# Patient Record
Sex: Male | Born: 1935 | Race: White | Hispanic: No | Marital: Married | Smoking: Former smoker
Health system: Southern US, Community
[De-identification: ages and names within clinical notes are randomized; demographics above are authoritative.]

## PROBLEM LIST (undated history)

## (undated) DIAGNOSIS — I499 Cardiac arrhythmia, unspecified: Secondary | ICD-10-CM

## (undated) DIAGNOSIS — M353 Polymyalgia rheumatica: Secondary | ICD-10-CM

## (undated) DIAGNOSIS — I509 Heart failure, unspecified: Secondary | ICD-10-CM

## (undated) DIAGNOSIS — J449 Chronic obstructive pulmonary disease, unspecified: Secondary | ICD-10-CM

## (undated) DIAGNOSIS — I2510 Atherosclerotic heart disease of native coronary artery without angina pectoris: Secondary | ICD-10-CM

## (undated) DIAGNOSIS — M1990 Unspecified osteoarthritis, unspecified site: Secondary | ICD-10-CM

## (undated) DIAGNOSIS — I10 Essential (primary) hypertension: Secondary | ICD-10-CM

## (undated) DIAGNOSIS — I209 Angina pectoris, unspecified: Secondary | ICD-10-CM

## (undated) DIAGNOSIS — R0600 Dyspnea, unspecified: Secondary | ICD-10-CM

## (undated) DIAGNOSIS — M4800 Spinal stenosis, site unspecified: Secondary | ICD-10-CM

## (undated) DIAGNOSIS — D649 Anemia, unspecified: Secondary | ICD-10-CM

## (undated) DIAGNOSIS — N189 Chronic kidney disease, unspecified: Secondary | ICD-10-CM

## (undated) HISTORY — PX: TONSILLECTOMY: SUR1361

## (undated) HISTORY — PX: EYE SURGERY: SHX253

## (undated) HISTORY — DX: Cardiac arrhythmia, unspecified: I49.9

## (undated) HISTORY — PX: VASECTOMY: SHX75

---

## 2002-07-27 HISTORY — PX: JOINT REPLACEMENT: SHX530

## 2005-07-27 HISTORY — PX: JOINT REPLACEMENT: SHX530

## 2010-01-24 ENCOUNTER — Ambulatory Visit: Payer: Self-pay | Admitting: Orthopedic Surgery

## 2010-03-11 ENCOUNTER — Ambulatory Visit: Payer: Self-pay | Admitting: Orthopedic Surgery

## 2010-12-30 ENCOUNTER — Ambulatory Visit: Payer: Self-pay | Admitting: Gastroenterology

## 2010-12-31 LAB — PATHOLOGY REPORT

## 2011-09-12 ENCOUNTER — Emergency Department: Payer: Self-pay | Admitting: Emergency Medicine

## 2013-01-26 ENCOUNTER — Ambulatory Visit: Payer: Self-pay | Admitting: Orthopedic Surgery

## 2016-05-22 ENCOUNTER — Encounter
Admission: RE | Disposition: A | Discharge status: Home / Self Care | Payer: Self-pay | Source: Ambulatory Visit | Attending: Internal Medicine

## 2016-05-22 ENCOUNTER — Ambulatory Visit
Admission: RE | Admit: 2016-05-22 | Discharge: 2016-05-22 | Disposition: A | Discharge status: Home / Self Care | Payer: Medicare PPO | Source: Ambulatory Visit | Attending: Internal Medicine | Admitting: Internal Medicine

## 2016-05-22 ENCOUNTER — Ambulatory Visit: Admit: 2016-05-22 | Payer: Medicare PPO | Admitting: Internal Medicine

## 2016-05-22 DIAGNOSIS — Z7982 Long term (current) use of aspirin: Secondary | ICD-10-CM | POA: Insufficient documentation

## 2016-05-22 DIAGNOSIS — I2510 Atherosclerotic heart disease of native coronary artery without angina pectoris: Secondary | ICD-10-CM | POA: Diagnosis present

## 2016-05-22 DIAGNOSIS — I25110 Atherosclerotic heart disease of native coronary artery with unstable angina pectoris: Secondary | ICD-10-CM | POA: Insufficient documentation

## 2016-05-22 DIAGNOSIS — M353 Polymyalgia rheumatica: Secondary | ICD-10-CM | POA: Insufficient documentation

## 2016-05-22 DIAGNOSIS — E785 Hyperlipidemia, unspecified: Secondary | ICD-10-CM | POA: Insufficient documentation

## 2016-05-22 DIAGNOSIS — Z79899 Other long term (current) drug therapy: Secondary | ICD-10-CM | POA: Insufficient documentation

## 2016-05-22 DIAGNOSIS — Z96611 Presence of right artificial shoulder joint: Secondary | ICD-10-CM | POA: Diagnosis not present

## 2016-05-22 DIAGNOSIS — N189 Chronic kidney disease, unspecified: Secondary | ICD-10-CM | POA: Diagnosis not present

## 2016-05-22 DIAGNOSIS — Z8249 Family history of ischemic heart disease and other diseases of the circulatory system: Secondary | ICD-10-CM | POA: Diagnosis not present

## 2016-05-22 DIAGNOSIS — Z87891 Personal history of nicotine dependence: Secondary | ICD-10-CM | POA: Insufficient documentation

## 2016-05-22 DIAGNOSIS — I129 Hypertensive chronic kidney disease with stage 1 through stage 4 chronic kidney disease, or unspecified chronic kidney disease: Secondary | ICD-10-CM | POA: Diagnosis not present

## 2016-05-22 DIAGNOSIS — N400 Enlarged prostate without lower urinary tract symptoms: Secondary | ICD-10-CM | POA: Insufficient documentation

## 2016-05-22 HISTORY — DX: Chronic kidney disease, unspecified: N18.9

## 2016-05-22 HISTORY — DX: Angina pectoris, unspecified: I20.9

## 2016-05-22 HISTORY — PX: CARDIAC CATHETERIZATION: SHX172

## 2016-05-22 HISTORY — DX: Essential (primary) hypertension: I10

## 2016-05-22 HISTORY — DX: Atherosclerotic heart disease of native coronary artery without angina pectoris: I25.10

## 2016-05-22 LAB — CARDIAC CATHETERIZATION: Cath EF Quantitative: 60 %

## 2016-05-22 SURGERY — LEFT HEART CATH AND CORONARY ANGIOGRAPHY
Anesthesia: Moderate Sedation

## 2016-05-22 SURGERY — LEFT HEART CATH AND CORONARY ANGIOGRAPHY
Anesthesia: Moderate Sedation | Laterality: Left

## 2016-05-22 MED ORDER — SODIUM CHLORIDE 0.9 % IV SOLN: 250.0000 mL | INTRAVENOUS | Status: DC | PRN

## 2016-05-22 MED ORDER — ASPIRIN 81 MG PO CHEW: 81.0000 mg | CHEWABLE_TABLET | ORAL | Status: DC

## 2016-05-22 MED ORDER — SODIUM CHLORIDE 0.9 % WEIGHT BASED INFUSION: 3.0000 mL/kg/h | INTRAVENOUS | Status: DC

## 2016-05-22 MED ORDER — MIDAZOLAM HCL 2 MG/2ML IJ SOLN
INTRAMUSCULAR | Status: AC
  Filled 2016-05-22: qty 2

## 2016-05-22 MED ORDER — FENTANYL CITRATE (PF) 100 MCG/2ML IJ SOLN
INTRAMUSCULAR | Status: AC
  Filled 2016-05-22: qty 2

## 2016-05-22 MED ORDER — SODIUM CHLORIDE 0.9% FLUSH: 3.0000 mL | INTRAVENOUS | Status: DC | PRN

## 2016-05-22 MED ORDER — MIDAZOLAM HCL 2 MG/2ML IJ SOLN
INTRAMUSCULAR | Status: DC | PRN
  Administered 2016-05-22: 1 mg via INTRAVENOUS

## 2016-05-22 MED ORDER — ACETAMINOPHEN 325 MG PO TABS: 650.0000 mg | ORAL_TABLET | ORAL | Status: DC | PRN

## 2016-05-22 MED ORDER — ONDANSETRON HCL 4 MG/2ML IJ SOLN: 4.0000 mg | Freq: Four times a day (QID) | INTRAMUSCULAR | Status: DC | PRN

## 2016-05-22 MED ORDER — IOPAMIDOL (ISOVUE-300) INJECTION 61%
INTRAVENOUS | Status: DC | PRN
  Administered 2016-05-22: 130 mL via INTRA_ARTERIAL

## 2016-05-22 MED ORDER — HEPARIN (PORCINE) IN NACL 2-0.9 UNIT/ML-% IJ SOLN
INTRAMUSCULAR | Status: AC
Start: 2016-05-22 — End: 2016-05-22
  Filled 2016-05-22: qty 500

## 2016-05-22 MED ORDER — SODIUM CHLORIDE 0.9% FLUSH: 3.0000 mL | Freq: Two times a day (BID) | INTRAVENOUS | Status: DC

## 2016-05-22 MED ORDER — FENTANYL CITRATE (PF) 100 MCG/2ML IJ SOLN
INTRAMUSCULAR | Status: DC | PRN
  Administered 2016-05-22: 50 ug via INTRAVENOUS

## 2016-05-22 MED ORDER — SODIUM CHLORIDE 0.9 % WEIGHT BASED INFUSION: 1.0000 mL/kg/h | INTRAVENOUS | Status: DC

## 2016-05-22 SURGICAL SUPPLY — 10 items
CATH INFINITI 5FR ANG PIGTAIL (CATHETERS) ×3 IMPLANT
CATH INFINITI 5FR JL4 (CATHETERS) ×3 IMPLANT
CATH INFINITI JR4 5F (CATHETERS) ×3 IMPLANT
DEVICE CLOSURE MYNXGRIP 5F (Vascular Products) ×3 IMPLANT
DEVICE SAFEGUARD 24CM (GAUZE/BANDAGES/DRESSINGS) ×3 IMPLANT
KIT MANI 3VAL PERCEP (MISCELLANEOUS) ×3 IMPLANT
NEEDLE PERC 18GX7CM (NEEDLE) ×3 IMPLANT
PACK CARDIAC CATH (CUSTOM PROCEDURE TRAY) ×3 IMPLANT
SHEATH AVANTI 5FR X 11CM (SHEATH) ×3 IMPLANT
WIRE EMERALD 3MM-J .035X150CM (WIRE) ×3 IMPLANT

## 2016-05-22 NOTE — Discharge Instructions (Signed)

## 2016-05-25 ENCOUNTER — Encounter: Payer: Self-pay | Admitting: Internal Medicine

## 2016-06-16 ENCOUNTER — Other Ambulatory Visit: Payer: Self-pay | Admitting: Family Medicine

## 2016-06-16 DIAGNOSIS — R0789 Other chest pain: Secondary | ICD-10-CM

## 2016-06-30 ENCOUNTER — Ambulatory Visit: Payer: Medicare PPO

## 2017-07-12 ENCOUNTER — Other Ambulatory Visit: Payer: Self-pay | Admitting: Orthopedic Surgery

## 2017-07-12 DIAGNOSIS — S86892A Other injury of other muscle(s) and tendon(s) at lower leg level, left leg, initial encounter: Secondary | ICD-10-CM

## 2017-07-13 ENCOUNTER — Ambulatory Visit
Admission: RE | Admit: 2017-07-13 | Discharge: 2017-07-13 | Disposition: A | Discharge status: Home / Self Care | Payer: Medicare PPO | Source: Ambulatory Visit | Attending: Orthopedic Surgery | Admitting: Orthopedic Surgery

## 2017-07-13 DIAGNOSIS — S86892A Other injury of other muscle(s) and tendon(s) at lower leg level, left leg, initial encounter: Secondary | ICD-10-CM | POA: Diagnosis not present

## 2017-07-13 DIAGNOSIS — X58XXXA Exposure to other specified factors, initial encounter: Secondary | ICD-10-CM | POA: Insufficient documentation

## 2017-07-13 DIAGNOSIS — M25462 Effusion, left knee: Secondary | ICD-10-CM | POA: Diagnosis not present

## 2017-07-13 DIAGNOSIS — S76112A Strain of left quadriceps muscle, fascia and tendon, initial encounter: Secondary | ICD-10-CM | POA: Diagnosis not present

## 2017-07-13 DIAGNOSIS — M25562 Pain in left knee: Secondary | ICD-10-CM | POA: Diagnosis present

## 2017-07-14 ENCOUNTER — Other Ambulatory Visit: Payer: Self-pay

## 2017-07-14 ENCOUNTER — Encounter
Admission: RE | Admit: 2017-07-14 | Discharge: 2017-07-14 | Disposition: A | Discharge status: Home / Self Care | Payer: Medicare PPO | Source: Ambulatory Visit | Attending: Orthopedic Surgery | Admitting: Orthopedic Surgery

## 2017-07-14 DIAGNOSIS — G4730 Sleep apnea, unspecified: Secondary | ICD-10-CM | POA: Diagnosis not present

## 2017-07-14 DIAGNOSIS — G4700 Insomnia, unspecified: Secondary | ICD-10-CM | POA: Diagnosis not present

## 2017-07-14 DIAGNOSIS — Z79899 Other long term (current) drug therapy: Secondary | ICD-10-CM | POA: Diagnosis not present

## 2017-07-14 DIAGNOSIS — M1990 Unspecified osteoarthritis, unspecified site: Secondary | ICD-10-CM | POA: Diagnosis not present

## 2017-07-14 DIAGNOSIS — Z87891 Personal history of nicotine dependence: Secondary | ICD-10-CM | POA: Diagnosis not present

## 2017-07-14 DIAGNOSIS — N189 Chronic kidney disease, unspecified: Secondary | ICD-10-CM | POA: Diagnosis not present

## 2017-07-14 DIAGNOSIS — I509 Heart failure, unspecified: Secondary | ICD-10-CM | POA: Diagnosis not present

## 2017-07-14 DIAGNOSIS — Z7982 Long term (current) use of aspirin: Secondary | ICD-10-CM | POA: Diagnosis not present

## 2017-07-14 DIAGNOSIS — S76112A Strain of left quadriceps muscle, fascia and tendon, initial encounter: Secondary | ICD-10-CM | POA: Diagnosis present

## 2017-07-14 DIAGNOSIS — J449 Chronic obstructive pulmonary disease, unspecified: Secondary | ICD-10-CM | POA: Diagnosis not present

## 2017-07-14 DIAGNOSIS — E785 Hyperlipidemia, unspecified: Secondary | ICD-10-CM | POA: Diagnosis not present

## 2017-07-14 DIAGNOSIS — M353 Polymyalgia rheumatica: Secondary | ICD-10-CM | POA: Diagnosis not present

## 2017-07-14 DIAGNOSIS — I2510 Atherosclerotic heart disease of native coronary artery without angina pectoris: Secondary | ICD-10-CM | POA: Diagnosis not present

## 2017-07-14 DIAGNOSIS — N400 Enlarged prostate without lower urinary tract symptoms: Secondary | ICD-10-CM | POA: Diagnosis not present

## 2017-07-14 DIAGNOSIS — I130 Hypertensive heart and chronic kidney disease with heart failure and stage 1 through stage 4 chronic kidney disease, or unspecified chronic kidney disease: Secondary | ICD-10-CM | POA: Diagnosis not present

## 2017-07-14 DIAGNOSIS — W1809XA Striking against other object with subsequent fall, initial encounter: Secondary | ICD-10-CM | POA: Diagnosis not present

## 2017-07-14 DIAGNOSIS — Z888 Allergy status to other drugs, medicaments and biological substances status: Secondary | ICD-10-CM | POA: Diagnosis not present

## 2017-07-14 HISTORY — DX: Dyspnea, unspecified: R06.00

## 2017-07-14 HISTORY — DX: Heart failure, unspecified: I50.9

## 2017-07-14 HISTORY — DX: Unspecified osteoarthritis, unspecified site: M19.90

## 2017-07-14 HISTORY — DX: Anemia, unspecified: D64.9

## 2017-07-14 LAB — DIFFERENTIAL
BASOS PCT: 0 %
Basophils Absolute: 0 10*3/uL (ref 0–0.1)
EOS PCT: 0 %
Eosinophils Absolute: 0 10*3/uL (ref 0–0.7)
Lymphocytes Relative: 7 %
Lymphs Abs: 0.7 10*3/uL — ABNORMAL LOW (ref 1.0–3.6)
MONOS PCT: 5 %
Monocytes Absolute: 0.5 10*3/uL (ref 0.2–1.0)
Neutro Abs: 8.7 10*3/uL — ABNORMAL HIGH (ref 1.4–6.5)
Neutrophils Relative %: 88 %

## 2017-07-14 LAB — BASIC METABOLIC PANEL
Anion gap: 8 (ref 5–15)
BUN: 27 mg/dL — AB (ref 6–20)
CHLORIDE: 105 mmol/L (ref 101–111)
CO2: 25 mmol/L (ref 22–32)
CREATININE: 1.36 mg/dL — AB (ref 0.61–1.24)
Calcium: 9.3 mg/dL (ref 8.9–10.3)
GFR calc Af Amer: 52 mL/min — ABNORMAL LOW (ref >60)
GFR, EST NON AFRICAN AMERICAN: 45 mL/min — AB (ref >60)
GLUCOSE: 113 mg/dL — AB (ref 65–99)
Potassium: 4.8 mmol/L (ref 3.5–5.1)
SODIUM: 138 mmol/L (ref 135–145)

## 2017-07-14 LAB — CBC
HEMATOCRIT: 39.3 % — AB (ref 40.0–52.0)
Hemoglobin: 13.2 g/dL (ref 13.0–18.0)
MCH: 32 pg (ref 26.0–34.0)
MCHC: 33.7 g/dL (ref 32.0–36.0)
MCV: 94.9 fL (ref 80.0–100.0)
PLATELETS: 241 10*3/uL (ref 150–440)
RBC: 4.14 MIL/uL — ABNORMAL LOW (ref 4.40–5.90)
RDW: 13.8 % (ref 11.5–14.5)
WBC: 9.9 10*3/uL (ref 3.8–10.6)

## 2017-07-14 NOTE — Patient Instructions (Signed)
Your procedure is scheduled on: July 15, 2017 (Thursday ) Report to Same  Day Surgery. [facility] ) Second Floor ARRIVAL TIME 3:00 pm  Remember: Instructions that are not followed completely may result in serious medical risk, up to and including death, or upon the discretion of your surgeon and anesthesiologist your surgery may need to be rescheduled.     _X__ 1. Do not eat food after midnight the night before your procedure.                 No gum chewing or hard candies. You may drink clear liquids up to 2 hours                 before you are scheduled to arrive for your surgery- DO not drink clear                 liquids within 2 hours of the start of your surgery.                 Clear Liquids include:  water, apple juice without pulp, clear carbohydrate                 drink such as Clearfast of Gartorade, Black Coffee or Tea (Do not add                 anything to coffee or tea).     _X__ 2.  No Alcohol for 24 hours before or after surgery.   _X__ 3.  Do Not Smoke or use e-cigarettes For 24 Hours Prior to Your Surgery.                 Do not use any chewable tobacco products for at least 6 hours prior to                 surgery.  ____  4.  Bring all medications with you on the day of surgery if instructed.   __x__  5.  Notify your doctor if there is any change in your medical condition      (cold, fever, infections).     Do not wear jewelry, make-up, hairpins, clips or nail polish. Do not wear lotions, powders, or perfumes.  Do not shave 48 hours prior to surgery. Men may shave face and neck. Do not bring valuables to the hospital.    [facility] any belongings or valuables.  Contacts, dentures or bridgework may not be worn into surgery. Leave your suitcase in the car. After surgery it may be brought to your room. For patients admitted to the hospital, discharge time is determined by your treatment team.   Patients  discharged the day of surgery will not be allowed to drive home.   Please read over the following fact sheets that you were given:             __x__ Take these medicines the morning of surgery with A SIP OF WATER:    1. AMLODIPINE  2.   3.   4.  5.  6.  ____ Fleet Enema (as directed)   _X___ Use CHG Soap as directed  ____ Use inhalers on the day of surgery  ____ Stop metformin 2 days prior to surgery    ____ Take 1/2 of usual insulin dose the night before surgery. No insulin the morning          of surgery.   __X__ Stop Coumadin/Plavix/aspirin on (STOP ASPIRIN  TODAY )  __X__ Stop Anti-inflammatories on (NO ASPIRIN PRODUCTS )    __X__ Stop supplements until after surgery.  (STOP RED YEAST RICE TODAY )  ____ Bring C-Pap to the hospital.

## 2017-07-15 ENCOUNTER — Encounter
Admission: RE | Disposition: A | Discharge status: Home / Self Care | Payer: Self-pay | Source: Ambulatory Visit | Attending: Orthopedic Surgery

## 2017-07-15 ENCOUNTER — Ambulatory Visit: Payer: Medicare PPO | Admitting: Anesthesiology

## 2017-07-15 ENCOUNTER — Ambulatory Visit
Admission: RE | Admit: 2017-07-15 | Discharge: 2017-07-15 | Disposition: A | Discharge status: Home / Self Care | Payer: Medicare PPO | Source: Ambulatory Visit | Attending: Orthopedic Surgery | Admitting: Orthopedic Surgery

## 2017-07-15 ENCOUNTER — Encounter: Payer: Self-pay | Admitting: Anesthesiology

## 2017-07-15 DIAGNOSIS — M353 Polymyalgia rheumatica: Secondary | ICD-10-CM | POA: Insufficient documentation

## 2017-07-15 DIAGNOSIS — Z87891 Personal history of nicotine dependence: Secondary | ICD-10-CM | POA: Insufficient documentation

## 2017-07-15 DIAGNOSIS — M1990 Unspecified osteoarthritis, unspecified site: Secondary | ICD-10-CM | POA: Insufficient documentation

## 2017-07-15 DIAGNOSIS — Z7982 Long term (current) use of aspirin: Secondary | ICD-10-CM | POA: Insufficient documentation

## 2017-07-15 DIAGNOSIS — Z79899 Other long term (current) drug therapy: Secondary | ICD-10-CM | POA: Insufficient documentation

## 2017-07-15 DIAGNOSIS — W1809XA Striking against other object with subsequent fall, initial encounter: Secondary | ICD-10-CM | POA: Insufficient documentation

## 2017-07-15 DIAGNOSIS — N189 Chronic kidney disease, unspecified: Secondary | ICD-10-CM | POA: Insufficient documentation

## 2017-07-15 DIAGNOSIS — N400 Enlarged prostate without lower urinary tract symptoms: Secondary | ICD-10-CM | POA: Insufficient documentation

## 2017-07-15 DIAGNOSIS — E785 Hyperlipidemia, unspecified: Secondary | ICD-10-CM | POA: Insufficient documentation

## 2017-07-15 DIAGNOSIS — I509 Heart failure, unspecified: Secondary | ICD-10-CM | POA: Insufficient documentation

## 2017-07-15 DIAGNOSIS — S76112A Strain of left quadriceps muscle, fascia and tendon, initial encounter: Secondary | ICD-10-CM | POA: Diagnosis not present

## 2017-07-15 DIAGNOSIS — I130 Hypertensive heart and chronic kidney disease with heart failure and stage 1 through stage 4 chronic kidney disease, or unspecified chronic kidney disease: Secondary | ICD-10-CM | POA: Insufficient documentation

## 2017-07-15 DIAGNOSIS — Z888 Allergy status to other drugs, medicaments and biological substances status: Secondary | ICD-10-CM | POA: Insufficient documentation

## 2017-07-15 DIAGNOSIS — G4700 Insomnia, unspecified: Secondary | ICD-10-CM | POA: Insufficient documentation

## 2017-07-15 DIAGNOSIS — G4730 Sleep apnea, unspecified: Secondary | ICD-10-CM | POA: Insufficient documentation

## 2017-07-15 DIAGNOSIS — J449 Chronic obstructive pulmonary disease, unspecified: Secondary | ICD-10-CM | POA: Insufficient documentation

## 2017-07-15 DIAGNOSIS — I2510 Atherosclerotic heart disease of native coronary artery without angina pectoris: Secondary | ICD-10-CM | POA: Insufficient documentation

## 2017-07-15 HISTORY — PX: PATELLAR TENDON REPAIR: SHX737

## 2017-07-15 HISTORY — PX: QUADRICEPS TENDON REPAIR: SHX756

## 2017-07-15 SURGERY — REPAIR, TENDON, PATELLAR
Anesthesia: General | Laterality: Left

## 2017-07-15 MED ORDER — FENTANYL CITRATE (PF) 100 MCG/2ML IJ SOLN
INTRAMUSCULAR | Status: AC
  Administered 2017-07-15: 25 ug via INTRAVENOUS
  Filled 2017-07-15: qty 2

## 2017-07-15 MED ORDER — LIDOCAINE HCL (PF) 2 % IJ SOLN
INTRAMUSCULAR | Status: AC
  Filled 2017-07-15: qty 10

## 2017-07-15 MED ORDER — METHYLPREDNISOLONE SODIUM SUCC 125 MG IJ SOLR
40.0000 mg | Freq: Once | INTRAMUSCULAR | Status: AC
  Administered 2017-07-15: 40 mg via INTRAVENOUS

## 2017-07-15 MED ORDER — ONDANSETRON HCL 4 MG/2ML IJ SOLN: 4.0000 mg | Freq: Once | INTRAMUSCULAR | Status: DC | PRN

## 2017-07-15 MED ORDER — ACETAMINOPHEN 10 MG/ML IV SOLN
INTRAVENOUS | Status: AC
  Filled 2017-07-15: qty 100

## 2017-07-15 MED ORDER — BUPIVACAINE HCL (PF) 0.5 % IJ SOLN
INTRAMUSCULAR | Status: AC
Start: 2017-07-15 — End: 2017-07-15
  Filled 2017-07-15: qty 30

## 2017-07-15 MED ORDER — HYDROCODONE-ACETAMINOPHEN 5-325 MG PO TABS: 1.0000 | ORAL_TABLET | Freq: Four times a day (QID) | ORAL | 0 refills | Status: DC | PRN

## 2017-07-15 MED ORDER — NEOMYCIN-POLYMYXIN B GU 40-200000 IR SOLN
Status: DC | PRN
  Administered 2017-07-15: 4 mL

## 2017-07-15 MED ORDER — CEFAZOLIN SODIUM-DEXTROSE 2-4 GM/100ML-% IV SOLN
INTRAVENOUS | Status: AC
  Filled 2017-07-15: qty 100

## 2017-07-15 MED ORDER — BUPIVACAINE HCL (PF) 0.5 % IJ SOLN
INTRAMUSCULAR | Status: DC | PRN
  Administered 2017-07-15: 30 mL

## 2017-07-15 MED ORDER — PROPOFOL 10 MG/ML IV BOLUS
INTRAVENOUS | Status: DC | PRN
  Administered 2017-07-15: 120 mg via INTRAVENOUS

## 2017-07-15 MED ORDER — PROPOFOL 10 MG/ML IV BOLUS
INTRAVENOUS | Status: AC
  Filled 2017-07-15: qty 20

## 2017-07-15 MED ORDER — METHYLPREDNISOLONE SODIUM SUCC 125 MG IJ SOLR
INTRAMUSCULAR | Status: AC
  Filled 2017-07-15: qty 2

## 2017-07-15 MED ORDER — CEFAZOLIN SODIUM-DEXTROSE 2-4 GM/100ML-% IV SOLN
2.0000 g | Freq: Once | INTRAVENOUS | Status: AC
  Administered 2017-07-15: 2 g via INTRAVENOUS

## 2017-07-15 MED ORDER — FENTANYL CITRATE (PF) 100 MCG/2ML IJ SOLN
25.0000 ug | INTRAMUSCULAR | Status: AC | PRN
  Administered 2017-07-15 (×6): 25 ug via INTRAVENOUS

## 2017-07-15 MED ORDER — ONDANSETRON HCL 4 MG/2ML IJ SOLN
INTRAMUSCULAR | Status: AC
  Filled 2017-07-15: qty 2

## 2017-07-15 MED ORDER — LIDOCAINE HCL (CARDIAC) 20 MG/ML IV SOLN
INTRAVENOUS | Status: DC | PRN
  Administered 2017-07-15: 100 mg via INTRAVENOUS

## 2017-07-15 MED ORDER — ONDANSETRON HCL 4 MG/2ML IJ SOLN
INTRAMUSCULAR | Status: DC | PRN
  Administered 2017-07-15: 4 mg via INTRAVENOUS

## 2017-07-15 MED ORDER — NEOMYCIN-POLYMYXIN B GU 40-200000 IR SOLN
Status: AC
  Filled 2017-07-15: qty 4

## 2017-07-15 MED ORDER — FAMOTIDINE 20 MG PO TABS
ORAL_TABLET | ORAL | Status: AC
  Administered 2017-07-15: 20 mg via ORAL
  Filled 2017-07-15: qty 1

## 2017-07-15 MED ORDER — FENTANYL CITRATE (PF) 100 MCG/2ML IJ SOLN
INTRAMUSCULAR | Status: DC | PRN
  Administered 2017-07-15: 25 ug via INTRAVENOUS
  Administered 2017-07-15: 50 ug via INTRAVENOUS
  Administered 2017-07-15: 25 ug via INTRAVENOUS

## 2017-07-15 MED ORDER — FAMOTIDINE 20 MG PO TABS
20.0000 mg | ORAL_TABLET | Freq: Once | ORAL | Status: AC
  Administered 2017-07-15: 20 mg via ORAL

## 2017-07-15 MED ORDER — LACTATED RINGERS IV SOLN
INTRAVENOUS | Status: DC
  Administered 2017-07-15 (×2): via INTRAVENOUS

## 2017-07-15 MED ORDER — ACETAMINOPHEN 10 MG/ML IV SOLN
INTRAVENOUS | Status: DC | PRN
  Administered 2017-07-15: 1000 mg via INTRAVENOUS

## 2017-07-15 MED ORDER — PHENYLEPHRINE HCL 10 MG/ML IJ SOLN
INTRAMUSCULAR | Status: DC | PRN
  Administered 2017-07-15: 100 ug via INTRAVENOUS

## 2017-07-15 MED ORDER — FENTANYL CITRATE (PF) 100 MCG/2ML IJ SOLN
INTRAMUSCULAR | Status: AC
  Filled 2017-07-15: qty 2

## 2017-07-15 SURGICAL SUPPLY — 38 items
ANCHOR SUT BIO SW 4.75X19.1 (Anchor) ×9 IMPLANT
BANDAGE ELASTIC 6 LF NS (GAUZE/BANDAGES/DRESSINGS) ×3 IMPLANT
CANISTER SUCT 1200ML W/VALVE (MISCELLANEOUS) ×3 IMPLANT
CAST PADDING 6X4YD ST 30248 (SOFTGOODS) ×2
CHLORAPREP W/TINT 26ML (MISCELLANEOUS) ×3 IMPLANT
CUFF TOURN 24 STER (MISCELLANEOUS) IMPLANT
CUFF TOURN 30 STER DUAL PORT (MISCELLANEOUS) ×3 IMPLANT
ELECT CAUTERY BLADE 6.4 (BLADE) ×3 IMPLANT
ELECT REM PT RETURN 9FT ADLT (ELECTROSURGICAL) ×3
ELECTRODE REM PT RTRN 9FT ADLT (ELECTROSURGICAL) ×1 IMPLANT
GAUZE PETRO XEROFOAM 1X8 (MISCELLANEOUS) ×6 IMPLANT
GAUZE SPONGE 4X4 12PLY STRL (GAUZE/BANDAGES/DRESSINGS) ×3 IMPLANT
GLOVE SURG SYN 9.0  PF PI (GLOVE) ×2
GLOVE SURG SYN 9.0 PF PI (GLOVE) ×1 IMPLANT
GOWN SRG 2XL LVL 4 RGLN SLV (GOWNS) ×1 IMPLANT
GOWN STRL NON-REIN 2XL LVL4 (GOWNS) ×2
GOWN STRL REUS W/ TWL LRG LVL3 (GOWN DISPOSABLE) ×1 IMPLANT
GOWN STRL REUS W/TWL LRG LVL3 (GOWN DISPOSABLE) ×2
IMMOB KNEE 24 THIGH 24 443303 (SOFTGOODS) IMPLANT
KIT RM TURNOVER STRD PROC AR (KITS) ×3 IMPLANT
NS IRRIG 1000ML POUR BTL (IV SOLUTION) ×3 IMPLANT
NS IRRIG 500ML POUR BTL (IV SOLUTION) ×3 IMPLANT
PACK EXTREMITY ARMC (MISCELLANEOUS) ×3 IMPLANT
PACK TOTAL KNEE (MISCELLANEOUS) ×3 IMPLANT
PAD ABD DERMACEA PRESS 5X9 (GAUZE/BANDAGES/DRESSINGS) ×6 IMPLANT
PADDING CAST COTTON 6X4 ST (SOFTGOODS) ×1 IMPLANT
PASSER SUT SWANSON 36MM LOOP (INSTRUMENTS) ×3 IMPLANT
STAPLER SKIN PROX 35W (STAPLE) ×3 IMPLANT
SUT FIBERWIRE #2 38 T-5 BLUE (SUTURE) ×9
SUT VIC AB 0 CT1 27 (SUTURE) ×2
SUT VIC AB 0 CT1 27XCR 8 STRN (SUTURE) ×1 IMPLANT
SUT VIC AB 0 CT1 36 (SUTURE) ×3 IMPLANT
SUT VIC AB 2-0 CT1 27 (SUTURE) ×2
SUT VIC AB 2-0 CT1 TAPERPNT 27 (SUTURE) ×1 IMPLANT
SUTURE FIBERWR #2 38 T-5 BLUE (SUTURE) ×3 IMPLANT
SYR 10ML LL (SYRINGE) ×3 IMPLANT
SYS INTERNAL BRACE KNEE (Miscellaneous) ×3 IMPLANT
SYSTEM INTERNAL BRACE KNEE (Miscellaneous) ×1 IMPLANT

## 2017-07-15 NOTE — H&P (Signed)
Reviewed paper H+P, will be scanned into chart. No changes noted.  

## 2017-07-15 NOTE — Discharge Instructions (Addendum)
AMBULATORY SURGERY  DISCHARGE INSTRUCTIONS   1) The drugs that you were given will stay in your system until tomorrow so for the next 24 hours you should not:  A) Drive an automobile B) Make any legal decisions C) Drink any alcoholic beverage   2) You may resume regular meals tomorrow.  Today it is better to start with liquids and gradually work up to solid foods.  You may eat anything you prefer, but it is better to start with liquids, then soup and crackers, and gradually work up to solid foods.   3) Please notify your doctor immediately if you have any unusual bleeding, trouble breathing, redness and pain at the surgery site, drainage, fever, or pain not relieved by medication. 4)   5) Your post-operative visit with Dr.                                     is: Date:                        Time:    Please call to schedule your post-operative visit.  6) Additional Instructions:     Resume home medicines tonight. Pain medicine as directed. Weightbearing as tolerated with immobilizer on. Okay to loosen immobilizer when you are resting with her leg out straight always have it on when getting up and do not been the knee at this time.

## 2017-07-15 NOTE — Transfer of Care (Signed)
Immediate Anesthesia Transfer of Care Note  Patient: Caleb Khan  Procedure(s) Performed: PATELLA TENDON REPAIR (Left ) REPAIR QUADRICEP TENDON (Left )  Patient Location: PACU  Anesthesia Type:General  Level of Consciousness: sedated  Airway & Oxygen Therapy: Patient Spontanous Breathing and Patient connected to face mask oxygen  Post-op Assessment: Report given to RN  Post vital signs: Reviewed and stable  Last Vitals:  Vitals:   07/15/17 1424 07/15/17 1645  BP: (!) 184/70 112/62  Pulse: 67 (!) 50  Resp: 17 13  Temp: 36.6 C 37.1 C  SpO2: 98% 98%    Last Pain:  Vitals:   07/15/17 1424  TempSrc: Tympanic      Patients Stated Pain Goal: 2 (07/15/17 1424)  Complications: No apparent anesthesia complications

## 2017-07-15 NOTE — Op Note (Signed)
07/15/2017  4:51 PM  PATIENT:  Caleb Khan  83 y.o. male  PRE-OPERATIVE DIAGNOSIS:  PATELLAR TENDON and quadriceps tendon ruptures left knee  POST-OPERATIVE DIAGNOSIS:  PATELLAR TENDON and quadriceps tendon ruptures left knee  PROCEDURE:  Procedure(s): PATELLA TENDON REPAIR (Left) REPAIR QUADRICEP TENDON (Left)  SURGEON: Leitha SchullerMichael J Jaquane Boughner, MD  ASSISTANTS: None  ANESTHESIA:   general  EBL:  Total I/O In: 900 [I.V.:900] Out: 50 [Blood:50]  BLOOD ADMINISTERED:none  DRAINS: none   LOCAL MEDICATIONS USED:  MARCAINE     SPECIMEN:  No Specimen  DISPOSITION OF SPECIMEN:  N/A  COUNTS:  YES  TOURNIQUET:  * Missing tourniquet times found for documented tourniquets in log: [id number] *  IMPLANTS: Multiple swivel lock bone anchors with suture  DICTATION: .Dragon Dictation patient was brought to the operating room and after adequate anesthesia was obtained the left leg was prepped and draped in sterile fashion. After patient identification and timeout procedures were completed a midline skin incision was made and there was a tear of the patella tendon with communication with the joint as well as a quadriceps tendon rupture of almost the entire quadriceps tendon. The sepsis insertion had a few fibers left on the bone and these were debrided to roughen the bone surface for subsequent repair. #2 FiberWire was then placed in a Krakw type suture medially and laterally into suture anchors were placed drilling tapping and then placing the swivel lock anchors. The quadriceps back and community into contact with the proximal patella. Following this examination the patella tendon revealed laterally rupture with exposed bone and this was repaired with a single suture anchor in the inferior lateral portion of the patella drilling tapping and the placed the swivel lock with suture woven through the patellar tendon. For additional fixation and stability and internal brace using a fiber tape was sutured  proximally in the quadriceps tendon and then the 2 tendons brought laterally around the patella and 2 suture anchors were then placed in the proximal tibia drilling and then tapping them placing the sutures into the bone with the swivel locks this gave additional fixation and help protect both quadriceps and patellar tendon repairs the knee could be flexed to 90 without difficulty and all sutures remain in place this point sutures were cut short so for the proximal sutures which was woven through some of the more superficial tendon to cover the defect from where the ruptured been. The wound was then thoroughly irrigated and infiltrated with 30 cc of Sensorcaine for postop analgesia. 2-0 Vicryl subcutaneously followed by skin staples to close the skin followed by dressing of Xeroform 4 x 4's ABDs and web roll Ace wrap and knee immobilizer  PLAN OF CARE: Discharge to home after PACU  PATIENT DISPOSITION:  PACU - hemodynamically stable.

## 2017-07-15 NOTE — Anesthesia Preprocedure Evaluation (Addendum)
Anesthesia Evaluation  Patient identified by MRN, date of birth, ID band Patient awake    Airway Mallampati: II       Dental   Pulmonary shortness of breath and with exertion, former smoker,           Cardiovascular hypertension, Pt. on medications + angina with exertion + CAD and +CHF       Neuro/Psych negative neurological ROS     GI/Hepatic negative GI ROS, Neg liver ROS,   Endo/Other  negative endocrine ROS  Renal/GU Renal disease  negative genitourinary   Musculoskeletal  (+) Arthritis , Osteoarthritis,    Abdominal   Peds negative pediatric ROS (+)  Hematology  (+) anemia ,   Anesthesia Other Findings Past Medical History: No date: Anemia No date: Anginal pain (HCC) No date: Arthritis     Comment:  osteoarthritis No date: CHF (congestive heart failure) (HCC)  Polymyalgia rheumatica EF 60% No date: Chronic kidney disease No date: Coronary artery disease No date: Dyspnea     Comment:  with exertion No date: Hypertension  Reproductive/Obstetrics                           Anesthesia Physical Anesthesia Plan  ASA: III  Anesthesia Plan: General   Post-op Pain Management:    Induction: Intravenous  PONV Risk Score and Plan:   Airway Management Planned: LMA  Additional Equipment:   Intra-op Plan:   Post-operative Plan:   Informed Consent: I have reviewed the patients History and Physical, chart, labs and discussed the procedure including the risks, benefits and alternatives for the proposed anesthesia with the patient or authorized representative who has indicated his/her understanding and acceptance.   Dental advisory given  Plan Discussed with: CRNA and Surgeon  Anesthesia Plan Comments:        Anesthesia Quick Evaluation

## 2017-07-15 NOTE — Anesthesia Post-op Follow-up Note (Signed)
Anesthesia QCDR form completed.        

## 2017-07-15 NOTE — Anesthesia Procedure Notes (Signed)
Procedure Name: LMA Insertion Date/Time: 07/15/2017 3:34 PM Performed by: Junious SilkNoles, Benoit Meech, CRNA Pre-anesthesia Checklist: Patient identified, Patient being monitored, Timeout performed, Emergency Drugs available and Suction available Patient Re-evaluated:Patient Re-evaluated prior to induction Oxygen Delivery Method: Circle system utilized Preoxygenation: Pre-oxygenation with 100% oxygen Induction Type: IV induction Ventilation: Mask ventilation without difficulty LMA: LMA inserted LMA Size: 4.0 Tube type: Oral Number of attempts: 1 Placement Confirmation: positive ETCO2 and breath sounds checked- equal and bilateral Tube secured with: Tape Dental Injury: Teeth and Oropharynx as per pre-operative assessment

## 2017-07-15 NOTE — Anesthesia Postprocedure Evaluation (Signed)
Anesthesia Post Note  Patient: Caleb Khan  Procedure(s) Performed: PATELLA TENDON REPAIR (Left ) REPAIR QUADRICEP TENDON (Left )  Patient location during evaluation: PACU Anesthesia Type: General Level of consciousness: awake and alert Pain management: pain level controlled Vital Signs Assessment: post-procedure vital signs reviewed and stable Respiratory status: spontaneous breathing and respiratory function stable Cardiovascular status: stable Anesthetic complications: no     Last Vitals:  Vitals:   07/15/17 1823 07/15/17 1824  BP: 136/72   Pulse: 64   Resp: 16   Temp:    SpO2: 95% 98%    Last Pain:  Vitals:   07/15/17 1823  TempSrc:   PainSc: 2                  KEPHART,WILLIAM K

## 2017-07-16 ENCOUNTER — Encounter: Payer: Self-pay | Admitting: Orthopedic Surgery

## 2017-11-30 ENCOUNTER — Other Ambulatory Visit: Payer: Self-pay

## 2017-11-30 ENCOUNTER — Encounter: Payer: Medicare PPO | Attending: Specialist

## 2017-11-30 VITALS — Ht 65.5 in | Wt 161.7 lb

## 2017-11-30 DIAGNOSIS — J449 Chronic obstructive pulmonary disease, unspecified: Secondary | ICD-10-CM | POA: Diagnosis present

## 2017-11-30 NOTE — Progress Notes (Signed)
Pulmonary Individual Treatment Plan  Patient Details  Name: Caleb Khan MRN: [id number] Date of Birth: [birth year] Referring Provider:     Pulmonary Rehab from 11/30/2017 in [facility] Cardiac and Pulmonary Rehab  Referring Provider  [name]      Initial Encounter Date:    Pulmonary Rehab from 11/30/2017 in [facility] Cardiac and Pulmonary Rehab  Date  11/30/17  Referring Provider  [name]      Visit Diagnosis: Chronic obstructive pulmonary disease, unspecified COPD type ([place])  Patient's Home Medications on Admission:  Current Outpatient Medications:  .  amLODipine (NORVASC) 5 MG tablet, Take 5 mg by mouth 2 (two) times daily. , Disp: , Rfl:  .  aspirin EC 81 MG tablet, Take 81 mg by mouth daily., Disp: , Rfl:  .  CALCIUM-VITAMIN D PO, Take 1 tablet by mouth daily., Disp: , Rfl:  .  docusate sodium (COLACE) 100 MG capsule, Take 200 mg by mouth daily as needed for mild constipation., Disp: , Rfl:  .  HYDROcodone-acetaminophen (NORCO) 5-325 MG tablet, Take 1-2 tablets by mouth every 6 (six) hours as needed for moderate pain., Disp: 30 tablet, Rfl: 0 .  losartan (COZAAR) 25 MG tablet, Take 25 mg by mouth daily., Disp: , Rfl:  .  Multiple Vitamins-Minerals (MULTIVITAMIN PO), Take 1 tablet by mouth daily., Disp: , Rfl:  .  nitroGLYCERIN (NITROSTAT) 0.4 MG SL tablet, Place 0.4 mg under the tongue every 5 (five) minutes as needed for chest pain., Disp: , Rfl:  .  predniSONE (DELTASONE) 10 MG tablet, Take 10 mg by mouth daily with breakfast., Disp: , Rfl:  .  Red Yeast Rice Extract (RED YEAST RICE PO), Take 1 capsule by mouth 2 (two) times daily. , Disp: , Rfl:  .  terazosin (HYTRIN) 10 MG capsule, Take 10 mg by mouth at bedtime., Disp: , Rfl:   Past Medical History: Past Medical History:  Diagnosis Date  . Anemia   . Anginal pain ([place])   . Arthritis    osteoarthritis  . CHF (congestive heart failure) ([place])   . Chronic kidney disease   . Coronary artery disease   . Dyspnea    with exertion  .  Hypertension     Tobacco Use: Social History   Tobacco Use  Smoking Status Former Smoker  . Packs/day: 2.00  . Years: 22.00  . Pack years: 44.00  . Types: Cigarettes  . Last attempt to quit: 05/22/1972  . Years since quitting: 45.5  Smokeless Tobacco Never Used    Labs: Recent Review Flowsheet Data    There is no flowsheet data to display.       Pulmonary Assessment Scores: Pulmonary Assessment Scores    Row Name 11/30/17 1106         ADL UCSD   ADL Phase  Entry     SOB Score total  33     Rest  0     Walk  2     Stairs  4     Bath  0     Dress  1     Shop  2       CAT Score   CAT Score  17       mMRC Score   mMRC Score  1        Pulmonary Function Assessment: Pulmonary Function Assessment - 11/30/17 1124      Initial Spirometry Results   FVC%  134 %    FEV1%  152 %  FEV1/FVC Ratio  76.13    Comments  best of 2 good patient effort      Post Bronchodilator Spirometry Results   FVC%  129.98 %    FEV1%  155.04 %    FEV1/FVC Ratio  80.41    Comments  best of 2 good patient effort      Breath   Bilateral Breath Sounds  Clear    Shortness of Breath  Yes;Limiting activity       Exercise Target Goals: Date: 11/30/17  Exercise Program Goal: Individual exercise prescription set using results from initial 6 min walk test and THRR while considering  patient's activity barriers and safety.    Exercise Prescription Goal: Initial exercise prescription builds to 30-45 minutes a day of aerobic activity, 2-3 days per week.  Home exercise guidelines will be given to patient during program as part of exercise prescription that the participant will acknowledge.  Activity Barriers & Risk Stratification:   6 Minute Walk: 6 Minute Walk    Row Name 11/30/17 1229         6 Minute Walk   Distance  1360 feet     Walk Time  6 minutes     # of Rest Breaks  0     MPH  2.58     METS  2.19     RPE  13     Perceived Dyspnea   2     VO2 Peak  7.66      Symptoms  No     Resting HR  64 bpm     Resting BP  120/54     Resting Oxygen Saturation   97 %     Exercise Oxygen Saturation  during 6 min walk  94 %     Max Ex. HR  108 bpm     Max Ex. BP  132/58     2 Minute Post BP  126/58       Interval HR   1 Minute HR  70     2 Minute HR  78     3 Minute HR  77     4 Minute HR  92     5 Minute HR  108     6 Minute HR  103     2 Minute Post HR  80     Interval Heart Rate?  Yes       Interval Oxygen   Interval Oxygen?  Yes     Baseline Oxygen Saturation %  97 %     1 Minute Oxygen Saturation %  95 %     1 Minute Liters of Oxygen  0 L     2 Minute Oxygen Saturation %  94 %     2 Minute Liters of Oxygen  0 L     3 Minute Oxygen Saturation %  94 %     3 Minute Liters of Oxygen  0 L     4 Minute Oxygen Saturation %  94 %     4 Minute Liters of Oxygen  0 L     5 Minute Oxygen Saturation %  94 %     5 Minute Liters of Oxygen  0 L     6 Minute Oxygen Saturation %  95 %     6 Minute Liters of Oxygen  0 L     2 Minute Post Oxygen Saturation %  97 %     2 Minute Post  Liters of Oxygen  0 L       Oxygen Initial Assessment: Oxygen Initial Assessment - 11/30/17 1121      Home Oxygen   Home Oxygen Device  Home Concentrator    Sleep Oxygen Prescription  Continuous    Liters per minute  2    Home Exercise Oxygen Prescription  None    Home at Rest Exercise Oxygen Prescription  None    Compliance with Home Oxygen Use  Yes      Initial 6 min Walk   Oxygen Used  None      Program Oxygen Prescription   Program Oxygen Prescription  None      Intervention   Short Term Goals  To learn and understand importance of monitoring SPO2 with pulse oximeter and demonstrate accurate use of the pulse oximeter.;To learn and understand importance of maintaining oxygen saturations>88%;To learn and demonstrate proper pursed lip breathing techniques or other breathing techniques.;To learn and exhibit compliance with exercise, home and travel O2 prescription     Long  Term Goals  Exhibits compliance with exercise, home and travel O2 prescription;Verbalizes importance of monitoring SPO2 with pulse oximeter and return demonstration;Maintenance of O2 saturations>88%;Exhibits proper breathing techniques, such as pursed lip breathing or other method taught during program session       Oxygen Re-Evaluation:   Oxygen Discharge (Final Oxygen Re-Evaluation):   Initial Exercise Prescription: Initial Exercise Prescription - 11/30/17 1200      Date of Initial Exercise RX and Referring Provider   Date  11/30/17    Referring Provider  [name]      Treadmill   MPH  2    Grade  0.5    Minutes  15    METs  2.6      Recumbant Bike   Level  2    RPM  60    Watts  10    Minutes  15    METs  2.4      REL-XR   Level  2    Speed  50    Minutes  15    METs  2.4      T5 Nustep   Level  2    SPM  80    Minutes  15    METs  2.4      Prescription Details   Frequency (times per week)  3    Duration  Progress to 45 minutes of aerobic exercise without signs/symptoms of physical distress      Intensity   THRR 40-80% of Max Heartrate  92-119    Ratings of Perceived Exertion  11-15    Perceived Dyspnea  0-4      Resistance Training   Training Prescription  Yes    Weight  4 lb    Reps  10-15       Perform Capillary Blood Glucose checks as needed.  Exercise Prescription Changes:   Exercise Comments:   Exercise Goals and Review: Exercise Goals    Row Name 11/30/17 1228             Exercise Goals   Increase Physical Activity  Yes       Intervention  Provide advice, education, support and counseling about physical activity/exercise needs.;Develop an individualized exercise prescription for aerobic and resistive training based on initial evaluation findings, risk stratification, comorbidities and participant's personal goals.       Expected Outcomes  Short Term: Attend rehab on a regular basis to increase amount  of physical  activity.;Long Term: Add in home exercise to make exercise part of routine and to increase amount of physical activity.;Long Term: Exercising regularly at least 3-5 days a week.       Increase Strength and Stamina  Yes       Intervention  Provide advice, education, support and counseling about physical activity/exercise needs.;Develop an individualized exercise prescription for aerobic and resistive training based on initial evaluation findings, risk stratification, comorbidities and participant's personal goals.       Expected Outcomes  Short Term: Increase workloads from initial exercise prescription for resistance, speed, and METs.;Short Term: Perform resistance training exercises routinely during rehab and add in resistance training at home;Long Term: Improve cardiorespiratory fitness, muscular endurance and strength as measured by increased METs and functional capacity (6MWT)       Able to understand and use rate of perceived exertion (RPE) scale  Yes       Intervention  Provide education and explanation on how to use RPE scale       Expected Outcomes  Short Term: Able to use RPE daily in rehab to express subjective intensity level;Long Term:  Able to use RPE to guide intensity level when exercising independently       Able to understand and use Dyspnea scale  Yes       Intervention  Provide education and explanation on how to use Dyspnea scale       Expected Outcomes  Short Term: Able to use Dyspnea scale daily in rehab to express subjective sense of shortness of breath during exertion;Long Term: Able to use Dyspnea scale to guide intensity level when exercising independently       Knowledge and understanding of Target Heart Rate Range (THRR)  Yes       Intervention  Provide education and explanation of THRR including how the numbers were predicted and where they are located for reference       Expected Outcomes  Short Term: Able to state/look up THRR;Short Term: Able to use daily as guideline for  intensity in rehab;Long Term: Able to use THRR to govern intensity when exercising independently       Able to check pulse independently  Yes       Intervention  Provide education and demonstration on how to check pulse in carotid and radial arteries.;Review the importance of being able to check your own pulse for safety during independent exercise       Expected Outcomes  Short Term: Able to explain why pulse checking is important during independent exercise;Long Term: Able to check pulse independently and accurately       Understanding of Exercise Prescription  Yes       Intervention  Provide education, explanation, and written materials on patient's individual exercise prescription       Expected Outcomes  Short Term: Able to explain program exercise prescription;Long Term: Able to explain home exercise prescription to exercise independently          Exercise Goals Re-Evaluation :   Discharge Exercise Prescription (Final Exercise Prescription Changes):   Nutrition:  Target Goals: Understanding of nutrition guidelines, daily intake of sodium <1596m, cholesterol <2088m calories 30% from fat and 7% or less from saturated fats, daily to have 5 or more servings of fruits and vegetables.  Biometrics: Pre Biometrics - 11/30/17 1228      Pre Biometrics   Height  5' 5.5" (1.664 m)    Weight  161 lb 11.2 oz (73.3 kg)  Waist Circumference  36.5 inches    Hip Circumference  38.5 inches    Waist to Hip Ratio  0.95 %    BMI (Calculated)  26.49        Nutrition Therapy Plan and Nutrition Goals: Nutrition Therapy & Goals - 11/30/17 1119      Personal Nutrition Goals   Comments  Lose a little weight, Learn how to eat healthier      Intervention Plan   Intervention  Prescribe, educate and counsel regarding individualized specific dietary modifications aiming towards targeted core components such as weight, hypertension, lipid management, diabetes, heart failure and other  comorbidities.;Nutrition handout(s) given to patient.    Expected Outcomes  Short Term Goal: Understand basic principles of dietary content, such as calories, fat, sodium, cholesterol and nutrients.;Long Term Goal: Adherence to prescribed nutrition plan.       Nutrition Assessments: Nutrition Assessments - 11/30/17 1106      MEDFICTS Scores   Pre Score  49       Nutrition Goals Re-Evaluation:   Nutrition Goals Discharge (Final Nutrition Goals Re-Evaluation):   Psychosocial: Target Goals: Acknowledge presence or absence of significant depression and/or stress, maximize coping skills, provide positive support system. Participant is able to verbalize types and ability to use techniques and skills needed for reducing stress and depression.   Initial Review & Psychosocial Screening: Initial Psych Review & Screening - 11/30/17 1117      Initial Review   Current issues with  Current Stress Concerns    Source of Stress Concerns  Chronic Illness    Comments  He states his COPD is is the main thing that stresses him out.      Family Dynamics   Good Support System?  Yes    Comments  He can look to his wife and daughter for support      Barriers   Psychosocial barriers to participate in program  There are no identifiable barriers or psychosocial needs.;The patient should benefit from training in stress management and relaxation.      Screening Interventions   Interventions  Provide feedback about the scores to participant;Encouraged to exercise;Program counselor consult;To provide support and resources with identified psychosocial needs    Expected Outcomes  Short Term goal: Utilizing psychosocial counselor, staff and physician to assist with identification of specific Stressors or current issues interfering with healing process. Setting desired goal for each stressor or current issue identified.;Long Term Goal: Stressors or current issues are controlled or eliminated.;Short Term goal:  Identification and review with participant of any Quality of Life or Depression concerns found by scoring the questionnaire.;Long Term goal: The participant improves quality of Life and PHQ9 Scores as seen by post scores and/or verbalization of changes       Quality of Life Scores:  Scores of 19 and below usually indicate a poorer quality of life in these areas.  A difference of  2-3 points is a clinically meaningful difference.  A difference of 2-3 points in the total score of the Quality of Life Index has been associated with significant improvement in overall quality of life, self-image, physical symptoms, and general health in studies assessing change in quality of life.  PHQ-9: Recent Review Flowsheet Data    Depression screen [facility] 2/9 11/30/2017   Decreased Interest 0   Down, Depressed, Hopeless 0   PHQ - 2 Score 0   Altered sleeping 0   Tired, decreased energy 1   Change in appetite 0   Feeling bad  or failure about yourself  1   Trouble concentrating 0   Moving slowly or fidgety/restless 0   Suicidal thoughts 0   PHQ-9 Score 2   Difficult doing work/chores Somewhat difficult     Interpretation of Total Score  Total Score Depression Severity:  1-4 = Minimal depression, 5-9 = Mild depression, 10-14 = Moderate depression, 15-19 = Moderately severe depression, 20-27 = Severe depression   Psychosocial Evaluation and Intervention:   Psychosocial Re-Evaluation:   Psychosocial Discharge (Final Psychosocial Re-Evaluation):   Education: Education Goals: Education classes will be provided on a weekly basis, covering required topics. Participant will state understanding/return demonstration of topics presented.  Learning Barriers/Preferences: Learning Barriers/Preferences - 11/30/17 1120      Learning Barriers/Preferences   Learning Barriers  Sight wears glasses    Learning Preferences  None       Education Topics:  Initial Evaluation Education: - Verbal, written and  demonstration of respiratory meds, oximetry and breathing techniques. Instruction on use of nebulizers and MDIs and importance of monitoring MDI activations.   Pulmonary Rehab from 11/30/2017 in [facility] Cardiac and Pulmonary Rehab  Date  11/30/17  Educator  [facility]  Instruction Review Code  1- Verbalizes Understanding      General Nutrition Guidelines/Fats and Fiber: -Group instruction provided by verbal, written material, models and posters to present the general guidelines for heart healthy nutrition. Gives an explanation and review of dietary fats and fiber.   Controlling Sodium/Reading Food Labels: -Group verbal and written material supporting the discussion of sodium use in heart healthy nutrition. Review and explanation with models, verbal and written materials for utilization of the food label.   Exercise Physiology & General Exercise Guidelines: - Group verbal and written instruction with models to review the exercise physiology of the cardiovascular system and associated critical values. Provides general exercise guidelines with specific guidelines to those with heart or lung disease.    Aerobic Exercise & Resistance Training: - Gives group verbal and written instruction on the various components of exercise. Focuses on aerobic and resistive training programs and the benefits of this training and how to safely progress through these programs.   Flexibility, Balance, Mind/Body Relaxation: Provides group verbal/written instruction on the benefits of flexibility and balance training, including mind/body exercise modes such as yoga, pilates and tai chi.  Demonstration and skill practice provided.   Stress and Anxiety: - Provides group verbal and written instruction about the health risks of elevated stress and causes of high stress.  Discuss the correlation between heart/lung disease and anxiety and treatment options. Review healthy ways to manage with stress and anxiety.   Depression: -  Provides group verbal and written instruction on the correlation between heart/lung disease and depressed mood, treatment options, and the stigmas associated with seeking treatment.   Exercise & Equipment Safety: - Individual verbal instruction and demonstration of equipment use and safety with use of the equipment.   Pulmonary Rehab from 11/30/2017 in [facility] Cardiac and Pulmonary Rehab  Date  11/30/17  Educator  [facility]  Instruction Review Code  1- Verbalizes Understanding      Infection Prevention: - Provides verbal and written material to individual with discussion of infection control including proper hand washing and proper equipment cleaning during exercise session.   Pulmonary Rehab from 11/30/2017 in [facility] Cardiac and Pulmonary Rehab  Date  11/30/17  Educator  [facility]  Instruction Review Code  1- Verbalizes Understanding      Falls Prevention: - Provides verbal and written material to  individual with discussion of falls prevention and safety.   Pulmonary Rehab from 11/30/2017 in [facility] Cardiac and Pulmonary Rehab  Date  11/30/17  Educator  [facility]  Instruction Review Code  1- Verbalizes Understanding      Diabetes: - Individual verbal and written instruction to review signs/symptoms of diabetes, desired ranges of glucose level fasting, after meals and with exercise. Advice that pre and post exercise glucose checks will be done for 3 sessions at entry of program.   Chronic Lung Diseases: - Group verbal and written instruction to review updates, respiratory medications, advancements in procedures and treatments. Discuss use of supplemental oxygen including available portable oxygen systems, continuous and intermittent flow rates, concentrators, personal use and safety guidelines. Review proper use of inhaler and spacers. Provide informative websites for self-education.    Energy Conservation: - Provide group verbal and written instruction for methods to conserve energy, plan and organize  activities. Instruct on pacing techniques, use of adaptive equipment and posture/positioning to relieve shortness of breath.   Triggers and Exacerbations: - Group verbal and written instruction to review types of environmental triggers and ways to prevent exacerbations. Discuss weather changes, air quality and the benefits of nasal washing. Review warning signs and symptoms to help prevent infections. Discuss techniques for effective airway clearance, coughing, and vibrations.   AED/CPR: - Group verbal and written instruction with the use of models to demonstrate the basic use of the AED with the basic ABC's of resuscitation.   Anatomy and Physiology of the Lungs: - Group verbal and written instruction with the use of models to provide basic lung anatomy and physiology related to function, structure and complications of lung disease.   Anatomy & Physiology of the Heart: - Group verbal and written instruction and models provide basic cardiac anatomy and physiology, with the coronary electrical and arterial systems. Review of Valvular disease and Heart Failure   Cardiac Medications: - Group verbal and written instruction to review commonly prescribed medications for heart disease. Reviews the medication, class of the drug, and side effects.   Know Your Numbers and Risk Factors: -Group verbal and written instruction about important numbers in your health.  Discussion of what are risk factors and how they play a role in the disease process.  Review of Cholesterol, Blood Pressure, Diabetes, and BMI and the role they play in your overall health.   Sleep Hygiene: -Provides group verbal and written instruction about how sleep can affect your health.  Define sleep hygiene, discuss sleep cycles and impact of sleep habits. Review good sleep hygiene tips.    Other: -Provides group and verbal instruction on various topics (see comments)    Knowledge Questionnaire Score: Knowledge  Questionnaire Score - 11/30/17 1109      Knowledge Questionnaire Score   Pre Score  14/18 reviewed with patient        Core Components/Risk Factors/Patient Goals at Admission: Personal Goals and Risk Factors at Admission - 11/30/17 1122      Core Components/Risk Factors/Patient Goals on Admission    Weight Management  Yes;Weight Loss;Weight Maintenance    Intervention  Weight Management: Develop a combined nutrition and exercise program designed to reach desired caloric intake, while maintaining appropriate intake of nutrient and fiber, sodium and fats, and appropriate energy expenditure required for the weight goal.;Weight Management/Obesity: Establish reasonable short term and long term weight goals.;Weight Management: Provide education and appropriate resources to help participant work on and attain dietary goals.    Admit Weight  161 lb 11.2  oz (73.3 kg)    Goal Weight: Short Term  175 lb (79.4 kg)    Goal Weight: Long Term  170 lb (77.1 kg)    Expected Outcomes  Short Term: Continue to assess and modify interventions until short term weight is achieved;Long Term: Adherence to nutrition and physical activity/exercise program aimed toward attainment of established weight goal;Weight Maintenance: Understanding of the daily nutrition guidelines, which includes 25-35% calories from fat, 7% or less cal from saturated fats, less than '200mg'$  cholesterol, less than 1.5gm of sodium, & 5 or more servings of fruits and vegetables daily;Weight Loss: Understanding of general recommendations for a balanced deficit meal plan, which promotes 1-2 lb weight loss per week and includes a negative energy balance of [phone] kcal/d;Understanding recommendations for meals to include 15-35% energy as protein, 25-35% energy from fat, 35-60% energy from carbohydrates, less than '200mg'$  of dietary cholesterol, 20-35 gm of total fiber daily;Understanding of distribution of calorie intake throughout the day with the consumption  of 4-5 meals/snacks    Improve shortness of breath with ADL's  Yes    Intervention  Provide education, individualized exercise plan and daily activity instruction to help decrease symptoms of SOB with activities of daily living.    Expected Outcomes  Short Term: Improve cardiorespiratory fitness to achieve a reduction of symptoms when performing ADLs;Long Term: Be able to perform more ADLs without symptoms or delay the onset of symptoms    Heart Failure  Yes has angina    Intervention  Provide a combined exercise and nutrition program that is supplemented with education, support and counseling about heart failure. Directed toward relieving symptoms such as shortness of breath, decreased exercise tolerance, and extremity edema.    Expected Outcomes  Improve functional capacity of life;Short term: Attendance in program 2-3 days a week with increased exercise capacity. Reported lower sodium intake. Reported increased fruit and vegetable intake. Reports medication compliance.;Short term: Daily weights obtained and reported for increase. Utilizing diuretic protocols set by physician.;Long term: Adoption of self-care skills and reduction of barriers for early signs and symptoms recognition and intervention leading to self-care maintenance.    Hypertension  Yes    Intervention  Provide education on lifestyle modifcations including regular physical activity/exercise, weight management, moderate sodium restriction and increased consumption of fresh fruit, vegetables, and low fat dairy, alcohol moderation, and smoking cessation.;Monitor prescription use compliance.    Expected Outcomes  Short Term: Continued assessment and intervention until BP is < 140/27m HG in hypertensive participants. < 130/830mHG in hypertensive participants with diabetes, heart failure or chronic kidney disease.;Long Term: Maintenance of blood pressure at goal levels.    Lipids  Yes    Intervention  Provide education and support for  participant on nutrition & aerobic/resistive exercise along with prescribed medications to achieve LDL '70mg'$ , HDL >'40mg'$ .    Expected Outcomes  Short Term: Participant states understanding of desired cholesterol values and is compliant with medications prescribed. Participant is following exercise prescription and nutrition guidelines.;Long Term: Cholesterol controlled with medications as prescribed, with individualized exercise RX and with personalized nutrition plan. Value goals: LDL < '70mg'$ , HDL > 40 mg.       Core Components/Risk Factors/Patient Goals Review:    Core Components/Risk Factors/Patient Goals at Discharge (Final Review):    ITP Comments: ITP Comments    Row Name 11/30/17 1041           ITP Comments  Medical Evaluation completed. Chart sent for review and changes to Dr. MaEmily Filbertirector of  [name]. Diagnosis can be found in [facility] encounter 11/23/16          Comments: Initial ITP

## 2017-11-30 NOTE — Patient Instructions (Signed)
Patient Instructions  Patient Details  Name: Caleb Khan MRN: [id number] Date of Birth: 09-Dec-1927 Referring Provider:  Mertie Moores, MD  Below are your personal goals for exercise, nutrition, and risk factors. Our goal is to help you stay on track towards obtaining and maintaining these goals. We will be discussing your progress on these goals with you throughout the program.  Initial Exercise Prescription: Initial Exercise Prescription - 11/30/17 1200      Date of Initial Exercise RX and Referring Provider   Date  11/30/17    Referring Provider  Meredeth Ide      Treadmill   MPH  2    Grade  0.5    Minutes  15    METs  2.6      Recumbant Bike   Level  2    RPM  60    Watts  10    Minutes  15    METs  2.4      REL-XR   Level  2    Speed  50    Minutes  15    METs  2.4      T5 Nustep   Level  2    SPM  80    Minutes  15    METs  2.4      Prescription Details   Frequency (times per week)  3    Duration  Progress to 45 minutes of aerobic exercise without signs/symptoms of physical distress      Intensity   THRR 40-80% of Max Heartrate  92-119    Ratings of Perceived Exertion  11-15    Perceived Dyspnea  0-4      Resistance Training   Training Prescription  Yes    Weight  4 lb    Reps  10-15       Exercise Goals: Frequency: Be able to perform aerobic exercise two to three times per week in program working toward 2-5 days per week of home exercise.  Intensity: Work with a perceived exertion of 11 (fairly light) - 15 (hard) while following your exercise prescription.  We will make changes to your prescription with you as you progress through the program.   Duration: Be able to do 30 to 45 minutes of continuous aerobic exercise in addition to a 5 minute warm-up and a 5 minute cool-down routine.   Nutrition Goals: Your personal nutrition goals will be established when you do your nutrition analysis with the dietician.  The following are general nutrition  guidelines to follow: Cholesterol < /day Sodium < /day Fiber: Men over 50 yrs - 30 grams per day  Personal Goals: Personal Goals and Risk Factors at Admission - 11/30/17 1122      Core Components/Risk Factors/Patient Goals on Admission    Weight Management  Yes;Weight Loss;Weight Maintenance    Intervention  Weight Management: Develop a combined nutrition and exercise program designed to reach desired caloric intake, while maintaining appropriate intake of nutrient and fiber, sodium and fats, and appropriate energy expenditure required for the weight goal.;Weight Management/Obesity: Establish reasonable short term and long term weight goals.;Weight Management: Provide education and appropriate resources to help participant work on and attain dietary goals.    Admit Weight  161 lb 11.2 oz (73.3 kg)    Goal Weight: Short Term  175 lb (79.4 kg)    Goal Weight: Long Term  170 lb (77.1 kg)    Expected Outcomes  Short Term: Continue to assess and modify interventions  until short term weight is achieved;Long Term: Adherence to nutrition and physical activity/exercise program aimed toward attainment of established weight goal;Weight Maintenance: Understanding of the daily nutrition guidelines, which includes 25-35% calories from fat, 7% or less cal from saturated fats, less than  cholesterol, less than 1.5gm of sodium, & 5 or more servings of fruits and vegetables daily;Weight Loss: Understanding of general recommendations for a balanced deficit meal plan, which promotes 1-2 lb weight loss per week and includes a negative energy balance of [phone] kcal/d;Understanding recommendations for meals to include 15-35% energy as protein, 25-35% energy from fat, 35-60% energy from carbohydrates, less than  of dietary cholesterol, 20-35 gm of total fiber daily;Understanding of distribution of calorie intake throughout the day with the consumption of 4-5 meals/snacks    Improve shortness of breath  with ADL's  Yes    Intervention  Provide education, individualized exercise plan and daily activity instruction to help decrease symptoms of SOB with activities of daily living.    Expected Outcomes  Short Term: Improve cardiorespiratory fitness to achieve a reduction of symptoms when performing ADLs;Long Term: Be able to perform more ADLs without symptoms or delay the onset of symptoms    Heart Failure  Yes has angina    Intervention  Provide a combined exercise and nutrition program that is supplemented with education, support and counseling about heart failure. Directed toward relieving symptoms such as shortness of breath, decreased exercise tolerance, and extremity edema.    Expected Outcomes  Improve functional capacity of life;Short term: Attendance in program 2-3 days a week with increased exercise capacity. Reported lower sodium intake. Reported increased fruit and vegetable intake. Reports medication compliance.;Short term: Daily weights obtained and reported for increase. Utilizing diuretic protocols set by physician.;Long term: Adoption of self-care skills and reduction of barriers for early signs and symptoms recognition and intervention leading to self-care maintenance.    Hypertension  Yes    Intervention  Provide education on lifestyle modifcations including regular physical activity/exercise, weight management, moderate sodium restriction and increased consumption of fresh fruit, vegetables, and low fat dairy, alcohol moderation, and smoking cessation.;Monitor prescription use compliance.    Expected Outcomes  Short Term: Continued assessment and intervention until BP is < 140/45mm HG in hypertensive participants. < 130/37mm HG in hypertensive participants with diabetes, heart failure or chronic kidney disease.;Long Term: Maintenance of blood pressure at goal levels.    Lipids  Yes    Intervention  Provide education and support for participant on nutrition & aerobic/resistive exercise  along with prescribed medications to achieve LDL 70mg , HDL >40mg .    Expected Outcomes  Short Term: Participant states understanding of desired cholesterol values and is compliant with medications prescribed. Participant is following exercise prescription and nutrition guidelines.;Long Term: Cholesterol controlled with medications as prescribed, with individualized exercise RX and with personalized nutrition plan. Value goals: LDL < , HDL > 40 mg.       Tobacco Use Initial Evaluation: Social History   Tobacco Use  Smoking Status Former Smoker  . Packs/day: 2.00  . Years: 22.00  . Pack years: 44.00  . Types: Cigarettes  . Last attempt to quit: 05/22/1972  . Years since quitting: 45.5  Smokeless Tobacco Never Used    Exercise Goals and Review: Exercise Goals    Row Name 11/30/17 1228             Exercise Goals   Increase Physical Activity  Yes       Intervention  Provide advice, education, support  and counseling about physical activity/exercise needs.;Develop an individualized exercise prescription for aerobic and resistive training based on initial evaluation findings, risk stratification, comorbidities and participant's personal goals.       Expected Outcomes  Short Term: Attend rehab on a regular basis to increase amount of physical activity.;Long Term: Add in home exercise to make exercise part of routine and to increase amount of physical activity.;Long Term: Exercising regularly at least 3-5 days a week.       Increase Strength and Stamina  Yes       Intervention  Provide advice, education, support and counseling about physical activity/exercise needs.;Develop an individualized exercise prescription for aerobic and resistive training based on initial evaluation findings, risk stratification, comorbidities and participant's personal goals.       Expected Outcomes  Short Term: Increase workloads from initial exercise prescription for resistance, speed, and METs.;Short Term:  Perform resistance training exercises routinely during rehab and add in resistance training at home;Long Term: Improve cardiorespiratory fitness, muscular endurance and strength as measured by increased METs and functional capacity ( )       Able to understand and use rate of perceived exertion (RPE) scale  Yes       Intervention  Provide education and explanation on how to use RPE scale       Expected Outcomes  Short Term: Able to use RPE daily in rehab to express subjective intensity level;Long Term:  Able to use RPE to guide intensity level when exercising independently       Able to understand and use Dyspnea scale  Yes       Intervention  Provide education and explanation on how to use Dyspnea scale       Expected Outcomes  Short Term: Able to use Dyspnea scale daily in rehab to express subjective sense of shortness of breath during exertion;Long Term: Able to use Dyspnea scale to guide intensity level when exercising independently       Knowledge and understanding of Target Heart Rate Range (THRR)  Yes       Intervention  Provide education and explanation of THRR including how the numbers were predicted and where they are located for reference       Expected Outcomes  Short Term: Able to state/look up THRR;Short Term: Able to use daily as guideline for intensity in rehab;Long Term: Able to use THRR to govern intensity when exercising independently       Able to check pulse independently  Yes       Intervention  Provide education and demonstration on how to check pulse in carotid and radial arteries.;Review the importance of being able to check your own pulse for safety during independent exercise       Expected Outcomes  Short Term: Able to explain why pulse checking is important during independent exercise;Long Term: Able to check pulse independently and accurately       Understanding of Exercise Prescription  Yes       Intervention  Provide education, explanation, and written materials on  patient's individual exercise prescription       Expected Outcomes  Short Term: Able to explain program exercise prescription;Long Term: Able to explain home exercise prescription to exercise independently          Copy of goals given to participant.

## 2017-12-06 ENCOUNTER — Encounter: Payer: Medicare PPO | Admitting: *Deleted

## 2017-12-06 DIAGNOSIS — J449 Chronic obstructive pulmonary disease, unspecified: Secondary | ICD-10-CM

## 2017-12-06 NOTE — Progress Notes (Signed)
Daily Session Note  Patient Details  Name: Caleb Khan MRN: [id number] Date of Birth: 1928/04/29 Referring Provider:     Pulmonary Rehab from 11/30/2017 in [facility] and Pulmonary Rehab  Referring Provider  [name]      Encounter Date: 12/06/2017  Check In: Session Check In - 12/06/17 1039      Check-In   Location  [facility]-Cardiac & Pulmonary Rehab    Staff Present  Earlean Shawl, BS, ACSM CEP, Exercise Physiologist;Amanda Oletta Darter, BA, ACSM CEP, Exercise Physiologist;Mary Kellie Shropshire, RN, BSN, MA    Supervising physician immediately available to respond to emergencies  [name] immediately available ER MD    Physician(s)  Dr. Corky Downs and Dr. Alfred Levins    Medication changes reported      No    Fall or balance concerns reported     No    Tobacco Cessation  No Change    Warm-up and Cool-down  Performed as group-led instruction    Resistance Training Performed  No    VAD Patient?  No      Pain Assessment   Currently in Pain?  No/denies    Multiple Pain Sites  No          Social History   Tobacco Use  Smoking Status Former Smoker  . Packs/day: 2.00  . Years: 22.00  . Pack years: 44.00  . Types: Cigarettes  . Last attempt to quit: 05/22/1972  . Years since quitting: 45.5  Smokeless Tobacco Never Used    Goals Met:  Proper associated with RPD/PD & O2 Sat Independence with exercise equipment Exercise tolerated well No report of cardiac concerns or symptoms Strength training completed today  Personal goals reveiwed Reviewed PLB  Goals Unmet:  Not Applicable  Comments: First full day of exercise!  Patient was oriented to gym and equipment including functions, settings, policies, and procedures.  Patient's individual exercise prescription and treatment plan were reviewed.  All starting workloads were established based on the results of the 6 minute walk test done at initial orientation visit.  The plan for exercise progression was also introduced and progression will be  customized based on patient's performance and goals. .    Dr. Emily Filbert is Medical Director for [facility] and [name].

## 2017-12-10 ENCOUNTER — Encounter: Payer: Medicare PPO | Admitting: *Deleted

## 2017-12-10 DIAGNOSIS — J449 Chronic obstructive pulmonary disease, unspecified: Secondary | ICD-10-CM

## 2017-12-10 NOTE — Progress Notes (Signed)
Daily Session Note  Patient Details  Name: Caleb Khan MRN: [id number] Date of Birth: 1928/03/01 Referring Provider:     Pulmonary Rehab from 11/30/2017 in [facility] Re[facility]  Referring Provider  Caleb Khan      Encounter Date: 12/10/2017  Check In: Session Check In - 12/10/17 1022      Check-In   Location  [facility]Cardiac & Pulmonary Re[facility]    Staff Present  Caleb Khan, BS, RRT, Respiratory Therapist;Caleb Sherryll Burger, RN Caleb Khan, BA, ACSM CEP, Exercise Physiologist    Supervising physician immediately available to respond to emergencies  [name] immediately available ER MD    Physician(s)  Dr. Joni Khan and Caleb Khan    Medication changes reported      No    Fall or balance concerns reported     No    Tobacco Cessation  No Change    Warm-up and Cool-down  Performed as group-led instruction    Resistance Training Performed  Yes    VAD Patient?  No      Pain Assessment   Currently in Pain?  No/denies          Social History   Tobacco Use  Smoking Status Former Smoker  . Packs/day: 2.00  . Years: 22.00  . Pack years: 44.00  . Types: Cigarettes  . Last attempt to quit: 05/22/1972  . Years since quitting: 45.5  Smokeless Tobacco Never Used    Goals Met:  Proper associated with RPD/PD & O2 Sat Independence with exercise equipment Using PLB without cueing & demonstrates good technique Exercise tolerated well No report of cardiac concerns or symptoms Strength training completed today  Goals Unmet:  Not Applicable  Comments: Pt able to follow exercise prescription today without complaint.  Will continue to monitor for progression.    Dr. Emily Khan is Medical Director for [name] and [name].

## 2017-12-13 ENCOUNTER — Encounter: Payer: Medicare PPO | Admitting: *Deleted

## 2017-12-13 DIAGNOSIS — J449 Chronic obstructive pulmonary disease, unspecified: Secondary | ICD-10-CM | POA: Diagnosis not present

## 2017-12-13 NOTE — Progress Notes (Signed)
Daily Session Note  Patient Details  Name: Caleb Khan MRN: [id number] Date of Birth: [birth year] Referring Provider:     Pulmonary Rehab from 11/30/2017 in [facility] Card[facility] and Pulmonary Rehab  Referring Provider  [name]      Encounter Date: 12/13/2017  Check In: Session Check In - 12/13/17 1028      Check-In   Location  [facility] Pulmonary Re[facility]    Staff Present  Earlean Shawl, BS, ACSM CEP, Exercise Physiologist;Susanne Bice, RN, BSN, Lance Sell, BA, ACSM CEP, Exercise Physiologist    Supervising physician immediately available to respond to emergencies  See telemetry face sheet for immediately available ER MD    Physician(s)  Dr. Clearnce Hasten and Dr. Jimmye Norman     Medication changes reported      No    Fall or balance concerns reported     No    Tobacco Cessation  No Change    Warm-up and Cool-down  Performed on first and last piece of equipment    Resistance Training Performed  Yes    VAD Patient?  No      Pain Assessment   Currently in Pain?  No/denies    Multiple Pain Sites  No          Social History   Tobacco Use  Smoking Status Former Smoker  . Packs/day: 2.00  . Years: 22.00  . Pack years: 44.00  . Types: Cigarettes  . Last attempt to quit: 05/22/1972  . Years since quitting: 45.5  Smokeless Tobacco Never Used    Goals Met:  Proper associated with RPD/PD & O2 Sat Independence with exercise equipment Exercise tolerated well Personal goals reviewed No report of cardiac concerns or symptoms Strength training completed today  Goals Unmet:  Not Applicable  Comments: Pt able to follow exercise prescription today without complaint.  Will continue to monitor for progression.    Dr. Emily Filbert is Medical Director for [place] and [name].

## 2017-12-16 ENCOUNTER — Encounter: Payer: Self-pay | Admitting: Emergency Medicine

## 2017-12-16 ENCOUNTER — Emergency Department: Payer: Medicare PPO

## 2017-12-16 ENCOUNTER — Other Ambulatory Visit: Payer: Self-pay

## 2017-12-16 ENCOUNTER — Emergency Department
Admission: EM | Admit: 2017-12-16 | Discharge: 2017-12-17 | Disposition: A | Discharge status: Home / Self Care | Payer: Medicare PPO | Attending: Emergency Medicine | Admitting: Emergency Medicine

## 2017-12-16 DIAGNOSIS — Z79899 Other long term (current) drug therapy: Secondary | ICD-10-CM | POA: Insufficient documentation

## 2017-12-16 DIAGNOSIS — S61411A Laceration without foreign body of right hand, initial encounter: Secondary | ICD-10-CM | POA: Insufficient documentation

## 2017-12-16 DIAGNOSIS — Y93K1 Activity, walking an animal: Secondary | ICD-10-CM | POA: Insufficient documentation

## 2017-12-16 DIAGNOSIS — Y999 Unspecified external cause status: Secondary | ICD-10-CM | POA: Diagnosis not present

## 2017-12-16 DIAGNOSIS — I129 Hypertensive chronic kidney disease with stage 1 through stage 4 chronic kidney disease, or unspecified chronic kidney disease: Secondary | ICD-10-CM | POA: Insufficient documentation

## 2017-12-16 DIAGNOSIS — S6991XA Unspecified injury of right wrist, hand and finger(s), initial encounter: Secondary | ICD-10-CM | POA: Diagnosis present

## 2017-12-16 DIAGNOSIS — Z87891 Personal history of nicotine dependence: Secondary | ICD-10-CM | POA: Diagnosis not present

## 2017-12-16 DIAGNOSIS — W010XXA Fall on same level from slipping, tripping and stumbling without subsequent striking against object, initial encounter: Secondary | ICD-10-CM | POA: Insufficient documentation

## 2017-12-16 DIAGNOSIS — S0240EA Zygomatic fracture, right side, initial encounter for closed fracture: Secondary | ICD-10-CM | POA: Diagnosis not present

## 2017-12-16 DIAGNOSIS — Z7982 Long term (current) use of aspirin: Secondary | ICD-10-CM | POA: Diagnosis not present

## 2017-12-16 DIAGNOSIS — Y929 Unspecified place or not applicable: Secondary | ICD-10-CM | POA: Diagnosis not present

## 2017-12-16 DIAGNOSIS — I509 Heart failure, unspecified: Secondary | ICD-10-CM | POA: Diagnosis not present

## 2017-12-16 DIAGNOSIS — R51 Headache: Secondary | ICD-10-CM | POA: Insufficient documentation

## 2017-12-16 DIAGNOSIS — S61412A Laceration without foreign body of left hand, initial encounter: Secondary | ICD-10-CM | POA: Diagnosis not present

## 2017-12-16 DIAGNOSIS — N189 Chronic kidney disease, unspecified: Secondary | ICD-10-CM | POA: Insufficient documentation

## 2017-12-16 NOTE — ED Triage Notes (Addendum)
Patient ambulatory to triage with steady gait, without difficulty or distress noted; pt reports PTA had dog on a leash and tripped falling on concrete; pt c/o pain to hands (lac noted to pinkie side of both hands and right thumb) and right side of head; abrasion to right cheek; denies LOC

## 2017-12-16 NOTE — ED Provider Notes (Signed)
[facility] Emergency Department Provider Note  ____________________________________________   First MD Initiated Contact with Patient 12/16/17 2353     (approximate)  I have reviewed the triage vital signs and the nursing notes.   HISTORY  Chief Complaint Fall    HPI Caleb Khan is a 84 y.o. male who self presents to the emergency department with right facial pain and bilateral hand pain after tripping over his dog's leash this morning while walking.  He tripped fell on bilateral outstretched hands and onto his right face.  He had sudden onset severe pain in his right face.  He denies loss of consciousness.  He did "see stars".  No neck pain.  No numbness or weakness.  His tetanus is up-to-date.  His pain in his hands is now stinging sharp mild to moderate worse with movement improved with rest.  No chest pain shortness of breath abdominal pain nausea or vomiting.  Past Medical History:  Diagnosis Date  . Anemia   . Anginal pain (HCC)   . Arthritis    osteoarthritis  . CHF (congestive heart failure) (HCC)   . Chronic kidney disease   . Coronary artery disease   . Dyspnea    with exertion  . Hypertension     There are no active problems to display for this patient.   Past Surgical History:  Procedure Laterality Date  . CARDIAC CATHETERIZATION Left 05/22/2016   Procedure: Left Heart Cath and Coronary Angiography;  Surgeon: Alwyn Pea, MD;  Location: [facility] INVASIVE CV LAB;  Service: Cardiovascular;  Laterality: Left;  . EYE SURGERY Bilateral    Cataract Extraction with IOL  . JOINT REPLACEMENT Right 2004   Shoulder Replacement  . JOINT REPLACEMENT Left 2007   Ankle Replacement  . PATELLAR TENDON REPAIR Left 07/15/2017   Procedure: PATELLA TENDON REPAIR;  Surgeon: Kennedy Bucker, MD;  Location: [facility] ORS;  Service: Orthopedics;  Laterality: Left;  Marland Kitchen QUADRICEPS TENDON REPAIR Left 07/15/2017   Procedure: REPAIR QUADRICEP TENDON;  Surgeon:  Kennedy Bucker, MD;  Location: [facility] ORS;  Service: Orthopedics;  Laterality: Left;  . TONSILLECTOMY      Prior to Admission medications   Medication Sig Start Date End Date Taking? Authorizing Provider  amLODipine (NORVASC) 5 MG tablet Take 5 mg by mouth 2 (two) times daily.     [provider]  aspirin EC 81 MG tablet Take 81 mg by mouth daily.    [provider]  CALCIUM-VITAMIN D PO Take 1 tablet by mouth daily.    [provider]  docusate sodium (COLACE) 100 MG capsule Take 200 mg by mouth daily as needed for mild constipation.    [provider]  HYDROcodone-acetaminophen (NORCO) 5-325 MG tablet Take 1-2 tablets by mouth every 6 (six) hours as needed for moderate pain. 07/15/17   Kennedy Bucker, MD  losartan (COZAAR) 25 MG tablet Take 25 mg by mouth daily.    [provider]  Multiple Vitamins-Minerals (MULTIVITAMIN PO) Take 1 tablet by mouth daily.    [provider]  nitroGLYCERIN (NITROSTAT) 0.4 MG SL tablet Place 0.4 mg under the tongue every 5 (five) minutes as needed for chest pain.    [provider]  predniSONE (DELTASONE) 10 MG tablet Take 10 mg by mouth daily with breakfast.    [provider]  Red Yeast Rice Extract (RED YEAST RICE PO) Take 1 capsule by mouth 2 (two) times daily.     [provider]  terazosin (  HYTRIN) 10 MG capsule Take 10 mg by mouth at bedtime.    [provider]    Allergies Benazepril  Family History  Problem Relation Age of Onset  . Hypertension Mother     Social History Social History   Tobacco Use  . Smoking status: Former Smoker    Packs/day: 2.00    Years: 22.00    Pack years: 44.00    Types: Cigarettes    Last attempt to quit: 05/22/1972    Years since quitting: 45.6  . Smokeless tobacco: Never Used  Substance Use Topics  . Alcohol use: Yes    Alcohol/week: 0.6 - 1.2 oz    Types: 1 - 2 Glasses of wine per week    Comment: daily  . Drug use:  No    Review of Systems Constitutional: No fever/chills Eyes: No visual changes. ENT: No sore throat. Cardiovascular: Denies chest pain. Respiratory: Denies shortness of breath. Gastrointestinal: No abdominal pain.  No nausea, no vomiting.  No diarrhea.  No constipation. Genitourinary: Negative for dysuria. Musculoskeletal: Positive for hand pain Skin: Positive for wound Neurological: Positive for headache   ____________________________________________   PHYSICAL EXAM:  VITAL SIGNS: ED Triage Vitals [12/16/17 2307]  Enc Vitals Group     BP (!) 183/72     Pulse Rate 69     Resp 18     Temp 97.6 F (36.4 C)     Temp Source Oral     SpO2 98 %     Weight 155 lb (70.3 kg)     Height  (1.626 m)     Head Circumference      Peak Flow      Pain Score 5     Pain Loc      Pain Edu?      Excl. in GC?     Constitutional: Alert and oriented x4 joking laughing pleasant cooperative speaks full clear sentences no diaphoresis Eyes: PERRL EOMI. midrange and brisk no injection Head: Ecchymosis and swelling along with tenderness to zygoma on the right none on the left. Nose: No congestion/rhinnorhea. Mouth/Throat: No trismus Neck: No stridor.  No midline tenderness or step-offs Cardiovascular: Normal rate, regular rhythm. Grossly normal heart sounds.  Good peripheral circulation. Respiratory: Normal respiratory effort.  No retractions. Lungs CTAB and moving good air Gastrointestinal: Soft nontender Musculoskeletal: No snuffbox tenderness on either side no distal radius or ulna tenderness neurovascularly intact can flex pinky fingers at MCP PIP and DIP Neurologic:  Normal speech and language. No gross focal neurologic deficits are appreciated. Skin: Bilateral hands with lacerations just at the base of the pinky finger.  On the right it is 4 cm on the left is 4 cm Psychiatric: Mood and affect are normal. Speech and behavior are  normal.    ____________________________________________   DIFFERENTIAL includes but not limited to  Intracerebral hemorrhage, facial fracture, cervical spine fracture, wrist fracture, laceration ____________________________________________   LABS (all labs ordered are listed, but only abnormal results are displayed)  Labs Reviewed - No data to display   __________________________________________  EKG   ____________________________________________  RADIOLOGY  CT scan of the head and neck reviewed by me shows [facility] fracture on the right X-ray bilateral wrist reviewed by me with no fracture ____________________________________________   PROCEDURES  Procedure(s) performed: Yes  .Nerve Block Date/Time: 12/17/2017 6:53 AM Performed by: Merrily Brittle, MD Authorized by: Merrily Brittle, MD   Consent:    Consent obtained:  Verbal   Consent given by:  Patient   Risks discussed:  Nerve damage, swelling, unsuccessful block and pain   Alternatives discussed:  Alternative treatment Indications:    Indications:  Procedural anesthesia Location:    Body area:  Upper extremity   Upper extremity nerve blocked: Ulnar.   Laterality:  Right Pre-procedure details:    Skin preparation:  Alcohol Skin anesthesia (see MAR for exact dosages):    Skin anesthesia method:  None Procedure details (see MAR for exact dosages):    Block needle gauge:  25 G   Guidance: ultrasound     Anesthetic injected:  Bupivacaine 0.5% w/o epi   Steroid injected:  None   Additive injected:  None   Injection procedure:  Anatomic landmarks identified and negative aspiration for blood   Paresthesia:  Immediately resolved Post-procedure details:    Dressing:  None   Outcome:  Anesthesia achieved   Patient tolerance of procedure:  Tolerated well, no immediate complications .Nerve Block Date/Time: 12/17/2017 6:54 AM Performed by: Merrily Brittle, MD Authorized by: Merrily Brittle, MD   Consent:     Consent obtained:  Verbal   Consent given by:  Patient   Risks discussed:  Infection, nerve damage, swelling, unsuccessful block and pain   Alternatives discussed:  Alternative treatment Indications:    Indications:  Procedural anesthesia Location:    Body area:  Upper extremity   Upper extremity nerve blocked: Ulnar.   Laterality:  Left Pre-procedure details:    Skin preparation:  Alcohol Skin anesthesia (see MAR for exact dosages):    Skin anesthesia method:  None Procedure details (see MAR for exact dosages):    Block needle gauge:  25 G   Guidance: ultrasound     Anesthetic injected:  Bupivacaine 0.5% w/o epi   Steroid injected:  None   Additive injected:  None   Injection procedure:  Anatomic landmarks identified   Paresthesia:  Immediately resolved Post-procedure details:    Dressing:  None   Outcome:  Anesthesia achieved   Patient tolerance of procedure:  Tolerated well, no immediate complications .Marland KitchenLaceration Repair Date/Time: 12/17/2017 6:55 AM Performed by: Merrily Brittle, MD Authorized by: Merrily Brittle, MD   Consent:    Consent obtained:  Verbal   Consent given by:  Patient   Risks discussed:  Infection, pain, retained foreign body, poor cosmetic result and poor wound healing Anesthesia (see MAR for exact dosages):    Anesthesia method:  Nerve block Laceration details:    Location:  Hand   Hand location:  R palm   Length (cm):  4 Repair type:    Repair type:  Intermediate Pre-procedure details:    Preparation:  Patient was prepped and draped in usual sterile fashion Exploration:    Hemostasis achieved with:  Direct pressure   Wound exploration: wound explored through full range of motion and entire depth of wound probed and visualized     Wound extent: areolar tissue violated and fascia violated     Wound extent: no foreign bodies/material noted, no muscle damage noted, no nerve damage noted, no tendon damage noted, no underlying fracture noted and no  vascular damage noted     Contaminated: no   Treatment:    Area cleansed with:  Saline and soap and water   Amount of cleaning:  Extensive   Irrigation solution:  Sterile saline and tap water   Irrigation method:  Tap   Visualized foreign bodies/material removed: no   Subcutaneous repair:    Suture size:  5-0   Suture material:  Vicryl   Number of sutures:  1 Skin repair:    Repair method:  Sutures   Suture size:  5-0   Suture material:  Nylon   Number of sutures:  3 Approximation:    Approximation:  Close Post-procedure details:    Dressing:  Sterile dressing   Patient tolerance of procedure:  Tolerated well, no immediate complications .Marland KitchenLaceration Repair Date/Time: 12/17/2017 6:56 AM Performed by: Merrily Brittle, MD Authorized by: Merrily Brittle, MD   Consent:    Consent obtained:  Verbal   Consent given by:  Patient   Risks discussed:  Infection, pain, retained foreign body, poor cosmetic result and poor wound healing Anesthesia (see MAR for exact dosages):    Anesthesia method:  Local infiltration and nerve block Laceration details:    Location:  Hand   Hand location:  L palm   Length (cm):  4 Repair type:    Repair type:  Intermediate Pre-procedure details:    Preparation:  Patient was prepped and draped in usual sterile fashion Exploration:    Hemostasis achieved with:  Direct pressure   Wound exploration: entire depth of wound probed and visualized     Wound extent: areolar tissue violated and fascia violated     Wound extent: no foreign bodies/material noted, no muscle damage noted, no nerve damage noted, no tendon damage noted, no underlying fracture noted and no vascular damage noted     Contaminated: no   Treatment:    Area cleansed with:  Saline and soap and water   Amount of cleaning:  Extensive   Irrigation solution:  Sterile saline and tap water   Irrigation method:  Tap   Visualized foreign bodies/material removed: no   Subcutaneous repair:     Suture size:  5-0   Suture material:  Vicryl   Number of sutures:  1 Skin repair:    Repair method:  Sutures   Suture size:  5-0   Suture material:  Nylon   Number of sutures:  4 Approximation:    Approximation:  Close Post-procedure details:    Dressing:  Sterile dressing   Patient tolerance of procedure:  Tolerated well, no immediate complications    Critical Care performed: no  Observation: no ____________________________________________   INITIAL IMPRESSION / ASSESSMENT AND PLAN / ED COURSE  Pertinent labs & imaging results that were available during my care of the patient were reviewed by me and considered in my medical decision making (see chart for details).  The patient arrives with right facial pain and bilateral hand and wrist pain following a mechanical fall while tripping over his dog's leash.  Regarding his facial injury he takes aspirin every day but no blood thinning medication and not Plavix.  CT scan of his head and neck shows [facility] fracture on the right.  His extraocular motions are intact he has no signs of traumatic iritis.  His pain is adequately controlled with over-the-counter medications.  He will be referred to [facility] to consider outpatient surgery.  Regarding lacerations on his hands his tetanus is up-to-date.  I performed bilateral ulnar nerve blocks with 0.5% bupivacaine without epinephrine using ultrasound and direct guidance.  I achieved complete anesthesia.  I then washed out both wounds with soap and water copiously.  I then explored both wounds in a bloodless field with good overhead lighting through full range of motion and noted no bony or tendinous injury.  No foreign bodies identified.  He has full range of motion and is neurovascularly  intact.  Each wound was then closed 2 layers with good cosmesis.  No other traumatic injuries noted.  He is discharged home in improved condition verbalizes understanding and agreement the plan.       FINAL  CLINICAL IMPRESSION(S) / ED DIAGNOSES  Final diagnoses:  Closed fracture of right zygomatic arch, initial encounter (HCC)  Laceration of left hand without foreign body, initial encounter  Laceration of right hand without foreign body, initial encounter      NEW MEDICATIONS STARTED DURING THIS VISIT:  Discharge Medication List as of 12/17/2017  2:43 AM       Note:  This document was prepared using Dragon voice recognition software and may include unintentional dictation errors.     Merrily Brittle, MD 12/17/17 [phone]

## 2017-12-17 ENCOUNTER — Encounter: Payer: Self-pay | Admitting: *Deleted

## 2017-12-17 ENCOUNTER — Telehealth: Payer: Self-pay | Admitting: *Deleted

## 2017-12-17 ENCOUNTER — Emergency Department: Payer: Medicare PPO

## 2017-12-17 DIAGNOSIS — J449 Chronic obstructive pulmonary disease, unspecified: Secondary | ICD-10-CM

## 2017-12-17 MED ORDER — BUPIVACAINE HCL (PF) 0.5 % IJ SOLN
10.0000 mL | Freq: Once | INTRAMUSCULAR | Status: AC
  Administered 2017-12-17: 10 mL
  Filled 2017-12-17: qty 30

## 2017-12-17 NOTE — Discharge Instructions (Signed)
Today I placed three 5-0 nylon sutures in your right hand and four 5-0 nylon sutures on the left hand.  Please keep your wound clean and dry and have them removed in 10 days.  Follow-up with the oral maxillofacial surgeon this coming week for recheck.  Return to the emergency department sooner for any concerns whatsoever.  It was a pleasure to take care of you today, and thank you for coming to our emergency department.  If you have any questions or concerns before leaving please ask the nurse to grab me and I'm more than happy to go through your aftercare instructions again.  If you were prescribed any opioid pain medication today such as Norco, Vicodin, Percocet, morphine, hydrocodone, or oxycodone please make sure you do not drive when you are taking this medication as it can alter your ability to drive safely.  If you have any concerns once you are home that you are not improving or are in fact getting worse before you can make it to your follow-up appointment, please do not hesitate to call 911 and come back for further evaluation.  Merrily Brittle, MD  Results for orders placed or performed during the hospital encounter of 07/14/17  CBC  Result Value Ref Range   WBC 9.9 3.8 - 10.6 K/uL   RBC 4.14 (L) 4.40 - 5.90 MIL/uL   Hemoglobin 13.2 13.0 - 18.0 g/dL   HCT 16.1 (L) 09.6 - 04.5 %   MCV 94.9 80.0 - 100.0 fL   MCH 32.0 26.0 - 34.0 pg   MCHC 33.7 32.0 - 36.0 g/dL   RDW 40.9 81.1 - 91.4 %   Platelets 241 150 - 440 K/uL  Differential  Result Value Ref Range   Neutrophils Relative % 88 %   Neutro Abs 8.7 (H) 1.4 - 6.5 K/uL   Lymphocytes Relative 7 %   Lymphs Abs 0.7 (L) 1.0 - 3.6 K/uL   Monocytes Relative 5 %   Monocytes Absolute 0.5 0.2 - 1.0 K/uL   Eosinophils Relative 0 %   Eosinophils Absolute 0.0 0 - 0.7 K/uL   Basophils Relative 0 %   Basophils Absolute 0.0 0 - 0.1 K/uL  Basic metabolic panel  Result Value Ref Range   Sodium 138 135 - 145 mmol/L   Potassium 4.8 3.5 - 5.1  mmol/L   Chloride 105 101 - 111 mmol/L   CO2 25 22 - 32 mmol/L   Glucose, Bld 113 (H) 65 - 99 mg/dL   BUN 27 (H) 6 - 20 mg/dL   Creatinine, Ser 7.82 (H) 0.61 - 1.24 mg/dL   Calcium 9.3 8.9 - 95.6 mg/dL   GFR calc non Af Amer 45 (L) >60 mL/min   GFR calc Af Amer 52 (L) >60 mL/min   Anion gap 8 5 - 15   Ct Head Wo Contrast  Result Date: 12/16/2017 CLINICAL DATA:  Patient tripped and fell on concrete prior to arrival. Headache. EXAM: CT HEAD WITHOUT CONTRAST CT CERVICAL SPINE WITHOUT CONTRAST TECHNIQUE: Multidetector CT imaging of the head and cervical spine was performed following the standard protocol without intravenous contrast. Multiplanar CT image reconstructions of the cervical spine were also generated. COMPARISON:  None. FINDINGS: CT HEAD FINDINGS Brain: Superficial atrophy is noted with minimal small vessel ischemic disease. No large vascular territory infarct, hemorrhage or midline shift. Vascular: No hyperdense vessel sign.  No unexpected calcifications. Skull: No acute calvarial fracture or significant scalp swelling. Sinuses/Orbits: Acute right zygomaticomaxillary complex fracture involving the anterior and  lateral right maxillary sinus wall and right zygomatic arch. Two separate fractures are noted with slight medial depression and angulation of the right zygomatic arch. Additional minimally displaced right lateral orbital wall fracture is identified. Associated soft tissue swelling is seen about the right orbit and cheek. Small air-fluid level in the right maxillary sinus. Mild circumferential mucosal thickening of the included left maxillary sinus. The ethmoid and sphenoid sinuses are nonacute as is the frontal sinus. Intact orbits and globes with bilateral lens replacements. Other: None CT CERVICAL SPINE FINDINGS Alignment: Slight anterolisthesis grade 1 of C4 on C5 and retrolisthesis of C5 on C6 attributable to degenerative disc and facet arthropathy. Intact atlantodental interval and  craniocervical relationship. Skull base and vertebrae: No acute fracture. No primary bone lesion or focal pathologic process. Soft tissues and spinal canal: No prevertebral fluid or swelling. No visible canal hematoma. Disc levels: Marked disc flattening C5-6, C6-7 and C7-T1 with mild disc flattening C2 through C5. No jumped or perched facets. Mild canal stenosis at C5-6 secondary to slight retrolisthesis of C5. Upper chest: Pleural-parenchymal scarring at the apices with calcified pleural plaque. Other: Dense atherosclerotic calcifications of the left extracranial carotid artery with moderate calcification of the right. IMPRESSION: 1. Acute zygomaticomaxillary complex fracture involving the right anterior and lateral walls of the maxillary sinus and right zygomatic arch with associated soft tissue swelling and trace fluid level likely representing a hematocrit level in the right maxillary sinus. Additional minimally displaced fracture involving the right lateral orbital wall is noted. 2. No acute intracranial abnormality. 3. Cervical spondylosis without acute cervical spine fracture. Electronically Signed   By: Tollie Eth M.D.   On: 12/16/2017 23:41   Ct Cervical Spine Wo Contrast  Result Date: 12/16/2017 CLINICAL DATA:  Patient tripped and fell on concrete prior to arrival. Headache. EXAM: CT HEAD WITHOUT CONTRAST CT CERVICAL SPINE WITHOUT CONTRAST TECHNIQUE: Multidetector CT imaging of the head and cervical spine was performed following the standard protocol without intravenous contrast. Multiplanar CT image reconstructions of the cervical spine were also generated. COMPARISON:  None. FINDINGS: CT HEAD FINDINGS Brain: Superficial atrophy is noted with minimal small vessel ischemic disease. No large vascular territory infarct, hemorrhage or midline shift. Vascular: No hyperdense vessel sign.  No unexpected calcifications. Skull: No acute calvarial fracture or significant scalp swelling. Sinuses/Orbits: Acute  right zygomaticomaxillary complex fracture involving the anterior and lateral right maxillary sinus wall and right zygomatic arch. Two separate fractures are noted with slight medial depression and angulation of the right zygomatic arch. Additional minimally displaced right lateral orbital wall fracture is identified. Associated soft tissue swelling is seen about the right orbit and cheek. Small air-fluid level in the right maxillary sinus. Mild circumferential mucosal thickening of the included left maxillary sinus. The ethmoid and sphenoid sinuses are nonacute as is the frontal sinus. Intact orbits and globes with bilateral lens replacements. Other: None CT CERVICAL SPINE FINDINGS Alignment: Slight anterolisthesis grade 1 of C4 on C5 and retrolisthesis of C5 on C6 attributable to degenerative disc and facet arthropathy. Intact atlantodental interval and craniocervical relationship. Skull base and vertebrae: No acute fracture. No primary bone lesion or focal pathologic process. Soft tissues and spinal canal: No prevertebral fluid or swelling. No visible canal hematoma. Disc levels: Marked disc flattening C5-6, C6-7 and C7-T1 with mild disc flattening C2 through C5. No jumped or perched facets. Mild canal stenosis at C5-6 secondary to slight retrolisthesis of C5. Upper chest: Pleural-parenchymal scarring at the apices with calcified pleural plaque. Other: Dense  atherosclerotic calcifications of the left extracranial carotid artery with moderate calcification of the right. IMPRESSION: 1. Acute zygomaticomaxillary complex fracture involving the right anterior and lateral walls of the maxillary sinus and right zygomatic arch with associated soft tissue swelling and trace fluid level likely representing a hematocrit level in the right maxillary sinus. Additional minimally displaced fracture involving the right lateral orbital wall is noted. 2. No acute intracranial abnormality. 3. Cervical spondylosis without acute  cervical spine fracture. Electronically Signed   By: Tollie Eth M.D.   On: 12/16/2017 23:41   Dg Hand Complete Left  Result Date: 12/17/2017 CLINICAL DATA:  Fall on outstretched hand EXAM: LEFT HAND - COMPLETE 3+ VIEW COMPARISON:  None. FINDINGS: No acute displaced fracture or malalignment. Arthritis at the DIP and PIP joints with probable small erosion at the head of the third middle phalanx. Arthritis at the first [facility] joint with radial subluxation of the base of the first metacarpal. Flexion deformity at the first MCP joint. IMPRESSION: 1. No definite acute osseous abnormality 2. Arthritis of the digits with mild subluxation of the base of the first metacarpal. Electronically Signed   By: Jasmine Pang M.D.   On: 12/17/2017 00:49   Dg Hand Complete Right  Result Date: 12/17/2017 CLINICAL DATA:  Fall on outstretched hand with pain EXAM: RIGHT HAND - COMPLETE 3+ VIEW COMPARISON:  None. FINDINGS: No acute displaced fracture is seen. Arthritis at the first [facility] joint with mild radial subluxation of the base of the first metacarpal. Mild flexion deformity at the first MCP joint. Arthritis at the DIP and PIP joints. IMPRESSION: 1. No fracture seen 2. Arthritis at the DIP and PIP joints. Arthritis at the first [facility] joint with mild radial subluxation of the base of the first metacarpal Electronically Signed   By: Jasmine Pang M.D.   On: 12/17/2017 00:48

## 2017-12-17 NOTE — Telephone Encounter (Signed)
Caleb Khan called to let us know he had an accident.  Upon returning his call and reviewing his chart, he fell waking the dog at home and cut both hands and broke a bone in his face.  He is awaiting outpt f/u with maxiofacial specialist for possible surgery.  He is going to be out for at least two weeks and will let us know what the surgeon decides.

## 2017-12-17 NOTE — ED Notes (Addendum)
Walking dog and tripped resulting lacerations of both hands and injury to facial bones

## 2017-12-27 ENCOUNTER — Encounter: Payer: Medicare PPO | Attending: Specialist

## 2017-12-27 DIAGNOSIS — J449 Chronic obstructive pulmonary disease, unspecified: Secondary | ICD-10-CM

## 2017-12-27 NOTE — Progress Notes (Signed)
Pulmonary Individual Treatment Plan  Patient Details  Name: Jenner Rosier MRN: [id number] Date of Birth: [birth year] Referring Provider:     Pulmonary Rehab from 11/30/2017 in [facility] Cardiac and Pulmonary Rehab  Referring Provider  [name]      Initial Encounter Date:    Pulmonary Rehab from 11/30/2017 in [facility] Cardiac and Pulmonary Rehab  Date  11/30/17  Referring Provider  [name]      Visit Diagnosis: Chronic obstructive pulmonary disease, unspecified COPD type ([place])  Patient's Home Medications on Admission:  Current Outpatient Medications:  .  amLODipine (NORVASC) 5 MG tablet, Take 5 mg by mouth 2 (two) times daily. , Disp: , Rfl:  .  aspirin EC 81 MG tablet, Take 81 mg by mouth daily., Disp: , Rfl:  .  CALCIUM-VITAMIN D PO, Take 1 tablet by mouth daily., Disp: , Rfl:  .  docusate sodium (COLACE) 100 MG capsule, Take 200 mg by mouth daily as needed for mild constipation., Disp: , Rfl:  .  HYDROcodone-acetaminophen (NORCO) 5-325 MG tablet, Take 1-2 tablets by mouth every 6 (six) hours as needed for moderate pain., Disp: 30 tablet, Rfl: 0 .  losartan (COZAAR) 25 MG tablet, Take 25 mg by mouth daily., Disp: , Rfl:  .  Multiple Vitamins-Minerals (MULTIVITAMIN PO), Take 1 tablet by mouth daily., Disp: , Rfl:  .  nitroGLYCERIN (NITROSTAT) 0.4 MG SL tablet, Place 0.4 mg under the tongue every 5 (five) minutes as needed for chest pain., Disp: , Rfl:  .  predniSONE (DELTASONE) 10 MG tablet, Take 10 mg by mouth daily with breakfast., Disp: , Rfl:  .  Red Yeast Rice Extract (RED YEAST RICE PO), Take 1 capsule by mouth 2 (two) times daily. , Disp: , Rfl:  .  terazosin (HYTRIN) 10 MG capsule, Take 10 mg by mouth at bedtime., Disp: , Rfl:   Past Medical History: Past Medical History:  Diagnosis Date  . Anemia   . Anginal pain ([place])   . Arthritis    osteoarthritis  . CHF (congestive heart failure) ([place])   . Chronic kidney disease   . Coronary artery disease   . Dyspnea    with exertion  .  Hypertension     Tobacco Use: Social History   Tobacco Use  Smoking Status Former Smoker  . Packs/day: 2.00  . Years: 22.00  . Pack years: 44.00  . Types: Cigarettes  . Last attempt to quit: 05/22/1972  . Years since quitting: 45.6  Smokeless Tobacco Never Used    Labs: Recent Review Flowsheet Data    There is no flowsheet data to display.       Pulmonary Assessment Scores: Pulmonary Assessment Scores    Row Name 11/30/17 1106         ADL UCSD   ADL Phase  Entry     SOB Score total  33     Rest  0     Walk  2     Stairs  4     Bath  0     Dress  1     Shop  2       CAT Score   CAT Score  17       mMRC Score   mMRC Score  1        Pulmonary Function Assessment: Pulmonary Function Assessment - 11/30/17 1124      Initial Spirometry Results   FVC%  134 %    FEV1%  152 %  FEV1/FVC Ratio  76.13    Comments  best of 2 good patient effort      Post Bronchodilator Spirometry Results   FVC%  129.98 %    FEV1%  155.04 %    FEV1/FVC Ratio  80.41    Comments  best of 2 good patient effort      Breath   Bilateral Breath Sounds  Clear    Shortness of Breath  Yes;Limiting activity       Exercise Target Goals:    Exercise Program Goal: Individual exercise prescription set using results from initial 6 min walk test and THRR while considering  patient's activity barriers and safety.    Exercise Prescription Goal: Initial exercise prescription builds to 30-45 minutes a day of aerobic activity, 2-3 days per week.  Home exercise guidelines will be given to patient during program as part of exercise prescription that the participant will acknowledge.  Activity Barriers & Risk Stratification:   6 Minute Walk: 6 Minute Walk    Row Name 11/30/17 1229         6 Minute Walk   Distance  1360 feet     Walk Time  6 minutes     # of Rest Breaks  0     MPH  2.58     METS  2.19     RPE  13     Perceived Dyspnea   2     VO2 Peak  7.66     Symptoms  No      Resting HR  64 bpm     Resting BP  120/54     Resting Oxygen Saturation   97 %     Exercise Oxygen Saturation  during 6 min walk  94 %     Max Ex. HR  108 bpm     Max Ex. BP  132/58     2 Minute Post BP  126/58       Interval HR   1 Minute HR  70     2 Minute HR  78     3 Minute HR  77     4 Minute HR  92     5 Minute HR  108     6 Minute HR  103     2 Minute Post HR  80     Interval Heart Rate?  Yes       Interval Oxygen   Interval Oxygen?  Yes     Baseline Oxygen Saturation %  97 %     1 Minute Oxygen Saturation %  95 %     1 Minute Liters of Oxygen  0 L     2 Minute Oxygen Saturation %  94 %     2 Minute Liters of Oxygen  0 L     3 Minute Oxygen Saturation %  94 %     3 Minute Liters of Oxygen  0 L     4 Minute Oxygen Saturation %  94 %     4 Minute Liters of Oxygen  0 L     5 Minute Oxygen Saturation %  94 %     5 Minute Liters of Oxygen  0 L     6 Minute Oxygen Saturation %  95 %     6 Minute Liters of Oxygen  0 L     2 Minute Post Oxygen Saturation %  97 %     2 Minute Post  Liters of Oxygen  0 L       Oxygen Initial Assessment: Oxygen Initial Assessment - 11/30/17 1121      Home Oxygen   Home Oxygen Device  Home Concentrator    Sleep Oxygen Prescription  Continuous    Liters per minute  2    Home Exercise Oxygen Prescription  None    Home at Rest Exercise Oxygen Prescription  None    Compliance with Home Oxygen Use  Yes      Initial 6 min Walk   Oxygen Used  None      Program Oxygen Prescription   Program Oxygen Prescription  None      Intervention   Short Term Goals  To learn and understand importance of monitoring SPO2 with pulse oximeter and demonstrate accurate use of the pulse oximeter.;To learn and understand importance of maintaining oxygen saturations>88%;To learn and demonstrate proper pursed lip breathing techniques or other breathing techniques.;To learn and exhibit compliance with exercise, home and travel O2 prescription    Long  Term  Goals  Exhibits compliance with exercise, home and travel O2 prescription;Verbalizes importance of monitoring SPO2 with pulse oximeter and return demonstration;Maintenance of O2 saturations>88%;Exhibits proper breathing techniques, such as pursed lip breathing or other method taught during program session       Oxygen Re-Evaluation: Oxygen Re-Evaluation    Row Name 12/06/17 1043 12/13/17 1102           Program Oxygen Prescription   Program Oxygen Prescription  None  None        Home Oxygen   Home Oxygen Device  Home Concentrator  Home Concentrator      Sleep Oxygen Prescription  Continuous  Continuous      Liters per minute  2  2      Home Exercise Oxygen Prescription  None  None      Home at Rest Exercise Oxygen Prescription  None  None      Compliance with Home Oxygen Use  Yes  Yes        Goals/Expected Outcomes   Short Term Goals  To learn and demonstrate proper pursed lip breathing techniques or other breathing techniques.;To learn and understand importance of monitoring SPO2 with pulse oximeter and demonstrate accurate use of the pulse oximeter.;To learn and understand importance of maintaining oxygen saturations>88%;To learn and exhibit compliance with exercise, home and travel O2 prescription  To learn and exhibit compliance with exercise, home and travel O2 prescription;To learn and understand importance of monitoring SPO2 with pulse oximeter and demonstrate accurate use of the pulse oximeter.;To learn and understand importance of maintaining oxygen saturations>88%;To learn and demonstrate proper pursed lip breathing techniques or other breathing techniques.;To learn and demonstrate proper use of respiratory medications      Long  Term Goals  -  Exhibits compliance with exercise, home and travel O2 prescription;Verbalizes importance of monitoring SPO2 with pulse oximeter and return demonstration;Maintenance of O2 saturations>88%;Exhibits proper breathing techniques, such as pursed  lip breathing or other method taught during program session;Compliance with respiratory medication;Demonstrates proper use of MDI's      Comments  Reviewed PLB with patient. He demonstrated understanding of these concepts.   Reviewed PLB with patient. He demonstrated understanding of these concepts and stated he does [place] PLB at home when he gets SOB.       Goals/Expected Outcomes  Short: Patient will use PLB during exercise to control SOB Long: Patient will become independent with PLB to help control SOB with  ADL's.   Short: Patient will use PLB during exercise to control SOB Long: Patient will become independent with PLB to help control SOB with ADL's.          Oxygen Discharge (Final Oxygen Re-Evaluation): Oxygen Re-Evaluation - 12/13/17 1102      Program Oxygen Prescription   Program Oxygen Prescription  None      Home Oxygen   Home Oxygen Device  Home Concentrator    Sleep Oxygen Prescription  Continuous    Liters per minute  2    Home Exercise Oxygen Prescription  None    Home at Rest Exercise Oxygen Prescription  None    Compliance with Home Oxygen Use  Yes      Goals/Expected Outcomes   Short Term Goals  To learn and exhibit compliance with exercise, home and travel O2 prescription;To learn and understand importance of monitoring SPO2 with pulse oximeter and demonstrate accurate use of the pulse oximeter.;To learn and understand importance of maintaining oxygen saturations>88%;To learn and demonstrate proper pursed lip breathing techniques or other breathing techniques.;To learn and demonstrate proper use of respiratory medications    Long  Term Goals  Exhibits compliance with exercise, home and travel O2 prescription;Verbalizes importance of monitoring SPO2 with pulse oximeter and return demonstration;Maintenance of O2 saturations>88%;Exhibits proper breathing techniques, such as pursed lip breathing or other method taught during program session;Compliance with respiratory  medication;Demonstrates proper use of MDI's    Comments  Reviewed PLB with patient. He demonstrated understanding of these concepts and stated he does [place] PLB at home when he gets SOB.     Goals/Expected Outcomes  Short: Patient will use PLB during exercise to control SOB Long: Patient will become independent with PLB to help control SOB with ADL's.        Initial Exercise Prescription: Initial Exercise Prescription - 11/30/17 1200      Date of Initial Exercise RX and Referring Provider   Date  11/30/17    Referring Provider  [name]      Treadmill   MPH  2    Grade  0.5    Minutes  15    METs  2.6      Recumbant Bike   Level  2    RPM  60    Watts  10    Minutes  15    METs  2.4      REL-XR   Level  2    Speed  50    Minutes  15    METs  2.4      T5 Nustep   Level  2    SPM  80    Minutes  15    METs  2.4      Prescription Details   Frequency (times per week)  3    Duration  Progress to 45 minutes of aerobic exercise without signs/symptoms of physical distress      Intensity   THRR 40-80% of Max Heartrate  92-119    Ratings of Perceived Exertion  11-15    Perceived Dyspnea  0-4      Resistance Training   Training Prescription  Yes    Weight  4 lb    Reps  10-15       Perform Capillary Blood Glucose checks as needed.  Exercise Prescription Changes: Exercise Prescription Changes    Row Name 12/14/17 1300             Response to Exercise  Blood Pressure (Admit)  112/60       Blood Pressure (Exercise)  130/70       Blood Pressure (Exit)  130/80       Heart Rate (Admit)  64 bpm       Heart Rate (Exercise)  90 bpm       Heart Rate (Exit)  62 bpm       Oxygen Saturation (Admit)  98 %       Oxygen Saturation (Exercise)  97 %       Oxygen Saturation (Exit)  93 %       Rating of Perceived Exertion (Exercise)  14       Perceived Dyspnea (Exercise)  2       Symptoms  none       Duration  Continue with 45 min of aerobic exercise without signs/symptoms  of physical distress.       Intensity  THRR New         Progression   Progression  Continue to progress workloads to maintain intensity without signs/symptoms of physical distress.       Average METs  2.8         Resistance Training   Training Prescription  Yes       Weight  4 lb       Reps  10-15         Treadmill   MPH  2       Grade  3       Minutes  15       METs  3.3         T5 Nustep   Level  5       SPM  80       Minutes  15       METs  2.3          Exercise Comments: Exercise Comments    Row Name 12/06/17 1041           Exercise Comments   First full day of exercise!  Patient was oriented to gym and equipment including functions, settings, policies, and procedures.  Patient's individual exercise prescription and treatment plan were reviewed.  All starting workloads were established based on the results of the 6 minute walk test done at initial orientation visit.  The plan for exercise progression was also introduced and progression will be customized based on patient's performance and goals.          Exercise Goals and Review: Exercise Goals    Row Name 11/30/17 1228             Exercise Goals   Increase Physical Activity  Yes       Intervention  Provide advice, education, support and counseling about physical activity/exercise needs.;Develop an individualized exercise prescription for aerobic and resistive training based on initial evaluation findings, risk stratification, comorbidities and participant's personal goals.       Expected Outcomes  Short Term: Attend rehab on a regular basis to increase amount of physical activity.;Long Term: Add in home exercise to make exercise part of routine and to increase amount of physical activity.;Long Term: Exercising regularly at least 3-5 days a week.       Increase Strength and Stamina  Yes       Intervention  Provide advice, education, support and counseling about physical activity/exercise needs.;Develop an  individualized exercise prescription for aerobic and resistive training based on initial evaluation findings, risk stratification, comorbidities  and participant's personal goals.       Expected Outcomes  Short Term: Increase workloads from initial exercise prescription for resistance, speed, and METs.;Short Term: Perform resistance training exercises routinely during rehab and add in resistance training at home;Long Term: Improve cardiorespiratory fitness, muscular endurance and strength as measured by increased METs and functional capacity (6MWT)       Able to understand and use rate of perceived exertion (RPE) scale  Yes       Intervention  Provide education and explanation on how to use RPE scale       Expected Outcomes  Short Term: Able to use RPE daily in rehab to express subjective intensity level;Long Term:  Able to use RPE to guide intensity level when exercising independently       Able to understand and use Dyspnea scale  Yes       Intervention  Provide education and explanation on how to use Dyspnea scale       Expected Outcomes  Short Term: Able to use Dyspnea scale daily in rehab to express subjective sense of shortness of breath during exertion;Long Term: Able to use Dyspnea scale to guide intensity level when exercising independently       Knowledge and understanding of Target Heart Rate Range (THRR)  Yes       Intervention  Provide education and explanation of THRR including how the numbers were predicted and where they are located for reference       Expected Outcomes  Short Term: Able to state/look up THRR;Short Term: Able to use daily as guideline for intensity in rehab;Long Term: Able to use THRR to govern intensity when exercising independently       Able to check pulse independently  Yes       Intervention  Provide education and demonstration on how to check pulse in carotid and radial arteries.;Review the importance of being able to check your own pulse for safety during  independent exercise       Expected Outcomes  Short Term: Able to explain why pulse checking is important during independent exercise;Long Term: Able to check pulse independently and accurately       Understanding of Exercise Prescription  Yes       Intervention  Provide education, explanation, and written materials on patient's individual exercise prescription       Expected Outcomes  Short Term: Able to explain program exercise prescription;Long Term: Able to explain home exercise prescription to exercise independently          Exercise Goals Re-Evaluation : Exercise Goals Re-Evaluation    Row Name 12/06/17 1041 12/13/17 1050 12/14/17 1323         Exercise Goal Re-Evaluation   Exercise Goals Review  Increase Physical Activity;Increase Strength and Stamina;Able to understand and use rate of perceived exertion (RPE) scale;Knowledge and understanding of Target Heart Rate Range (THRR);Able to understand and use Dyspnea scale;Understanding of Exercise Prescription  Increase Physical Activity;Increase Strength and Stamina;Able to understand and use rate of perceived exertion (RPE) scale;Able to understand and use Dyspnea scale;Knowledge and understanding of Target Heart Rate Range (THRR);Understanding of Exercise Prescription  Increase Physical Activity;Increase Strength and Stamina;Able to understand and use Dyspnea scale;Able to understand and use rate of perceived exertion (RPE) scale     Comments  Reviewed RPE scale, THR and program prescription with pt today.  Pt voiced understanding and was given a copy of goals to take home.   Ramonte feels like he is getting a  good start to the program and has had no complications with his exercise so far. He also walks at home.  Benito has tolerated exercise well in first sessions.  He has increased incline on TM.  Staff will monitor progress.     Expected Outcomes  Short: Use RPE daily to regulate intensity.  Long: Follow program prescription in THR.  Short: attend  Pulmonary rehab on a regular basis. Long: Increase met level and improve SOB.   Short - Pt will attend regularly Long - Pt will improve MET level        Discharge Exercise Prescription (Final Exercise Prescription Changes): Exercise Prescription Changes - 12/14/17 1300      Response to Exercise   Blood Pressure (Admit)  112/60    Blood Pressure (Exercise)  130/70    Blood Pressure (Exit)  130/80    Heart Rate (Admit)  64 bpm    Heart Rate (Exercise)  90 bpm    Heart Rate (Exit)  62 bpm    Oxygen Saturation (Admit)  98 %    Oxygen Saturation (Exercise)  97 %    Oxygen Saturation (Exit)  93 %    Rating of Perceived Exertion (Exercise)  14    Perceived Dyspnea (Exercise)  2    Symptoms  none    Duration  Continue with 45 min of aerobic exercise without signs/symptoms of physical distress.    Intensity  THRR New      Progression   Progression  Continue to progress workloads to maintain intensity without signs/symptoms of physical distress.    Average METs  2.8      Resistance Training   Training Prescription  Yes    Weight  4 lb    Reps  10-15      Treadmill   MPH  2    Grade  3    Minutes  15    METs  3.3      T5 Nustep   Level  5    SPM  80    Minutes  15    METs  2.3       Nutrition:  Target Goals: Understanding of nutrition guidelines, daily intake of sodium <158m, cholesterol <2083m calories 30% from fat and 7% or less from saturated fats, daily to have 5 or more servings of fruits and vegetables.  Biometrics: Pre Biometrics - 11/30/17 1228      Pre Biometrics   Height  5' 5.5" (1.664 m)    Weight  161 lb 11.2 oz (73.3 kg)    Waist Circumference  36.5 inches    Hip Circumference  38.5 inches    Waist to Hip Ratio  0.95 %    BMI (Calculated)  26.49        Nutrition Therapy Plan and Nutrition Goals: Nutrition Therapy & Goals - 11/30/17 1119      Personal Nutrition Goals   Comments  Lose a little weight, Learn how to eat healthier       Intervention Plan   Intervention  Prescribe, educate and counsel regarding individualized specific dietary modifications aiming towards targeted core components such as weight, hypertension, lipid management, diabetes, heart failure and other comorbidities.;Nutrition handout(s) given to patient.    Expected Outcomes  Short Term Goal: Understand basic principles of dietary content, such as calories, fat, sodium, cholesterol and nutrients.;Long Term Goal: Adherence to prescribed nutrition plan.       Nutrition Assessments: Nutrition Assessments - 11/30/17 1106  MEDFICTS Scores   Pre Score  49       Nutrition Goals Re-Evaluation: Nutrition Goals Re-Evaluation    Row Name 12/13/17 1100             Goals   Comment  Set Milbert Coulter up for a dietician appointment on 12/22/17       Expected Outcome  Short: Meet with dietician Long: follow nutician goals set during dietician meeting.           Nutrition Goals Discharge (Final Nutrition Goals Re-Evaluation): Nutrition Goals Re-Evaluation - 12/13/17 1100      Goals   Comment  Set Milbert Coulter up for a dietician appointment on 12/22/17    Expected Outcome  Short: Meet with dietician Long: follow nutician goals set during dietician meeting.        Psychosocial: Target Goals: Acknowledge presence or absence of significant depression and/or stress, maximize coping skills, provide positive support system. Participant is able to verbalize types and ability to use techniques and skills needed for reducing stress and depression.   Initial Review & Psychosocial Screening: Initial Psych Review & Screening - 11/30/17 1117      Initial Review   Current issues with  Current Stress Concerns    Source of Stress Concerns  Chronic Illness    Comments  He states his COPD is is the main thing that stresses him out.      Family Dynamics   Good Support System?  Yes    Comments  He can look to his wife and daughter for support      Barriers   Psychosocial  barriers to participate in program  There are no identifiable barriers or psychosocial needs.;The patient should benefit from training in stress management and relaxation.      Screening Interventions   Interventions  Provide feedback about the scores to participant;Encouraged to exercise;Program counselor consult;To provide support and resources with identified psychosocial needs    Expected Outcomes  Short Term goal: Utilizing psychosocial counselor, staff and physician to assist with identification of specific Stressors or current issues interfering with healing process. Setting desired goal for each stressor or current issue identified.;Long Term Goal: Stressors or current issues are controlled or eliminated.;Short Term goal: Identification and review with participant of any Quality of Life or Depression concerns found by scoring the questionnaire.;Long Term goal: The participant improves quality of Life and PHQ9 Scores as seen by post scores and/or verbalization of changes       Quality of Life Scores:  Scores of 19 and below usually indicate a poorer quality of life in these areas.  A difference of  2-3 points is a clinically meaningful difference.  A difference of 2-3 points in the total score of the Quality of Life Index has been associated with significant improvement in overall quality of life, self-image, physical symptoms, and general health in studies assessing change in quality of life.  PHQ-9: Recent Review Flowsheet Data    Depression screen [facility] 2/9 11/30/2017   Decreased Interest 0   Down, Depressed, Hopeless 0   PHQ - 2 Score 0   Altered sleeping 0   Tired, decreased energy 1   Change in appetite 0   Feeling bad or failure about yourself  1   Trouble concentrating 0   Moving slowly or fidgety/restless 0   Suicidal thoughts 0   PHQ-9 Score 2   Difficult doing work/chores Somewhat difficult     Interpretation of Total Score  Total Score Depression Severity:  1-4 = Minimal  depression, 5-9 = Mild depression, 10-14 = Moderate depression, 15-19 = Moderately severe depression, 20-27 = Severe depression   Psychosocial Evaluation and Intervention: Psychosocial Evaluation - 12/13/17 1108      Psychosocial Evaluation & Interventions   Interventions  Encouraged to exercise with the program and follow exercise prescription;Relaxation education    Comments  Counselor met with Mr. Haddix Stettler) today for initial psychosocial evaluation.  He is an 84 year old who has COPD.  Nashon has a strong support system with a spouse of 42 years; a daughter in [place]; 6 Grandchildren and (3) Designer, industrial/product.  Zaydenn also lives in [place] [facility] community and has lots of supports there as well.  He reports sleeping well with Oxygen at night only and he has a good appetite.  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  His mood is generally positive and he has minimal stress in his life other than his health.  [employer] has goals to lose weight and increase his endurance while in this program.  He will continue working out at [employer] after completion of this program.      Expected Outcomes  Short:  Bryley will meet with the dietician to address his weight loss goals.   Long:  Chisom will exercise consistently to increase his endurance.    Continue Psychosocial Services   Follow up required by staff       Psychosocial Re-Evaluation:   Psychosocial Discharge (Final Psychosocial Re-Evaluation):   Education: Education Goals: Education classes will be provided on a weekly basis, covering required topics. Participant will state understanding/return demonstration of topics presented.  Learning Barriers/Preferences: Learning Barriers/Preferences - 11/30/17 1120      Learning Barriers/Preferences   Learning Barriers  Sight wears glasses    Learning Preferences  None       Education Topics:  Initial Evaluation Education: - Verbal,  written and demonstration of respiratory meds, oximetry and breathing techniques. Instruction on use of nebulizers and MDIs and importance of monitoring MDI activations.   Pulmonary Rehab from 12/13/2017 in [facility] Cardiac and Pulmonary Rehab  Date  11/30/17  Educator  [facility]  Instruction Review Code  1- Verbalizes Understanding      General Nutrition Guidelines/Fats and Fiber: -Group instruction provided by verbal, written material, models and posters to present the general guidelines for heart healthy nutrition. Gives an explanation and review of dietary fats and fiber.   Pulmonary Rehab from 12/13/2017 in [facility] Cardiac and Pulmonary Rehab  Date  12/13/17  Educator  [facility]  Instruction Review Code  1- Verbalizes Understanding      Controlling Sodium/Reading Food Labels: -Group verbal and written material supporting the discussion of sodium use in heart healthy nutrition. Review and explanation with models, verbal and written materials for utilization of the food label.   Exercise Physiology & General Exercise Guidelines: - Group verbal and written instruction with models to review the exercise physiology of the cardiovascular system and associated critical values. Provides general exercise guidelines with specific guidelines to those with heart or lung disease.    Aerobic Exercise & Resistance Training: - Gives group verbal and written instruction on the various components of exercise. Focuses on aerobic and resistive training programs and the benefits of this training and how to safely progress through these programs.   Flexibility, Balance, Mind/Body Relaxation: Provides group verbal/written instruction on the benefits of flexibility and balance training, including mind/body exercise modes  such as yoga, pilates and tai chi.  Demonstration and skill practice provided.   Stress and Anxiety: - Provides group verbal and written instruction about the health risks of elevated stress and causes of  high stress.  Discuss the correlation between heart/lung disease and anxiety and treatment options. Review healthy ways to manage with stress and anxiety.   Depression: - Provides group verbal and written instruction on the correlation between heart/lung disease and depressed mood, treatment options, and the stigmas associated with seeking treatment.   Exercise & Equipment Safety: - Individual verbal instruction and demonstration of equipment use and safety with use of the equipment.   Pulmonary Rehab from 12/13/2017 in [facility] Cardiac and Pulmonary Rehab  Date  11/30/17  Educator  [facility]  Instruction Review Code  1- Verbalizes Understanding      Infection Prevention: - Provides verbal and written material to individual with discussion of infection control including proper hand washing and proper equipment cleaning during exercise session.   Pulmonary Rehab from 12/13/2017 in [facility] Cardiac and Pulmonary Rehab  Date  11/30/17  Educator  [facility]  Instruction Review Code  1- Verbalizes Understanding      Falls Prevention: - Provides verbal and written material to individual with discussion of falls prevention and safety.   Pulmonary Rehab from 12/13/2017 in [facility] Cardiac and Pulmonary Rehab  Date  11/30/17  Educator  [facility]  Instruction Review Code  1- Verbalizes Understanding      Diabetes: - Individual verbal and written instruction to review signs/symptoms of diabetes, desired ranges of glucose level fasting, after meals and with exercise. Advice that pre and post exercise glucose checks will be done for 3 sessions at entry of program.   Chronic Lung Diseases: - Group verbal and written instruction to review updates, respiratory medications, advancements in procedures and treatments. Discuss use of supplemental oxygen including available portable oxygen systems, continuous and intermittent flow rates, concentrators, personal use and safety guidelines. Review proper use of inhaler and spacers.  Provide informative websites for self-education.    Energy Conservation: - Provide group verbal and written instruction for methods to conserve energy, plan and organize activities. Instruct on pacing techniques, use of adaptive equipment and posture/positioning to relieve shortness of breath.   Triggers and Exacerbations: - Group verbal and written instruction to review types of environmental triggers and ways to prevent exacerbations. Discuss weather changes, air quality and the benefits of nasal washing. Review warning signs and symptoms to help prevent infections. Discuss techniques for effective airway clearance, coughing, and vibrations.   AED/CPR: - Group verbal and written instruction with the use of models to demonstrate the basic use of the AED with the basic ABC's of resuscitation.   Anatomy and Physiology of the Lungs: - Group verbal and written instruction with the use of models to provide basic lung anatomy and physiology related to function, structure and complications of lung disease.   Anatomy & Physiology of the Heart: - Group verbal and written instruction and models provide basic cardiac anatomy and physiology, with the coronary electrical and arterial systems. Review of Valvular disease and Heart Failure   Cardiac Medications: - Group verbal and written instruction to review commonly prescribed medications for heart disease. Reviews the medication, class of the drug, and side effects.   Know Your Numbers and Risk Factors: -Group verbal and written instruction about important numbers in your health.  Discussion of what are risk factors and how they play a role in the disease process.  Review of  Cholesterol, Blood Pressure, Diabetes, and BMI and the role they play in your overall health.   Sleep Hygiene: -Provides group verbal and written instruction about how sleep can affect your health.  Define sleep hygiene, discuss sleep cycles and impact of sleep habits.  Review good sleep hygiene tips.    Other: -Provides group and verbal instruction on various topics (see comments)    Knowledge Questionnaire Score: Knowledge Questionnaire Score - 11/30/17 1109      Knowledge Questionnaire Score   Pre Score  14/18 reviewed with patient        Core Components/Risk Factors/Patient Goals at Admission: Personal Goals and Risk Factors at Admission - 11/30/17 1122      Core Components/Risk Factors/Patient Goals on Admission    Weight Management  Yes;Weight Loss;Weight Maintenance    Intervention  Weight Management: Develop a combined nutrition and exercise program designed to reach desired caloric intake, while maintaining appropriate intake of nutrient and fiber, sodium and fats, and appropriate energy expenditure required for the weight goal.;Weight Management/Obesity: Establish reasonable short term and long term weight goals.;Weight Management: Provide education and appropriate resources to help participant work on and attain dietary goals.    Admit Weight  161 lb 11.2 oz (73.3 kg)    Goal Weight: Short Term  175 lb (79.4 kg)    Goal Weight: Long Term  170 lb (77.1 kg)    Expected Outcomes  Short Term: Continue to assess and modify interventions until short term weight is achieved;Long Term: Adherence to nutrition and physical activity/exercise program aimed toward attainment of established weight goal;Weight Maintenance: Understanding of the daily nutrition guidelines, which includes 25-35% calories from fat, 7% or less cal from saturated fats, less than 274m cholesterol, less than 1.5gm of sodium, & 5 or more servings of fruits and vegetables daily;Weight Loss: Understanding of general recommendations for a balanced deficit meal plan, which promotes 1-2 lb weight loss per week and includes a negative energy balance of [phone] kcal/d;Understanding recommendations for meals to include 15-35% energy as protein, 25-35% energy from fat, 35-60% energy from  carbohydrates, less than 203mof dietary cholesterol, 20-35 gm of total fiber daily;Understanding of distribution of calorie intake throughout the day with the consumption of 4-5 meals/snacks    Improve shortness of breath with ADL's  Yes    Intervention  Provide education, individualized exercise plan and daily activity instruction to help decrease symptoms of SOB with activities of daily living.    Expected Outcomes  Short Term: Improve cardiorespiratory fitness to achieve a reduction of symptoms when performing ADLs;Long Term: Be able to perform more ADLs without symptoms or delay the onset of symptoms    Heart Failure  Yes has angina    Intervention  Provide a combined exercise and nutrition program that is supplemented with education, support and counseling about heart failure. Directed toward relieving symptoms such as shortness of breath, decreased exercise tolerance, and extremity edema.    Expected Outcomes  Improve functional capacity of life;Short term: Attendance in program 2-3 days a week with increased exercise capacity. Reported lower sodium intake. Reported increased fruit and vegetable intake. Reports medication compliance.;Short term: Daily weights obtained and reported for increase. Utilizing diuretic protocols set by physician.;Long term: Adoption of self-care skills and reduction of barriers for early signs and symptoms recognition and intervention leading to self-care maintenance.    Hypertension  Yes    Intervention  Provide education on lifestyle modifcations including regular physical activity/exercise, weight management, moderate sodium restriction and increased consumption  of fresh fruit, vegetables, and low fat dairy, alcohol moderation, and smoking cessation.;Monitor prescription use compliance.    Expected Outcomes  Short Term: Continued assessment and intervention until BP is < 140/86m HG in hypertensive participants. < 130/860mHG in hypertensive participants with diabetes,  heart failure or chronic kidney disease.;Long Term: Maintenance of blood pressure at goal levels.    Lipids  Yes    Intervention  Provide education and support for participant on nutrition & aerobic/resistive exercise along with prescribed medications to achieve LDL <7069mHDL >33m82m  Expected Outcomes  Short Term: Participant states understanding of desired cholesterol values and is compliant with medications prescribed. Participant is following exercise prescription and nutrition guidelines.;Long Term: Cholesterol controlled with medications as prescribed, with individualized exercise RX and with personalized nutrition plan. Value goals: LDL < 70mg60mL > 40 mg.       Core Components/Risk Factors/Patient Goals Review:  Goals and Risk Factor Review    Row Name 12/13/17 1031             Core Components/Risk Factors/Patient Goals Review   Personal Goals Review  Weight Management/Obesity;Improve shortness of breath with ADL's;Heart Failure;Lipids;Develop more efficient breathing techniques such as purse lipped breathing and diaphragmatic breathing and practicing self-pacing with activity.;Hypertension       Review  Pulminologist recommended Gussie Gram 10lbs to help his breathing and he has already lost 4. He weighs himself almost every day and is conisitand with taking all meds. His BP and lipids are repored to be under control. We uses PBL and hopes this program will help improve his SOB.        Expected Outcomes  Short: goal weight of 150 lbs. Long: Impove MET level and SOB.           Core Components/Risk Factors/Patient Goals at Discharge (Final Review):  Goals and Risk Factor Review - 12/13/17 1031      Core Components/Risk Factors/Patient Goals Review   Personal Goals Review  Weight Management/Obesity;Improve shortness of breath with ADL's;Heart Failure;Lipids;Develop more efficient breathing techniques such as purse lipped breathing and diaphragmatic breathing and practicing  self-pacing with activity.;Hypertension    Review  Pulminologist recommended Reace Ruble 10lbs to help his breathing and he has already lost 4. He weighs himself almost every day and is conisitand with taking all meds. His BP and lipids are repored to be under control. We uses [facility] and hopes this program will help improve his SOB.     Expected Outcomes  Short: goal weight of 150 lbs. Long: Impove MET level and SOB.        ITP Comments: ITP Comments    Row Name 11/30/17 1041 12/17/17 1035 12/27/17 0823       ITP Comments  Medical Evaluation completed. Chart sent for review and changes to Dr. Mark Emily Filbertctor of [facility]  Vrishank Stevoned to let us [place] he had an accident.  Upon returning his call and reviewing his chart, he fell waking the dog at home and cut both hands and broke a bone in his face.  He is awaiting outpt f/u with maxiofacial specialist for possible surgery.  He is going to be out for at least two weeks and will let us [place] what the surgeon decides.    30 day review completed. ITP sent to Dr. Mark Emily Filbertctor of [facility] with ITP unless changes are made by physician        Comments:  30 day review  

## 2017-12-27 NOTE — Progress Notes (Signed)
Daily Session Note  Patient Details  Name: Caleb Khan MRN: [id number] Date of Birth: 1928/05/03 Referring Provider:     Pulmonary Rehab from 11/30/2017 in [facility] Cardiac and Pulmonary Rehab  Referring Provider  [name]      Encounter Date: 12/27/2017  Check In: Session Check In - 12/27/17 1127      Check-In   Location  [facility]-Cardiac & Pulmonary Rehab    Staff Present  Earlean Shawl, BS, ACSM CEP, Exercise Physiologist;Mandi Eden, BS, PEC;Shane Badeaux [employer] physician immediately available to respond to emergencies  [name] immediately available ER MD    Physician(s)  Dr. Corky Downs and Cinda Quest    Medication changes reported      No    Fall or balance concerns reported     No    Tobacco Cessation  No Change    Warm-up and Cool-down  Performed as group-led instruction    Resistance Training Performed  Yes    VAD Patient?  No      Pain Assessment   Currently in Pain?  No/denies          Social History   Tobacco Use  Smoking Status Former Smoker  . Packs/day: 2.00  . Years: 22.00  . Pack years: 44.00  . Types: Cigarettes  . Last attempt to quit: 05/22/1972  . Years since quitting: 45.6  Smokeless Tobacco Never Used    Goals Met:  Independence with exercise equipment Exercise tolerated well No report of cardiac concerns or symptoms Strength training completed today  Goals Unmet:  Not Applicable  Comments: Pt able to follow exercise prescription today without complaint.  Will continue to monitor for progression.   Dr. Emily Filbert is Medical Director for [place] and [name].

## 2017-12-29 DIAGNOSIS — J449 Chronic obstructive pulmonary disease, unspecified: Secondary | ICD-10-CM | POA: Diagnosis not present

## 2017-12-29 NOTE — Progress Notes (Signed)
Daily Session Note  Patient Details  Name: Jobany Montellano MRN: [id number] Date of Birth: 09/26/1927 Referring Provider:     Pulmonary Rehab from 11/30/2017 in [facility] Cardiac and Pulmonary Rehab  Referring Provider  [name]      Encounter Date: 12/29/2017  Check In: Session Check In - 12/29/17 1007      Check-In   Location  [facility]-Cardiac & Pulmonary Rehab    Staff Present  Justin Mend RCP,RRT,BSRT;Krista Frederico Hamman, RN Vickki Hearing, BA, ACSM CEP, Exercise Physiologist    Supervising physician immediately available to respond to emergencies  [name] immediately available ER MD    Physician(s)  Dr. Quentin Cornwall and Jimmye Norman    Medication changes reported      No    Fall or balance concerns reported     No    Tobacco Cessation  No Change    Warm-up and Cool-down  Performed as group-led instruction    Resistance Training Performed  Yes    VAD Patient?  No      Pain Assessment   Currently in Pain?  No/denies        Exercise Prescription Changes - 12/29/17 1200      Response to Exercise   Blood Pressure (Admit)  118/54    Blood Pressure (Exit)  104/58    Heart Rate (Admit)  63 bpm    Heart Rate (Exercise)  78 bpm    Heart Rate (Exit)  63 bpm    Oxygen Saturation (Exercise)  95 %    Oxygen Saturation (Exit)  98 %    Rating of Perceived Exertion (Exercise)  15    Perceived Dyspnea (Exercise)  2    Symptoms  none    Duration  Continue with 45 min of aerobic exercise without signs/symptoms of physical distress.    Intensity  THRR unchanged      Progression   Progression  Continue to progress workloads to maintain intensity without signs/symptoms of physical distress.    Average METs  2.85      Resistance Training   Training Prescription  Yes    Weight  4 lb    Reps  10-15      Recumbant Bike   Level  5    RPM  60    Watts  34    Minutes  15    METs  3.4      T5 Nustep   Level  4    SPM  80    Minutes  15    METs  2.3      Home Exercise Plan   Plans to continue  exercise at  [employer] (comment) [place]    Frequency  Add 2 additional days to program exercise sessions.    Initial Home Exercises Provided  12/29/17       Social History   Tobacco Use  Smoking Status Former Smoker  . Packs/day: 2.00  . Years: 22.00  . Pack years: 44.00  . Types: Cigarettes  . Last attempt to quit: 05/22/1972  . Years since quitting: 45.6  Smokeless Tobacco Never Used    Goals Met:  Proper associated with RPD/PD & O2 Sat Independence with exercise equipment Exercise tolerated well Strength training completed today  Goals Unmet:  Not Applicable  Comments: Reviewed home exercise with pt today.  Pt plans to use gym at [address] [facility] for exercise.  Reviewed THR, pulse, RPE, sign and symptoms, NTG use, and when to call 911 or MD.  Also discussed  weather considerations and indoor options.  Pt voiced understanding.    Dr. Emily Filbert is Medical Director for [place] and [name].

## 2017-12-29 NOTE — Progress Notes (Signed)
Daily Session Note  Patient Details  Name: Caleb Khan MRN: [id number] Date of Birth: [birth year] Referring Provider:     Pulmonary Rehab from 11/30/2017 in [facility] Cardiac and Pulmonary Rehab  Referring Provider  [name]      Encounter Date: 12/29/2017  Check In: Session Check In - 12/29/17 1007      Check-In   Location  [facility]-Cardiac & Pulmonary Rehab    Staff Present  Justin Mend RCP,RRT,BSRT;Krista Frederico Hamman, RN Vickki Hearing, BA, ACSM CEP, Exercise Physiologist    Supervising physician immediately available to respond to emergencies  [name] immediately available ER MD    Physician(s)  Dr. Quentin Cornwall and Jimmye Norman    Medication changes reported      No    Fall or balance concerns reported     No    Tobacco Cessation  No Change    Warm-up and Cool-down  Performed as group-led instruction    Resistance Training Performed  Yes    VAD Patient?  No      Pain Assessment   Currently in Pain?  No/denies          Social History   Tobacco Use  Smoking Status Former Smoker  . Packs/day: 2.00  . Years: 22.00  . Pack years: 44.00  . Types: Cigarettes  . Last attempt to quit: 05/22/1972  . Years since quitting: 45.6  Smokeless Tobacco Never Used    Goals Met:  Independence with exercise equipment Exercise tolerated well No report of cardiac concerns or symptoms Strength training completed today  Goals Unmet:  Not Applicable  Comments: Pt able to follow exercise prescription today without complaint.  Will continue to monitor for progression.   Dr. Emily Filbert is Medical Director for [place] and [name].

## 2017-12-31 DIAGNOSIS — J449 Chronic obstructive pulmonary disease, unspecified: Secondary | ICD-10-CM | POA: Diagnosis not present

## 2017-12-31 NOTE — Progress Notes (Signed)
Daily Session Note  Patient Details  Name: Caleb Khan MRN: [id number] Date of Birth: Aug 09, 1927 Referring Provider:     Pulmonary Rehab from 11/30/2017 in [place] Cardiac and Pulmonary Rehab  Referring Provider  [name]      Encounter Date: 12/31/2017  Check In: Session Check In - 12/31/17 1016      Check-In   Location  [facility]-Cardiac & Pulmonary Rehab    Staff Present  Justin Mend RCP,RRT,BSRT;Mandi Axtell, BS, PEC;Laureen Owens Shark, BS, RRT, Respiratory Therapist    Supervising physician immediately available to respond to emergencies  [name] immediately available ER MD    Physician(s)  Dr.Malinda and Schaevitz    Medication changes reported      No    Fall or balance concerns reported     No    Tobacco Cessation  No Change    Warm-up and Cool-down  Performed as group-led instruction    Resistance Training Performed  Yes    VAD Patient?  No      Pain Assessment   Currently in Pain?  No/denies          Social History   Tobacco Use  Smoking Status Former Smoker  . Packs/day: 2.00  . Years: 22.00  . Pack years: 44.00  . Types: Cigarettes  . Last attempt to quit: 05/22/1972  . Years since quitting: 45.6  Smokeless Tobacco Never Used    Goals Met:  Independence with exercise equipment Exercise tolerated well No report of cardiac concerns or symptoms Strength training completed today  Goals Unmet:  Not Applicable  Comments: Pt able to follow exercise prescription today without complaint.  Will continue to monitor for progression.   Dr. Emily Filbert is Medical Director for [place] and [name].

## 2018-01-03 DIAGNOSIS — J449 Chronic obstructive pulmonary disease, unspecified: Secondary | ICD-10-CM | POA: Diagnosis not present

## 2018-01-03 NOTE — Progress Notes (Signed)
Daily Session Note  Patient Details  Name: Caleb Khan MRN: [id number] Date of Birth: September 07, 1927 Referring Provider:     Pulmonary Rehab from 11/30/2017 in [facility] Cardiac and Pulmonary Rehab  Referring Provider  [name]      Encounter Date: 01/03/2018  Check In: Session Check In - 01/03/18 0954      Check-In   Location  [facility]-Cardiac & Pulmonary Rehab    Staff Present  Justin Mend RCP,RRT,BSRT;Mandi [place], BS, Dennis Bast, BS, ACSM CEP, Exercise Physiologist    Supervising physician immediately available to respond to emergencies  [name] immediately available ER MD    Physician(s)  Dr. Alfred Levins and Burlene Arnt    Medication changes reported      No    Fall or balance concerns reported     No    Tobacco Cessation  No Change    Warm-up and Cool-down  Performed as group-led instruction    Resistance Training Performed  Yes    VAD Patient?  No      Pain Assessment   Currently in Pain?  No/denies          Social History   Tobacco Use  Smoking Status Former Smoker  . Packs/day: 2.00  . Years: 22.00  . Pack years: 44.00  . Types: Cigarettes  . Last attempt to quit: 05/22/1972  . Years since quitting: 45.6  Smokeless Tobacco Never Used    Goals Met:  Independence with exercise equipment Exercise tolerated well No report of cardiac concerns or symptoms Strength training completed today  Goals Unmet:  Not Applicable  Comments: Pt able to follow exercise prescription today without complaint.  Will continue to monitor for progression.   Dr. Emily Filbert is Medical Director for [place] and [name].

## 2018-01-05 DIAGNOSIS — J449 Chronic obstructive pulmonary disease, unspecified: Secondary | ICD-10-CM

## 2018-01-05 NOTE — Progress Notes (Signed)
Daily Session Note  Patient Details  Name: Caleb Khan MRN: [id number] Date of Birth: 03/18/1928 Referring Provider:     Pulmonary Rehab from 11/30/2017 in [facility] and Pul[facility] Rehab  Referring Provider  Fleming      Encounter Date: 01/05/2018  Check In: Session Check In - 01/05/18 1007      Check-In   Location  [facility]    Staff Present  Joseph Hood RCP,RRT,BSRT;Jessica Hawkins, MA, RCEP, CCRP, Exercise Physiologist;Amanda Sommer, BA, ACSM CEP, Exercise Physiologist    Supervising physician immediately available to respond to emergencies  [name] immediately available ER MD    Physician(s)  Dr. Paduchowski and Stafford    Medication changes reported      No    Fall or balance concerns reported     No    Tobacco Cessation  No Change    Warm-up and Cool-down  Performed as group-led instruction    Resistance Training Performed  Yes    VAD Patient?  No      Pain Assessment   Currently in Pain?  No/denies          Social History   Tobacco Use  Smoking Status Former Smoker  . Packs/day: 2.00  . Years: 22.00  . Pack years: 44.00  . Types: Cigarettes  . Last attempt to quit: 05/22/1972  . Years since quitting: 45.6  Smokeless Tobacco Never Used    Goals Met:  Independence with exercise equipment Exercise tolerated well No report of cardiac concerns or symptoms Strength training completed today  Goals Unmet:  Not Applicable  Comments: Pt able to follow exercise prescription today without complaint.  Will continue to monitor for progression.   Dr. Mark Miller is Medical Director for [facility] and [name]. 

## 2018-01-07 DIAGNOSIS — J449 Chronic obstructive pulmonary disease, unspecified: Secondary | ICD-10-CM | POA: Diagnosis not present

## 2018-01-07 NOTE — Progress Notes (Signed)
Daily Session Note  Patient Details  Name: Caleb Khan MRN: [id number] Date of Birth: January 26, 1928 Referring Provider:     Pulmonary Rehab from 11/30/2017 in [facility] and Pulmonary Rehab  Referring Provider  [name]      Encounter Date: 01/07/2018  Check In: Session Check In - 01/07/18 0951      Check-In   Location  [facility]    Staff Present  Justin Mend RCP,RRT,BSRT;Amanda Oletta Darter, BA, ACSM CEP, Exercise Physiologist;Meredith Sherryll Burger, RN BSN    Supervising physician immediately available to respond to emergencies  [name] immediately available ER MD    Physician(s)  Dr. Corky Downs and Joni Fears    Medication changes reported      No    Fall or balance concerns reported     No    Tobacco Cessation  No Change    Warm-up and Cool-down  Performed as group-led instruction    Resistance Training Performed  Yes    VAD Patient?  No      Pain Assessment   Currently in Pain?  No/denies          Social History   Tobacco Use  Smoking Status Former Smoker  . Packs/day: 2.00  . Years: 22.00  . Pack years: 44.00  . Types: Cigarettes  . Last attempt to quit: 05/22/1972  . Years since quitting: 45.6  Smokeless Tobacco Never Used    Goals Met:  Independence with exercise equipment Exercise tolerated well Personal goals reviewed No report of cardiac concerns or symptoms Strength training completed today  Goals Unmet:  Not Applicable  Comments: Pt able to follow exercise prescription today without complaint.  Will continue to monitor for progression.   Dr. Emily Filbert is Medical Director for [place] and [name].

## 2018-01-10 DIAGNOSIS — J449 Chronic obstructive pulmonary disease, unspecified: Secondary | ICD-10-CM | POA: Diagnosis not present

## 2018-01-10 NOTE — Progress Notes (Signed)
Daily Session Note  Patient Details  Name: Caleb Khan MRN: [id number] Date of Birth: 07/24/1928 Referring Provider:     Pulmonary Rehab from 11/30/2017 in [facility] Cardiac and Pulmonary Rehab  Referring Provider  [name]      Encounter Date: 01/10/2018  Check In: Session Check In - 01/10/18 1013      Check-In   Location  [facility]    Staff Present  Justin Mend RCP,RRT,BSRT;Amanda Oletta Darter, BA, ACSM CEP, Exercise Physiologist;Kelly Amedeo Plenty, BS, ACSM CEP, Exercise Physiologist    Supervising physician immediately available to respond to emergencies  [name] immediately available ER MD    Physician(s)  Dr. Corky Downs and Clearnce Hasten    Medication changes reported      No    Fall or balance concerns reported     No    Tobacco Cessation  No Change    Warm-up and Cool-down  Performed as group-led instruction    Resistance Training Performed  Yes    VAD Patient?  No      Pain Assessment   Currently in Pain?  No/denies          Social History   Tobacco Use  Smoking Status Former Smoker  . Packs/day: 2.00  . Years: 22.00  . Pack years: 44.00  . Types: Cigarettes  . Last attempt to quit: 05/22/1972  . Years since quitting: 45.6  Smokeless Tobacco Never Used    Goals Met:  Independence with exercise equipment Exercise tolerated well No report of cardiac concerns or symptoms Strength training completed today  Goals Unmet:  Not Applicable  Comments: Pt able to follow exercise prescription today without complaint.  Will continue to monitor for progression.   Dr. Emily Filbert is Medical Director for [place] and [name].

## 2018-01-12 DIAGNOSIS — J449 Chronic obstructive pulmonary disease, unspecified: Secondary | ICD-10-CM | POA: Diagnosis not present

## 2018-01-12 NOTE — Progress Notes (Signed)
Daily Session Note  Patient Details  Name: Caleb Khan MRN: [id number] Date of Birth: 09-Aug-1927 Referring Provider:     Pulmonary Rehab from 11/30/2017 in [facility] Cardiac and Pulmonary Rehab  Referring Provider  [name]      Encounter Date: 01/12/2018  Check In: Session Check In - 01/12/18 1009      Check-In   Location  [facility]    Staff Present  Justin Mend Lorre Nick, MA, RCEP, CCRP, Exercise Physiologist;Amanda Oletta Darter, [place], ACSM CEP, Exercise Physiologist    Supervising physician immediately available to respond to emergencies  [name] immediately available ER MD    Physician(s)  Dr. Jimmye Norman and Corky Downs    Medication changes reported      No    Fall or balance concerns reported     No    Tobacco Cessation  No Change    Warm-up and Cool-down  Performed as group-led instruction    Resistance Training Performed  Yes    VAD Patient?  No      Pain Assessment   Currently in Pain?  No/denies          Social History   Tobacco Use  Smoking Status Former Smoker  . Packs/day: 2.00  . Years: 22.00  . Pack years: 44.00  . Types: Cigarettes  . Last attempt to quit: 05/22/1972  . Years since quitting: 45.6  Smokeless Tobacco Never Used    Goals Met:  Independence with exercise equipment Exercise tolerated well No report of cardiac concerns or symptoms Strength training completed today  Goals Unmet:  Not Applicable  Comments: Pt able to follow exercise prescription today without complaint.  Will continue to monitor for progression.   Dr. Emily Filbert is Medical Director for [place] and [name].

## 2018-01-17 DIAGNOSIS — J449 Chronic obstructive pulmonary disease, unspecified: Secondary | ICD-10-CM | POA: Diagnosis not present

## 2018-01-17 NOTE — Progress Notes (Signed)
Daily Session Note  Patient Details  Name: Crescencio Jozwiak MRN: [id number] Date of Birth: 1928-07-20 Referring Provider:     Pulmonary Rehab from 11/30/2017 in [facility] Cardiac and Pulmonary Rehab  Referring Provider  [name]      Encounter Date: 01/17/2018  Check In: Session Check In - 01/17/18 1025      Check-In   Location  [facility]    Staff Present  Justin Mend RCP,RRT,BSRT;Amanda Oletta Darter, BA, ACSM CEP, Exercise Physiologist;Kelly Amedeo Plenty, BS, ACSM CEP, Exercise Physiologist    Supervising physician immediately available to respond to emergencies  [name] immediately available ER MD    Physician(s)  Dr. Jimmye Norman and Cinda Quest    Medication changes reported      No    Fall or balance concerns reported     No    Tobacco Cessation  No Change    Warm-up and Cool-down  Performed as group-led instruction    Resistance Training Performed  Yes    VAD Patient?  No      Pain Assessment   Currently in Pain?  No/denies          Social History   Tobacco Use  Smoking Status Former Smoker  . Packs/day: 2.00  . Years: 22.00  . Pack years: 44.00  . Types: Cigarettes  . Last attempt to quit: 05/22/1972  . Years since quitting: 45.6  Smokeless Tobacco Never Used    Goals Met:  Independence with exercise equipment Exercise tolerated well No report of cardiac concerns or symptoms Strength training completed today  Goals Unmet:  Not Applicable  Comments: Pt able to follow exercise prescription today without complaint.  Will continue to monitor for progression.   Dr. Emily Filbert is Medical Director for [place] and [name].

## 2018-01-19 DIAGNOSIS — J449 Chronic obstructive pulmonary disease, unspecified: Secondary | ICD-10-CM

## 2018-01-19 NOTE — Progress Notes (Signed)
Daily Session Note  Patient Details  Name: Caleb Khan MRN: [id number] Date of Birth: Nov 04, 1927 Referring Provider:     Pulmonary Rehab from 11/30/2017 in [facility] Cardiac and Pulmonary Rehab  Referring Provider  Caleb Khan      Encounter Date: 01/19/2018  Check In: Session Check In - 01/19/18 1022      Check-In   Location  [facility]    Staff Present  Justin Mend Lorre Nick, MA, RCEP, CCRP, Exercise Physiologist;Amanda Oletta Darter, [place], ACSM CEP, Exercise Physiologist    Supervising physician immediately available to respond to emergencies  [name] immediately available ER MD    Physician(s)  Dr. Jimmye Norman and Quentin Cornwall    Medication changes reported      No    Fall or balance concerns reported     No    Tobacco Cessation  No Change    Warm-up and Cool-down  Performed as group-led instruction    Resistance Training Performed  Yes    VAD Patient?  No    PAD/SET Patient?  No      Pain Assessment   Currently in Pain?  No/denies          Social History   Tobacco Use  Smoking Status Former Smoker  . Packs/day: 2.00  . Years: 22.00  . Pack years: 44.00  . Types: Cigarettes  . Last attempt to quit: 05/22/1972  . Years since quitting: 45.6  Smokeless Tobacco Never Used    Goals Met:  Independence with exercise equipment Exercise tolerated well No report of cardiac concerns or symptoms Strength training completed today  Goals Unmet:  Not Applicable  Comments: Pt able to follow exercise prescription today without complaint.  Will continue to monitor for progression.   Dr. Emily Filbert is Medical Director for [place] and [name].

## 2018-01-21 DIAGNOSIS — J449 Chronic obstructive pulmonary disease, unspecified: Secondary | ICD-10-CM

## 2018-01-21 NOTE — Progress Notes (Signed)
Daily Session Note  Patient Details  Name: Caleb Khan MRN: [id number] Date of Birth: 04/05/1928 Referring Provider:     Pulmonary Rehab from 11/30/2017 in [facility] and [facility] Re[facility]  Referring Provider  Fleming      Encounter Date: 01/21/2018  Check In: Session Check In - 01/21/18 1003      Check-In   Location  [facility]    Staff Present  Joseph Hood RCP,RRT,BSRT;Amanda Sommer, BA, ACSM CEP, Exercise Physiologist;Meredith Craven, RN BSN    Supervising physician immediately available to respond to emergencies  [name] immediately available ER MD    Physician(s)  Dr. Siadecki and Quale    Medication changes reported      No    Fall or balance concerns reported     No    Tobacco Cessation  No Change    Warm-up and Cool-down  Performed as group-led instruction    Resistance Training Performed  Yes    VAD Patient?  No    PAD/SET Patient?  No      Pain Assessment   Currently in Pain?  No/denies          Social History   Tobacco Use  Smoking Status Former Smoker  . Packs/day: 2.00  . Years: 22.00  . Pack years: 44.00  . Types: Cigarettes  . Last attempt to quit: 05/22/1972  . Years since quitting: 45.6  Smokeless Tobacco Never Used    Goals Met:  Independence with exercise equipment Exercise tolerated well No report of cardiac concerns or symptoms Strength training completed today  Goals Unmet:  Not Applicable  Comments: Pt able to follow exercise prescription today without complaint.  Will continue to monitor for progression.   Dr. Mark Miller is Medical Director for [facility] and [name]. 

## 2018-01-24 ENCOUNTER — Encounter: Payer: Medicare PPO | Attending: Specialist

## 2018-01-24 DIAGNOSIS — J449 Chronic obstructive pulmonary disease, unspecified: Secondary | ICD-10-CM | POA: Insufficient documentation

## 2018-01-24 NOTE — Progress Notes (Signed)
Pulmonary Individual Treatment Plan  Patient Details  Name: Caleb Khan MRN: [id number] Date of Birth: 23-Oct-1927 Referring Provider:     Pulmonary Rehab from 11/30/2017 in [facility] Cardiac and Pulmonary Rehab  Referring Provider  [name]      Initial Encounter Date:    Pulmonary Rehab from 11/30/2017 in [facility] Cardiac and Pulmonary Rehab  Date  11/30/17      Visit Diagnosis: Chronic obstructive pulmonary disease, unspecified COPD type ([place])  Patient's Home Medications on Admission:  Current Outpatient Medications:  .  amLODipine (NORVASC) 5 MG tablet, Take 5 mg by mouth 2 (two) times daily. , Disp: , Rfl:  .  aspirin EC 81 MG tablet, Take 81 mg by mouth daily., Disp: , Rfl:  .  CALCIUM-VITAMIN D PO, Take 1 tablet by mouth daily., Disp: , Rfl:  .  docusate sodium (COLACE) 100 MG capsule, Take 200 mg by mouth daily as needed for mild constipation., Disp: , Rfl:  .  HYDROcodone-acetaminophen (NORCO) 5-325 MG tablet, Take 1-2 tablets by mouth every 6 (six) hours as needed for moderate pain., Disp: 30 tablet, Rfl: 0 .  losartan (COZAAR) 25 MG tablet, Take 25 mg by mouth daily., Disp: , Rfl:  .  Multiple Vitamins-Minerals (MULTIVITAMIN PO), Take 1 tablet by mouth daily., Disp: , Rfl:  .  nitroGLYCERIN (NITROSTAT) 0.4 MG SL tablet, Place 0.4 mg under the tongue every 5 (five) minutes as needed for chest pain., Disp: , Rfl:  .  predniSONE (DELTASONE) 10 MG tablet, Take 10 mg by mouth daily with breakfast., Disp: , Rfl:  .  Red Yeast Rice Extract (RED YEAST RICE PO), Take 1 capsule by mouth 2 (two) times daily. , Disp: , Rfl:  .  terazosin (HYTRIN) 10 MG capsule, Take 10 mg by mouth at bedtime., Disp: , Rfl:   Past Medical History: Past Medical History:  Diagnosis Date  . Anemia   . Anginal pain ([place])   . Arthritis    osteoarthritis  . CHF (congestive heart failure) ([place])   . Chronic kidney disease   . Coronary artery disease   . Dyspnea    with exertion  . Hypertension     Tobacco  Use: Social History   Tobacco Use  Smoking Status Former Smoker  . Packs/day: 2.00  . Years: 22.00  . Pack years: 44.00  . Types: Cigarettes  . Last attempt to quit: 05/22/1972  . Years since quitting: 45.7  Smokeless Tobacco Never Used    Labs: Recent Review Flowsheet Data    There is no flowsheet data to display.       Pulmonary Assessment Scores: Pulmonary Assessment Scores    Row Name 11/30/17 1106         ADL UCSD   ADL Phase  Entry     SOB Score total  33     Rest  0     Walk  2     Stairs  4     Bath  0     Dress  1     Shop  2       CAT Score   CAT Score  17       mMRC Score   mMRC Score  1        Pulmonary Function Assessment: Pulmonary Function Assessment - 11/30/17 1124      Initial Spirometry Results   FVC%  134 %    FEV1%  152 %    FEV1/FVC Ratio  76.13  Comments  best of 2 good patient effort      Post Bronchodilator Spirometry Results   FVC%  129.98 %    FEV1%  155.04 %    FEV1/FVC Ratio  80.41    Comments  best of 2 good patient effort      Breath   Bilateral Breath Sounds  Clear    Shortness of Breath  Yes;Limiting activity       Exercise Target Goals:    Exercise Program Goal: Individual exercise prescription set using results from initial 6 min walk test and THRR while considering  patient's activity barriers and safety.    Exercise Prescription Goal: Initial exercise prescription builds to 30-45 minutes a day of aerobic activity, 2-3 days per week.  Home exercise guidelines will be given to patient during program as part of exercise prescription that the participant will acknowledge.  Activity Barriers & Risk Stratification:   6 Minute Walk: 6 Minute Walk    Row Name 11/30/17 1229         6 Minute Walk   Distance  1360 feet     Walk Time  6 minutes     # of Rest Breaks  0     MPH  2.58     METS  2.19     RPE  13     Perceived Dyspnea   2     VO2 Peak  7.66     Symptoms  No     Resting HR  64 bpm      Resting BP  120/54     Resting Oxygen Saturation   97 %     Exercise Oxygen Saturation  during 6 min walk  94 %     Max Ex. HR  108 bpm     Max Ex. BP  132/58     2 Minute Post BP  126/58       Interval HR   1 Minute HR  70     2 Minute HR  78     3 Minute HR  77     4 Minute HR  92     5 Minute HR  108     6 Minute HR  103     2 Minute Post HR  80     Interval Heart Rate?  Yes       Interval Oxygen   Interval Oxygen?  Yes     Baseline Oxygen Saturation %  97 %     1 Minute Oxygen Saturation %  95 %     1 Minute Liters of Oxygen  0 L     2 Minute Oxygen Saturation %  94 %     2 Minute Liters of Oxygen  0 L     3 Minute Oxygen Saturation %  94 %     3 Minute Liters of Oxygen  0 L     4 Minute Oxygen Saturation %  94 %     4 Minute Liters of Oxygen  0 L     5 Minute Oxygen Saturation %  94 %     5 Minute Liters of Oxygen  0 L     6 Minute Oxygen Saturation %  95 %     6 Minute Liters of Oxygen  0 L     2 Minute Post Oxygen Saturation %  97 %     2 Minute Post Liters of Oxygen  0 L  Oxygen Initial Assessment: Oxygen Initial Assessment - 11/30/17 1121      Home Oxygen   Home Oxygen Device  Home Concentrator    Sleep Oxygen Prescription  Continuous    Liters per minute  2    Home Exercise Oxygen Prescription  None    Home at Rest Exercise Oxygen Prescription  None    Compliance with Home Oxygen Use  Yes      Initial 6 min Walk   Oxygen Used  None      Program Oxygen Prescription   Program Oxygen Prescription  None      Intervention   Short Term Goals  To learn and understand importance of monitoring SPO2 with pulse oximeter and demonstrate accurate use of the pulse oximeter.;To learn and understand importance of maintaining oxygen saturations>88%;To learn and demonstrate proper pursed lip breathing techniques or other breathing techniques.;To learn and exhibit compliance with exercise, home and travel O2 prescription    Long  Term Goals  Exhibits compliance  with exercise, home and travel O2 prescription;Verbalizes importance of monitoring SPO2 with pulse oximeter and return demonstration;Maintenance of O2 saturations>88%;Exhibits proper breathing techniques, such as pursed lip breathing or other method taught during program session       Oxygen Re-Evaluation: Oxygen Re-Evaluation    Row Name 12/06/17 1043 12/13/17 1102 01/07/18 1029         Program Oxygen Prescription   Program Oxygen Prescription  None  None  None       Home Oxygen   Home Oxygen Device  Home Concentrator  Home Concentrator  Home Concentrator     Sleep Oxygen Prescription  Continuous  Continuous  Continuous     Liters per minute  '2  2  2     '$ Home Exercise Oxygen Prescription  None  None  None     Home at Rest Exercise Oxygen Prescription  None  None  None     Compliance with Home Oxygen Use  Yes  Yes  Yes       Goals/Expected Outcomes   Short Term Goals  To learn and demonstrate proper pursed lip breathing techniques or other breathing techniques.;To learn and understand importance of monitoring SPO2 with pulse oximeter and demonstrate accurate use of the pulse oximeter.;To learn and understand importance of maintaining oxygen saturations>88%;To learn and exhibit compliance with exercise, home and travel O2 prescription  To learn and exhibit compliance with exercise, home and travel O2 prescription;To learn and understand importance of monitoring SPO2 with pulse oximeter and demonstrate accurate use of the pulse oximeter.;To learn and understand importance of maintaining oxygen saturations>88%;To learn and demonstrate proper pursed lip breathing techniques or other breathing techniques.;To learn and demonstrate proper use of respiratory medications  To learn and exhibit compliance with exercise, home and travel O2 prescription;To learn and understand importance of monitoring SPO2 with pulse oximeter and demonstrate accurate use of the pulse oximeter.;To learn and understand  importance of maintaining oxygen saturations>88%;To learn and demonstrate proper pursed lip breathing techniques or other breathing techniques.     Long  Term Goals  -  Exhibits compliance with exercise, home and travel O2 prescription;Verbalizes importance of monitoring SPO2 with pulse oximeter and return demonstration;Maintenance of O2 saturations>88%;Exhibits proper breathing techniques, such as pursed lip breathing or other method taught during program session;Compliance with respiratory medication;Demonstrates proper use of MDI's  Exhibits compliance with exercise, home and travel O2 prescription;Verbalizes importance of monitoring SPO2 with pulse oximeter and return demonstration;Maintenance of O2 saturations>88%;Exhibits proper breathing techniques,  such as pursed lip breathing or other method taught during program session     Comments  Reviewed PLB with patient. He demonstrated understanding of these concepts.   Reviewed PLB with patient. He demonstrated understanding of these concepts and stated he does [place] PLB at home when he gets SOB.   Little is doing well with his PLB. He states when he is breathing hard he automatically does it. Incline and stairs are the things that makes him short of breath. He is wearing is 2 liter nasal cannula for sleep everynight.      Goals/Expected Outcomes  Short: Patient will use PLB during exercise to control SOB Long: Patient will become independent with PLB to help control SOB with ADL's.   Short: Patient will use PLB during exercise to control SOB Long: Patient will become independent with PLB to help control SOB with ADL's.   Short: increase incline to help shob while walking up stairs. Long: Maintain incline and improve Shortness of breath when going up stairs.        Oxygen Discharge (Final Oxygen Re-Evaluation): Oxygen Re-Evaluation - 01/07/18 1029      Program Oxygen Prescription   Program Oxygen Prescription  None      Home Oxygen   Home Oxygen Device   Home Concentrator    Sleep Oxygen Prescription  Continuous    Liters per minute  2    Home Exercise Oxygen Prescription  None    Home at Rest Exercise Oxygen Prescription  None    Compliance with Home Oxygen Use  Yes      Goals/Expected Outcomes   Short Term Goals  To learn and exhibit compliance with exercise, home and travel O2 prescription;To learn and understand importance of monitoring SPO2 with pulse oximeter and demonstrate accurate use of the pulse oximeter.;To learn and understand importance of maintaining oxygen saturations>88%;To learn and demonstrate proper pursed lip breathing techniques or other breathing techniques.    Long  Term Goals  Exhibits compliance with exercise, home and travel O2 prescription;Verbalizes importance of monitoring SPO2 with pulse oximeter and return demonstration;Maintenance of O2 saturations>88%;Exhibits proper breathing techniques, such as pursed lip breathing or other method taught during program session    Comments  Endre is doing well with his PLB. He states when he is breathing hard he automatically does it. Incline and stairs are the things that makes him short of breath. He is wearing is 2 liter nasal cannula for sleep everynight.     Goals/Expected Outcomes  Short: increase incline to help shob while walking up stairs. Long: Maintain incline and improve Shortness of breath when going up stairs.       Initial Exercise Prescription: Initial Exercise Prescription - 11/30/17 1200      Date of Initial Exercise RX and Referring Provider   Date  11/30/17    Referring Provider  [name]      Treadmill   MPH  2    Grade  0.5    Minutes  15    METs  2.6      Recumbant Bike   Level  2    RPM  60    Watts  10    Minutes  15    METs  2.4      REL-XR   Level  2    Speed  50    Minutes  15    METs  2.4      T5 Nustep   Level  2  SPM  80    Minutes  15    METs  2.4      Prescription Details   Frequency (times per week)  3    Duration   Progress to 45 minutes of aerobic exercise without signs/symptoms of physical distress      Intensity   THRR 40-80% of Max Heartrate  92-119    Ratings of Perceived Exertion  11-15    Perceived Dyspnea  0-4      Resistance Training   Training Prescription  Yes    Weight  4 lb    Reps  10-15       Perform Capillary Blood Glucose checks as needed.  Exercise Prescription Changes: Exercise Prescription Changes    Row Name 12/14/17 1300 12/29/17 1200 01/12/18 1400         Response to Exercise   Blood Pressure (Admit)  112/60  118/54  116/64     Blood Pressure (Exercise)  130/70  -  -     Blood Pressure (Exit)  130/80  104/58  126/62     Heart Rate (Admit)  64 bpm  63 bpm  64 bpm     Heart Rate (Exercise)  90 bpm  78 bpm  76 bpm     Heart Rate (Exit)  62 bpm  63 bpm  54 bpm     Oxygen Saturation (Admit)  98 %  -  97 %     Oxygen Saturation (Exercise)  97 %  95 %  96 %     Oxygen Saturation (Exit)  93 %  98 %  94 %     Rating of Perceived Exertion (Exercise)  '14  15  14     '$ Perceived Dyspnea (Exercise)  '2  2  2     '$ Symptoms  none  none  none     Duration  Continue with 45 min of aerobic exercise without signs/symptoms of physical distress.  Continue with 45 min of aerobic exercise without signs/symptoms of physical distress.  Continue with 45 min of aerobic exercise without signs/symptoms of physical distress.     Intensity  THRR New  THRR unchanged  THRR New reaching RPE of 14 - THR R + 30       Progression   Progression  Continue to progress workloads to maintain intensity without signs/symptoms of physical distress.  Continue to progress workloads to maintain intensity without signs/symptoms of physical distress.  Continue to progress workloads to maintain intensity without signs/symptoms of physical distress.     Average METs  2.8  2.85  3.2       Resistance Training   Training Prescription  Yes  Yes  Yes     Weight  4 lb  4 lb  4 lb     Reps  10-15  10-15  10-15        Treadmill   MPH  2  -  2.5     Grade  3  -  3     Minutes  15  -  15     METs  3.3  -  3.95       Recumbant Bike   Level  -  5  -     RPM  -  60  -     Watts  -  34  -     Minutes  -  15  -     METs  -  3.4  -  T5 Nustep   Level  '5  4  4     '$ SPM  80  80  80     Minutes  '15  15  15     '$ METs  2.3  2.3  2.4       Home Exercise Plan   Plans to continue exercise at  [facility] (comment) [place]  -     Frequency  -  Add 2 additional days to program exercise sessions.  -     Initial Home Exercises Provided  -  12/29/17  -        Exercise Comments: Exercise Comments    Row Name 12/06/17 1041 12/29/17 1223         Exercise Comments   First full day of exercise!  Patient was oriented to gym and equipment including functions, settings, policies, and procedures.  Patient's individual exercise prescription and treatment plan were reviewed.  All starting workloads were established based on the results of the 6 minute walk test done at initial orientation visit.  The plan for exercise progression was also introduced and progression will be customized based on patient's performance and goals.  Reviewed home exercise with pt today.  Pt plans to use gym at [facility] for exercise.  Reviewed THR, pulse, RPE, sign and symptoms, NTG use, and when to call 911 or MD.  Also discussed weather considerations and indoor options.  Pt voiced understanding.         Exercise Goals and Review: Exercise Goals    Row Name 11/30/17 1228             Exercise Goals   Increase Physical Activity  Yes       Intervention  Provide advice, education, support and counseling about physical activity/exercise needs.;Develop an individualized exercise prescription for aerobic and resistive training based on initial evaluation findings, risk stratification, comorbidities and participant's personal goals.       Expected Outcomes  Short Term: Attend rehab on a regular basis to increase amount of  physical activity.;Long Term: Add in home exercise to make exercise part of routine and to increase amount of physical activity.;Long Term: Exercising regularly at least 3-5 days a week.       Increase Strength and Stamina  Yes       Intervention  Provide advice, education, support and counseling about physical activity/exercise needs.;Develop an individualized exercise prescription for aerobic and resistive training based on initial evaluation findings, risk stratification, comorbidities and participant's personal goals.       Expected Outcomes  Short Term: Increase workloads from initial exercise prescription for resistance, speed, and METs.;Short Term: Perform resistance training exercises routinely during rehab and add in resistance training at home;Long Term: Improve cardiorespiratory fitness, muscular endurance and strength as measured by increased METs and functional capacity (6MWT)       Able to understand and use rate of perceived exertion (RPE) scale  Yes       Intervention  Provide education and explanation on how to use RPE scale       Expected Outcomes  Short Term: Able to use RPE daily in rehab to express subjective intensity level;Long Term:  Able to use RPE to guide intensity level when exercising independently       Able to understand and use Dyspnea scale  Yes       Intervention  Provide education and explanation on how to use Dyspnea scale  Expected Outcomes  Short Term: Able to use Dyspnea scale daily in rehab to express subjective sense of shortness of breath during exertion;Long Term: Able to use Dyspnea scale to guide intensity level when exercising independently       Knowledge and understanding of Target Heart Rate Range (THRR)  Yes       Intervention  Provide education and explanation of THRR including how the numbers were predicted and where they are located for reference       Expected Outcomes  Short Term: Able to state/look up THRR;Short Term: Able to use daily as  guideline for intensity in rehab;Long Term: Able to use THRR to govern intensity when exercising independently       Able to check pulse independently  Yes       Intervention  Provide education and demonstration on how to check pulse in carotid and radial arteries.;Review the importance of being able to check your own pulse for safety during independent exercise       Expected Outcomes  Short Term: Able to explain why pulse checking is important during independent exercise;Long Term: Able to check pulse independently and accurately       Understanding of Exercise Prescription  Yes       Intervention  Provide education, explanation, and written materials on patient's individual exercise prescription       Expected Outcomes  Short Term: Able to explain program exercise prescription;Long Term: Able to explain home exercise prescription to exercise independently          Exercise Goals Re-Evaluation : Exercise Goals Re-Evaluation    Row Name 12/06/17 1041 12/13/17 1050 12/14/17 1323 12/29/17 1223 01/12/18 1449     Exercise Goal Re-Evaluation   Exercise Goals Review  Increase Physical Activity;Increase Strength and Stamina;Able to understand and use rate of perceived exertion (RPE) scale;Knowledge and understanding of Target Heart Rate Range (THRR);Able to understand and use Dyspnea scale;Understanding of Exercise Prescription  Increase Physical Activity;Increase Strength and Stamina;Able to understand and use rate of perceived exertion (RPE) scale;Able to understand and use Dyspnea scale;Knowledge and understanding of Target Heart Rate Range (THRR);Understanding of Exercise Prescription  Increase Physical Activity;Increase Strength and Stamina;Able to understand and use Dyspnea scale;Able to understand and use rate of perceived exertion (RPE) scale  Increase Physical Activity;Increase Strength and Stamina;Able to understand and use rate of perceived exertion (RPE) scale;Able to understand and use Dyspnea  scale;Knowledge and understanding of Target Heart Rate Range (THRR);Able to check pulse independently;Understanding of Exercise Prescription  Increase Physical Activity;Able to understand and use rate of perceived exertion (RPE) scale;Understanding of Exercise Prescription;Increase Strength and Stamina;Able to understand and use Dyspnea scale   Comments  Reviewed RPE scale, THR and program prescription with pt today.  Pt voiced understanding and was given a copy of goals to take home.   Jamoni feels like he is getting a good start to the program and has had no complications with his exercise so far. He also walks at home.  Dequandre has tolerated exercise well in first sessions.  He has increased incline on TM.  Staff will monitor progress.  Reviewed home exercise with pt today.  Pt plans to use gym at [facility] for exercise.  Reviewed THR, pulse, RPE, sign and symptoms, NTG use, and when to call 911 or MD.  Also discussed weather considerations and indoor options.  Pt voiced understanding.  Jovoni has increased overall MET level.  Staff will monitor progress   Expected Outcomes  Short: Use  RPE daily to regulate intensity.  Long: Follow program prescription in THR.  Short: attend Pulmonary rehab on a regular basis. Long: Increase met level and improve SOB.   Short - Pt will attend regularly Long - Pt will improve MET level  Short - Pt will exercise on days not at [facility] and monitor HR and 02 - will purchase oximeter for home Long - Pt will maintain exercise gains after completing LW  Short - Leeon will continue to attend regularly Long - Rameses will continue to improve MET level      Discharge Exercise Prescription (Final Exercise Prescription Changes): Exercise Prescription Changes - 01/12/18 1400      Response to Exercise   Blood Pressure (Admit)  116/64    Blood Pressure (Exit)  126/62    Heart Rate (Admit)  64 bpm    Heart Rate (Exercise)  76 bpm    Heart Rate (Exit)  54 bpm    Oxygen Saturation (Admit)  97 %     Oxygen Saturation (Exercise)  96 %    Oxygen Saturation (Exit)  94 %    Rating of Perceived Exertion (Exercise)  14    Perceived Dyspnea (Exercise)  2    Symptoms  none    Duration  Continue with 45 min of aerobic exercise without signs/symptoms of physical distress.    Intensity  THRR New reaching RPE of 14 - THR R + 30      Progression   Progression  Continue to progress workloads to maintain intensity without signs/symptoms of physical distress.    Average METs  3.2      Resistance Training   Training Prescription  Yes    Weight  4 lb    Reps  10-15      Treadmill   MPH  2.5    Grade  3    Minutes  15    METs  3.95      T5 Nustep   Level  4    SPM  80    Minutes  15    METs  2.4       Nutrition:  Target Goals: Understanding of nutrition guidelines, daily intake of sodium '1500mg'$ , cholesterol '200mg'$ , calories 30% from fat and 7% or less from saturated fats, daily to have 5 or more servings of fruits and vegetables.  Biometrics: Pre Biometrics - 11/30/17 1228      Pre Biometrics   Height  5' 5.5" (1.664 m)    Weight  161 lb 11.2 oz (73.3 kg)    Waist Circumference  36.5 inches    Hip Circumference  38.5 inches    Waist to Hip Ratio  0.95 %    BMI (Calculated)  26.49        Nutrition Therapy Plan and Nutrition Goals: Nutrition Therapy & Goals - 11/30/17 1119      Personal Nutrition Goals   Comments  Lose a little weight, Learn how to eat healthier      Intervention Plan   Intervention  Prescribe, educate and counsel regarding individualized specific dietary modifications aiming towards targeted core components such as weight, hypertension, lipid management, diabetes, heart failure and other comorbidities.;Nutrition handout(s) given to patient.    Expected Outcomes  Short Term Goal: Understand basic principles of dietary content, such as calories, fat, sodium, cholesterol and nutrients.;Long Term Goal: Adherence to prescribed nutrition plan.        Nutrition Assessments: Nutrition Assessments - 11/30/17 1106  MEDFICTS Scores   Pre Score  49       Nutrition Goals Re-Evaluation: Nutrition Goals Re-Evaluation    Sellers Name 12/13/17 1100 01/07/18 1034           Goals   Current Weight  -  157 lb (71.2 kg)      Nutrition Goal  -  He wants to be 150 pounds.       Comment  Set Cristo up for a dietician appointment on 12/22/17  He has cut out desserts and it has been helping him lose weight. His wife cooks healthy and they both watch what they eat.      Expected Outcome  Short: Meet with dietician Long: follow nutician goals set during dietician meeting.   Short: lose 3-5 pounds more. Long: Maintain weight loss         Nutrition Goals Discharge (Final Nutrition Goals Re-Evaluation): Nutrition Goals Re-Evaluation - 01/07/18 1034      Goals   Current Weight  157 lb (71.2 kg)    Nutrition Goal  He wants to be 150 pounds.     Comment  He has cut out desserts and it has been helping him lose weight. His wife cooks healthy and they both watch what they eat.    Expected Outcome  Short: lose 3-5 pounds more. Long: Maintain weight loss       Psychosocial: Target Goals: Acknowledge presence or absence of significant depression and/or stress, maximize coping skills, provide positive support system. Participant is able to verbalize types and ability to use techniques and skills needed for reducing stress and depression.   Initial Review & Psychosocial Screening: Initial Psych Review & Screening - 11/30/17 1117      Initial Review   Current issues with  Current Stress Concerns    Source of Stress Concerns  Chronic Illness    Comments  He states his COPD is is the main thing that stresses him out.      Family Dynamics   Good Support System?  Yes    Comments  He can look to his wife and daughter for support      Barriers   Psychosocial barriers to participate in program  There are no identifiable barriers or psychosocial  needs.;The patient should benefit from training in stress management and relaxation.      Screening Interventions   Interventions  Provide feedback about the scores to participant;Encouraged to exercise;Program counselor consult;To provide support and resources with identified psychosocial needs    Expected Outcomes  Short Term goal: Utilizing psychosocial counselor, staff and physician to assist with identification of specific Stressors or current issues interfering with healing process. Setting desired goal for each stressor or current issue identified.;Long Term Goal: Stressors or current issues are controlled or eliminated.;Short Term goal: Identification and review with participant of any Quality of Life or Depression concerns found by scoring the questionnaire.;Long Term goal: The participant improves quality of Life and PHQ9 Scores as seen by post scores and/or verbalization of changes       Quality of Life Scores:  Scores of 19 and below usually indicate a poorer quality of life in these areas.  A difference of  2-3 points is a clinically meaningful difference.  A difference of 2-3 points in the total score of the Quality of Life Index has been associated with significant improvement in overall quality of life, self-image, physical symptoms, and general health in studies assessing change in quality of life.  PHQ-9: Recent  Review Flowsheet Data    Depression screen [facility] 2/9 11/30/2017   Decreased Interest 0   Down, Depressed, Hopeless 0   PHQ - 2 Score 0   Altered sleeping 0   Tired, decreased energy 1   Change in appetite 0   Feeling bad or failure about yourself  1   Trouble concentrating 0   Moving slowly or fidgety/restless 0   Suicidal thoughts 0   PHQ-9 Score 2   Difficult doing work/chores Somewhat difficult     Interpretation of Total Score  Total Score Depression Severity:  1-4 = Minimal depression, 5-9 = Mild depression, 10-14 = Moderate depression, 15-19 = Moderately  severe depression, 20-27 = Severe depression   Psychosocial Evaluation and Intervention: Psychosocial Evaluation - 12/13/17 1108      Psychosocial Evaluation & Interventions   Interventions  Encouraged to exercise with the program and follow exercise prescription;Relaxation education    Comments  Counselor met with Mr. Coone Valdes) today for initial psychosocial evaluation.  He is an 84 year old who has COPD.  Jequan has a strong support system with a spouse of 37 years; a daughter in [place]; 6 Grandchildren and (3) Designer, industrial/product.  Wacey also lives in [facility] [facility] and has lots of supports there as well.  He reports sleeping well with Oxygen at night only and he has a good appetite.  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  His mood is generally positive and he has minimal stress in his life other than his health.  Laden has goals to lose weight and increase his endurance while in this program.  He will continue working out at [employer] after completion of this program.      Expected Outcomes  Short:  Kaymen will meet with the dietician to address his weight loss goals.   Long:  Shana will exercise consistently to increase his endurance.    Continue Psychosocial Services   Follow up required by staff       Psychosocial Re-Evaluation: Psychosocial Re-Evaluation    [place] Name 01/07/18 1038 01/12/18 1041           Psychosocial Re-Evaluation   Current issues with  Current Stress Concerns;Current Sleep Concerns  Current Depression      Comments  His doctor states he needs to drink alot of water and sometimes it keeps him up at night using the bathroom. His attitude is neutral. He is willing to work and get healthier. He does not take any medications for his COPD which he should look at as a positive.  Counselor follow up with Milbert Coulter today reporting noticing some positive benefits from this program with a little more endurance.    He continues to have some sleep problems but gets at least 6 hours although intermittent with restroom visits.  He admits to being somewhat pessimistic and calls it "realistic" looking at his health condition gradually worsen.  Counselor discussed this with him and ways to see his life through a different lens - of what he can do vs. his limitations due to his health.  There is a lot of loss Kardell is experiencing from his health decline and counselor suggested him speaking with someone regularly about this.  He declined the offer and stated he has a strong support system who will help him through this.  staff will continue to follow.      Expected Outcomes  Short: continue to  exercise to decrease stress. Long: Maintain exercise post [name] to keep stress at a minimum  Short:  Continue to focus on what he can do vs his limitations to maintain a more positive approach.   Long:  Continue to exercise for stress and to accomplish his goals.        Interventions  Encouraged to attend Pulmonary Rehabilitation for the exercise  Stress management education      Continue Psychosocial Services   Follow up required by staff  Follow up required by staff         Psychosocial Discharge (Final Psychosocial Re-Evaluation): Psychosocial Re-Evaluation - 01/12/18 1041      Psychosocial Re-Evaluation   Current issues with  Current Depression    Comments  Counselor follow up with Milbert Coulter today reporting noticing some positive benefits from this program with a little more endurance.   He continues to have some sleep problems but gets at least 6 hours although intermittent with restroom visits.  He admits to being somewhat pessimistic and calls it "realistic" looking at his health condition gradually worsen.  Counselor discussed this with him and ways to see his life through a different lens - of what he can do vs. his limitations due to his health.  There is a lot of loss Jahrel is experiencing from his health decline and  counselor suggested him speaking with someone regularly about this.  He declined the offer and stated he has a strong support system who will help him through this.  staff will continue to follow.    Expected Outcomes  Short:  Continue to focus on what he can do vs his limitations to maintain a more positive approach.   Long:  Continue to exercise for stress and to accomplish his goals.      Interventions  Stress management education    Continue Psychosocial Services   Follow up required by staff       Education: Education Goals: Education classes will be provided on a weekly basis, covering required topics. Participant will state understanding/return demonstration of topics presented.  Learning Barriers/Preferences: Learning Barriers/Preferences - 11/30/17 1120      Learning Barriers/Preferences   Learning Barriers  Sight wears glasses    Learning Preferences  None       Education Topics:  Initial Evaluation Education: - Verbal, written and demonstration of respiratory meds, oximetry and breathing techniques. Instruction on use of nebulizers and MDIs and importance of monitoring MDI activations.   Pulmonary Rehab from 01/19/2018 in [facility] Cardiac and Pulmonary Rehab  Date  11/30/17  Educator  [facility]  Instruction Review Code  1- Verbalizes Understanding      General Nutrition Guidelines/Fats and Fiber: -Group instruction provided by verbal, written material, models and posters to present the general guidelines for heart healthy nutrition. Gives an explanation and review of dietary fats and fiber.   Pulmonary Rehab from 01/19/2018 in [facility] Cardiac and Pulmonary Rehab  Date  12/13/17  Educator  CR  Instruction Review Code  1- Verbalizes Understanding      Controlling Sodium/Reading Food Labels: -Group verbal and written material supporting the discussion of sodium use in heart healthy nutrition. Review and explanation with models, verbal and written materials for utilization of the food  label.   Pulmonary Rehab from 01/19/2018 in [facility] Cardiac and Pulmonary Rehab  Date  12/27/17  Educator  CR  Instruction Review Code  1- Verbalizes Understanding      Exercise Physiology & General Exercise Guidelines: - Group verbal  and written instruction with models to review the exercise physiology of the cardiovascular system and associated critical values. Provides general exercise guidelines with specific guidelines to those with heart or lung disease.    Aerobic Exercise & Resistance Training: - Gives group verbal and written instruction on the various components of exercise. Focuses on aerobic and resistive training programs and the benefits of this training and how to safely progress through these programs.   Flexibility, Balance, Mind/Body Relaxation: Provides group verbal/written instruction on the benefits of flexibility and balance training, including mind/body exercise modes such as yoga, pilates and tai chi.  Demonstration and skill practice provided.   Stress and Anxiety: - Provides group verbal and written instruction about the health risks of elevated stress and causes of high stress.  Discuss the correlation between heart/lung disease and anxiety and treatment options. Review healthy ways to manage with stress and anxiety.   Depression: - Provides group verbal and written instruction on the correlation between heart/lung disease and depressed mood, treatment options, and the stigmas associated with seeking treatment.   Exercise & Equipment Safety: - Individual verbal instruction and demonstration of equipment use and safety with use of the equipment.   Pulmonary Rehab from 01/19/2018 in [facility] Cardiac and Pulmonary Rehab  Date  11/30/17  Educator  [facility]  Instruction Review Code  1- Verbalizes Understanding      Infection Prevention: - Provides verbal and written material to individual with discussion of infection control including proper hand washing and proper  equipment cleaning during exercise session.   Pulmonary Rehab from 01/19/2018 in [facility] Cardiac and Pulmonary Rehab  Date  11/30/17  Educator  [facility]  Instruction Review Code  1- Verbalizes Understanding      Falls Prevention: - Provides verbal and written material to individual with discussion of falls prevention and safety.   Pulmonary Rehab from 01/19/2018 in [facility] Cardiac and Pulmonary Rehab  Date  11/30/17  Educator  [facility]  Instruction Review Code  1- Verbalizes Understanding      Diabetes: - Individual verbal and written instruction to review signs/symptoms of diabetes, desired ranges of glucose level fasting, after meals and with exercise. Advice that pre and post exercise glucose checks will be done for 3 sessions at entry of program.   Chronic Lung Diseases: - Group verbal and written instruction to review updates, respiratory medications, advancements in procedures and treatments. Discuss use of supplemental oxygen including available portable oxygen systems, continuous and intermittent flow rates, concentrators, personal use and safety guidelines. Review proper use of inhaler and spacers. Provide informative websites for self-education.    Pulmonary Rehab from 01/19/2018 in [facility] Cardiac and Pulmonary Rehab  Date  01/12/18  Educator  [facility]  Instruction Review Code  1- Verbalizes Understanding      Energy Conservation: - Provide group verbal and written instruction for methods to conserve energy, plan and organize activities. Instruct on pacing techniques, use of adaptive equipment and posture/positioning to relieve shortness of breath.   Triggers and Exacerbations: - Group verbal and written instruction to review types of environmental triggers and ways to prevent exacerbations. Discuss weather changes, air quality and the benefits of nasal washing. Review warning signs and symptoms to help prevent infections. Discuss techniques for effective airway clearance, coughing, and  vibrations.   AED/CPR: - Group verbal and written instruction with the use of models to demonstrate the basic use of the AED with the basic ABC's of resuscitation.   Anatomy and Physiology of the Lungs: - Group verbal  and written instruction with the use of models to provide basic lung anatomy and physiology related to function, structure and complications of lung disease.   Pulmonary Rehab from 01/19/2018 in [facility] Cardiac and Pulmonary Rehab  Date  12/29/17  Educator  [facility]  Instruction Review Code  1- Verbalizes Understanding      Anatomy & Physiology of the Heart: - Group verbal and written instruction and models provide basic cardiac anatomy and physiology, with the coronary electrical and arterial systems. Review of Valvular disease and Heart Failure   Cardiac Medications: - Group verbal and written instruction to review commonly prescribed medications for heart disease. Reviews the medication, class of the drug, and side effects.   Know Your Numbers and Risk Factors: -Group verbal and written instruction about important numbers in your health.  Discussion of what are risk factors and how they play a role in the disease process.  Review of Cholesterol, Blood Pressure, Diabetes, and BMI and the role they play in your overall health.   Sleep Hygiene: -Provides group verbal and written instruction about how sleep can affect your health.  Define sleep hygiene, discuss sleep cycles and impact of sleep habits. Review good sleep hygiene tips.    Pulmonary Rehab from 01/19/2018 in [facility] Cardiac and Pulmonary Rehab  Date  01/19/18  Educator  [facility]  Instruction Review Code  1- Verbalizes Understanding      Other: -Provides group and verbal instruction on various topics (see comments)    Knowledge Questionnaire Score: Knowledge Questionnaire Score - 11/30/17 1109      Knowledge Questionnaire Score   Pre Score  14/18 reviewed with patient        Core Components/Risk Factors/Patient  Goals at Admission: Personal Goals and Risk Factors at Admission - 11/30/17 1122      Core Components/Risk Factors/Patient Goals on Admission    Weight Management  Yes;Weight Loss;Weight Maintenance    Intervention  Weight Management: Develop a combined nutrition and exercise program designed to reach desired caloric intake, while maintaining appropriate intake of nutrient and fiber, sodium and fats, and appropriate energy expenditure required for the weight goal.;Weight Management/Obesity: Establish reasonable short term and long term weight goals.;Weight Management: Provide education and appropriate resources to help participant work on and attain dietary goals.    Admit Weight  161 lb 11.2 oz (73.3 kg)    Goal Weight: Short Term  175 lb (79.4 kg)    Goal Weight: Long Term  170 lb (77.1 kg)    Expected Outcomes  Short Term: Continue to assess and modify interventions until short term weight is achieved;Long Term: Adherence to nutrition and physical activity/exercise program aimed toward attainment of established weight goal;Weight Maintenance: Understanding of the daily nutrition guidelines, which includes 25-35% calories from fat, 7% or less cal from saturated fats, less than '200mg'$  cholesterol, less than 1.5gm of sodium, & 5 or more servings of fruits and vegetables daily;Weight Loss: Understanding of general recommendations for a balanced deficit meal plan, which promotes 1-2 lb weight loss per week and includes a negative energy balance of [phone] kcal/d;Understanding recommendations for meals to include 15-35% energy as protein, 25-35% energy from fat, 35-60% energy from carbohydrates, less than '200mg'$  of dietary cholesterol, 20-35 gm of total fiber daily;Understanding of distribution of calorie intake throughout the day with the consumption of 4-5 meals/snacks    Improve shortness of breath with ADL's  Yes    Intervention  Provide education, individualized exercise plan and daily activity  instruction to help decrease  symptoms of SOB with activities of daily living.    Expected Outcomes  Short Term: Improve cardiorespiratory fitness to achieve a reduction of symptoms when performing ADLs;Long Term: Be able to perform more ADLs without symptoms or delay the onset of symptoms    Heart Failure  Yes has angina    Intervention  Provide a combined exercise and nutrition program that is supplemented with education, support and counseling about heart failure. Directed toward relieving symptoms such as shortness of breath, decreased exercise tolerance, and extremity edema.    Expected Outcomes  Improve functional capacity of life;Short term: Attendance in program 2-3 days a week with increased exercise capacity. Reported lower sodium intake. Reported increased fruit and vegetable intake. Reports medication compliance.;Short term: Daily weights obtained and reported for increase. Utilizing diuretic protocols set by physician.;Long term: Adoption of self-care skills and reduction of barriers for early signs and symptoms recognition and intervention leading to self-care maintenance.    Hypertension  Yes    Intervention  Provide education on lifestyle modifcations including regular physical activity/exercise, weight management, moderate sodium restriction and increased consumption of fresh fruit, vegetables, and low fat dairy, alcohol moderation, and smoking cessation.;Monitor prescription use compliance.    Expected Outcomes  Short Term: Continued assessment and intervention until BP is < 140/79m HG in hypertensive participants. < 130/842mHG in hypertensive participants with diabetes, heart failure or chronic kidney disease.;Long Term: Maintenance of blood pressure at goal levels.    Lipids  Yes    Intervention  Provide education and support for participant on nutrition & aerobic/resistive exercise along with prescribed medications to achieve LDL '70mg'$ , HDL >'40mg'$ .    Expected Outcomes  Short Term:  Participant states understanding of desired cholesterol values and is compliant with medications prescribed. Participant is following exercise prescription and nutrition guidelines.;Long Term: Cholesterol controlled with medications as prescribed, with individualized exercise RX and with personalized nutrition plan. Value goals: LDL < '70mg'$ , HDL > 40 mg.       Core Components/Risk Factors/Patient Goals Review:  Goals and Risk Factor Review    Row Name 12/13/17 1031 01/07/18 1023           Core Components/Risk Factors/Patient Goals Review   Personal Goals Review  Weight Management/Obesity;Improve shortness of breath with ADL's;Heart Failure;Lipids;Develop more efficient breathing techniques such as purse lipped breathing and diaphragmatic breathing and practicing self-pacing with activity.;Hypertension  Weight Management/Obesity;Improve shortness of breath with ADL's;Heart Failure;Lipids;Hypertension      Review  Pulminologist recommended [place] 10lbs to help his breathing and he has already lost 4. He weighs himself almost every day and is conisitand with taking all meds. His BP and lipids are repored to be under control. We uses PBL and hopes this program will help improve his SOB.   [place] pulmonologist wants him to be 150 pounds to help with his breathing and be at a healthy weight. He wants to lose about 5 more pounds. His blood pressure has been good but he says when he wakes up in the morning his blood pressure is low and is configuring his medications. His lipid ratio was around 200. His HDL was a little high. He takes Reggies rice for his lipids. His ADLS are good at home except for walking up stairs and incline. He feels like he is improving.       Expected Outcomes  Short: goal weight of 150 lbs. Long: Impove MET level and SOB.   Short: lose 3-5 more pounds, increase incline on the treadmill to  help with ADL's. Long: meet weight goal and focuse on blood pressure next.         Core  Components/Risk Factors/Patient Goals at Discharge (Final Review):  Goals and Risk Factor Review - 01/07/18 1023      Core Components/Risk Factors/Patient Goals Review   Personal Goals Review  Weight Management/Obesity;Improve shortness of breath with ADL's;Heart Failure;Lipids;Hypertension    Review  [place] pulmonologist wants him to be 150 pounds to help with his breathing and be at a healthy weight. He wants to lose about 5 more pounds. His blood pressure has been good but he says when he wakes up in the morning his blood pressure is low and is configuring his medications. His lipid ratio was around 200. His HDL was a little high. He takes Reggies rice for his lipids. His ADLS are good at home except for walking up stairs and incline. He feels like he is improving.     Expected Outcomes  Short: lose 3-5 more pounds, increase incline on the treadmill to help with ADL's. Long: meet weight goal and focuse on blood pressure next.       ITP Comments: ITP Comments    Row Name 11/30/17 1041 12/17/17 1035 12/27/17 0823 01/24/18 0949     ITP Comments  Medical Evaluation completed. Chart sent for review and changes to Dr. Emily Filbert Director of [place]. Diagnosis can be found in [facility] encounter 11/23/16  Shigeo called to let us know he had an accident.  Upon returning his call and reviewing his chart, he fell waking the dog at home and cut both hands and broke a bone in his face.  He is awaiting outpt f/u with maxiofacial specialist for possible surgery.  He is going to be out for at least two weeks and will let us know what the surgeon decides.    30 day review completed. ITP sent to Dr. Emily Filbert Director of [place]. Continue with ITP unless changes are made by physician   30 day review completed. ITP sent to Dr. Emily Filbert Director of [place]. Continue with ITP unless changes are made by physician       Comments: 30 day review

## 2018-01-24 NOTE — Progress Notes (Signed)
Daily Session Note  Patient Details  Name: Caleb Khan MRN: [id number] Date of Birth: 08-Aug-1927 Referring Provider:     Pulmonary Rehab from 11/30/2017 in [facility] Cardiac and Pulmonary Rehab  Referring Provider  [name]      Encounter Date: 01/24/2018  Check In: Session Check In - 01/24/18 1047      Check-In   Location  [facility]-Cardiac & Pulmonary Rehab    Staff Present  Justin Mend RCP,RRT,BSRT;Laureen Owens Shark, BS, RRT, Respiratory Therapist;Mandi Zachery Conch, BS, [facility]    Supervising physician immediately available to respond to emergencies  [name] immediately available ER MD    Physician(s)  Dr. Corky Downs and Jimmye Norman    Medication changes reported      No    Fall or balance concerns reported     No    Warm-up and Cool-down  Performed as group-led instruction    Resistance Training Performed  Yes    VAD Patient?  No      Pain Assessment   Currently in Pain?  No/denies          Social History   Tobacco Use  Smoking Status Former Smoker  . Packs/day: 2.00  . Years: 22.00  . Pack years: 44.00  . Types: Cigarettes  . Last attempt to quit: 05/22/1972  . Years since quitting: 45.7  Smokeless Tobacco Never Used    Goals Met:  Independence with exercise equipment Exercise tolerated well No report of cardiac concerns or symptoms Strength training completed today  Goals Unmet:  Not Applicable  Comments: Pt able to follow exercise prescription today without complaint.  Will continue to monitor for progression.   Dr. Emily Filbert is Medical Director for [place] and [name].

## 2018-01-26 DIAGNOSIS — J449 Chronic obstructive pulmonary disease, unspecified: Secondary | ICD-10-CM

## 2018-01-26 NOTE — Progress Notes (Signed)
Daily Session Note  Patient Details  Name: Quinto Tippy MRN: [id number] Date of Birth: Nov 27, 1927 Referring Provider:     Pulmonary Rehab from 11/30/2017 in [facility] Cardiac and Pulmonary Rehab  Referring Provider  [name]      Encounter Date: 01/26/2018  Check In: Session Check In - 01/26/18 1014      Check-In   Location  [facility]Cardiac [facility] Pulmonary Rehab    Staff Present  Justin Mend RCP,RRT,BSRT;Nana Addai, RN BSN;Jessica Luan Pulling, MA, RCEP, CCRP, Exercise Physiologist    Supervising physician immediately available to respond to emergencies  [name] immediately available ER MD    Physician(s)  Dr. Jimmye Norman and Joni Fears    Medication changes reported      No    Fall or balance concerns reported     No    Warm-up and Cool-down  Performed as group-led instruction    Resistance Training Performed  No LATE, did warm up    VAD Patient?  No      Pain Assessment   Currently in Pain?  No/denies          Social History   Tobacco Use  Smoking Status Former Smoker  . Packs/day: 2.00  . Years: 22.00  . Pack years: 44.00  . Types: Cigarettes  . Last attempt to quit: 05/22/1972  . Years since quitting: 45.7  Smokeless Tobacco Never Used    Goals Met:  Independence with exercise equipment Exercise tolerated well No report of cardiac concerns or symptoms Strength training completed today  Goals Unmet:  Not Applicable  Comments: Pt able to follow exercise prescription today without complaint.  Will continue to monitor for progression.   Dr. Emily Filbert is Medical Director for [place] and [name].

## 2018-01-28 DIAGNOSIS — J449 Chronic obstructive pulmonary disease, unspecified: Secondary | ICD-10-CM

## 2018-01-28 NOTE — Progress Notes (Signed)
Daily Session Note  Patient Details  Name: Caleb Khan MRN: [id number] Date of Birth: 04-21-1928 Referring Provider:     Pulmonary Rehab from 11/30/2017 in [facility] Cardiac and Pulmonary Rehab  Referring Provider  [name]      Encounter Date: 01/28/2018  Check In: Session Check In - 01/28/18 1001      Check-In   Location  [facility]Cardiac & Pulmonary Rehab    Staff Present  Justin Mend Lorre Nick, [place], RCEP, CCRP, Exercise Physiologist;Meredith Sherryll Burger, RN BSN    Supervising physician immediately available to respond to emergencies  [name] immediately available ER MD    Physician(s)  Dr. Alfred Levins and [facility]    Medication changes reported      No    Fall or balance concerns reported     No    Warm-up and Cool-down  Performed as group-led instruction    Resistance Training Performed  Yes    VAD Patient?  No      Pain Assessment   Currently in Pain?  No/denies          Social History   Tobacco Use  Smoking Status Former Smoker  . Packs/day: 2.00  . Years: 22.00  . Pack years: 44.00  . Types: Cigarettes  . Last attempt to quit: 05/22/1972  . Years since quitting: 45.7  Smokeless Tobacco Never Used    Goals Met:  Independence with exercise equipment Exercise tolerated well No report of cardiac concerns or symptoms Strength training completed today  Goals Unmet:  Not Applicable  Comments: Pt able to follow exercise prescription today without complaint.  Will continue to monitor for progression.   Dr. Emily Filbert is Medical Director for [place] and [name].

## 2018-01-31 DIAGNOSIS — J449 Chronic obstructive pulmonary disease, unspecified: Secondary | ICD-10-CM | POA: Diagnosis not present

## 2018-01-31 NOTE — Progress Notes (Signed)
Daily Session Note  Patient Details  Name: Caleb Khan MRN: [id number] Date of Birth: January 25, 1928 Referring Provider:     Pulmonary Rehab from 11/30/2017 in [facility] Cardiac and Pulmonary Rehab  Referring Provider  [name]      Encounter Date: 01/31/2018  Check In: Session Check In - 01/31/18 0957      Check-In   Location  [facility]Cardiac [facility] Pulmonary Rehab    Staff Present  Justin Mend RCP,RRT,BSRT;Mandi Economy, BS, Dennis Bast, BS, ACSM CEP, Exercise Physiologist    Supervising physician immediately available to respond to emergencies  [name] immediately available ER MD    Physician(s)  Dr. Cinda Quest and Corky Downs    Medication changes reported      No    Fall or balance concerns reported     No    Tobacco Cessation  No Change    Warm-up and Cool-down  Performed as group-led instruction    Resistance Training Performed  Yes    VAD Patient?  No      Pain Assessment   Currently in Pain?  No/denies          Social History   Tobacco Use  Smoking Status Former Smoker  . Packs/day: 2.00  . Years: 22.00  . Pack years: 44.00  . Types: Cigarettes  . Last attempt to quit: 05/22/1972  . Years since quitting: 45.7  Smokeless Tobacco Never Used    Goals Met:  Independence with exercise equipment Exercise tolerated well No report of cardiac concerns or symptoms Strength training completed today  Goals Unmet:  Not Applicable  Comments: Pt able to follow exercise prescription today without complaint.  Will continue to monitor for progression.   Dr. Emily Filbert is Medical Director for [place] and [name].

## 2018-02-02 DIAGNOSIS — J449 Chronic obstructive pulmonary disease, unspecified: Secondary | ICD-10-CM

## 2018-02-02 NOTE — Progress Notes (Signed)
Daily Session Note  Patient Details  Name: Caleb Khan MRN: [id number] Date of Birth: August 27, 1927 Referring Provider:     Pulmonary Rehab from 11/30/2017 in [facility] Cardiac and Pulmonary Rehab  Referring Provider  [name]      Encounter Date: 02/02/2018  Check In: Session Check In - 02/02/18 1011      Check-In   Location  [facility]Cardiac [facility] Re[facility]    Staff Present  Justin Mend RCP,RRT,BSRT;Nana Addai, RN Vickki Hearing, BA, ACSM CEP, Exercise Physiologist    Supervising physician immediately available to respond to emergencies  [name] immediately available ER MD    Physician(s)  Dr. Joni Fears and Archie Balboa    Medication changes reported      No    Fall or balance concerns reported     No    Warm-up and Cool-down  Performed as group-led instruction    Resistance Training Performed  Yes    VAD Patient?  No      Pain Assessment   Currently in Pain?  No/denies          Social History   Tobacco Use  Smoking Status Former Smoker  . Packs/day: 2.00  . Years: 22.00  . Pack years: 44.00  . Types: Cigarettes  . Last attempt to quit: 05/22/1972  . Years since quitting: 45.7  Smokeless Tobacco Never Used    Goals Met:  Independence with exercise equipment Exercise tolerated well No report of cardiac concerns or symptoms Strength training completed today  Goals Unmet:  Not Applicable  Comments: Pt able to follow exercise prescription today without complaint.  Will continue to monitor for progression.   Dr. Emily Filbert is Medical Director for [place] and [name].

## 2018-02-04 DIAGNOSIS — J449 Chronic obstructive pulmonary disease, unspecified: Secondary | ICD-10-CM

## 2018-02-04 NOTE — Progress Notes (Signed)
Daily Session Note  Patient Details  Name: Caleb Khan MRN: [id number] Date of Birth: Jan 11, 1928 Referring Provider:     Pulmonary Rehab from 11/30/2017 in [facility] Cardiac and Pulmonary Rehab  Referring Provider  [name]      Encounter Date: 02/04/2018  Check In: Session Check In - 02/04/18 1019      Check-In   Location  [facility] Pul[facility] Re[facility]    Staff Present  Justin Mend RCP,RRT,BSRT;Amanda Oletta Darter, BA, ACSM CEP, Exercise Physiologist;Meredith Sherryll Burger, RN BSN    Supervising physician immediately available to respond to emergencies  [name] immediately available ER MD    Physician(s)  Dr. Joni Fears and Corky Downs    Medication changes reported      No    Fall or balance concerns reported     No    Warm-up and Cool-down  Performed as group-led instruction    Resistance Training Performed  Yes    VAD Patient?  No      Pain Assessment   Currently in Pain?  No/denies          Social History   Tobacco Use  Smoking Status Former Smoker  . Packs/day: 2.00  . Years: 22.00  . Pack years: 44.00  . Types: Cigarettes  . Last attempt to quit: 05/22/1972  . Years since quitting: 45.7  Smokeless Tobacco Never Used    Goals Met:  Independence with exercise equipment Exercise tolerated well No report of cardiac concerns or symptoms Strength training completed today  Goals Unmet:  Not Applicable  Comments: Pt able to follow exercise prescription today without complaint.  Will continue to monitor for progression.   Dr. Emily Filbert is Medical Director for [place] and [name].

## 2018-02-07 DIAGNOSIS — J449 Chronic obstructive pulmonary disease, unspecified: Secondary | ICD-10-CM

## 2018-02-07 NOTE — Progress Notes (Signed)
Daily Session Note  Patient Details  Name: Caleb Khan MRN: [id number] Date of Birth: 11-26-1927 Referring Provider:     Pulmonary Rehab from 11/30/2017 in [facility] Cardiac and Pulmonary Rehab  Referring Provider  [name]      Encounter Date: 02/07/2018  Check In: Session Check In - 02/07/18 1018      Check-In   Location  [facility]Cardiac [facility] Pulmonary Re[facility]    Staff Present  Justin Mend RCP,RRT,BSRT;Amanda Oletta Darter, BA, ACSM CEP, Exercise Physiologist;Kelly Amedeo Plenty, BS, ACSM CEP, Exercise Physiologist    Supervising physician immediately available to respond to emergencies  [name] immediately available ER MD    Physician(s)  Dr. Jimmye Norman and Corky Downs    Medication changes reported      No    Fall or balance concerns reported     No    Warm-up and Cool-down  Performed as group-led instruction    Resistance Training Performed  Yes    VAD Patient?  No      Pain Assessment   Currently in Pain?  No/denies          Social History   Tobacco Use  Smoking Status Former Smoker  . Packs/day: 2.00  . Years: 22.00  . Pack years: 44.00  . Types: Cigarettes  . Last attempt to quit: 05/22/1972  . Years since quitting: 45.7  Smokeless Tobacco Never Used    Goals Met:  Independence with exercise equipment Exercise tolerated well No report of cardiac concerns or symptoms Strength training completed today  Goals Unmet:  Not Applicable  Comments: Pt able to follow exercise prescription today without complaint.  Will continue to monitor for progression.   Dr. Emily Filbert is Medical Director for [place] and [name].

## 2018-02-09 DIAGNOSIS — J449 Chronic obstructive pulmonary disease, unspecified: Secondary | ICD-10-CM | POA: Diagnosis not present

## 2018-02-09 NOTE — Progress Notes (Signed)
Daily Session Note  Patient Details  Name: Caleb Khan MRN: [id number] Date of Birth: 05/23/1928 Referring Provider:     Pulmonary Rehab from 11/30/2017 in [facility] Cardiac and Pulmonary Rehab  Referring Provider  [name]      Encounter Date: 02/09/2018  Check In: Session Check In - 02/09/18 1050      Check-In   Location  [facility]Cardiac [facility] Pulmonary Re[facility]    Staff Present  Justin Mend Lorre Nick, MA, RCEP, CCRP, Exercise Physiologist;Magdeline Prange Oletta Darter, [place], ACSM CEP, Exercise Physiologist    Supervising physician immediately available to respond to emergencies  [name] immediately available ER MD    Physician(s)  Jacqualine Code and Jimmye Norman    Medication changes reported      No    Fall or balance concerns reported     No    Warm-up and Cool-down  Performed as group-led instruction    Resistance Training Performed  Yes    VAD Patient?  No    PAD/SET Patient?  No      Pain Assessment   Currently in Pain?  No/denies    Multiple Pain Sites  No          Social History   Tobacco Use  Smoking Status Former Smoker  . Packs/day: 2.00  . Years: 22.00  . Pack years: 44.00  . Types: Cigarettes  . Last attempt to quit: 05/22/1972  . Years since quitting: 45.7  Smokeless Tobacco Never Used    Goals Met:  Proper associated with RPD/PD & O2 Sat Independence with exercise equipment Exercise tolerated well Strength training completed today  Goals Unmet:  Not Applicable  Comments: Pt able to follow exercise prescription today without complaint.  Will continue to monitor for progression.    Dr. Emily Filbert is Medical Director for [place] and [name].

## 2018-02-11 DIAGNOSIS — J449 Chronic obstructive pulmonary disease, unspecified: Secondary | ICD-10-CM

## 2018-02-11 NOTE — Progress Notes (Signed)
Daily Session Note  Patient Details  Name: Caleb Khan MRN: [id number] Date of Birth: Dec 11, 1927 Referring Provider:     Pulmonary Rehab from 11/30/2017 in [facility] Cardiac and Pulmonary Rehab  Referring Provider  [name]      Encounter Date: 02/11/2018  Check In: Session Check In - 02/11/18 1015      Check-In   Location  [facility]Cardiac [facility] Pulmonary Rehab    Staff Present  Justin Mend RCP,RRT,BSRT;Amanda Oletta Darter, [place], ACSM CEP, Exercise Physiologist;Meredith Sherryll Burger, RN BSN    Supervising physician immediately available to respond to emergencies  [name] immediately available ER MD    Physician(s)  Dr. Mariea Clonts and Burlene Arnt    Medication changes reported      No    Fall or balance concerns reported     No    Warm-up and Cool-down  Performed as group-led instruction    Resistance Training Performed  Yes    VAD Patient?  No      Pain Assessment   Currently in Pain?  No/denies          Social History   Tobacco Use  Smoking Status Former Smoker  . Packs/day: 2.00  . Years: 22.00  . Pack years: 44.00  . Types: Cigarettes  . Last attempt to quit: 05/22/1972  . Years since quitting: 45.7  Smokeless Tobacco Never Used    Goals Met:  Independence with exercise equipment Exercise tolerated well No report of cardiac concerns or symptoms Strength training completed today  Goals Unmet:  Not Applicable  Comments: Pt able to follow exercise prescription today without complaint.  Will continue to monitor for progression.   Dr. Emily Filbert is Medical Director for [name].

## 2018-02-14 DIAGNOSIS — J449 Chronic obstructive pulmonary disease, unspecified: Secondary | ICD-10-CM | POA: Diagnosis not present

## 2018-02-14 NOTE — Progress Notes (Signed)
Daily Session Note  Patient Details  Name: Caleb Khan MRN: [id number] Date of Birth: 02/06/1928 Referring Provider:     Pulmonary Rehab from 11/30/2017 in [facility] Cardiac and Pulmonary Rehab  Referring Provider  [name]      Encounter Date: 02/14/2018  Check In: Session Check In - 02/14/18 1032      Check-In   Location  [facility]Cardiac [facility] Pulmonary Re[facility]    Staff Present  Justin Mend Jaci Carrel, BS, ACSM CEP, Exercise Physiologist;Amanda Oletta Darter, [place], ACSM CEP, Exercise Physiologist    Supervising physician immediately available to respond to emergencies  [name] immediately available ER MD    Physician(s)  Dr. Cinda Quest and Dr. Archie Balboa    Medication changes reported      No    Fall or balance concerns reported     No    Tobacco Cessation  No Change    Warm-up and Cool-down  Performed as group-led instruction    Resistance Training Performed  Yes    VAD Patient?  No    PAD/SET Patient?  No      Pain Assessment   Currently in Pain?  No/denies    Multiple Pain Sites  No          Social History   Tobacco Use  Smoking Status Former Smoker  . Packs/day: 2.00  . Years: 22.00  . Pack years: 44.00  . Types: Cigarettes  . Last attempt to quit: 05/22/1972  . Years since quitting: 45.7  Smokeless Tobacco Never Used    Goals Met:  Proper associated with RPD/PD & O2 Sat Independence with exercise equipment Exercise tolerated well No report of cardiac concerns or symptoms Strength training completed today  Goals Unmet:  Not Applicable  Comments: Pt able to follow exercise prescription today without complaint.  Will continue to monitor for progression.    Dr. Emily Filbert is Medical Director for [place] and [name].

## 2018-02-16 ENCOUNTER — Other Ambulatory Visit: Payer: Self-pay | Admitting: Orthopedic Surgery

## 2018-02-16 DIAGNOSIS — M545 Low back pain, unspecified: Secondary | ICD-10-CM

## 2018-02-16 DIAGNOSIS — M4807 Spinal stenosis, lumbosacral region: Secondary | ICD-10-CM

## 2018-02-16 DIAGNOSIS — J449 Chronic obstructive pulmonary disease, unspecified: Secondary | ICD-10-CM

## 2018-02-16 NOTE — Progress Notes (Signed)
Daily Session Note  Patient Details  Name: Caleb Khan MRN: [id number] Date of Birth: 05-18-1928 Referring Provider:     Pulmonary Rehab from 11/30/2017 in [facility] Cardiac and Pulmonary Rehab  Referring Provider  [name]      Encounter Date: 02/16/2018  Check In: Session Check In - 02/16/18 1542      Check-In   Location  [facility]Cardiac [facility] Pulmonary Re[facility]    Staff Present  Justin Mend Lorre Nick, [place], RCEP, CCRP, Exercise Physiologist;Meredith Sherryll Burger, RN BSN    Supervising physician immediately available to respond to emergencies  [name] immediately available ER MD    Physician(s)  Dr. Burlene Arnt and paduchowski    Medication changes reported      No    Fall or balance concerns reported     No    Warm-up and Cool-down  Performed as group-led instruction    Resistance Training Performed  Yes    VAD Patient?  No      Pain Assessment   Currently in Pain?  No/denies          Social History   Tobacco Use  Smoking Status Former Smoker  . Packs/day: 2.00  . Years: 22.00  . Pack years: 44.00  . Types: Cigarettes  . Last attempt to quit: 05/22/1972  . Years since quitting: 45.7  Smokeless Tobacco Never Used    Goals Met:  Independence with exercise equipment Exercise tolerated well No report of cardiac concerns or symptoms Strength training completed today  Goals Unmet:  Not Applicable  Comments: Pt able to follow exercise prescription today without complaint.  Will continue to monitor for progression.   Dr. Emily Filbert is Medical Director for [place] and [name].

## 2018-02-18 ENCOUNTER — Encounter: Payer: Medicare PPO | Admitting: *Deleted

## 2018-02-18 DIAGNOSIS — J449 Chronic obstructive pulmonary disease, unspecified: Secondary | ICD-10-CM | POA: Diagnosis not present

## 2018-02-18 NOTE — Progress Notes (Signed)
Daily Session Note  Patient Details  Name: Michail Boyte MRN: [id number] Date of Birth: 25-Feb-1928 Referring Provider:     Pulmonary Rehab from 11/30/2017 in [facility] Cardiac and Pulmonary Rehab  Referring Provider  [name]      Encounter Date: 02/18/2018  Check In: Session Check In - 02/18/18 1010      Check-In   Supervising physician immediately available to respond to emergencies  [name] immediately available ER MD    Physician(s)  Dr. Corky Downs and [facility]    Location  [facility]Cardiac [facility] Pulmonary Rehab    Staff Present  Justin Mend RCP,RRT,BSRT;Duwane Gewirtz Sherryll Burger, RN Vickki Hearing, BA, ACSM CEP, Exercise Physiologist    Medication changes reported      No    Fall or balance concerns reported     No    Tobacco Cessation  No Change    Warm-up and Cool-down  Performed as group-led instruction    Resistance Training Performed  Yes    VAD Patient?  No    PAD/SET Patient?  No      Pain Assessment   Currently in Pain?  No/denies          Social History   Tobacco Use  Smoking Status Former Smoker  . Packs/day: 2.00  . Years: 22.00  . Pack years: 44.00  . Types: Cigarettes  . Last attempt to quit: 05/22/1972  . Years since quitting: 45.7  Smokeless Tobacco Never Used    Goals Met:  Proper associated with RPD/PD & O2 Sat Independence with exercise equipment Using PLB without cueing & demonstrates good technique Exercise tolerated well No report of cardiac concerns or symptoms Strength training completed today  Goals Unmet:  Not Applicable  Comments: Pt able to follow exercise prescription today without complaint.  Will continue to monitor for progression.    Dr. Emily Filbert is Medical Director for [place] and [name].

## 2018-02-21 ENCOUNTER — Encounter: Payer: Medicare PPO | Admitting: *Deleted

## 2018-02-21 DIAGNOSIS — J449 Chronic obstructive pulmonary disease, unspecified: Secondary | ICD-10-CM

## 2018-02-21 NOTE — Progress Notes (Signed)
Daily Session Note  Patient Details  Name: Caleb Khan MRN: [id number] Date of Birth: 02-14-1928 Referring Provider:     Pulmonary Rehab from 11/30/2017 in [facility] Cardiac and Pulmonary Rehab  Referring Provider  [name]      Encounter Date: 02/21/2018  Check In: Session Check In - 02/21/18 1023      Check-In   Supervising physician immediately available to respond to emergencies  [name] immediately available ER MD    Physician(s)  Dr. Cinda Quest and Dr. Archie Balboa    Location  [facility]Cardiac & Pulmonary Rehab    Staff Present  Justin Mend Jaci Carrel, BS, ACSM CEP, Exercise Physiologist;Amanda Oletta Darter, [place], ACSM CEP, Exercise Physiologist    Medication changes reported      No    Fall or balance concerns reported     No    Tobacco Cessation  No Change    Warm-up and Cool-down  Performed as group-led instruction    Resistance Training Performed  Yes    VAD Patient?  No    PAD/SET Patient?  No      Pain Assessment   Currently in Pain?  No/denies    Multiple Pain Sites  No          Social History   Tobacco Use  Smoking Status Former Smoker  . Packs/day: 2.00  . Years: 22.00  . Pack years: 44.00  . Types: Cigarettes  . Last attempt to quit: 05/22/1972  . Years since quitting: 45.7  Smokeless Tobacco Never Used    Goals Met:  Proper associated with RPD/PD & O2 Sat Independence with exercise equipment Exercise tolerated well No report of cardiac concerns or symptoms Strength training completed today  Goals Unmet:  Not Applicable  Comments: Pt able to follow exercise prescription today without complaint.  Will continue to monitor for progression.    Dr. Emily Filbert is Medical Director for [place] and [name].

## 2018-02-21 NOTE — Progress Notes (Signed)
Pulmonary Individual Treatment Plan  Patient Details  Name: Caleb Khan MRN: [id number] Date of Birth: 23-Oct-1927 Referring Provider:     Pulmonary Rehab from 11/30/2017 in [facility] Cardiac and Pulmonary Rehab  Referring Provider  [name]      Initial Encounter Date:    Pulmonary Rehab from 11/30/2017 in [facility] Cardiac and Pulmonary Rehab  Date  11/30/17      Visit Diagnosis: Chronic obstructive pulmonary disease, unspecified COPD type ([place])  Patient's Home Medications on Admission:  Current Outpatient Medications:  .  amLODipine (NORVASC) 5 MG tablet, Take 5 mg by mouth 2 (two) times daily. , Disp: , Rfl:  .  aspirin EC 81 MG tablet, Take 81 mg by mouth daily., Disp: , Rfl:  .  CALCIUM-VITAMIN D PO, Take 1 tablet by mouth daily., Disp: , Rfl:  .  docusate sodium (COLACE) 100 MG capsule, Take 200 mg by mouth daily as needed for mild constipation., Disp: , Rfl:  .  HYDROcodone-acetaminophen (NORCO) 5-325 MG tablet, Take 1-2 tablets by mouth every 6 (six) hours as needed for moderate pain., Disp: 30 tablet, Rfl: 0 .  losartan (COZAAR) 25 MG tablet, Take 25 mg by mouth daily., Disp: , Rfl:  .  Multiple Vitamins-Minerals (MULTIVITAMIN PO), Take 1 tablet by mouth daily., Disp: , Rfl:  .  nitroGLYCERIN (NITROSTAT) 0.4 MG SL tablet, Place 0.4 mg under the tongue every 5 (five) minutes as needed for chest pain., Disp: , Rfl:  .  predniSONE (DELTASONE) 10 MG tablet, Take 10 mg by mouth daily with breakfast., Disp: , Rfl:  .  Red Yeast Rice Extract (RED YEAST RICE PO), Take 1 capsule by mouth 2 (two) times daily. , Disp: , Rfl:  .  terazosin (HYTRIN) 10 MG capsule, Take 10 mg by mouth at bedtime., Disp: , Rfl:   Past Medical History: Past Medical History:  Diagnosis Date  . Anemia   . Anginal pain ([place])   . Arthritis    osteoarthritis  . CHF (congestive heart failure) ([place])   . Chronic kidney disease   . Coronary artery disease   . Dyspnea    with exertion  . Hypertension     Tobacco  Use: Social History   Tobacco Use  Smoking Status Former Smoker  . Packs/day: 2.00  . Years: 22.00  . Pack years: 44.00  . Types: Cigarettes  . Last attempt to quit: 05/22/1972  . Years since quitting: 45.7  Smokeless Tobacco Never Used    Labs: Recent Review Flowsheet Data    There is no flowsheet data to display.       Pulmonary Assessment Scores: Pulmonary Assessment Scores    Row Name 11/30/17 1106         ADL UCSD   ADL Phase  Entry     SOB Score total  33     Rest  0     Walk  2     Stairs  4     Bath  0     Dress  1     Shop  2       CAT Score   CAT Score  17       mMRC Score   mMRC Score  1        Pulmonary Function Assessment: Pulmonary Function Assessment - 11/30/17 1124      Initial Spirometry Results   FVC%  134 %    FEV1%  152 %    FEV1/FVC Ratio  76.13  Comments  best of 2 good patient effort      Post Bronchodilator Spirometry Results   FVC%  129.98 %    FEV1%  155.04 %    FEV1/FVC Ratio  80.41    Comments  best of 2 good patient effort      Breath   Bilateral Breath Sounds  Clear    Shortness of Breath  Yes;Limiting activity       Exercise Target Goals:    Exercise Program Goal: Individual exercise prescription set using results from initial 6 min walk test and THRR while considering  patient's activity barriers and safety.    Exercise Prescription Goal: Initial exercise prescription builds to 30-45 minutes a day of aerobic activity, 2-3 days per week.  Home exercise guidelines will be given to patient during program as part of exercise prescription that the participant will acknowledge.  Activity Barriers & Risk Stratification:   6 Minute Walk: 6 Minute Walk    Row Name 11/30/17 1229         6 Minute Walk   Distance  1360 feet     Walk Time  6 minutes     # of Rest Breaks  0     MPH  2.58     METS  2.19     RPE  13     Perceived Dyspnea   2     VO2 Peak  7.66     Symptoms  No     Resting HR  64 bpm      Resting BP  120/54     Resting Oxygen Saturation   97 %     Exercise Oxygen Saturation  during 6 min walk  94 %     Max Ex. HR  108 bpm     Max Ex. BP  132/58     2 Minute Post BP  126/58       Interval HR   1 Minute HR  70     2 Minute HR  78     3 Minute HR  77     4 Minute HR  92     5 Minute HR  108     6 Minute HR  103     2 Minute Post HR  80     Interval Heart Rate?  Yes       Interval Oxygen   Interval Oxygen?  Yes     Baseline Oxygen Saturation %  97 %     1 Minute Oxygen Saturation %  95 %     1 Minute Liters of Oxygen  0 L     2 Minute Oxygen Saturation %  94 %     2 Minute Liters of Oxygen  0 L     3 Minute Oxygen Saturation %  94 %     3 Minute Liters of Oxygen  0 L     4 Minute Oxygen Saturation %  94 %     4 Minute Liters of Oxygen  0 L     5 Minute Oxygen Saturation %  94 %     5 Minute Liters of Oxygen  0 L     6 Minute Oxygen Saturation %  95 %     6 Minute Liters of Oxygen  0 L     2 Minute Post Oxygen Saturation %  97 %     2 Minute Post Liters of Oxygen  0 L  Oxygen Initial Assessment: Oxygen Initial Assessment - 11/30/17 1121      Home Oxygen   Home Oxygen Device  Home Concentrator    Sleep Oxygen Prescription  Continuous    Liters per minute  2    Home Exercise Oxygen Prescription  None    Home at Rest Exercise Oxygen Prescription  None    Compliance with Home Oxygen Use  Yes      Initial 6 min Walk   Oxygen Used  None      Program Oxygen Prescription   Program Oxygen Prescription  None      Intervention   Short Term Goals  To learn and understand importance of monitoring SPO2 with pulse oximeter and demonstrate accurate use of the pulse oximeter.;To learn and understand importance of maintaining oxygen saturations>88%;To learn and demonstrate proper pursed lip breathing techniques or other breathing techniques.;To learn and exhibit compliance with exercise, home and travel O2 prescription    Long  Term Goals  Exhibits compliance  with exercise, home and travel O2 prescription;Verbalizes importance of monitoring SPO2 with pulse oximeter and return demonstration;Maintenance of O2 saturations>88%;Exhibits proper breathing techniques, such as pursed lip breathing or other method taught during program session       Oxygen Re-Evaluation: Oxygen Re-Evaluation    Row Name 12/06/17 1043 12/13/17 1102 01/07/18 1029 02/07/18 1630       Program Oxygen Prescription   Program Oxygen Prescription  None  None  None  None      Home Oxygen   Home Oxygen Device  Home Concentrator  Home Concentrator  Home Concentrator  Home Concentrator    Sleep Oxygen Prescription  Continuous  Continuous  Continuous  Continuous    Liters per minute  _0 Home Exercise Oxygen Prescription  None  None  None  None    Home at Rest Exercise Oxygen Prescription  None  None  None  None    Compliance with Home Oxygen Use  Yes  Yes  Yes  Yes      Goals/Expected Outcomes   Short Term Goals  To learn and demonstrate proper pursed lip breathing techniques or other breathing techniques.;To learn and understand importance of monitoring SPO2 with pulse oximeter and demonstrate accurate use of the pulse oximeter.;To learn and understand importance of maintaining oxygen saturations>88%;To learn and exhibit compliance with exercise, home and travel O2 prescription  To learn and exhibit compliance with exercise, home and travel O2 prescription;To learn and understand importance of monitoring SPO2 with pulse oximeter and demonstrate accurate use of the pulse oximeter.;To learn and understand importance of maintaining oxygen saturations>88%;To learn and demonstrate proper pursed lip breathing techniques or other breathing techniques.;To learn and demonstrate proper use of respiratory medications  To learn and exhibit compliance with exercise, home and travel O2 prescription;To learn and understand importance of monitoring SPO2 with pulse oximeter and demonstrate  accurate use of the pulse oximeter.;To learn and understand importance of maintaining oxygen saturations>88%;To learn and demonstrate proper pursed lip breathing techniques or other breathing techniques.  To learn and exhibit compliance with exercise, home and travel O2 prescription;To learn and understand importance of monitoring SPO2 with pulse oximeter and demonstrate accurate use of the pulse oximeter.;To learn and understand importance of maintaining oxygen saturations>88%;To learn and demonstrate proper pursed lip breathing techniques or other breathing techniques.    Long  Term Goals  -  Exhibits compliance with exercise, home and travel O2 prescription;Verbalizes importance of monitoring  SPO2 with pulse oximeter and return demonstration;Maintenance of O2 saturations>88%;Exhibits proper breathing techniques, such as pursed lip breathing or other method taught during program session;Compliance with respiratory medication;Demonstrates proper use of MDI's  Exhibits compliance with exercise, home and travel O2 prescription;Verbalizes importance of monitoring SPO2 with pulse oximeter and return demonstration;Maintenance of O2 saturations>88%;Exhibits proper breathing techniques, such as pursed lip breathing or other method taught during program session  Exhibits compliance with exercise, home and travel O2 prescription;Verbalizes importance of monitoring SPO2 with pulse oximeter and return demonstration;Maintenance of O2 saturations>88%;Exhibits proper breathing techniques, such as pursed lip breathing or other method taught during program session    Comments  Reviewed PLB with patient. He demonstrated understanding of these concepts.   Reviewed PLB with patient. He demonstrated understanding of these concepts and stated he does [place] PLB at home when he gets SOB.   Caleb Khan is doing well with his PLB. He states when he is breathing hard he automatically does it. Incline and stairs are the things that makes him  short of breath. He is wearing is 2 liter nasal cannula for sleep everynight.   Caleb Khan states he does not want to wear his oxygen when he sleeps. Informed him if it is ordered he should wear it for now and talk to his doctor. Informed him that he does not know what his oxygen saturation is at home when he sleeps.    Goals/Expected Outcomes  Short: Patient will use PLB during exercise to control SOB Long: Patient will become independent with PLB to help control SOB with ADL's.   Short: Patient will use PLB during exercise to control SOB Long: Patient will become independent with PLB to help control SOB with ADL's.   Short: increase incline to help shob while walking up stairs. Long: Maintain incline and improve Shortness of breath when going up stairs.  Short: talk to his doctor about sleep oxygen prescription. Long: adhere to sleep oxygen prescription.       Oxygen Discharge (Final Oxygen Re-Evaluation): Oxygen Re-Evaluation - 02/07/18 1630      Program Oxygen Prescription   Program Oxygen Prescription  None      Home Oxygen   Home Oxygen Device  Home Concentrator    Sleep Oxygen Prescription  Continuous    Liters per minute  2    Home Exercise Oxygen Prescription  None    Home at Rest Exercise Oxygen Prescription  None    Compliance with Home Oxygen Use  Yes      Goals/Expected Outcomes   Short Term Goals  To learn and exhibit compliance with exercise, home and travel O2 prescription;To learn and understand importance of monitoring SPO2 with pulse oximeter and demonstrate accurate use of the pulse oximeter.;To learn and understand importance of maintaining oxygen saturations>88%;To learn and demonstrate proper pursed lip breathing techniques or other breathing techniques.    Long  Term Goals  Exhibits compliance with exercise, home and travel O2 prescription;Verbalizes importance of monitoring SPO2 with pulse oximeter and return demonstration;Maintenance of O2 saturations>88%;Exhibits proper  breathing techniques, such as pursed lip breathing or other method taught during program session    Comments  Caleb Khan states he does not want to wear his oxygen when he sleeps. Informed him if it is ordered he should wear it for now and talk to his doctor. Informed him that he does not know what his oxygen saturation is at home when he sleeps.    Goals/Expected Outcomes  Short: talk to his doctor about sleep  oxygen prescription. Long: adhere to sleep oxygen prescription.       Initial Exercise Prescription: Initial Exercise Prescription - 11/30/17 1200      Date of Initial Exercise RX and Referring Provider   Date  11/30/17    Referring Provider  [name]      Treadmill   MPH  2    Grade  0.5    Minutes  15    METs  2.6      Recumbant Bike   Level  2    RPM  60    Watts  10    Minutes  15    METs  2.4      REL-XR   Level  2    Speed  50    Minutes  15    METs  2.4      T5 Nustep   Level  2    SPM  80    Minutes  15    METs  2.4      Prescription Details   Frequency (times per week)  3    Duration  Progress to 45 minutes of aerobic exercise without signs/symptoms of physical distress      Intensity   THRR 40-80% of Max Heartrate  92-119    Ratings of Perceived Exertion  11-15    Perceived Dyspnea  0-4      Resistance Training   Training Prescription  Yes    Weight  4 lb    Reps  10-15       Perform Capillary Blood Glucose checks as needed.  Exercise Prescription Changes: Exercise Prescription Changes    Row Name 12/14/17 1300 12/29/17 1200 01/12/18 1400 01/26/18 1500 02/09/18 1200     Response to Exercise   Blood Pressure (Admit)  112/60  118/54  116/64  112/56  108/64   Blood Pressure (Exercise)  130/70  -  -  -  -   Blood Pressure (Exit)  130/80  104/58  126/62  124/70  112/54   Heart Rate (Admit)  64 bpm  63 bpm  64 bpm  70 bpm  73 bpm   Heart Rate (Exercise)  90 bpm  78 bpm  76 bpm  87 bpm  79 bpm   Heart Rate (Exit)  62 bpm  63 bpm  54 bpm  63 bpm  67  bpm   Oxygen Saturation (Admit)  98 %  -  97 %  96 %  98 %   Oxygen Saturation (Exercise)  97 %  95 %  96 %  96 %  95 %   Oxygen Saturation (Exit)  93 %  98 %  94 %  98 %  98 %   Rating of Perceived Exertion (Exercise)  _0 Perceived Dyspnea (Exercise)  _1 -   Symptoms  none  none  none  none  none   Duration  Continue with 45 min of aerobic exercise without signs/symptoms of physical distress.  Continue with 45 min of aerobic exercise without signs/symptoms of physical distress.  Continue with 45 min of aerobic exercise without signs/symptoms of physical distress.  Continue with 45 min of aerobic exercise without signs/symptoms of physical distress.  Continue with 45 min of aerobic exercise without signs/symptoms of physical distress.   Intensity  THRR New  THRR unchanged  THRR New reaching RPE of 14 - THR R +  30  THRR unchanged  THRR unchanged     Progression   Progression  Continue to progress workloads to maintain intensity without signs/symptoms of physical distress.  Continue to progress workloads to maintain intensity without signs/symptoms of physical distress.  Continue to progress workloads to maintain intensity without signs/symptoms of physical distress.  Continue to progress workloads to maintain intensity without signs/symptoms of physical distress.  Continue to progress workloads to maintain intensity without signs/symptoms of physical distress.   Average METs  2.8  2.85  3.2  3.35  3.8     Resistance Training   Training Prescription  Yes  Yes  Yes  Yes  Yes   Weight  4 lb  4 lb  4 lb  4 lb  4 lb   Reps  10-15  10-15  10-15  10-15  10-15     Interval Training   Interval Training  -  -  -  No  No     Treadmill   MPH  2  -  2.5  2.5  2.5   Grade  3  -  _0 Minutes  15  -  _1 METs  3.3  -  3.95  3.95  3.95     Recumbant Bike   Level  -  5  -  6  6   RPM  -  60  -  64  60   Watts  -  34  -  43  38   Minutes  -  15  -  15  15   METs   -  3.4  -  3.8  3.73     T5 Nustep   Level  _2 -   SPM  80  80  80  89  -   Minutes  _3 -   METs  2.3  2.3  2.4  2.3  -     Home Exercise Plan   Plans to continue exercise at  -  [employer] (comment) twin [place] (comment) twin [employer] (comment) twin [employer]  -  Add 2 additional days to program exercise sessions.  -  Add 2 additional days to program exercise sessions.  Add 2 additional days to program exercise sessions.   Initial Home Exercises Provided  -  12/29/17  -  12/29/17  12/29/17      Exercise Comments: Exercise Comments    Row Name 12/06/17 1041 12/29/17 1223         Exercise Comments   First full day of exercise!  Patient was oriented to gym and equipment including functions, settings, policies, and procedures.  Patient's individual exercise prescription and treatment plan were reviewed.  All starting workloads were established based on the results of the 6 minute walk test done at initial orientation visit.  The plan for exercise progression was also introduced and progression will be customized based on patient's performance and goals.  Reviewed home exercise with pt today.  Pt plans to use gym at [facility] for exercise.  Reviewed THR, pulse, RPE, sign and symptoms, NTG use, and when to call 911 or MD.  Also discussed weather considerations and indoor options.  Pt voiced understanding.         Exercise Goals and Review: Exercise Goals  [place] Name 11/30/17 1228             Exercise Goals   Increase Physical Activity  Yes       Intervention  Provide advice, education, support and counseling about physical activity/exercise needs.;Develop an individualized exercise prescription for aerobic and resistive training based on initial evaluation findings, risk stratification, comorbidities and participant's personal goals.       Expected Outcomes  Short Term: Attend rehab on a regular basis to  increase amount of physical activity.;Long Term: Add in home exercise to make exercise part of routine and to increase amount of physical activity.;Long Term: Exercising regularly at least 3-5 days a week.       Increase Strength and Stamina  Yes       Intervention  Provide advice, education, support and counseling about physical activity/exercise needs.;Develop an individualized exercise prescription for aerobic and resistive training based on initial evaluation findings, risk stratification, comorbidities and participant's personal goals.       Expected Outcomes  Short Term: Increase workloads from initial exercise prescription for resistance, speed, and METs.;Short Term: Perform resistance training exercises routinely during rehab and add in resistance training at home;Long Term: Improve cardiorespiratory fitness, muscular endurance and strength as measured by increased METs and functional capacity (6MWT)       Able to understand and use rate of perceived exertion (RPE) scale  Yes       Intervention  Provide education and explanation on how to use RPE scale       Expected Outcomes  Short Term: Able to use RPE daily in rehab to express subjective intensity level;Long Term:  Able to use RPE to guide intensity level when exercising independently       Able to understand and use Dyspnea scale  Yes       Intervention  Provide education and explanation on how to use Dyspnea scale       Expected Outcomes  Short Term: Able to use Dyspnea scale daily in rehab to express subjective sense of shortness of breath during exertion;Long Term: Able to use Dyspnea scale to guide intensity level when exercising independently       Knowledge and understanding of Target Heart Rate Range (THRR)  Yes       Intervention  Provide education and explanation of THRR including how the numbers were predicted and where they are located for reference       Expected Outcomes  Short Term: Able to state/look up THRR;Short Term: Able to  use daily as guideline for intensity in rehab;Long Term: Able to use THRR to govern intensity when exercising independently       Able to check pulse independently  Yes       Intervention  Provide education and demonstration on how to check pulse in carotid and radial arteries.;Review the importance of being able to check your own pulse for safety during independent exercise       Expected Outcomes  Short Term: Able to explain why pulse checking is important during independent exercise;Long Term: Able to check pulse independently and accurately       Understanding of Exercise Prescription  Yes       Intervention  Provide education, explanation, and written materials on patient's individual exercise prescription       Expected Outcomes  Short Term: Able to explain program exercise prescription;Long Term: Able to explain home exercise prescription to exercise independently          Exercise Goals  Re-Evaluation : Exercise Goals Re-Evaluation    Caleb Khan Name 12/06/17 1041 12/13/17 1050 12/14/17 1323 12/29/17 1223 01/12/18 1449     Exercise Goal Re-Evaluation   Exercise Goals Review  Increase Physical Activity;Increase Strength and Stamina;Able to understand and use rate of perceived exertion (RPE) scale;Knowledge and understanding of Target Heart Rate Range (THRR);Able to understand and use Dyspnea scale;Understanding of Exercise Prescription  Increase Physical Activity;Increase Strength and Stamina;Able to understand and use rate of perceived exertion (RPE) scale;Able to understand and use Dyspnea scale;Knowledge and understanding of Target Heart Rate Range (THRR);Understanding of Exercise Prescription  Increase Physical Activity;Increase Strength and Stamina;Able to understand and use Dyspnea scale;Able to understand and use rate of perceived exertion (RPE) scale  Increase Physical Activity;Increase Strength and Stamina;Able to understand and use rate of perceived exertion (RPE) scale;Able to understand and  use Dyspnea scale;Knowledge and understanding of Target Heart Rate Range (THRR);Able to check pulse independently;Understanding of Exercise Prescription  Increase Physical Activity;Able to understand and use rate of perceived exertion (RPE) scale;Understanding of Exercise Prescription;Increase Strength and Stamina;Able to understand and use Dyspnea scale   Comments  Reviewed RPE scale, THR and program prescription with pt today.  Pt voiced understanding and was given a copy of goals to take home.   Caleb Khan feels like he is getting a good start to the program and has had no complications with his exercise so far. He also walks at home.  Caleb Khan has tolerated exercise well in first sessions.  He has increased incline on TM.  Staff will monitor progress.  Reviewed home exercise with pt today.  Pt plans to use gym at [facility] for exercise.  Reviewed THR, pulse, RPE, sign and symptoms, NTG use, and when to call 911 or MD.  Also discussed weather considerations and indoor options.  Pt voiced understanding.  Caleb Khan has increased overall MET level.  Staff will monitor progress   Expected Outcomes  Short: Use RPE daily to regulate intensity.  Long: Follow program prescription in THR.  Short: attend Pulmonary rehab on a regular basis. Long: Increase met level and improve SOB.   Short - Pt will attend regularly Long - Pt will improve MET level  Short - Pt will exercise on days not at [facility] and monitor HR and 02 - will purchase oximeter for home Long - Pt will maintain exercise gains after completing LW  Short - Abb will continue to attend regularly Long - Sotirios will continue to improve MET level   Row Name 01/26/18 1523 02/09/18 1256           Exercise Goal Re-Evaluation   Exercise Goals Review  Increase Physical Activity;Increase Strength and Stamina;Understanding of Exercise Prescription  Increase Physical Activity;Able to understand and use rate of perceived exertion (RPE) scale;Increase Strength and Stamina      Comments   Caleb Khan continues to do well in rehab.  He is now doing level 6 on the recumbent bike.  We will continue to monitor his pogression.   Caleb Khan is progressing well with exercise and increasing overall MET level      Expected Outcomes  Short: Move up workload on treadmill.  Long: Continue to exercise on off days.   Short - continue to attend consistently Long - increase METs from current level         Discharge Exercise Prescription (Final Exercise Prescription Changes): Exercise Prescription Changes - 02/09/18 1200      Response to Exercise   Blood Pressure (Admit)  108/64  Blood Pressure (Exit)  112/54    Heart Rate (Admit)  73 bpm    Heart Rate (Exercise)  79 bpm    Heart Rate (Exit)  67 bpm    Oxygen Saturation (Admit)  98 %    Oxygen Saturation (Exercise)  95 %    Oxygen Saturation (Exit)  98 %    Rating of Perceived Exertion (Exercise)  15    Symptoms  none    Duration  Continue with 45 min of aerobic exercise without signs/symptoms of physical distress.    Intensity  THRR unchanged      Progression   Progression  Continue to progress workloads to maintain intensity without signs/symptoms of physical distress.    Average METs  3.8      Resistance Training   Training Prescription  Yes    Weight  4 lb    Reps  10-15      Interval Training   Interval Training  No      Treadmill   MPH  2.5    Grade  3    Minutes  15    METs  3.95      Recumbant Bike   Level  6    RPM  60    Watts  38    Minutes  15    METs  3.73      Home Exercise Plan   Plans to continue exercise at  [employer] (comment) [place]    Frequency  Add 2 additional days to program exercise sessions.    Initial Home Exercises Provided  12/29/17       Nutrition:  Target Goals: Understanding of nutrition guidelines, daily intake of sodium <1555m, cholesterol <2041m calories 30% from fat and 7% or less from saturated fats, daily to have 5 or more servings of fruits and  vegetables.  Biometrics: Pre Biometrics - 11/30/17 1228      Pre Biometrics   Height  5' 5.5" (1.664 m)    Weight  161 lb 11.2 oz (73.3 kg)    Waist Circumference  36.5 inches    Hip Circumference  38.5 inches    Waist to Hip Ratio  0.95 %    BMI (Calculated)  26.49        Nutrition Therapy Plan and Nutrition Goals: Nutrition Therapy & Goals - 11/30/17 1119      Personal Nutrition Goals   Comments  Lose a little weight, Learn how to eat healthier      Intervention Plan   Intervention  Prescribe, educate and counsel regarding individualized specific dietary modifications aiming towards targeted core components such as weight, hypertension, lipid management, diabetes, heart failure and other comorbidities.;Nutrition handout(s) given to patient.    Expected Outcomes  Short Term Goal: Understand basic principles of dietary content, such as calories, fat, sodium, cholesterol and nutrients.;Long Term Goal: Adherence to prescribed nutrition plan.       Nutrition Assessments: Nutrition Assessments - 11/30/17 1106      MEDFICTS Scores   Pre Score  49       Nutrition Goals Re-Evaluation: Nutrition Goals Re-Evaluation    Row Name 12/13/17 1100 01/07/18 1034 02/07/18 1633         Goals   Current Weight  -  157 lb (71.2 kg)  156 lb (70.8 kg)     Nutrition Goal  -  He wants to be 150 pounds.   He wants to be 150 pounds.      Comment  Set Cristofer up for a dietician appointment on 12/22/17  He has cut out desserts and it has been helping him lose weight. His wife cooks healthy and they both watch what they eat.  Caleb Khan states that he has not new goals for nutrition. He wants to lose a few more pounds still before the end of the program.     Expected Outcome  Short: Meet with dietician Long: follow nutician goals set during dietician meeting.   Short: lose 3-5 pounds more. Long: Maintain weight loss  Short: lose 3-5 pounds more. Long: Maintain weight loss        Nutrition Goals Discharge  (Final Nutrition Goals Re-Evaluation): Nutrition Goals Re-Evaluation - 02/07/18 1633      Goals   Current Weight  156 lb (70.8 kg)    Nutrition Goal  He wants to be 150 pounds.     Comment  Caleb Khan states that he has not new goals for nutrition. He wants to lose a few more pounds still before the end of the program.    Expected Outcome  Short: lose 3-5 pounds more. Long: Maintain weight loss       Psychosocial: Target Goals: Acknowledge presence or absence of significant depression and/or stress, maximize coping skills, provide positive support system. Participant is able to verbalize types and ability to use techniques and skills needed for reducing stress and depression.   Initial Review & Psychosocial Screening: Initial Psych Review & Screening - 11/30/17 1117      Initial Review   Current issues with  Current Stress Concerns    Source of Stress Concerns  Chronic Illness    Comments  He states his COPD is is the main thing that stresses him out.      Family Dynamics   Good Support System?  Yes    Comments  He can look to his wife and daughter for support      Barriers   Psychosocial barriers to participate in program  There are no identifiable barriers or psychosocial needs.;The patient should benefit from training in stress management and relaxation.      Screening Interventions   Interventions  Provide feedback about the scores to participant;Encouraged to exercise;Program counselor consult;To provide support and resources with identified psychosocial needs    Expected Outcomes  Short Term goal: Utilizing psychosocial counselor, staff and physician to assist with identification of specific Stressors or current issues interfering with healing process. Setting desired goal for each stressor or current issue identified.;Long Term Goal: Stressors or current issues are controlled or eliminated.;Short Term goal: Identification and review with participant of any Quality of Life or  Depression concerns found by scoring the questionnaire.;Long Term goal: The participant improves quality of Life and PHQ9 Scores as seen by post scores and/or verbalization of changes       Quality of Life Scores:  Scores of 19 and below usually indicate a poorer quality of life in these areas.  A difference of  2-3 points is a clinically meaningful difference.  A difference of 2-3 points in the total score of the Quality of Life Index has been associated with significant improvement in overall quality of life, self-image, physical symptoms, and general health in studies assessing change in quality of life.  PHQ-9: Recent Review Flowsheet Data    Depression screen [facility] 2/9 11/30/2017   Decreased Interest 0   Down, Depressed, Hopeless 0   PHQ - 2 Score 0   Altered sleeping 0   Tired, decreased energy  1   Change in appetite 0   Feeling bad or failure about yourself  1   Trouble concentrating 0   Moving slowly or fidgety/restless 0   Suicidal thoughts 0   PHQ-9 Score 2   Difficult doing work/chores Somewhat difficult     Interpretation of Total Score  Total Score Depression Severity:  1-4 = Minimal depression, 5-9 = Mild depression, 10-14 = Moderate depression, 15-19 = Moderately severe depression, 20-27 = Severe depression   Psychosocial Evaluation and Intervention: Psychosocial Evaluation - 12/13/17 1108      Psychosocial Evaluation & Interventions   Interventions  Encouraged to exercise with the program and follow exercise prescription;Relaxation education    Comments  Counselor met with Mr. Lafond Vineyard) today for initial psychosocial evaluation.  He is an 84 year old who has COPD.  Caleb Khan has a strong support system with a spouse of 32 years; a daughter in [place]; 6 Grandchildren and (3) Designer, industrial/product.  Nathaneal also lives in [place] residential community and has lots of supports there as well.  He reports sleeping well with Oxygen at night only and he has a good appetite.   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  His mood is generally positive and he has minimal stress in his life other than his health.  Nakia has goals to lose weight and increase his endurance while in this program.  He will continue working out at [employer] after completion of this program.      Expected Outcomes  Short:  Caleb Khan will meet with the dietician to address his weight loss goals.   Long:  [place] will exercise consistently to increase his endurance.    Continue Psychosocial Services   Follow up required by staff       Psychosocial Re-Evaluation: Psychosocial Re-Evaluation    [place] Name 01/07/18 1038 01/12/18 1041 02/07/18 1633         Psychosocial Re-Evaluation   Current issues with  Current Stress Concerns;Current Sleep Concerns  Current Depression  Current Depression     Comments  His doctor states he needs to drink alot of water and sometimes it keeps him up at night using the bathroom. His attitude is neutral. He is willing to work and get healthier. He does not take any medications for his COPD which he should look at as a positive.  Counselor follow up with Caleb Khan today reporting noticing some positive benefits from this program with a little more endurance.   He continues to have some sleep problems but gets at least 6 hours although intermittent with restroom visits.  He admits to being somewhat pessimistic and calls it "realistic" looking at his health condition gradually worsen.  Counselor discussed this with him and ways to see his life through a different lens - of what he can do vs. his limitations due to his health.  There is a lot of loss Caleb Khan is experiencing from his health decline and counselor suggested him speaking with someone regularly about this.  He declined the offer and stated he has a strong support system who will help him through this.  staff will continue to follow.  Caleb Khan states that there is no new concernes with his  mental health at this time.     Expected Outcomes  Short: continue to exercise to decrease stress. Long: Maintain exercise post [name] to keep stress at a minimum  Short:  Continue to focus on  what he can do vs his limitations to maintain a more positive approach.   Long:  Continue to exercise for stress and to accomplish his goals.    Short: Attend [name] to decrease stress. Long: Maintain exercise Post [name] to keep stress at a minimum.     Interventions  Encouraged to attend Pulmonary Rehabilitation for the exercise  Stress management education  Encouraged to attend Pulmonary Rehabilitation for the exercise     Continue Psychosocial Services   Follow up required by staff  Follow up required by staff  Follow up required by staff        Psychosocial Discharge (Final Psychosocial Re-Evaluation): Psychosocial Re-Evaluation - 02/07/18 1633      Psychosocial Re-Evaluation   Current issues with  Current Depression    Comments  Imri states that there is no new concernes with his mental health at this time.    Expected Outcomes  Short: Attend [name] to decrease stress. Long: Maintain exercise Post [name] to keep stress at a minimum.    Interventions  Encouraged to attend Pulmonary Rehabilitation for the exercise    Continue Psychosocial Services   Follow up required by staff       Education: Education Goals: Education classes will be provided on a weekly basis, covering required topics. Participant will state understanding/return demonstration of topics presented.  Learning Barriers/Preferences: Learning Barriers/Preferences - 11/30/17 1120      Learning Barriers/Preferences   Learning Barriers  Sight wears glasses    Learning Preferences  None       Education Topics:  Initial Evaluation Education: - Verbal, written and demonstration of respiratory meds, oximetry and breathing techniques. Instruction on use of nebulizers and MDIs and importance of monitoring MDI  activations.   Pulmonary Rehab from 02/14/2018 in [facility] Cardiac and Pulmonary Rehab  Date  11/30/17  Educator  [facility]  Instruction Review Code  1- Verbalizes Understanding      General Nutrition Guidelines/Fats and Fiber: -Group instruction provided by verbal, written material, models and posters to present the general guidelines for heart healthy nutrition. Gives an explanation and review of dietary fats and fiber.   Pulmonary Rehab from 02/14/2018 in [facility] Cardiac and Pulmonary Rehab  Date  02/07/18  Educator  CR  Instruction Review Code  5- Refused Teaching      Controlling Sodium/Reading Food Labels: -Group verbal and written material supporting the discussion of sodium use in heart healthy nutrition. Review and explanation with models, verbal and written materials for utilization of the food label.   Pulmonary Rehab from 02/14/2018 in [facility] Cardiac and Pulmonary Rehab  Date  12/27/17  Educator  CR  Instruction Review Code  1- Verbalizes Understanding      Exercise Physiology & General Exercise Guidelines: - Group verbal and written instruction with models to review the exercise physiology of the cardiovascular system and associated critical values. Provides general exercise guidelines with specific guidelines to those with heart or lung disease.    Aerobic Exercise & Resistance Training: - Gives group verbal and written instruction on the various components of exercise. Focuses on aerobic and resistive training programs and the benefits of this training and how to safely progress through these programs.   Pulmonary Rehab from 02/14/2018 in [facility] Cardiac and Pulmonary Rehab  Date  02/09/18  Educator  [facility]  Instruction Review Code  1- Verbalizes Understanding      Flexibility, Balance, Mind/Body Relaxation: Provides group verbal/written instruction on the benefits of flexibility and balance training, including mind/body exercise  modes such as yoga, pilates and tai chi.  Demonstration and  skill practice provided.   Stress and Anxiety: - Provides group verbal and written instruction about the health risks of elevated stress and causes of high stress.  Discuss the correlation between heart/lung disease and anxiety and treatment options. Review healthy ways to manage with stress and anxiety.   Depression: - Provides group verbal and written instruction on the correlation between heart/lung disease and depressed mood, treatment options, and the stigmas associated with seeking treatment.   Pulmonary Rehab from 02/14/2018 in [facility] Cardiac and Pulmonary Rehab  Date  02/02/18  Educator  [facility]  Instruction Review Code  1- Verbalizes Understanding      Exercise & Equipment Safety: - Individual verbal instruction and demonstration of equipment use and safety with use of the equipment.   Pulmonary Rehab from 02/14/2018 in [facility] Cardiac and Pulmonary Rehab  Date  11/30/17  Educator  [facility]  Instruction Review Code  1- Verbalizes Understanding      Infection Prevention: - Provides verbal and written material to individual with discussion of infection control including proper hand washing and proper equipment cleaning during exercise session.   Pulmonary Rehab from 02/14/2018 in [facility] Cardiac and Pulmonary Rehab  Date  11/30/17  Educator  [facility]  Instruction Review Code  1- Verbalizes Understanding      Falls Prevention: - Provides verbal and written material to individual with discussion of falls prevention and safety.   Pulmonary Rehab from 02/14/2018 in [facility] Cardiac and Pulmonary Rehab  Date  11/30/17  Educator  [facility]  Instruction Review Code  1- Verbalizes Understanding      Diabetes: - Individual verbal and written instruction to review signs/symptoms of diabetes, desired ranges of glucose level fasting, after meals and with exercise. Advice that pre and post exercise glucose checks will be done for 3 sessions at entry of program.   Chronic Lung Diseases: - Group verbal and written  instruction to review updates, respiratory medications, advancements in procedures and treatments. Discuss use of supplemental oxygen including available portable oxygen systems, continuous and intermittent flow rates, concentrators, personal use and safety guidelines. Review proper use of inhaler and spacers. Provide informative websites for self-education.    Pulmonary Rehab from 02/14/2018 in [facility] Cardiac and Pulmonary Rehab  Date  01/12/18  Educator  [facility]  Instruction Review Code  1- Verbalizes Understanding      Energy Conservation: - Provide group verbal and written instruction for methods to conserve energy, plan and organize activities. Instruct on pacing techniques, use of adaptive equipment and posture/positioning to relieve shortness of breath.   Triggers and Exacerbations: - Group verbal and written instruction to review types of environmental triggers and ways to prevent exacerbations. Discuss weather changes, air quality and the benefits of nasal washing. Review warning signs and symptoms to help prevent infections. Discuss techniques for effective airway clearance, coughing, and vibrations.   Pulmonary Rehab from 02/14/2018 in [facility] Cardiac and Pulmonary Rehab  Date  01/28/18  Educator  [facility]  Instruction Review Code  1- Verbalizes Understanding      AED/CPR: - Group verbal and written instruction with the use of models to demonstrate the basic use of the AED with the basic ABC's of resuscitation.   Anatomy and Physiology of the Lungs: - Group verbal and written instruction with the use of models to provide basic lung anatomy and physiology related to function, structure and complications of lung disease.   Pulmonary Rehab from 02/14/2018 in [facility] Cardiac  and Pulmonary Rehab  Date  12/29/17  Educator  [facility]  Instruction Review Code  1- Verbalizes Understanding      Anatomy & Physiology of the Heart: - Group verbal and written instruction and models provide basic cardiac anatomy  and physiology, with the coronary electrical and arterial systems. Review of Valvular disease and Heart Failure   Cardiac Medications: - Group verbal and written instruction to review commonly prescribed medications for heart disease. Reviews the medication, class of the drug, and side effects.   Know Your Numbers and Risk Factors: -Group verbal and written instruction about important numbers in your health.  Discussion of what are risk factors and how they play a role in the disease process.  Review of Cholesterol, Blood Pressure, Diabetes, and BMI and the role they play in your overall health.   Pulmonary Rehab from 02/14/2018 in [facility] Cardiac and Pulmonary Rehab  Date  01/26/18  Educator  [facility]  Instruction Review Code  1- Verbalizes Understanding      Sleep Hygiene: -Provides group verbal and written instruction about how sleep can affect your health.  Define sleep hygiene, discuss sleep cycles and impact of sleep habits. Review good sleep hygiene tips.    Pulmonary Rehab from 02/14/2018 in [facility] Cardiac and Pulmonary Rehab  Date  01/19/18  Educator  [facility]  Instruction Review Code  1- Verbalizes Understanding      Other: -Provides group and verbal instruction on various topics (see comments)    Knowledge Questionnaire Score: Knowledge Questionnaire Score - 11/30/17 1109      Knowledge Questionnaire Score   Pre Score  14/18 reviewed with patient        Core Components/Risk Factors/Patient Goals at Admission: Personal Goals and Risk Factors at Admission - 11/30/17 1122      Core Components/Risk Factors/Patient Goals on Admission    Weight Management  Yes;Weight Loss;Weight Maintenance    Intervention  Weight Management: Develop a combined nutrition and exercise program designed to reach desired caloric intake, while maintaining appropriate intake of nutrient and fiber, sodium and fats, and appropriate energy expenditure required for the weight goal.;Weight Management/Obesity:  Establish reasonable short term and long term weight goals.;Weight Management: Provide education and appropriate resources to help participant work on and attain dietary goals.    Admit Weight  161 lb 11.2 oz (73.3 kg)    Goal Weight: Short Term  175 lb (79.4 kg)    Goal Weight: Long Term  170 lb (77.1 kg)    Expected Outcomes  Short Term: Continue to assess and modify interventions until short term weight is achieved;Long Term: Adherence to nutrition and physical activity/exercise program aimed toward attainment of established weight goal;Weight Maintenance: Understanding of the daily nutrition guidelines, which includes 25-35% calories from fat, 7% or less cal from saturated fats, less than 259m cholesterol, less than 1.5gm of sodium, & 5 or more servings of fruits and vegetables daily;Weight Loss: Understanding of general recommendations for a balanced deficit meal plan, which promotes 1-2 lb weight loss per week and includes a negative energy balance of [id number] kcal/d;Understanding recommendations for meals to include 15-35% energy as protein, 25-35% energy from fat, 35-60% energy from carbohydrates, less than 2022mof dietary cholesterol, 20-35 gm of total fiber daily;Understanding of distribution of calorie intake throughout the day with the consumption of 4-5 meals/snacks    Improve shortness of breath with ADL's  Yes    Intervention  Provide education, individualized exercise plan and daily activity instruction to help decrease symptoms of  SOB with activities of daily living.    Expected Outcomes  Short Term: Improve cardiorespiratory fitness to achieve a reduction of symptoms when performing ADLs;Long Term: Be able to perform more ADLs without symptoms or delay the onset of symptoms    Heart Failure  Yes has angina    Intervention  Provide a combined exercise and nutrition program that is supplemented with education, support and counseling about heart failure. Directed toward relieving symptoms  such as shortness of breath, decreased exercise tolerance, and extremity edema.    Expected Outcomes  Improve functional capacity of life;Short term: Attendance in program 2-3 days a week with increased exercise capacity. Reported lower sodium intake. Reported increased fruit and vegetable intake. Reports medication compliance.;Short term: Daily weights obtained and reported for increase. Utilizing diuretic protocols set by physician.;Long term: Adoption of self-care skills and reduction of barriers for early signs and symptoms recognition and intervention leading to self-care maintenance.    Hypertension  Yes    Intervention  Provide education on lifestyle modifcations including regular physical activity/exercise, weight management, moderate sodium restriction and increased consumption of fresh fruit, vegetables, and low fat dairy, alcohol moderation, and smoking cessation.;Monitor prescription use compliance.    Expected Outcomes  Short Term: Continued assessment and intervention until BP is < 140/9m HG in hypertensive participants. < 130/861mHG in hypertensive participants with diabetes, heart failure or chronic kidney disease.;Long Term: Maintenance of blood pressure at goal levels.    Lipids  Yes    Intervention  Provide education and support for participant on nutrition & aerobic/resistive exercise along with prescribed medications to achieve LDL <7041mHDL >26m64m  Expected Outcomes  Short Term: Participant states understanding of desired cholesterol values and is compliant with medications prescribed. Participant is following exercise prescription and nutrition guidelines.;Long Term: Cholesterol controlled with medications as prescribed, with individualized exercise RX and with personalized nutrition plan. Value goals: LDL < 70mg68mL > 40 mg.       Core Components/Risk Factors/Patient Goals Review:  Goals and Risk Factor Review    Row Name 12/13/17 1031 01/07/18 1023 02/07/18 1628          Core Components/Risk Factors/Patient Goals Review   Personal Goals Review  Weight Management/Obesity;Improve shortness of breath with ADL's;Heart Failure;Lipids;Develop more efficient breathing techniques such as purse lipped breathing and diaphragmatic breathing and practicing self-pacing with activity.;Hypertension  Weight Management/Obesity;Improve shortness of breath with ADL's;Heart Failure;Lipids;Hypertension  Weight Management/Obesity;Improve shortness of breath with ADL's;Heart Failure;Lipids;Hypertension     Review  Pulminologist recommended Alando Levander 10lbs to help his breathing and he has already lost 4. He weighs himself almost every day and is conisitand with taking all meds. His BP and lipids are repored to be under control. We uses [facility] and hopes this program will help improve his SOB.   Caleb Khan pulmonologist wants him to be 150 pounds to help with his breathing and be at a healthy weight. He wants to lose about 5 more pounds. His blood pressure has been good but he says when he wakes up in the morning his blood pressure is low and is configuring his medications. His lipid ratio was around 200. His HDL was a little high. He takes Caleb Khan rice for his lipids. His ADLS are good at home except for walking up stairs and incline. He feels like he is improving.   Caleb Khan Saviores that he has seen an improvement with his shortness of breath. He states that his goals are the same as last time except  that he wants to increase his stamina more.      Expected Outcomes  Short: goal weight of 150 lbs. Long: Impove MET level and SOB.   Short: lose 3-5 more pounds, increase incline on the treadmill to help with ADL's. Long: meet weight goal and focuse on blood pressure next.  Short: Attend [name] regularly to improve shortness of breath with ADL's. Long: maintain independence with ADL's         Core Components/Risk Factors/Patient Goals at Discharge (Final Review):  Goals and Risk Factor Review - 02/07/18 1628       Core Components/Risk Factors/Patient Goals Review   Personal Goals Review  Weight Management/Obesity;Improve shortness of breath with ADL's;Heart Failure;Lipids;Hypertension    Review  Caleb Khan states that he has seen an improvement with his shortness of breath. He states that his goals are the same as last time except that he wants to increase his stamina more.     Expected Outcomes  Short: Attend [name] regularly to improve shortness of breath with ADL's. Long: maintain independence with ADL's        ITP Comments: ITP Comments    Row Name 11/30/17 1041 12/17/17 1035 12/27/17 0823 01/24/18 0949 02/21/18 0853   ITP Comments  Medical Evaluation completed. Chart sent for review and changes to Dr. Emily Filbert Director of [place]. Diagnosis can be found in [facility] encounter 11/23/16  Bradly called to let us know he had an accident.  Upon returning his call and reviewing his chart, he fell waking the dog at home and cut both hands and broke a bone in his face.  He is awaiting outpt f/u with maxiofacial specialist for possible surgery.  He is going to be out for at least two weeks and will let us know what the surgeon decides.    30 day review completed. ITP sent to Dr. Emily Filbert Director of [place]. Continue with ITP unless changes are made by physician   30 day review completed. ITP sent to Dr. Emily Filbert Director of [place]. Continue with ITP unless changes are made by physician  30 day review completed. ITP sent to Dr. Emily Filbert Director of [place]. Continue with ITP unless changes are made by physician      Comments: 30 day review

## 2018-02-23 DIAGNOSIS — J449 Chronic obstructive pulmonary disease, unspecified: Secondary | ICD-10-CM

## 2018-02-23 NOTE — Progress Notes (Signed)
Daily Session Note  Patient Details  Name: Caleb Khan MRN: [id number] Date of Birth: May 19, 1928 Referring Provider:     Pulmonary Rehab from 11/30/2017 in [facility] Cardiac and Pulmonary Rehab  Referring Provider  [name]      Encounter Date: 02/23/2018  Check In: Session Check In - 02/23/18 1019      Check-In   Supervising physician immediately available to respond to emergencies  [name] immediately available ER MD    Physician(s)  Cinda Quest and Quentin Cornwall    Location  [facility]-Cardiac & Pulmonary Rehab    Staff Present  Justin Mend Lorre Nick, MA, RCEP, CCRP, Exercise Physiologist;Kamla Skilton Oletta Darter, [place], ACSM CEP, Exercise Physiologist    Medication changes reported      No    Fall or balance concerns reported     No    Warm-up and Cool-down  Performed as group-led instruction    Resistance Training Performed  Yes    VAD Patient?  No    PAD/SET Patient?  No      Pain Assessment   Currently in Pain?  No/denies    Multiple Pain Sites  No          Social History   Tobacco Use  Smoking Status Former Smoker  . Packs/day: 2.00  . Years: 22.00  . Pack years: 44.00  . Types: Cigarettes  . Last attempt to quit: 05/22/1972  . Years since quitting: 45.7  Smokeless Tobacco Never Used    Goals Met:  Proper associated with RPD/PD & O2 Sat Independence with exercise equipment Exercise tolerated well Strength training completed today  Goals Unmet:  Not Applicable  Comments: Pt able to follow exercise prescription today without complaint.  Will continue to monitor for progression.    Dr. Emily Filbert is Medical Director for [place] and [name].

## 2018-02-25 ENCOUNTER — Encounter: Payer: Medicare PPO | Attending: Specialist | Admitting: *Deleted

## 2018-02-25 DIAGNOSIS — J449 Chronic obstructive pulmonary disease, unspecified: Secondary | ICD-10-CM | POA: Diagnosis not present

## 2018-02-25 NOTE — Progress Notes (Signed)
Daily Session Note  Patient Details  Name: Rashaud Ybarbo MRN: [id number] Date of Birth: 10-14-1927 Referring Provider:     Pulmonary Rehab from 11/30/2017 in [facility] Cardiac and Pulmonary Rehab  Referring Provider  [name]      Encounter Date: 02/25/2018  Check In: Session Check In - 02/25/18 1043      Check-In   Supervising physician immediately available to respond to emergencies  [name] immediately available ER MD    Physician(s)  Dr. Cherylann Banas and Jacqualine Code     Location  [facility]-Cardiac & Pulmonary Rehab    Staff Present  Justin Mend RCP,RRT,BSRT;Meredith Sherryll Burger, RN Vickki Hearing, BA, ACSM CEP, Exercise Physiologist    Medication changes reported      No    Fall or balance concerns reported     No    Tobacco Cessation  No Change    Warm-up and Cool-down  Performed as group-led instruction    Resistance Training Performed  Yes    VAD Patient?  No      Pain Assessment   Currently in Pain?  No/denies          Social History   Tobacco Use  Smoking Status Former Smoker  . Packs/day: 2.00  . Years: 22.00  . Pack years: 44.00  . Types: Cigarettes  . Last attempt to quit: 05/22/1972  . Years since quitting: 45.7  Smokeless Tobacco Never Used    Goals Met:  Proper associated with RPD/PD & O2 Sat Independence with exercise equipment Using PLB without cueing & demonstrates good technique Exercise tolerated well No report of cardiac concerns or symptoms Strength training completed today  Goals Unmet:  Not Applicable  Comments: Pt able to follow exercise prescription today without complaint.  Will continue to monitor for progression.    Dr. Emily Filbert is Medical Director for [place] and [name].

## 2018-02-28 ENCOUNTER — Encounter: Payer: Medicare PPO | Admitting: *Deleted

## 2018-02-28 DIAGNOSIS — J449 Chronic obstructive pulmonary disease, unspecified: Secondary | ICD-10-CM | POA: Diagnosis not present

## 2018-02-28 NOTE — Progress Notes (Signed)
Daily Session Note  Patient Details  Name: Caleb Khan MRN: [id number] Date of Birth: 01/26/1928 Referring Provider:     Pulmonary Rehab from 11/30/2017 in [facility] Cardiac and Pulmonary Rehab  Referring Provider  [name]      Encounter Date: 02/28/2018  Check In: Session Check In - 02/28/18 1031      Check-In   Supervising physician immediately available to respond to emergencies  [name] immediately available ER MD    Physician(s)  Dr. Joni Fears and Dr. Corky Downs    Location  [facility]-Cardiac & Pulmonary Rehab    Staff Present  Justin Mend Jaci Carrel, BS, ACSM CEP, Exercise Physiologist;Amanda Oletta Darter, [place], ACSM CEP, Exercise Physiologist    Medication changes reported      No    Fall or balance concerns reported     No    Tobacco Cessation  No Change    Warm-up and Cool-down  Performed as group-led instruction    Resistance Training Performed  Yes    VAD Patient?  No    PAD/SET Patient?  No      Pain Assessment   Currently in Pain?  No/denies    Multiple Pain Sites  No          Social History   Tobacco Use  Smoking Status Former Smoker  . Packs/day: 2.00  . Years: 22.00  . Pack years: 44.00  . Types: Cigarettes  . Last attempt to quit: 05/22/1972  . Years since quitting: 45.8  Smokeless Tobacco Never Used    Goals Met:  Proper associated with RPD/PD & O2 Sat Independence with exercise equipment Exercise tolerated well No report of cardiac concerns or symptoms Strength training completed today  Goals Unmet:  Not Applicable  Comments: Pt able to follow exercise prescription today without complaint.  Will continue to monitor for progression.    Dr. Emily Filbert is Medical Director for [place] and [name].

## 2018-03-02 ENCOUNTER — Ambulatory Visit
Admission: RE | Admit: 2018-03-02 | Discharge: 2018-03-02 | Disposition: A | Discharge status: Home / Self Care | Payer: Medicare PPO | Source: Ambulatory Visit | Attending: Orthopedic Surgery | Admitting: Orthopedic Surgery

## 2018-03-02 DIAGNOSIS — M5136 Other intervertebral disc degeneration, lumbar region: Secondary | ICD-10-CM | POA: Insufficient documentation

## 2018-03-02 DIAGNOSIS — J449 Chronic obstructive pulmonary disease, unspecified: Secondary | ICD-10-CM | POA: Diagnosis not present

## 2018-03-02 DIAGNOSIS — M545 Low back pain, unspecified: Secondary | ICD-10-CM

## 2018-03-02 DIAGNOSIS — M48061 Spinal stenosis, lumbar region without neurogenic claudication: Secondary | ICD-10-CM | POA: Insufficient documentation

## 2018-03-02 DIAGNOSIS — M4807 Spinal stenosis, lumbosacral region: Secondary | ICD-10-CM

## 2018-03-02 DIAGNOSIS — N3289 Other specified disorders of bladder: Secondary | ICD-10-CM | POA: Diagnosis not present

## 2018-03-02 DIAGNOSIS — Q7649 Other congenital malformations of spine, not associated with scoliosis: Secondary | ICD-10-CM | POA: Insufficient documentation

## 2018-03-02 DIAGNOSIS — M5125 Other intervertebral disc displacement, thoracolumbar region: Secondary | ICD-10-CM | POA: Diagnosis not present

## 2018-03-02 NOTE — Progress Notes (Signed)
Daily Session Note  Patient Details  Name: Cincere Zorn MRN: [id number] Date of Birth: 1928-02-20 Referring Provider:     Pulmonary Rehab from 11/30/2017 in [facility] Cardiac and Pulmonary Rehab  Referring Provider  [name]      Encounter Date: 03/02/2018  Check In: Session Check In - 03/02/18 1044      Check-In   Supervising physician immediately available to respond to emergencies  [name] immediately available ER MD    Physician(s)  Lucita Lora and [place]    Location  [facility]-Cardiac & Pulmonary Rehab    Staff Present  Justin Mend Lorre Nick, MA, RCEP, CCRP, Exercise Physiologist;Talaysia Pinheiro Oletta Darter, [place], ACSM CEP, Exercise Physiologist    Medication changes reported      No    Fall or balance concerns reported     No    Warm-up and Cool-down  Performed as group-led instruction    Resistance Training Performed  Yes    VAD Patient?  No    PAD/SET Patient?  No      Pain Assessment   Currently in Pain?  No/denies    Multiple Pain Sites  No          Social History   Tobacco Use  Smoking Status Former Smoker  . Packs/day: 2.00  . Years: 22.00  . Pack years: 44.00  . Types: Cigarettes  . Last attempt to quit: 05/22/1972  . Years since quitting: 45.8  Smokeless Tobacco Never Used    Goals Met:  Proper associated with RPD/PD & O2 Sat Independence with exercise equipment Exercise tolerated well Strength training completed today  Goals Unmet:  Not Applicable  Comments: Pt able to follow exercise prescription today without complaint.  Will continue to monitor for progression.    Dr. Emily Filbert is Medical Director for [place] and [name].

## 2018-03-04 ENCOUNTER — Encounter: Payer: Medicare PPO | Admitting: *Deleted

## 2018-03-04 VITALS — Ht 65.5 in | Wt 154.0 lb

## 2018-03-04 DIAGNOSIS — J449 Chronic obstructive pulmonary disease, unspecified: Secondary | ICD-10-CM | POA: Diagnosis not present

## 2018-03-04 NOTE — Progress Notes (Signed)
Daily Session Note  Patient Details  Name: Maze Corniel MRN: [id number] Date of Birth: March 14, 1928 Referring Provider:     Pulmonary Rehab from 11/30/2017 in [facility] Cardiac and Pulmonary Rehab  Referring Provider  [name]      Encounter Date: 03/04/2018  Check In: Session Check In - 03/04/18 1018      Check-In   Supervising physician immediately available to respond to emergencies  [name] immediately available ER MD    Physician(s)  Dr. Alfred Levins and [facility]    Location  [facility]-Cardiac & Pulmonary Rehab    Staff Present  Renita Papa, RN BSN;[facility], [place], ACSM CEP, Exercise Physiologist    Medication changes reported      No    Fall or balance concerns reported     No    Tobacco Cessation  No Change    Warm-up and Cool-down  Performed as group-led instruction    Resistance Training Performed  Yes    VAD Patient?  No      Pain Assessment   Currently in Pain?  No/denies          Social History   Tobacco Use  Smoking Status Former Smoker  . Packs/day: 2.00  . Years: 22.00  . Pack years: 44.00  . Types: Cigarettes  . Last attempt to quit: 05/22/1972  . Years since quitting: 45.8  Smokeless Tobacco Never Used    Goals Met:  Proper associated with RPD/PD & O2 Sat Independence with exercise equipment Using PLB without cueing & demonstrates good technique Exercise tolerated well No report of cardiac concerns or symptoms Strength training completed today  Goals Unmet:  Not Applicable  Comments: Pt able to follow exercise prescription today without complaint.  Will continue to monitor for progression.    Dr. Emily Filbert is Medical Director for [place] and [name].

## 2018-03-07 ENCOUNTER — Encounter: Payer: Medicare PPO | Admitting: *Deleted

## 2018-03-07 DIAGNOSIS — J449 Chronic obstructive pulmonary disease, unspecified: Secondary | ICD-10-CM

## 2018-03-07 NOTE — Progress Notes (Signed)
Daily Session Note  Patient Details  Name: Caleb Khan MRN: [id number] Date of Birth: 01/08/1928 Referring Provider:     Pulmonary Rehab from 11/30/2017 in [facility] Cardiac and Pulmonary Rehab  Referring Provider  [name]      Encounter Date: 03/07/2018  Check In: Session Check In - 03/07/18 1028      Check-In   Supervising physician immediately available to respond to emergencies  [name] immediately available ER MD    Physician(s)  Dr. Quentin Cornwall and Dr. Corky Downs    Location  [facility]-Cardiac & Pulmonary Rehab    Staff Present  Earlean Shawl, BS, ACSM CEP, Exercise Physiologist;Mandi Ballard, BS, PEC;Nada Maclachlan, BA, ACSM CEP, Exercise Physiologist    Medication changes reported      No    Fall or balance concerns reported     No    Tobacco Cessation  No Change    Warm-up and Cool-down  Performed as group-led instruction    Resistance Training Performed  Yes    VAD Patient?  No    PAD/SET Patient?  No      Pain Assessment   Currently in Pain?  No/denies    Multiple Pain Sites  No          Social History   Tobacco Use  Smoking Status Former Smoker  . Packs/day: 2.00  . Years: 22.00  . Pack years: 44.00  . Types: Cigarettes  . Last attempt to quit: 05/22/1972  . Years since quitting: 45.8  Smokeless Tobacco Never Used    Goals Met:  Proper associated with RPD/PD & O2 Sat Independence with exercise equipment Exercise tolerated well No report of cardiac concerns or symptoms Strength training completed today  Goals Unmet:  Not Applicable  Comments: Pt able to follow exercise prescription today without complaint.  Will continue to monitor for progression.    Dr. Emily Filbert is Medical Director for [place] and [name].

## 2018-03-10 ENCOUNTER — Encounter: Payer: Self-pay | Admitting: *Deleted

## 2018-03-10 ENCOUNTER — Telehealth: Payer: Self-pay | Admitting: *Deleted

## 2018-03-10 DIAGNOSIS — J449 Chronic obstructive pulmonary disease, unspecified: Secondary | ICD-10-CM

## 2018-03-10 NOTE — Progress Notes (Signed)
Pulmonary Individual Treatment Plan  Patient Details  Name: Bard Haupert MRN: [id number] Date of Birth: [birth year] Referring Provider:     Pulmonary Rehab from 11/30/2017 in [facility] Cardiac and Pulmonary Rehab  Referring Provider  [name]      Initial Encounter Date:    Pulmonary Rehab from 11/30/2017 in [facility] Cardiac and Pulmonary Rehab  Date  11/30/17      Visit Diagnosis: Chronic obstructive pulmonary disease, unspecified COPD type ([place])  Patient's Home Medications on Admission:  Current Outpatient Medications:  .  amLODipine (NORVASC) 5 MG tablet, Take 5 mg by mouth 2 (two) times daily. , Disp: , Rfl:  .  aspirin EC 81 MG tablet, Take 81 mg by mouth daily., Disp: , Rfl:  .  CALCIUM-VITAMIN D PO, Take 1 tablet by mouth daily., Disp: , Rfl:  .  docusate sodium (COLACE) 100 MG capsule, Take 200 mg by mouth daily as needed for mild constipation., Disp: , Rfl:  .  HYDROcodone-acetaminophen (NORCO) 5-325 MG tablet, Take 1-2 tablets by mouth every 6 (six) hours as needed for moderate pain., Disp: 30 tablet, Rfl: 0 .  losartan (COZAAR) 25 MG tablet, Take 25 mg by mouth daily., Disp: , Rfl:  .  Multiple Vitamins-Minerals (MULTIVITAMIN PO), Take 1 tablet by mouth daily., Disp: , Rfl:  .  nitroGLYCERIN (NITROSTAT) 0.4 MG SL tablet, Place 0.4 mg under the tongue every 5 (five) minutes as needed for chest pain., Disp: , Rfl:  .  predniSONE (DELTASONE) 10 MG tablet, Take 10 mg by mouth daily with breakfast., Disp: , Rfl:  .  Red Yeast Rice Extract (RED YEAST RICE PO), Take 1 capsule by mouth 2 (two) times daily. , Disp: , Rfl:  .  terazosin (HYTRIN) 10 MG capsule, Take 10 mg by mouth at bedtime., Disp: , Rfl:   Past Medical History: Past Medical History:  Diagnosis Date  . Anemia   . Anginal pain ([place])   . Arthritis    osteoarthritis  . CHF (congestive heart failure) ([place])   . Chronic kidney disease   . Coronary artery disease   . Dyspnea    with exertion  . Hypertension     Tobacco  Use: Social History   Tobacco Use  Smoking Status Former Smoker  . Packs/day: 2.00  . Years: 22.00  . Pack years: 44.00  . Types: Cigarettes  . Last attempt to quit: 05/22/1972  . Years since quitting: 45.8  Smokeless Tobacco Never Used    Labs: Recent Review Flowsheet Data    There is no flowsheet data to display.       Pulmonary Assessment Scores: Pulmonary Assessment Scores    Row Name 11/30/17 1106 03/04/18 1159       ADL UCSD   ADL Phase  Entry  Exit    SOB Score total  33  39    Rest  0  0    Walk  2  3    Stairs  4  5    Bath  0  0    Dress  1  1    Shop  2  2      CAT Score   CAT Score  17  15      mMRC Score   mMRC Score  1  1       Pulmonary Function Assessment: Pulmonary Function Assessment - 11/30/17 1124      Initial Spirometry Results   FVC%  134 %    FEV1%  152 %    FEV1/FVC Ratio  76.13    Comments  best of 2 good patient effort      Post Bronchodilator Spirometry Results   FVC%  129.98 %    FEV1%  155.04 %    FEV1/FVC Ratio  80.41    Comments  best of 2 good patient effort      Breath   Bilateral Breath Sounds  Clear    Shortness of Breath  Yes;Limiting activity       Exercise Target Goals: Exercise Program Goal: Individual exercise prescription set using results from initial 6 min walk test and THRR while considering  patient's activity barriers and safety.   Exercise Prescription Goal: Initial exercise prescription builds to 30-45 minutes a day of aerobic activity, 2-3 days per week.  Home exercise guidelines will be given to patient during program as part of exercise prescription that the participant will acknowledge.  Activity Barriers & Risk Stratification:   6 Minute Walk: 6 Minute Walk    Row Name 11/30/17 1229 03/04/18 1038       6 Minute Walk   Phase  -  Discharge    Distance  1360 feet  1430 feet    Distance % Change  -  5 %    Distance Feet Change  -  70 ft    Walk Time  6 minutes  6 minutes    # of Rest  Breaks  0  0    MPH  2.58  2.7    METS  2.19  3.1    RPE  13  15    Perceived Dyspnea   2  3    VO2 Peak  7.66  10.7    Symptoms  No  Yes (comment)    Comments  -  Pt recently dx with spinal stenosis - has injection next week - this affects his gait more than breathing    Resting HR  64 bpm  70 bpm    Resting BP  120/54  122/62    Resting Oxygen Saturation   97 %  97 %    Exercise Oxygen Saturation  during 6 min walk  94 %  97 %    Max Ex. HR  108 bpm  76 bpm    Max Ex. BP  132/58  126/62    2 Minute Post BP  126/58  -      Interval HR   1 Minute HR  70  -    2 Minute HR  78  -    3 Minute HR  77  -    4 Minute HR  92  -    5 Minute HR  108  -    6 Minute HR  103  -    2 Minute Post HR  80  -    Interval Heart Rate?  Yes  -      Interval Oxygen   Interval Oxygen?  Yes  -    Baseline Oxygen Saturation %  97 %  -    1 Minute Oxygen Saturation %  95 %  -    1 Minute Liters of Oxygen  0 L  -    2 Minute Oxygen Saturation %  94 %  -    2 Minute Liters of Oxygen  0 L  -    3 Minute Oxygen Saturation %  94 %  -    3 Minute Liters of Oxygen  0 L  -    4 Minute Oxygen Saturation %  94 %  -    4 Minute Liters of Oxygen  0 L  -    5 Minute Oxygen Saturation %  94 %  -    5 Minute Liters of Oxygen  0 L  -    6 Minute Oxygen Saturation %  95 %  -    6 Minute Liters of Oxygen  0 L  -    2 Minute Post Oxygen Saturation %  97 %  -    2 Minute Post Liters of Oxygen  0 L  -      Oxygen Initial Assessment: Oxygen Initial Assessment - 11/30/17 1121      Home Oxygen   Home Oxygen Device  Home Concentrator    Sleep Oxygen Prescription  Continuous    Liters per minute  2    Home Exercise Oxygen Prescription  None    Home at Rest Exercise Oxygen Prescription  None    Compliance with Home Oxygen Use  Yes      Initial 6 min Walk   Oxygen Used  None      Program Oxygen Prescription   Program Oxygen Prescription  None      Intervention   Short Term Goals  To learn and understand  importance of monitoring SPO2 with pulse oximeter and demonstrate accurate use of the pulse oximeter.;To learn and understand importance of maintaining oxygen saturations>88%;To learn and demonstrate proper pursed lip breathing techniques or other breathing techniques.;To learn and exhibit compliance with exercise, home and travel O2 prescription    Long  Term Goals  Exhibits compliance with exercise, home and travel O2 prescription;Verbalizes importance of monitoring SPO2 with pulse oximeter and return demonstration;Maintenance of O2 saturations>88%;Exhibits proper breathing techniques, such as pursed lip breathing or other method taught during program session       Oxygen Re-Evaluation: Oxygen Re-Evaluation    Row Name 12/06/17 1043 12/13/17 1102 01/07/18 1029 02/07/18 1630       Program Oxygen Prescription   Program Oxygen Prescription  None  None  None  None      Home Oxygen   Home Oxygen Device  Home Concentrator  Home Concentrator  Home Concentrator  Home Concentrator    Sleep Oxygen Prescription  Continuous  Continuous  Continuous  Continuous    Liters per minute  _0 Home Exercise Oxygen Prescription  None  None  None  None    Home at Rest Exercise Oxygen Prescription  None  None  None  None    Compliance with Home Oxygen Use  Yes  Yes  Yes  Yes      Goals/Expected Outcomes   Short Term Goals  To learn and demonstrate proper pursed lip breathing techniques or other breathing techniques.;To learn and understand importance of monitoring SPO2 with pulse oximeter and demonstrate accurate use of the pulse oximeter.;To learn and understand importance of maintaining oxygen saturations>88%;To learn and exhibit compliance with exercise, home and travel O2 prescription  To learn and exhibit compliance with exercise, home and travel O2 prescription;To learn and understand importance of monitoring SPO2 with pulse oximeter and demonstrate accurate use of the pulse oximeter.;To learn and  understand importance of maintaining oxygen saturations>88%;To learn and demonstrate proper pursed lip breathing techniques or other breathing techniques.;To learn and demonstrate proper use of respiratory medications  To learn and exhibit compliance with exercise, home  and travel O2 prescription;To learn and understand importance of monitoring SPO2 with pulse oximeter and demonstrate accurate use of the pulse oximeter.;To learn and understand importance of maintaining oxygen saturations>88%;To learn and demonstrate proper pursed lip breathing techniques or other breathing techniques.  To learn and exhibit compliance with exercise, home and travel O2 prescription;To learn and understand importance of monitoring SPO2 with pulse oximeter and demonstrate accurate use of the pulse oximeter.;To learn and understand importance of maintaining oxygen saturations>88%;To learn and demonstrate proper pursed lip breathing techniques or other breathing techniques.    Long  Term Goals  -  Exhibits compliance with exercise, home and travel O2 prescription;Verbalizes importance of monitoring SPO2 with pulse oximeter and return demonstration;Maintenance of O2 saturations>88%;Exhibits proper breathing techniques, such as pursed lip breathing or other method taught during program session;Compliance with respiratory medication;Demonstrates proper use of MDI's  Exhibits compliance with exercise, home and travel O2 prescription;Verbalizes importance of monitoring SPO2 with pulse oximeter and return demonstration;Maintenance of O2 saturations>88%;Exhibits proper breathing techniques, such as pursed lip breathing or other method taught during program session  Exhibits compliance with exercise, home and travel O2 prescription;Verbalizes importance of monitoring SPO2 with pulse oximeter and return demonstration;Maintenance of O2 saturations>88%;Exhibits proper breathing techniques, such as pursed lip breathing or other method taught  during program session    Comments  Reviewed PLB with patient. He demonstrated understanding of these concepts.   Reviewed PLB with patient. He demonstrated understanding of these concepts and stated he does [place] PLB at home when he gets SOB.   Maddux is doing well with his PLB. He states when he is breathing hard he automatically does it. Incline and stairs are the things that makes him short of breath. He is wearing is 2 liter nasal cannula for sleep everynight.   Johm states he does not want to wear his oxygen when he sleeps. Informed him if it is ordered he should wear it for now and talk to his doctor. Informed him that he does not know what his oxygen saturation is at home when he sleeps.    Goals/Expected Outcomes  Short: Patient will use PLB during exercise to control SOB Long: Patient will become independent with PLB to help control SOB with ADL's.   Short: Patient will use PLB during exercise to control SOB Long: Patient will become independent with PLB to help control SOB with ADL's.   Short: increase incline to help shob while walking up stairs. Long: Maintain incline and improve Shortness of breath when going up stairs.  Short: talk to his doctor about sleep oxygen prescription. Long: adhere to sleep oxygen prescription.       Oxygen Discharge (Final Oxygen Re-Evaluation): Oxygen Re-Evaluation - 02/07/18 1630      Program Oxygen Prescription   Program Oxygen Prescription  None      Home Oxygen   Home Oxygen Device  Home Concentrator    Sleep Oxygen Prescription  Continuous    Liters per minute  2    Home Exercise Oxygen Prescription  None    Home at Rest Exercise Oxygen Prescription  None    Compliance with Home Oxygen Use  Yes      Goals/Expected Outcomes   Short Term Goals  To learn and exhibit compliance with exercise, home and travel O2 prescription;To learn and understand importance of monitoring SPO2 with pulse oximeter and demonstrate accurate use of the pulse oximeter.;To learn  and understand importance of maintaining oxygen saturations>88%;To learn and demonstrate proper pursed lip breathing  techniques or other breathing techniques.    Long  Term Goals  Exhibits compliance with exercise, home and travel O2 prescription;Verbalizes importance of monitoring SPO2 with pulse oximeter and return demonstration;Maintenance of O2 saturations>88%;Exhibits proper breathing techniques, such as pursed lip breathing or other method taught during program session    Comments  Hager states he does not want to wear his oxygen when he sleeps. Informed him if it is ordered he should wear it for now and talk to his doctor. Informed him that he does not know what his oxygen saturation is at home when he sleeps.    Goals/Expected Outcomes  Short: talk to his doctor about sleep oxygen prescription. Long: adhere to sleep oxygen prescription.       Initial Exercise Prescription: Initial Exercise Prescription - 11/30/17 1200      Date of Initial Exercise RX and Referring Provider   Date  11/30/17    Referring Provider  [name]      Treadmill   MPH  2    Grade  0.5    Minutes  15    METs  2.6      Recumbant Bike   Level  2    RPM  60    Watts  10    Minutes  15    METs  2.4      REL-XR   Level  2    Speed  50    Minutes  15    METs  2.4      T5 Nustep   Level  2    SPM  80    Minutes  15    METs  2.4      Prescription Details   Frequency (times per week)  3    Duration  Progress to 45 minutes of aerobic exercise without signs/symptoms of physical distress      Intensity   THRR 40-80% of Max Heartrate  92-119    Ratings of Perceived Exertion  11-15    Perceived Dyspnea  0-4      Resistance Training   Training Prescription  Yes    Weight  4 lb    Reps  10-15       Perform Capillary Blood Glucose checks as needed.  Exercise Prescription Changes: Exercise Prescription Changes    Row Name 12/14/17 1300 12/29/17 1200 01/12/18 1400 01/26/18 1500 02/09/18 1200      Response to Exercise   Blood Pressure (Admit)  112/60  118/54  116/64  112/56  108/64   Blood Pressure (Exercise)  130/70  -  -  -  -   Blood Pressure (Exit)  130/80  104/58  126/62  124/70  112/54   Heart Rate (Admit)  64 bpm  63 bpm  64 bpm  70 bpm  73 bpm   Heart Rate (Exercise)  90 bpm  78 bpm  76 bpm  87 bpm  79 bpm   Heart Rate (Exit)  62 bpm  63 bpm  54 bpm  63 bpm  67 bpm   Oxygen Saturation (Admit)  98 %  -  97 %  96 %  98 %   Oxygen Saturation (Exercise)  97 %  95 %  96 %  96 %  95 %   Oxygen Saturation (Exit)  93 %  98 %  94 %  98 %  98 %   Rating of Perceived Exertion (Exercise)  _0 15  Perceived Dyspnea (Exercise)  _0 -   Symptoms  none  none  none  none  none   Duration  Continue with 45 min of aerobic exercise without signs/symptoms of physical distress.  Continue with 45 min of aerobic exercise without signs/symptoms of physical distress.  Continue with 45 min of aerobic exercise without signs/symptoms of physical distress.  Continue with 45 min of aerobic exercise without signs/symptoms of physical distress.  Continue with 45 min of aerobic exercise without signs/symptoms of physical distress.   Intensity  THRR New  THRR unchanged  THRR New reaching RPE of 14 - THR R + 30  THRR unchanged  THRR unchanged     Progression   Progression  Continue to progress workloads to maintain intensity without signs/symptoms of physical distress.  Continue to progress workloads to maintain intensity without signs/symptoms of physical distress.  Continue to progress workloads to maintain intensity without signs/symptoms of physical distress.  Continue to progress workloads to maintain intensity without signs/symptoms of physical distress.  Continue to progress workloads to maintain intensity without signs/symptoms of physical distress.   Average METs  2.8  2.85  3.2  3.35  3.8     Resistance Training   Training Prescription  Yes  Yes  Yes  Yes  Yes   Weight  4 lb  4 lb   4 lb  4 lb  4 lb   Reps  10-15  10-15  10-15  10-15  10-15     Interval Training   Interval Training  -  -  -  No  No     Treadmill   MPH  2  -  2.5  2.5  2.5   Grade  3  -  _1 Minutes  15  -  _2 METs  3.3  -  3.95  3.95  3.95     Recumbant Bike   Level  -  5  -  6  6   RPM  -  60  -  64  60   Watts  -  34  -  43  38   Minutes  -  15  -  15  15   METs  -  3.4  -  3.8  3.73     T5 Nustep   Level  _3 -   SPM  80  80  80  89  -   Minutes  _4 -   METs  2.3  2.3  2.4  2.3  -     Home Exercise Plan   Plans to continue exercise at  -  [employer] (comment) twin [place] (comment) twin [employer] (comment) twin [employer]  -  Add 2 additional days to program exercise sessions.  -  Add 2 additional days to program exercise sessions.  Add 2 additional days to program exercise sessions.   Initial Home Exercises Provided  -  12/29/17  -  12/29/17  12/29/17   Row Name 02/24/18 1000             Response to Exercise   Blood Pressure (Admit)  114/56       Blood Pressure (Exit)  104/50       Heart Rate (Admit)  66 bpm       Heart Rate (Exercise)  112 bpm       Heart Rate (Exit)  64 bpm       Oxygen Saturation (Admit)  96 %       Oxygen Saturation (Exercise)  93 %       Oxygen Saturation (Exit)  97 %       Rating of Perceived Exertion (Exercise)  14       Perceived Dyspnea (Exercise)  2       Symptoms  none       Duration  Continue with 45 min of aerobic exercise without signs/symptoms of physical distress.       Intensity  THRR unchanged         Progression   Progression  Continue to progress workloads to maintain intensity without signs/symptoms of physical distress.       Average METs  3.23         Resistance Training   Training Prescription  Yes       Weight  4 lb       Reps  10-15         Interval Training   Interval Training  No         Treadmill   MPH  2.5       Grade  3        Minutes  15       METs  3.95         Recumbant Bike   Level  6       RPM  60       Minutes  15       METs  3.72         T5 Nustep   Level  5       Minutes  15       METs  2.3         Home Exercise Plan   Plans to continue exercise at  [employer] (comment) twin [place]       Frequency  Add 2 additional days to program exercise sessions.       Initial Home Exercises Provided  12/29/17          Exercise Comments: Exercise Comments    Row Name 12/06/17 1041 12/29/17 1223         Exercise Comments   First full day of exercise!  Patient was oriented to gym and equipment including functions, settings, policies, and procedures.  Patient's individual exercise prescription and treatment plan were reviewed.  All starting workloads were established based on the results of the 6 minute walk test done at initial orientation visit.  The plan for exercise progression was also introduced and progression will be customized based on patient's performance and goals.  Reviewed home exercise with pt today.  Pt plans to use gym at [facility] for exercise.  Reviewed THR, pulse, RPE, sign and symptoms, NTG use, and when to call 911 or MD.  Also discussed weather considerations and indoor options.  Pt voiced understanding.         Exercise Goals and Review: Exercise Goals    Row Name 11/30/17 1228             Exercise Goals   Increase Physical Activity  Yes       Intervention  Provide advice, education, support and counseling about physical activity/exercise needs.;Develop an individualized exercise prescription for aerobic and resistive training  based on initial evaluation findings, risk stratification, comorbidities and participant's personal goals.       Expected Outcomes  Short Term: Attend rehab on a regular basis to increase amount of physical activity.;Long Term: Add in home exercise to make exercise part of routine and to increase amount of physical activity.;Long Term: Exercising  regularly at least 3-5 days a week.       Increase Strength and Stamina  Yes       Intervention  Provide advice, education, support and counseling about physical activity/exercise needs.;Develop an individualized exercise prescription for aerobic and resistive training based on initial evaluation findings, risk stratification, comorbidities and participant's personal goals.       Expected Outcomes  Short Term: Increase workloads from initial exercise prescription for resistance, speed, and METs.;Short Term: Perform resistance training exercises routinely during rehab and add in resistance training at home;Long Term: Improve cardiorespiratory fitness, muscular endurance and strength as measured by increased METs and functional capacity (6MWT)       Able to understand and use rate of perceived exertion (RPE) scale  Yes       Intervention  Provide education and explanation on how to use RPE scale       Expected Outcomes  Short Term: Able to use RPE daily in rehab to express subjective intensity level;Long Term:  Able to use RPE to guide intensity level when exercising independently       Able to understand and use Dyspnea scale  Yes       Intervention  Provide education and explanation on how to use Dyspnea scale       Expected Outcomes  Short Term: Able to use Dyspnea scale daily in rehab to express subjective sense of shortness of breath during exertion;Long Term: Able to use Dyspnea scale to guide intensity level when exercising independently       Knowledge and understanding of Target Heart Rate Range (THRR)  Yes       Intervention  Provide education and explanation of THRR including how the numbers were predicted and where they are located for reference       Expected Outcomes  Short Term: Able to state/look up THRR;Short Term: Able to use daily as guideline for intensity in rehab;Long Term: Able to use THRR to govern intensity when exercising independently       Able to check pulse independently  Yes        Intervention  Provide education and demonstration on how to check pulse in carotid and radial arteries.;Review the importance of being able to check your own pulse for safety during independent exercise       Expected Outcomes  Short Term: Able to explain why pulse checking is important during independent exercise;Long Term: Able to check pulse independently and accurately       Understanding of Exercise Prescription  Yes       Intervention  Provide education, explanation, and written materials on patient's individual exercise prescription       Expected Outcomes  Short Term: Able to explain program exercise prescription;Long Term: Able to explain home exercise prescription to exercise independently          Exercise Goals Re-Evaluation : Exercise Goals Re-Evaluation    Row Name 12/06/17 1041 12/13/17 1050 12/14/17 1323 12/29/17 1223 01/12/18 1449     Exercise Goal Re-Evaluation   Exercise Goals Review  Increase Physical Activity;Increase Strength and Stamina;Able to understand and use rate of perceived exertion (RPE) scale;Knowledge and understanding of  Target Heart Rate Range (THRR);Able to understand and use Dyspnea scale;Understanding of Exercise Prescription  Increase Physical Activity;Increase Strength and Stamina;Able to understand and use rate of perceived exertion (RPE) scale;Able to understand and use Dyspnea scale;Knowledge and understanding of Target Heart Rate Range (THRR);Understanding of Exercise Prescription  Increase Physical Activity;Increase Strength and Stamina;Able to understand and use Dyspnea scale;Able to understand and use rate of perceived exertion (RPE) scale  Increase Physical Activity;Increase Strength and Stamina;Able to understand and use rate of perceived exertion (RPE) scale;Able to understand and use Dyspnea scale;Knowledge and understanding of Target Heart Rate Range (THRR);Able to check pulse independently;Understanding of Exercise Prescription  Increase  Physical Activity;Able to understand and use rate of perceived exertion (RPE) scale;Understanding of Exercise Prescription;Increase Strength and Stamina;Able to understand and use Dyspnea scale   Comments  Reviewed RPE scale, THR and program prescription with pt today.  Pt voiced understanding and was given a copy of goals to take home.   Travontae feels like he is getting a good start to the program and has had no complications with his exercise so far. He also walks at home.  Tel has tolerated exercise well in first sessions.  He has increased incline on TM.  Staff will monitor progress.  Reviewed home exercise with pt today.  Pt plans to use gym at [facility] for exercise.  Reviewed THR, pulse, RPE, sign and symptoms, NTG use, and when to call 911 or MD.  Also discussed weather considerations and indoor options.  Pt voiced understanding.  [place] has increased overall MET level.  Staff will monitor progress   Expected Outcomes  Short: Use RPE daily to regulate intensity.  Long: Follow program prescription in [facility].  Short: attend Pulmonary rehab on a regular basis. Long: Increase met level and improve SOB.   Short - Pt will attend regularly Long - Pt will improve MET level  Short - Pt will exercise on days not at [facility] and monitor HR and 02 - will purchase oximeter for home Long - Pt will maintain exercise gains after completing LW  Short - Wyeth will continue to attend regularly Long - Dyquan will continue to improve MET level   Row Name 01/26/18 1523 02/09/18 1256 02/24/18 1014         Exercise Goal Re-Evaluation   Exercise Goals Review  Increase Physical Activity;Increase Strength and Stamina;Understanding of Exercise Prescription  Increase Physical Activity;Able to understand and use rate of perceived exertion (RPE) scale;Increase Strength and Stamina  Increase Physical Activity;Able to understand and use rate of perceived exertion (RPE) scale;Increase Strength and Stamina;Able to understand and use Dyspnea  scale;Understanding of Exercise Prescription     Comments  Jovane continues to do well in rehab.  He is now doing level 6 on the recumbent bike.  We will continue to monitor his pogression.   Conny is progressing well with exercise and increasing overall MET level  Stellan is tolerating exercise well.  He has increased MET level.     Expected Outcomes  Short: Move up workload on treadmill.  Long: Continue to exercise on off days.   Short - continue to attend consistently Long - increase METs from current level  Short - continue to attend consistently Long - maintain fitness on his own        Discharge Exercise Prescription (Final Exercise Prescription Changes): Exercise Prescription Changes - 02/24/18 1000      Response to Exercise   Blood Pressure (Admit)  114/56    Blood Pressure (  Exit)  104/50    Heart Rate (Admit)  66 bpm    Heart Rate (Exercise)  112 bpm    Heart Rate (Exit)  64 bpm    Oxygen Saturation (Admit)  96 %    Oxygen Saturation (Exercise)  93 %    Oxygen Saturation (Exit)  97 %    Rating of Perceived Exertion (Exercise)  14    Perceived Dyspnea (Exercise)  2    Symptoms  none    Duration  Continue with 45 min of aerobic exercise without signs/symptoms of physical distress.    Intensity  THRR unchanged      Progression   Progression  Continue to progress workloads to maintain intensity without signs/symptoms of physical distress.    Average METs  3.23      Resistance Training   Training Prescription  Yes    Weight  4 lb    Reps  10-15      Interval Training   Interval Training  No      Treadmill   MPH  2.5    Grade  3    Minutes  15    METs  3.95      Recumbant Bike   Level  6    RPM  60    Minutes  15    METs  3.72      T5 Nustep   Level  5    Minutes  15    METs  2.3      Home Exercise Plan   Plans to continue exercise at  [employer] (comment)   [place]   Frequency  Add 2 additional days to program exercise sessions.    Initial Home  Exercises Provided  12/29/17       Nutrition:  Target Goals: Understanding of nutrition guidelines, daily intake of sodium <1567m, cholesterol <2038m calories 30% from fat and 7% or less from saturated fats, daily to have 5 or more servings of fruits and vegetables.  Biometrics: Pre Biometrics - 11/30/17 1228      Pre Biometrics   Height  5' 5.5" (1.664 m)    Weight  161 lb 11.2 oz (73.3 kg)    Waist Circumference  36.5 inches    Hip Circumference  38.5 inches    Waist to Hip Ratio  0.95 %    BMI (Calculated)  26.49      Post Biometrics - 03/04/18 1037       Post  Biometrics   Height  5' 5.5" (1.664 m)    Weight  154 lb (69.9 kg)    Waist Circumference  35 inches    Hip Circumference  38.5 inches    Waist to Hip Ratio  0.91 %    BMI (Calculated)  25.23       Nutrition Therapy Plan and Nutrition Goals: Nutrition Therapy & Goals - 11/30/17 1119      Personal Nutrition Goals   Comments  Lose a little weight, Learn how to eat healthier      Intervention Plan   Intervention  Prescribe, educate and counsel regarding individualized specific dietary modifications aiming towards targeted core components such as weight, hypertension, lipid management, diabetes, heart failure and other comorbidities.;Nutrition handout(s) given to patient.    Expected Outcomes  Short Term Goal: Understand basic principles of dietary content, such as calories, fat, sodium, cholesterol and nutrients.;Long Term Goal: Adherence to prescribed nutrition plan.       Nutrition Assessments: Nutrition Assessments -  03/04/18 1201      MEDFICTS Scores   Pre Score  49    Post Score  37    Score Difference  -12       Nutrition Goals Re-Evaluation: Nutrition Goals Re-Evaluation    Row Name 12/13/17 1100 01/07/18 1034 02/07/18 1633         Goals   Current Weight  -  157 lb (71.2 kg)  156 lb (70.8 kg)     Nutrition Goal  -  He wants to be 150 pounds.   He wants to be 150 pounds.      Comment  Set  Bacilio up for a dietician appointment on 12/22/17  He has cut out desserts and it has been helping him lose weight. His wife cooks healthy and they both watch what they eat.  Thorsten states that he has not new goals for nutrition. He wants to lose a few more pounds still before the end of the program.     Expected Outcome  Short: Meet with dietician Long: follow nutician goals set during dietician meeting.   Short: lose 3-5 pounds more. Long: Maintain weight loss  Short: lose 3-5 pounds more. Long: Maintain weight loss        Nutrition Goals Discharge (Final Nutrition Goals Re-Evaluation): Nutrition Goals Re-Evaluation - 02/07/18 1633      Goals   Current Weight  156 lb (70.8 kg)    Nutrition Goal  He wants to be 150 pounds.     Comment  Isom states that he has not new goals for nutrition. He wants to lose a few more pounds still before the end of the program.    Expected Outcome  Short: lose 3-5 pounds more. Long: Maintain weight loss       Psychosocial: Target Goals: Acknowledge presence or absence of significant depression and/or stress, maximize coping skills, provide positive support system. Participant is able to verbalize types and ability to use techniques and skills needed for reducing stress and depression.   Initial Review & Psychosocial Screening: Initial Psych Review & Screening - 11/30/17 1117      Initial Review   Current issues with  Current Stress Concerns    Source of Stress Concerns  Chronic Illness    Comments  He states his COPD is is the main thing that stresses him out.      Family Dynamics   Good Support System?  Yes    Comments  He can look to his wife and daughter for support      Barriers   Psychosocial barriers to participate in program  There are no identifiable barriers or psychosocial needs.;The patient should benefit from training in stress management and relaxation.      Screening Interventions   Interventions  Provide feedback about the scores to  participant;Encouraged to exercise;Program counselor consult;To provide support and resources with identified psychosocial needs    Expected Outcomes  Short Term goal: Utilizing psychosocial counselor, staff and physician to assist with identification of specific Stressors or current issues interfering with healing process. Setting desired goal for each stressor or current issue identified.;Long Term Goal: Stressors or current issues are controlled or eliminated.;Short Term goal: Identification and review with participant of any Quality of Life or Depression concerns found by scoring the questionnaire.;Long Term goal: The participant improves quality of Life and PHQ9 Scores as seen by post scores and/or verbalization of changes       Quality of Life Scores:  Scores of 19  and below usually indicate a poorer quality of life in these areas.  A difference of  2-3 points is a clinically meaningful difference.  A difference of 2-3 points in the total score of the Quality of Life Index has been associated with significant improvement in overall quality of life, self-image, physical symptoms, and general health in studies assessing change in quality of life.  PHQ-9: Recent Review Flowsheet Data    Depression screen [facility] 2/9 03/04/2018 11/30/2017   Decreased Interest 1 0   Down, Depressed, Hopeless 0 0   PHQ - 2 Score 1 0   Altered sleeping 0 0   Tired, decreased energy 1 1   Change in appetite 0 0   Feeling bad or failure about yourself  0 1   Trouble concentrating 0 0   Moving slowly or fidgety/restless 0 0   Suicidal thoughts 0 0   PHQ-9 Score 2 2   Difficult doing work/chores Not difficult at all Somewhat difficult     Interpretation of Total Score  Total Score Depression Severity:  1-4 = Minimal depression, 5-9 = Mild depression, 10-14 = Moderate depression, 15-19 = Moderately severe depression, 20-27 = Severe depression   Psychosocial Evaluation and Intervention: Psychosocial Evaluation -  12/13/17 1108      Psychosocial Evaluation & Interventions   Interventions  Encouraged to exercise with the program and follow exercise prescription;Relaxation education    Comments  Counselor met with Mr. Bublitz Ehle) today for initial psychosocial evaluation.  He is an 84 year old who has COPD.  Kathryn has a strong support system with a spouse of 17 years; a daughter in [place]; 6 Grandchildren and (3) Designer, industrial/product.  Teren also lives in [place] residential community and has lots of supports there as well.  He reports sleeping well with Oxygen at night only and he has a good appetite.  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  His mood is generally positive and he has minimal stress in his life other than his health.  Christopher has goals to lose weight and increase his endurance while in this program.  He will continue working out at [employer] after completion of this program.      Expected Outcomes  Short:  Reyan will meet with the dietician to address his weight loss goals.   Long:  Brittain will exercise consistently to increase his endurance.    Continue Psychosocial Services   Follow up required by staff       Psychosocial Re-Evaluation: Psychosocial Re-Evaluation    [place] Name 01/07/18 1038 01/12/18 1041 02/07/18 1633 03/02/18 1111       Psychosocial Re-Evaluation   Current issues with  Current Stress Concerns;Current Sleep Concerns  Current Depression  Current Depression  Current Stress Concerns    Comments  His doctor states he needs to drink alot of water and sometimes it keeps him up at night using the bathroom. His attitude is neutral. He is willing to work and get healthier. He does not take any medications for his COPD which he should look at as a positive.  Counselor follow up with Milbert Coulter today reporting noticing some positive benefits from this program with a little more endurance.   He continues to have some sleep problems but gets  at least 6 hours although intermittent with restroom visits.  He admits to being somewhat pessimistic and calls it "realistic" looking at his health condition gradually worsen.  Counselor discussed this with him and ways to see his life through a different lens - of what he can do vs. his limitations due to his health.  There is a lot of loss Christia is experiencing from his health decline and counselor suggested him speaking with someone regularly about this.  He declined the offer and stated he has a strong support system who will help him through this.  staff will continue to follow.  Arian states that there is no new concernes with his mental health at this time.  Counselor met with Milbert Coulter today for follow up.  He reports "no difference" in his breathing since coming into this program.  However, he has lost approximately 10 pounds and counselor commended him on accomplishing that goal.  He reports the walking and treadmill are most helpful as he has chronic back pain and stenosis.  He had an MRI today and is awaiting recommendations by his Dr.  Aldean Ast will continue to follow with Milbert Coulter.    Expected Outcomes  Short: continue to exercise to decrease stress. Long: Maintain exercise post [name] to keep stress at a minimum  Short:  Continue to focus on what he can do vs his limitations to maintain a more positive approach.   Long:  Continue to exercise for stress and to accomplish his goals.    Short: Attend [name] to decrease stress. Long: Maintain exercise Post [name] to keep stress at a minimum.  Short:  Ona will continue to exercise to maintain his weight loss and manage his stress re: his health.   Long:  Jyden will continue exercise as a life style for his overall health and quality of life.      Interventions  Encouraged to attend Pulmonary Rehabilitation for the exercise  Stress management education  Encouraged to attend Pulmonary Rehabilitation for the exercise  -    Continue Psychosocial Services    Follow up required by staff  Follow up required by staff  Follow up required by staff  Follow up required by staff       Psychosocial Discharge (Final Psychosocial Re-Evaluation): Psychosocial Re-Evaluation - 03/02/18 1111      Psychosocial Re-Evaluation   Current issues with  Current Stress Concerns    Comments  Counselor met with Milbert Coulter today for follow up.  He reports "no difference" in his breathing since coming into this program.  However, he has lost approximately 10 pounds and counselor commended him on accomplishing that goal.  He reports the walking and treadmill are most helpful as he has chronic back pain and stenosis.  He had an MRI today and is awaiting recommendations by his Dr.  Aldean Ast will continue to follow with Milbert Coulter.    Expected Outcomes  Short:  Jusitn will continue to exercise to maintain his weight loss and manage his stress re: his health.   Long:  Desman will continue exercise as a life style for his overall health and quality of life.      Continue Psychosocial Services   Follow up required by staff       Education: Education Goals: Education classes will be provided on a weekly basis, covering required topics. Participant will state understanding/return demonstration of topics presented.  Learning Barriers/Preferences: Learning Barriers/Preferences - 11/30/17 1120      Learning Barriers/Preferences   Learning Barriers  Sight   wears glasses   Learning Preferences  None       Education Topics:  Initial Evaluation Education: - Verbal, written and demonstration  of respiratory meds, oximetry and breathing techniques. Instruction on use of nebulizers and MDIs and importance of monitoring MDI activations.   Pulmonary Rehab from 03/02/2018 in [facility] Cardiac and Pulmonary Rehab  Date  11/30/17  Educator  [facility]  Instruction Review Code  1- Verbalizes Understanding      General Nutrition Guidelines/Fats and Fiber: -Group instruction provided by verbal, written material,  models and posters to present the general guidelines for heart healthy nutrition. Gives an explanation and review of dietary fats and fiber.   Pulmonary Rehab from 03/02/2018 in [facility] Cardiac and Pulmonary Rehab  Date  02/07/18  Educator  CR  Instruction Review Code  5- Refused Teaching      Controlling Sodium/Reading Food Labels: -Group verbal and written material supporting the discussion of sodium use in heart healthy nutrition. Review and explanation with models, verbal and written materials for utilization of the food label.   Pulmonary Rehab from 03/02/2018 in [facility] Cardiac and Pulmonary Rehab  Date  12/27/17  Educator  CR  Instruction Review Code  1- Verbalizes Understanding      Exercise Physiology & General Exercise Guidelines: - Group verbal and written instruction with models to review the exercise physiology of the cardiovascular system and associated critical values. Provides general exercise guidelines with specific guidelines to those with heart or lung disease.    Aerobic Exercise & Resistance Training: - Gives group verbal and written instruction on the various components of exercise. Focuses on aerobic and resistive training programs and the benefits of this training and how to safely progress through these programs.   Pulmonary Rehab from 03/02/2018 in [facility] Cardiac and Pulmonary Rehab  Date  02/09/18  Educator  [facility]  Instruction Review Code  1- Verbalizes Understanding      Flexibility, Balance, Mind/Body Relaxation: Provides group verbal/written instruction on the benefits of flexibility and balance training, including mind/body exercise modes such as yoga, pilates and tai chi.  Demonstration and skill practice provided.   Pulmonary Rehab from 03/02/2018 in [facility] Cardiac and Pulmonary Rehab  Date  02/25/18  Educator  [facility]  Instruction Review Code  1- Verbalizes Understanding      Stress and Anxiety: - Provides group verbal and written instruction about the health risks of  elevated stress and causes of high stress.  Discuss the correlation between heart/lung disease and anxiety and treatment options. Review healthy ways to manage with stress and anxiety.   Pulmonary Rehab from 03/02/2018 in [facility] Cardiac and Pulmonary Rehab  Date  03/02/18  Educator  [facility]  Instruction Review Code  1- Verbalizes Understanding      Depression: - Provides group verbal and written instruction on the correlation between heart/lung disease and depressed mood, treatment options, and the stigmas associated with seeking treatment.   Pulmonary Rehab from 03/02/2018 in [facility] Cardiac and Pulmonary Rehab  Date  02/02/18  Educator  [facility]  Instruction Review Code  1- Verbalizes Understanding      Exercise & Equipment Safety: - Individual verbal instruction and demonstration of equipment use and safety with use of the equipment.   Pulmonary Rehab from 03/02/2018 in [facility] Cardiac and Pulmonary Rehab  Date  11/30/17  Educator  [facility]  Instruction Review Code  1- Verbalizes Understanding      Infection Prevention: - Provides verbal and written material to individual with discussion of infection control including proper hand washing and proper equipment cleaning during exercise session.   Pulmonary Rehab from 03/02/2018 in [facility] Cardiac and Pulmonary Rehab  Date  11/30/17  Educator  [place]  Instruction Review Code  1- Verbalizes Understanding      Falls Prevention: - Provides verbal and written material to individual with discussion of falls prevention and safety.   Pulmonary Rehab from 03/02/2018 in [facility] Cardiac and Pulmonary Rehab  Date  11/30/17  Educator  [facility]  Instruction Review Code  1- Verbalizes Understanding      Diabetes: - Individual verbal and written instruction to review signs/symptoms of diabetes, desired ranges of glucose level fasting, after meals and with exercise. Advice that pre and post exercise glucose checks will be done for 3 sessions at entry of program.   Chronic Lung  Diseases: - Group verbal and written instruction to review updates, respiratory medications, advancements in procedures and treatments. Discuss use of supplemental oxygen including available portable oxygen systems, continuous and intermittent flow rates, concentrators, personal use and safety guidelines. Review proper use of inhaler and spacers. Provide informative websites for self-education.    Pulmonary Rehab from 03/02/2018 in [facility] Cardiac and Pulmonary Rehab  Date  01/12/18  Educator  [facility]  Instruction Review Code  1- Verbalizes Understanding      Energy Conservation: - Provide group verbal and written instruction for methods to conserve energy, plan and organize activities. Instruct on pacing techniques, use of adaptive equipment and posture/positioning to relieve shortness of breath.   Triggers and Exacerbations: - Group verbal and written instruction to review types of environmental triggers and ways to prevent exacerbations. Discuss weather changes, air quality and the benefits of nasal washing. Review warning signs and symptoms to help prevent infections. Discuss techniques for effective airway clearance, coughing, and vibrations.   Pulmonary Rehab from 03/02/2018 in [facility] Cardiac and Pulmonary Rehab  Date  01/28/18  Educator  [facility]  Instruction Review Code  1- Verbalizes Understanding      AED/CPR: - Group verbal and written instruction with the use of models to demonstrate the basic use of the AED with the basic ABC's of resuscitation.   Anatomy and Physiology of the Lungs: - Group verbal and written instruction with the use of models to provide basic lung anatomy and physiology related to function, structure and complications of lung disease.   Pulmonary Rehab from 03/02/2018 in [facility] Cardiac and Pulmonary Rehab  Date  12/29/17  Educator  [facility]  Instruction Review Code  1- Verbalizes Understanding      Anatomy & Physiology of the Heart: - Group verbal and written instruction and  models provide basic cardiac anatomy and physiology, with the coronary electrical and arterial systems. Review of Valvular disease and Heart Failure   Pulmonary Rehab from 03/02/2018 in [facility] Cardiac and Pulmonary Rehab  Date  02/23/18  Educator  [facility]  Instruction Review Code  1- Verbalizes Understanding      Cardiac Medications: - Group verbal and written instruction to review commonly prescribed medications for heart disease. Reviews the medication, class of the drug, and side effects.   Know Your Numbers and Risk Factors: -Group verbal and written instruction about important numbers in your health.  Discussion of what are risk factors and how they play a role in the disease process.  Review of Cholesterol, Blood Pressure, Diabetes, and BMI and the role they play in your overall health.   Pulmonary Rehab from 03/02/2018 in [facility] Cardiac and Pulmonary Rehab  Date  01/26/18  Educator  [facility]  Instruction Review Code  1- Verbalizes Understanding      Sleep Hygiene: -Provides group verbal and written instruction about how  sleep can affect your health.  Define sleep hygiene, discuss sleep cycles and impact of sleep habits. Review good sleep hygiene tips.    Pulmonary Rehab from 03/02/2018 in [facility] Cardiac and Pulmonary Rehab  Date  01/19/18  Educator  [facility]  Instruction Review Code  1- Verbalizes Understanding      Other: -Provides group and verbal instruction on various topics (see comments)    Knowledge Questionnaire Score: Knowledge Questionnaire Score - 03/04/18 1201      Knowledge Questionnaire Score   Pre Score  14/18    Post Score  15/18   reviewed with patient       Core Components/Risk Factors/Patient Goals at Admission: Personal Goals and Risk Factors at Admission - 11/30/17 1122      Core Components/Risk Factors/Patient Goals on Admission    Weight Management  Yes;Weight Loss;Weight Maintenance    Intervention  Weight Management: Develop a combined nutrition and exercise  program designed to reach desired caloric intake, while maintaining appropriate intake of nutrient and fiber, sodium and fats, and appropriate energy expenditure required for the weight goal.;Weight Management/Obesity: Establish reasonable short term and long term weight goals.;Weight Management: Provide education and appropriate resources to help participant work on and attain dietary goals.    Admit Weight  161 lb 11.2 oz (73.3 kg)    Goal Weight: Short Term  175 lb (79.4 kg)    Goal Weight: Long Term  170 lb (77.1 kg)    Expected Outcomes  Short Term: Continue to assess and modify interventions until short term weight is achieved;Long Term: Adherence to nutrition and physical activity/exercise program aimed toward attainment of established weight goal;Weight Maintenance: Understanding of the daily nutrition guidelines, which includes 25-35% calories from fat, 7% or less cal from saturated fats, less than 274m cholesterol, less than 1.5gm of sodium, & 5 or more servings of fruits and vegetables daily;Weight Loss: Understanding of general recommendations for a balanced deficit meal plan, which promotes 1-2 lb weight loss per week and includes a negative energy balance of [phone] kcal/d;Understanding recommendations for meals to include 15-35% energy as protein, 25-35% energy from fat, 35-60% energy from carbohydrates, less than 2022mof dietary cholesterol, 20-35 gm of total fiber daily;Understanding of distribution of calorie intake throughout the day with the consumption of 4-5 meals/snacks    Improve shortness of breath with ADL's  Yes    Intervention  Provide education, individualized exercise plan and daily activity instruction to help decrease symptoms of SOB with activities of daily living.    Expected Outcomes  Short Term: Improve cardiorespiratory fitness to achieve a reduction of symptoms when performing ADLs;Long Term: Be able to perform more ADLs without symptoms or delay the onset of symptoms     Heart Failure  Yes   has angina   Intervention  Provide a combined exercise and nutrition program that is supplemented with education, support and counseling about heart failure. Directed toward relieving symptoms such as shortness of breath, decreased exercise tolerance, and extremity edema.    Expected Outcomes  Improve functional capacity of life;Short term: Attendance in program 2-3 days a week with increased exercise capacity. Reported lower sodium intake. Reported increased fruit and vegetable intake. Reports medication compliance.;Short term: Daily weights obtained and reported for increase. Utilizing diuretic protocols set by physician.;Long term: Adoption of self-care skills and reduction of barriers for early signs and symptoms recognition and intervention leading to self-care maintenance.    Hypertension  Yes    Intervention  Provide education on lifestyle  modifcations including regular physical activity/exercise, weight management, moderate sodium restriction and increased consumption of fresh fruit, vegetables, and low fat dairy, alcohol moderation, and smoking cessation.;Monitor prescription use compliance.    Expected Outcomes  Short Term: Continued assessment and intervention until BP is < 140/46m HG in hypertensive participants. < 130/814mHG in hypertensive participants with diabetes, heart failure or chronic kidney disease.;Long Term: Maintenance of blood pressure at goal levels.    Lipids  Yes    Intervention  Provide education and support for participant on nutrition & aerobic/resistive exercise along with prescribed medications to achieve LDL <7061mHDL >52m59m  Expected Outcomes  Short Term: Participant states understanding of desired cholesterol values and is compliant with medications prescribed. Participant is following exercise prescription and nutrition guidelines.;Long Term: Cholesterol controlled with medications as prescribed, with individualized exercise RX and with  personalized nutrition plan. Value goals: LDL < 70mg31mL > 40 mg.       Core Components/Risk Factors/Patient Goals Review:  Goals and Risk Factor Review    Row Name 12/13/17 1031 01/07/18 1023 02/07/18 1628         Core Components/Risk Factors/Patient Goals Review   Personal Goals Review  Weight Management/Obesity;Improve shortness of breath with ADL's;Heart Failure;Lipids;Develop more efficient breathing techniques such as purse lipped breathing and diaphragmatic breathing and practicing self-pacing with activity.;Hypertension  Weight Management/Obesity;Improve shortness of breath with ADL's;Heart Failure;Lipids;Hypertension  Weight Management/Obesity;Improve shortness of breath with ADL's;Heart Failure;Lipids;Hypertension     Review  Pulminologist recommended Mckenna Pheonix 10lbs to help his breathing and he has already lost 4. He weighs himself almost every day and is conisitand with taking all meds. His BP and lipids are repored to be under control. We uses [facility] and hopes this program will help improve his SOB.   [place] pulmonologist wants him to be 150 pounds to help with his breathing and be at a healthy weight. He wants to lose about 5 more pounds. His blood pressure has been good but he says when he wakes up in the morning his blood pressure is low and is configuring his medications. His lipid ratio was around 200. His HDL was a little high. He takes Reggies rice for his lipids. His ADLS are good at home except for walking up stairs and incline. He feels like he is improving.   Beau Eliaes that he has seen an improvement with his shortness of breath. He states that his goals are the same as last time except that he wants to increase his stamina more.      Expected Outcomes  Short: goal weight of 150 lbs. Long: Impove MET level and SOB.   Short: lose 3-5 more pounds, increase incline on the treadmill to help with ADL's. Long: meet weight goal and focuse on blood pressure next.  Short: Attend [name]  regularly to improve shortness of breath with ADL's. Long: maintain independence with ADL's         Core Components/Risk Factors/Patient Goals at Discharge (Final Review):  Goals and Risk Factor Review - 02/07/18 1628      Core Components/Risk Factors/Patient Goals Review   Personal Goals Review  Weight Management/Obesity;Improve shortness of breath with ADL's;Heart Failure;Lipids;Hypertension    Review  Kenric Markalees that he has seen an improvement with his shortness of breath. He states that his goals are the same as last time except that he wants to increase his stamina more.     Expected Outcomes  Short: Attend [name] regularly to improve shortness of breath  with ADL's. Long: maintain independence with ADL's        ITP Comments: ITP Comments    Row Name 11/30/17 1041 12/17/17 1035 12/27/17 0823 01/24/18 0949 02/21/18 0853   ITP Comments  Medical Evaluation completed. Chart sent for review and changes to Dr. Emily Filbert Director of [place]. Diagnosis can be found in [facility] encounter 11/23/16  Benoit called to let us know he had an accident.  Upon returning his call and reviewing his chart, he fell waking the dog at home and cut both hands and broke a bone in his face.  He is awaiting outpt f/u with maxiofacial specialist for possible surgery.  He is going to be out for at least two weeks and will let us know what the surgeon decides.    30 day review completed. ITP sent to Dr. Emily Filbert Director of [place]. Continue with ITP unless changes are made by physician   30 day review completed. ITP sent to Dr. Emily Filbert Director of [place]. Continue with ITP unless changes are made by physician  30 day review completed. ITP sent to Dr. Emily Filbert Director of [place]. Continue with ITP unless changes are made by physician   Row Name 03/10/18 1246           ITP Comments  Aariv left a message yesterday requesting to discharge from program.  Called him back today. He now has a catheter in place  prior to his surgery and would like to go ahead and discharge at this time.  Discharge ITP created and sent to Dr. Caryl Comes (covering for Dr. [name] Heck) for review and signature.  Discharge  summary sent to PCP and pulmonologist.           Comments: Discharge ITP

## 2018-03-10 NOTE — Telephone Encounter (Signed)
Caleb Khan left a message yesterday requesting to discharge from program.  Called him back today. He now has a catheter in place prior to his surgery and would like to go ahead and discharge at this time.

## 2018-03-10 NOTE — Progress Notes (Signed)
Discharge Progress Report  Patient Details  Name: Caleb Khan MRN: [id number] Date of Birth: 01-11-1928 Referring Provider:     Pulmonary Rehab from 11/30/2017 in [facility]  Referring Provider  [name]       Number of Visits: 90  Reason for Discharge:  Patient reached a stable level of exercise. Patient independent in their exercise. Patient has met program and personal goals. Early Exit:  Medical  Smoking History:  Social History   Tobacco Use  Smoking Status Former Smoker  . Packs/day: 2.00  . Years: 22.00  . Pack years: 44.00  . Types: Cigarettes  . Last attempt to quit: 05/22/1972  . Years since quitting: 45.8  Smokeless Tobacco Never Used    Diagnosis:  Chronic obstructive pulmonary disease, unspecified COPD type ([place])  ADL UCSD: Pulmonary Assessment Scores    Row Name 11/30/17 1106 03/04/18 1159       ADL UCSD   ADL Phase  Entry  Exit    SOB Score total  33  39    Rest  0  0    Walk  2  3    Stairs  4  5    Bath  0  0    Dress  1  1    Shop  2  2      CAT Score   CAT Score  17  15      mMRC Score   mMRC Score  1  1       Initial Exercise Prescription: Initial Exercise Prescription - 11/30/17 1200      Date of Initial Exercise RX and Referring Provider   Date  11/30/17    Referring Provider  [name]      Treadmill   MPH  2    Grade  0.5    Minutes  15    METs  2.6      Recumbant Bike   Level  2    RPM  60    Watts  10    Minutes  15    METs  2.4      REL-XR   Level  2    Speed  50    Minutes  15    METs  2.4      T5 Nustep   Level  2    SPM  80    Minutes  15    METs  2.4      Prescription Details   Frequency (times per week)  3    Duration  Progress to 45 minutes of aerobic exercise without signs/symptoms of physical distress      Intensity   THRR 40-80% of Max Heartrate  92-119    Ratings of Perceived Exertion  11-15    Perceived Dyspnea  0-4      Resistance Training   Training  Prescription  Yes    Weight  4 lb    Reps  10-15       Discharge Exercise Prescription (Final Exercise Prescription Changes): Exercise Prescription Changes - 02/24/18 1000      Response to Exercise   Blood Pressure (Admit)  114/56    Blood Pressure (Exit)  104/50    Heart Rate (Admit)  66 bpm    Heart Rate (Exercise)  112 bpm    Heart Rate (Exit)  64 bpm    Oxygen Saturation (Admit)  96 %    Oxygen Saturation (Exercise)  93 %  Oxygen Saturation (Exit)  97 %    Rating of Perceived Exertion (Exercise)  14    Perceived Dyspnea (Exercise)  2    Symptoms  none    Duration  Continue with 45 min of aerobic exercise without signs/symptoms of physical distress.    Intensity  THRR unchanged      Progression   Progression  Continue to progress workloads to maintain intensity without signs/symptoms of physical distress.    Average METs  3.23      Resistance Training   Training Prescription  Yes    Weight  4 lb    Reps  10-15      Interval Training   Interval Training  No      Treadmill   MPH  2.5    Grade  3    Minutes  15    METs  3.95      Recumbant Bike   Level  6    RPM  60    Minutes  15    METs  3.72      T5 Nustep   Level  5    Minutes  15    METs  2.3      Home Exercise Plan   Plans to continue exercise at  [employer] (comment)   [place]   Frequency  Add 2 additional days to program exercise sessions.    Initial Home Exercises Provided  12/29/17       Functional Capacity: 6 Minute Walk    Row Name 11/30/17 1229 03/04/18 1038       6 Minute Walk   Phase  -  Discharge    Distance  1360 feet  1430 feet    Distance % Change  -  5 %    Distance Feet Change  -  70 ft    Walk Time  6 minutes  6 minutes    # of Rest Breaks  0  0    MPH  2.58  2.7    METS  2.19  3.1    RPE  13  15    Perceived Dyspnea   2  3    VO2 Peak  7.66  10.7    Symptoms  No  Yes (comment)    Comments  -  Pt recently dx with spinal stenosis - has injection next week  - this affects his gait more than breathing    Resting HR  64 bpm  70 bpm    Resting BP  120/54  122/62    Resting Oxygen Saturation   97 %  97 %    Exercise Oxygen Saturation  during 6 min walk  94 %  97 %    Max Ex. HR  108 bpm  76 bpm    Max Ex. BP  132/58  126/62    2 Minute Post BP  126/58  -      Interval HR   1 Minute HR  70  -    2 Minute HR  78  -    3 Minute HR  77  -    4 Minute HR  92  -    5 Minute HR  108  -    6 Minute HR  103  -    2 Minute Post HR  80  -    Interval Heart Rate?  Yes  -      Interval Oxygen   Interval Oxygen?  Yes  -    Baseline Oxygen Saturation %  97 %  -    1 Minute Oxygen Saturation %  95 %  -    1 Minute Liters of Oxygen  0 L  -    2 Minute Oxygen Saturation %  94 %  -    2 Minute Liters of Oxygen  0 L  -    3 Minute Oxygen Saturation %  94 %  -    3 Minute Liters of Oxygen  0 L  -    4 Minute Oxygen Saturation %  94 %  -    4 Minute Liters of Oxygen  0 L  -    5 Minute Oxygen Saturation %  94 %  -    5 Minute Liters of Oxygen  0 L  -    6 Minute Oxygen Saturation %  95 %  -    6 Minute Liters of Oxygen  0 L  -    2 Minute Post Oxygen Saturation %  97 %  -    2 Minute Post Liters of Oxygen  0 L  -       Psychological, QOL, Others - Outcomes: PHQ 2/9: Depression screen [facility] 2/9 03/04/2018 11/30/2017  Decreased Interest 1 0  Down, Depressed, Hopeless 0 0  PHQ - 2 Score 1 0  Altered sleeping 0 0  Tired, decreased energy 1 1  Change in appetite 0 0  Feeling bad or failure about yourself  0 1  Trouble concentrating 0 0  Moving slowly or fidgety/restless 0 0  Suicidal thoughts 0 0  PHQ-9 Score 2 2  Difficult doing work/chores Not difficult at all Somewhat difficult    Quality of Life:   Personal Goals: Goals established at orientation with interventions provided to work toward goal. Personal Goals and Risk Factors at Admission - 11/30/17 1122      Core Components/Risk Factors/Patient Goals on Admission    Weight Management   Yes;Weight Loss;Weight Maintenance    Intervention  Weight Management: Develop a combined nutrition and exercise program designed to reach desired caloric intake, while maintaining appropriate intake of nutrient and fiber, sodium and fats, and appropriate energy expenditure required for the weight goal.;Weight Management/Obesity: Establish reasonable short term and long term weight goals.;Weight Management: Provide education and appropriate resources to help participant work on and attain dietary goals.    Admit Weight  161 lb 11.2 oz (73.3 kg)    Goal Weight: Short Term  175 lb (79.4 kg)    Goal Weight: Long Term  170 lb (77.1 kg)    Expected Outcomes  Short Term: Continue to assess and modify interventions until short term weight is achieved;Long Term: Adherence to nutrition and physical activity/exercise program aimed toward attainment of established weight goal;Weight Maintenance: Understanding of the daily nutrition guidelines, which includes 25-35% calories from fat, 7% or less cal from saturated fats, less than 248m cholesterol, less than 1.5gm of sodium, & 5 or more servings of fruits and vegetables daily;Weight Loss: Understanding of general recommendations for a balanced deficit meal plan, which promotes 1-2 lb weight loss per week and includes a negative energy balance of [phone] kcal/d;Understanding recommendations for meals to include 15-35% energy as protein, 25-35% energy from fat, 35-60% energy from carbohydrates, less than 2053mof dietary cholesterol, 20-35 gm of total fiber daily;Understanding of distribution of calorie intake throughout the day with the consumption of 4-5 meals/snacks    Improve shortness of breath  with ADL's  Yes    Intervention  Provide education, individualized exercise plan and daily activity instruction to help decrease symptoms of SOB with activities of daily living.    Expected Outcomes  Short Term: Improve cardiorespiratory fitness to achieve a reduction of  symptoms when performing ADLs;Long Term: Be able to perform more ADLs without symptoms or delay the onset of symptoms    Heart Failure  Yes   has angina   Intervention  Provide a combined exercise and nutrition program that is supplemented with education, support and counseling about heart failure. Directed toward relieving symptoms such as shortness of breath, decreased exercise tolerance, and extremity edema.    Expected Outcomes  Improve functional capacity of life;Short term: Attendance in program 2-3 days a week with increased exercise capacity. Reported lower sodium intake. Reported increased fruit and vegetable intake. Reports medication compliance.;Short term: Daily weights obtained and reported for increase. Utilizing diuretic protocols set by physician.;Long term: Adoption of self-care skills and reduction of barriers for early signs and symptoms recognition and intervention leading to self-care maintenance.    Hypertension  Yes    Intervention  Provide education on lifestyle modifcations including regular physical activity/exercise, weight management, moderate sodium restriction and increased consumption of fresh fruit, vegetables, and low fat dairy, alcohol moderation, and smoking cessation.;Monitor prescription use compliance.    Expected Outcomes  Short Term: Continued assessment and intervention until BP is < 140/14m HG in hypertensive participants. < 130/817mHG in hypertensive participants with diabetes, heart failure or chronic kidney disease.;Long Term: Maintenance of blood pressure at goal levels.    Lipids  Yes    Intervention  Provide education and support for participant on nutrition & aerobic/resistive exercise along with prescribed medications to achieve LDL <703mHDL >63m39m  Expected Outcomes  Short Term: Participant states understanding of desired cholesterol values and is compliant with medications prescribed. Participant is following exercise prescription and nutrition  guidelines.;Long Term: Cholesterol controlled with medications as prescribed, with individualized exercise RX and with personalized nutrition plan. Value goals: LDL < 70mg48mL > 40 mg.        Personal Goals Discharge: Goals and Risk Factor Review    Row Name 12/13/17 1031 01/07/18 1023 02/07/18 1628         Core Components/Risk Factors/Patient Goals Review   Personal Goals Review  Weight Management/Obesity;Improve shortness of breath with ADL's;Heart Failure;Lipids;Develop more efficient breathing techniques such as purse lipped breathing and diaphragmatic breathing and practicing self-pacing with activity.;Hypertension  Weight Management/Obesity;Improve shortness of breath with ADL's;Heart Failure;Lipids;Hypertension  Weight Management/Obesity;Improve shortness of breath with ADL's;Heart Failure;Lipids;Hypertension     Review  Pulminologist recommended Fateh Trice 10lbs to help his breathing and he has already lost 4. He weighs himself almost every day and is conisitand with taking all meds. His BP and lipids are repored to be under control. We uses PBL and hopes this program will help improve his SOB.   [place] pulmonologist wants him to be 150 pounds to help with his breathing and be at a healthy weight. He wants to lose about 5 more pounds. His blood pressure has been good but he says when he wakes up in the morning his blood pressure is low and is configuring his medications. His lipid ratio was around 200. His HDL was a little high. He takes Reggies rice for his lipids. His ADLS are good at home except for walking up stairs and incline. He feels like he is improving.   Eryk Renaldoes that  he has seen an improvement with his shortness of breath. He states that his goals are the same as last time except that he wants to increase his stamina more.      Expected Outcomes  Short: goal weight of 150 lbs. Long: Impove MET level and SOB.   Short: lose 3-5 more pounds, increase incline on the treadmill to help  with ADL's. Long: meet weight goal and focuse on blood pressure next.  Short: Attend [name] regularly to improve shortness of breath with ADL's. Long: maintain independence with ADL's         Exercise Goals and Review: Exercise Goals    Row Name 11/30/17 1228             Exercise Goals   Increase Physical Activity  Yes       Intervention  Provide advice, education, support and counseling about physical activity/exercise needs.;Develop an individualized exercise prescription for aerobic and resistive training based on initial evaluation findings, risk stratification, comorbidities and participant's personal goals.       Expected Outcomes  Short Term: Attend rehab on a regular basis to increase amount of physical activity.;Long Term: Add in home exercise to make exercise part of routine and to increase amount of physical activity.;Long Term: Exercising regularly at least 3-5 days a week.       Increase Strength and Stamina  Yes       Intervention  Provide advice, education, support and counseling about physical activity/exercise needs.;Develop an individualized exercise prescription for aerobic and resistive training based on initial evaluation findings, risk stratification, comorbidities and participant's personal goals.       Expected Outcomes  Short Term: Increase workloads from initial exercise prescription for resistance, speed, and METs.;Short Term: Perform resistance training exercises routinely during rehab and add in resistance training at home;Long Term: Improve cardiorespiratory fitness, muscular endurance and strength as measured by increased METs and functional capacity (6MWT)       Able to understand and use rate of perceived exertion (RPE) scale  Yes       Intervention  Provide education and explanation on how to use RPE scale       Expected Outcomes  Short Term: Able to use RPE daily in rehab to express subjective intensity level;Long Term:  Able to use RPE to guide intensity  level when exercising independently       Able to understand and use Dyspnea scale  Yes       Intervention  Provide education and explanation on how to use Dyspnea scale       Expected Outcomes  Short Term: Able to use Dyspnea scale daily in rehab to express subjective sense of shortness of breath during exertion;Long Term: Able to use Dyspnea scale to guide intensity level when exercising independently       Knowledge and understanding of Target Heart Rate Range (THRR)  Yes       Intervention  Provide education and explanation of THRR including how the numbers were predicted and where they are located for reference       Expected Outcomes  Short Term: Able to state/look up THRR;Short Term: Able to use daily as guideline for intensity in rehab;Long Term: Able to use THRR to govern intensity when exercising independently       Able to check pulse independently  Yes       Intervention  Provide education and demonstration on how to check pulse in carotid and radial arteries.;Review the importance of being  able to check your own pulse for safety during independent exercise       Expected Outcomes  Short Term: Able to explain why pulse checking is important during independent exercise;Long Term: Able to check pulse independently and accurately       Understanding of Exercise Prescription  Yes       Intervention  Provide education, explanation, and written materials on patient's individual exercise prescription       Expected Outcomes  Short Term: Able to explain program exercise prescription;Long Term: Able to explain home exercise prescription to exercise independently          Nutrition & Weight - Outcomes: Pre Biometrics - 11/30/17 1228      Pre Biometrics   Height  5' 5.5" (1.664 m)    Weight  161 lb 11.2 oz (73.3 kg)    Waist Circumference  36.5 inches    Hip Circumference  38.5 inches    Waist to Hip Ratio  0.95 %    BMI (Calculated)  26.49      Post Biometrics - 03/04/18 1037        Post  Biometrics   Height  5' 5.5" (1.664 m)    Weight  154 lb (69.9 kg)    Waist Circumference  35 inches    Hip Circumference  38.5 inches    Waist to Hip Ratio  0.91 %    BMI (Calculated)  25.23       Nutrition: Nutrition Therapy & Goals - 11/30/17 1119      Personal Nutrition Goals   Comments  Lose a little weight, Learn how to eat healthier      Intervention Plan   Intervention  Prescribe, educate and counsel regarding individualized specific dietary modifications aiming towards targeted core components such as weight, hypertension, lipid management, diabetes, heart failure and other comorbidities.;Nutrition handout(s) given to patient.    Expected Outcomes  Short Term Goal: Understand basic principles of dietary content, such as calories, fat, sodium, cholesterol and nutrients.;Long Term Goal: Adherence to prescribed nutrition plan.       Nutrition Discharge: Nutrition Assessments - 03/04/18 1201      MEDFICTS Scores   Pre Score  49    Post Score  37    Score Difference  -12       Education Questionnaire Score: Knowledge Questionnaire Score - 03/04/18 1201      Knowledge Questionnaire Score   Pre Score  14/18    Post Score  15/18   reviewed with patient      Goals reviewed with patient; copy given to patient.

## 2018-03-17 ENCOUNTER — Other Ambulatory Visit: Payer: Self-pay

## 2018-03-17 ENCOUNTER — Inpatient Hospital Stay
Admission: EM | Admit: 2018-03-17 | Discharge: 2018-03-19 | DRG: 698 | Disposition: A | Discharge status: Home / Self Care | Payer: Medicare PPO | Attending: Internal Medicine | Admitting: Internal Medicine

## 2018-03-17 ENCOUNTER — Encounter: Payer: Self-pay | Admitting: Emergency Medicine

## 2018-03-17 DIAGNOSIS — Z79899 Other long term (current) drug therapy: Secondary | ICD-10-CM

## 2018-03-17 DIAGNOSIS — R310 Gross hematuria: Secondary | ICD-10-CM | POA: Diagnosis present

## 2018-03-17 DIAGNOSIS — Z7982 Long term (current) use of aspirin: Secondary | ICD-10-CM | POA: Diagnosis not present

## 2018-03-17 DIAGNOSIS — N390 Urinary tract infection, site not specified: Secondary | ICD-10-CM | POA: Diagnosis present

## 2018-03-17 DIAGNOSIS — A4151 Sepsis due to Escherichia coli [E. coli]: Secondary | ICD-10-CM | POA: Diagnosis present

## 2018-03-17 DIAGNOSIS — Z7952 Long term (current) use of systemic steroids: Secondary | ICD-10-CM

## 2018-03-17 DIAGNOSIS — I2510 Atherosclerotic heart disease of native coronary artery without angina pectoris: Secondary | ICD-10-CM | POA: Diagnosis present

## 2018-03-17 DIAGNOSIS — N183 Chronic kidney disease, stage 3 (moderate): Secondary | ICD-10-CM | POA: Diagnosis present

## 2018-03-17 DIAGNOSIS — Y846 Urinary catheterization as the cause of abnormal reaction of the patient, or of later complication, without mention of misadventure at the time of the procedure: Secondary | ICD-10-CM | POA: Diagnosis present

## 2018-03-17 DIAGNOSIS — A419 Sepsis, unspecified organism: Secondary | ICD-10-CM | POA: Diagnosis present

## 2018-03-17 DIAGNOSIS — I130 Hypertensive heart and chronic kidney disease with heart failure and stage 1 through stage 4 chronic kidney disease, or unspecified chronic kidney disease: Secondary | ICD-10-CM | POA: Diagnosis present

## 2018-03-17 DIAGNOSIS — M1990 Unspecified osteoarthritis, unspecified site: Secondary | ICD-10-CM | POA: Diagnosis present

## 2018-03-17 DIAGNOSIS — Z96611 Presence of right artificial shoulder joint: Secondary | ICD-10-CM | POA: Diagnosis present

## 2018-03-17 DIAGNOSIS — Z96662 Presence of left artificial ankle joint: Secondary | ICD-10-CM | POA: Diagnosis present

## 2018-03-17 DIAGNOSIS — I5032 Chronic diastolic (congestive) heart failure: Secondary | ICD-10-CM | POA: Diagnosis present

## 2018-03-17 DIAGNOSIS — T83511A Infection and inflammatory reaction due to indwelling urethral catheter, initial encounter: Secondary | ICD-10-CM

## 2018-03-17 DIAGNOSIS — Z87891 Personal history of nicotine dependence: Secondary | ICD-10-CM

## 2018-03-17 DIAGNOSIS — T83518A Infection and inflammatory reaction due to other urinary catheter, initial encounter: Principal | ICD-10-CM | POA: Diagnosis present

## 2018-03-17 DIAGNOSIS — N401 Enlarged prostate with lower urinary tract symptoms: Secondary | ICD-10-CM | POA: Diagnosis present

## 2018-03-17 DIAGNOSIS — R509 Fever, unspecified: Secondary | ICD-10-CM | POA: Diagnosis not present

## 2018-03-17 DIAGNOSIS — N3289 Other specified disorders of bladder: Secondary | ICD-10-CM | POA: Diagnosis present

## 2018-03-17 DIAGNOSIS — R338 Other retention of urine: Secondary | ICD-10-CM | POA: Diagnosis present

## 2018-03-17 LAB — CBC WITH DIFFERENTIAL/PLATELET
Basophils Absolute: 0.1 10*3/uL (ref 0–0.1)
Basophils Relative: 1 %
Eosinophils Absolute: 0.1 10*3/uL (ref 0–0.7)
Eosinophils Relative: 1 %
HCT: 38.9 % — ABNORMAL LOW (ref 40.0–52.0)
Hemoglobin: 13.3 g/dL (ref 13.0–18.0)
Lymphocytes Relative: 6 %
Lymphs Abs: 0.8 10*3/uL — ABNORMAL LOW (ref 1.0–3.6)
MCH: 32.1 pg (ref 26.0–34.0)
MCHC: 34.1 g/dL (ref 32.0–36.0)
MCV: 94.1 fL (ref 80.0–100.0)
Monocytes Absolute: 0.8 10*3/uL (ref 0.2–1.0)
Monocytes Relative: 5 %
Neutro Abs: 13.3 10*3/uL — ABNORMAL HIGH (ref 1.4–6.5)
Neutrophils Relative %: 87 %
Platelets: 267 10*3/uL (ref 150–440)
RBC: 4.14 MIL/uL — ABNORMAL LOW (ref 4.40–5.90)
RDW: 14.3 % (ref 11.5–14.5)
WBC: 15.2 10*3/uL — ABNORMAL HIGH (ref 3.8–10.6)

## 2018-03-17 LAB — URINALYSIS, COMPLETE (UACMP) WITH MICROSCOPIC
Bilirubin Urine: NEGATIVE
Glucose, UA: NEGATIVE mg/dL
Ketones, ur: NEGATIVE mg/dL
Nitrite: POSITIVE — AB
Protein, ur: 100 mg/dL — AB
RBC / HPF: >50 RBC/hpf — ABNORMAL HIGH (ref 0–5)
Specific Gravity, Urine: 1.006 (ref 1.005–1.030)
Squamous Epithelial / HPF: NONE SEEN (ref 0–5)
WBC, UA: >50 WBC/hpf — ABNORMAL HIGH (ref 0–5)
pH: 7 (ref 5.0–8.0)

## 2018-03-17 LAB — COMPREHENSIVE METABOLIC PANEL
ALT: 21 U/L (ref 0–44)
AST: 32 U/L (ref 15–41)
Albumin: 3.8 g/dL (ref 3.5–5.0)
Alkaline Phosphatase: 45 U/L (ref 38–126)
Anion gap: 8 (ref 5–15)
BUN: 24 mg/dL — ABNORMAL HIGH (ref 8–23)
CO2: 24 mmol/L (ref 22–32)
Calcium: 8.6 mg/dL — ABNORMAL LOW (ref 8.9–10.3)
Chloride: 101 mmol/L (ref 98–111)
Creatinine, Ser: 1.17 mg/dL (ref 0.61–1.24)
GFR calc Af Amer: >60 mL/min (ref >60)
GFR calc non Af Amer: 54 mL/min — ABNORMAL LOW (ref >60)
Glucose, Bld: 98 mg/dL (ref 70–99)
Potassium: 4 mmol/L (ref 3.5–5.1)
Sodium: 133 mmol/L — ABNORMAL LOW (ref 135–145)
Total Bilirubin: 0.9 mg/dL (ref 0.3–1.2)
Total Protein: 6.6 g/dL (ref 6.5–8.1)

## 2018-03-17 LAB — PROTIME-INR
INR: 1
Prothrombin Time: 13.1 seconds (ref 11.4–15.2)

## 2018-03-17 LAB — TSH: TSH: 4.666 u[IU]/mL — ABNORMAL HIGH (ref 0.350–4.500)

## 2018-03-17 LAB — TROPONIN I: Troponin I: <0.03 ng/mL (ref <0.03)

## 2018-03-17 LAB — LACTIC ACID, PLASMA: Lactic Acid, Venous: 1.7 mmol/L (ref 0.5–1.9)

## 2018-03-17 MED ORDER — CALCIUM CARBONATE ANTACID 500 MG PO CHEW
600.0000 mg | CHEWABLE_TABLET | Freq: Every day | ORAL | Status: DC
  Filled 2018-03-17 (×2): qty 3

## 2018-03-17 MED ORDER — TERAZOSIN HCL 5 MG PO CAPS
10.0000 mg | ORAL_CAPSULE | Freq: Every day | ORAL | Status: DC
  Filled 2018-03-17: qty 2

## 2018-03-17 MED ORDER — PHENAZOPYRIDINE HCL 200 MG PO TABS: Freq: Once | ORAL | Status: DC

## 2018-03-17 MED ORDER — AMLODIPINE BESYLATE 5 MG PO TABS
5.0000 mg | ORAL_TABLET | Freq: Two times a day (BID) | ORAL | Status: DC
  Administered 2018-03-17 – 2018-03-19 (×5): 5 mg via ORAL
  Filled 2018-03-17 (×5): qty 1

## 2018-03-17 MED ORDER — SODIUM CHLORIDE 0.9 % IV SOLN
INTRAVENOUS | Status: DC
  Administered 2018-03-17 – 2018-03-18 (×3): via INTRAVENOUS

## 2018-03-17 MED ORDER — NITROGLYCERIN 0.4 MG SL SUBL: 0.4000 mg | SUBLINGUAL_TABLET | SUBLINGUAL | Status: DC | PRN

## 2018-03-17 MED ORDER — MORPHINE SULFATE (PF) 2 MG/ML IV SOLN
1.0000 mg | INTRAVENOUS | Status: DC | PRN
  Administered 2018-03-17: 1 mg via INTRAVENOUS
  Filled 2018-03-17: qty 1

## 2018-03-17 MED ORDER — HYDROCODONE-ACETAMINOPHEN 5-325 MG PO TABS
1.0000 | ORAL_TABLET | Freq: Four times a day (QID) | ORAL | Status: DC | PRN
  Administered 2018-03-17 – 2018-03-18 (×2): 1 via ORAL
  Filled 2018-03-17: qty 2
  Filled 2018-03-17: qty 1

## 2018-03-17 MED ORDER — DOCUSATE SODIUM 100 MG PO CAPS
100.0000 mg | ORAL_CAPSULE | Freq: Two times a day (BID) | ORAL | Status: DC
  Administered 2018-03-17 – 2018-03-19 (×5): 100 mg via ORAL
  Filled 2018-03-17 (×5): qty 1

## 2018-03-17 MED ORDER — FINASTERIDE 5 MG PO TABS
5.0000 mg | ORAL_TABLET | Freq: Every day | ORAL | Status: DC
  Administered 2018-03-17 – 2018-03-19 (×3): 5 mg via ORAL
  Filled 2018-03-17 (×3): qty 1

## 2018-03-17 MED ORDER — SODIUM CHLORIDE 0.9 % IV SOLN
1.0000 g | INTRAVENOUS | Status: DC
  Administered 2018-03-18: 1 g via INTRAVENOUS
  Filled 2018-03-17: qty 10
  Filled 2018-03-17: qty 1

## 2018-03-17 MED ORDER — ACETAMINOPHEN 650 MG RE SUPP: 650.0000 mg | Freq: Four times a day (QID) | RECTAL | Status: DC | PRN

## 2018-03-17 MED ORDER — LOSARTAN POTASSIUM 25 MG PO TABS
25.0000 mg | ORAL_TABLET | Freq: Every day | ORAL | Status: DC
  Filled 2018-03-17: qty 1

## 2018-03-17 MED ORDER — CALCIUM CARBONATE-VITAMIN D 500-200 MG-UNIT PO TABS
1.0000 | ORAL_TABLET | Freq: Every day | ORAL | Status: DC
  Administered 2018-03-17 – 2018-03-19 (×3): 1 via ORAL
  Filled 2018-03-17 (×3): qty 1

## 2018-03-17 MED ORDER — ONDANSETRON HCL 4 MG PO TABS: 4.0000 mg | ORAL_TABLET | Freq: Four times a day (QID) | ORAL | Status: DC | PRN

## 2018-03-17 MED ORDER — TAMSULOSIN HCL 0.4 MG PO CAPS
0.4000 mg | ORAL_CAPSULE | Freq: Every day | ORAL | Status: DC
  Administered 2018-03-17 – 2018-03-19 (×3): 0.4 mg via ORAL
  Filled 2018-03-17 (×3): qty 1

## 2018-03-17 MED ORDER — ENOXAPARIN SODIUM 40 MG/0.4ML ~~LOC~~ SOLN: 40.0000 mg | SUBCUTANEOUS | Status: DC

## 2018-03-17 MED ORDER — PREDNISONE 10 MG PO TABS
10.0000 mg | ORAL_TABLET | Freq: Every day | ORAL | Status: DC
  Administered 2018-03-17 – 2018-03-19 (×3): 10 mg via ORAL
  Filled 2018-03-17 (×3): qty 1

## 2018-03-17 MED ORDER — ONDANSETRON HCL 4 MG/2ML IJ SOLN
4.0000 mg | Freq: Four times a day (QID) | INTRAMUSCULAR | Status: DC | PRN
  Administered 2018-03-17: 4 mg via INTRAVENOUS
  Filled 2018-03-17: qty 2

## 2018-03-17 MED ORDER — SODIUM CHLORIDE 0.9 % IV SOLN
1.0000 g | INTRAVENOUS | Status: DC
  Administered 2018-03-17: 1 g via INTRAVENOUS
  Filled 2018-03-17: qty 10

## 2018-03-17 MED ORDER — ASPIRIN EC 81 MG PO TBEC
81.0000 mg | DELAYED_RELEASE_TABLET | Freq: Every day | ORAL | Status: DC
  Filled 2018-03-17: qty 1

## 2018-03-17 MED ORDER — ACETAMINOPHEN 325 MG PO TABS
650.0000 mg | ORAL_TABLET | Freq: Four times a day (QID) | ORAL | Status: DC | PRN
  Administered 2018-03-18: 650 mg via ORAL
  Filled 2018-03-17: qty 2

## 2018-03-17 NOTE — ED Triage Notes (Signed)
Pt arrived from [facility] independent living with fever and hematuria. Pt had indwelling catheter removed on Friday and has been self cathing since. Pt last cathed at 0300 this AM with 300ml output. Last took tylenol at 0000.

## 2018-03-17 NOTE — NC FL2 (Signed)
[place] MEDICAID FL2 LEVEL OF CARE SCREENING TOOL     IDENTIFICATION  Patient Name: Caleb Khan Birthdate: 05/17/1928 Sex: male Admission Date (Current Location): 03/17/2018  [place]   Facility and Address:  [facility], [address], [place], [place] [address]      Provider Number: [id number]  Attending Physician Name and Address:  Alford HighlandWieting, Richard, MD  Relative Name and Phone Number:       Current Level of Care: Hospital Recommended Level of Care: Skilled Nursing Facility Prior Approval Number:    Date Approved/Denied:   PASRR Number: [id number])  Discharge Plan: SNF    Current Diagnoses: Patient Active Problem List   Diagnosis Date Noted  . Sepsis (HCC) 03/17/2018    Orientation RESPIRATION BLADDER Height & Weight     Self, Time, Situation, Place  Normal Continent Weight: 154 lb 1.6 oz (69.9 kg) Height:     BEHAVIORAL SYMPTOMS/MOOD NEUROLOGICAL BOWEL NUTRITION STATUS      Continent Diet(Diet: Heart Healthy )  AMBULATORY STATUS COMMUNICATION OF NEEDS Skin   Extensive Assist Verbally Normal                       Personal Care Assistance Level of Assistance  Bathing, Feeding, Dressing Bathing Assistance: Limited assistance Feeding assistance: Independent Dressing Assistance: Limited assistance     Functional Limitations Info  Sight, Hearing, Speech Sight Info: Adequate Hearing Info: Adequate Speech Info: Adequate    SPECIAL CARE FACTORS FREQUENCY  PT (By licensed PT), OT (By licensed OT)     PT Frequency: (5) OT Frequency: (5)            Contractures      Additional Factors Info  Code Status, Allergies Code Status Info: (Full Code. ) Allergies Info: (Benazepril)           Current Medications (03/17/2018):  This is the current hospital active medication list Current Facility-Administered Medications  Medication Dose Route Frequency Provider Last Rate Last Dose  . 0.9  %  sodium chloride infusion   Intravenous Continuous Arnaldo Nataliamond, Michael S, MD 125 mL/hr at 03/17/18 [phone]    . acetaminophen (TYLENOL) tablet 650 mg  650 mg Oral Q6H PRN Arnaldo Nataliamond, Michael S, MD       Or  . acetaminophen (TYLENOL) suppository 650 mg  650 mg Rectal Q6H PRN Arnaldo Nataliamond, Michael S, MD      . amLODipine (NORVASC) tablet 5 mg  5 mg Oral BID Arnaldo Nataliamond, Michael S, MD      . aspirin EC tablet 81 mg  81 mg Oral Daily Arnaldo Nataliamond, Michael S, MD      . bupivacaine (MARCAINE) 0.5 % 15 mL, phenazopyridine (PYRIDIUM) 400 mg bladder mixture   Bladder Instillation Once Arnaldo Nataliamond, Michael S, MD      . calcium carbonate (TUMS - dosed in mg elemental calcium) chewable tablet 600 mg of elemental calcium  600 mg of elemental calcium Oral Daily Arnaldo Nataliamond, Michael S, MD      . calcium-vitamin D (OSCAL WITH D) 500-200 MG-UNIT per tablet 1 tablet  1 tablet Oral Daily Arnaldo Nataliamond, Michael S, MD      . cefTRIAXone (ROCEPHIN) 1 g in sodium chloride 0.9 % 100 mL IVPB  1 g Intravenous Q24H Arnaldo Nataliamond, Michael S, MD   Stopped at 03/17/18 0532  . docusate sodium (COLACE) capsule 100 mg  100 mg Oral BID Arnaldo Nataliamond, Michael S, MD      . enoxaparin (LOVENOX) injection 40  mg  40 mg Subcutaneous Q24H Arnaldo Natal, MD      . HYDROcodone-acetaminophen (NORCO/VICODIN) 5-325 MG per tablet 1-2 tablet  1-2 tablet Oral Q6H PRN Arnaldo Natal, MD      . losartan (COZAAR) tablet 25 mg  25 mg Oral Daily Arnaldo Natal, MD      . morphine 2 MG/ML injection 1 mg  1 mg Intravenous Q4H PRN Arnaldo Natal, MD   1 mg at 03/17/18 8119  . nitroGLYCERIN (NITROSTAT) SL tablet 0.4 mg  0.4 mg Sublingual Q5 min PRN Arnaldo Natal, MD      . ondansetron [facility]) tablet 4 mg  4 mg Oral Q6H PRN Arnaldo Natal, MD       Or  . ondansetron [facility]) injection 4 mg  4 mg Intravenous Q6H PRN Arnaldo Natal, MD   4 mg at 03/17/18 0915  . predniSONE (DELTASONE) tablet 10 mg  10 mg Oral Q breakfast Arnaldo Natal, MD   10 mg at 03/17/18 0915  .  terazosin (HYTRIN) capsule 10 mg  10 mg Oral QHS Arnaldo Natal, MD         Discharge Medications: Please see discharge summary for a list of discharge medications.  Relevant Imaging Results:  Relevant Lab Results:   Additional Information (SSN: [id number])  Dailyn Kempner, Darleen Crocker, LCSW

## 2018-03-17 NOTE — Progress Notes (Signed)
CODE SEPSIS - PHARMACY COMMUNICATION  **Broad Spectrum Antibiotics should be administered within 1 hour of Sepsis diagnosis**  Time Code Sepsis Called/Page Received: [id number]  Antibiotics Ordered: 0822 0424  Time of 1st antibiotic administration: 0822 0503  Additional action taken by pharmacy:   If necessary, Name of Provider/Nurse Contacted:     Erich MontaneMcBane,Kaulana Brindle S ,PharmD Clinical Pharmacist  03/17/2018  5:10 AM

## 2018-03-17 NOTE — Clinical Social Work Note (Signed)
Clinical Social Work Assessment  Patient Details  Name: Caleb Khan MRN: [id number] Date of Birth: 1928/06/14  Date of referral:  03/17/18               Reason for consult:  Discharge Planning                Permission sought to share information with:  Chartered certified accountant granted to share information::  Yes, Verbal Permission Granted  Name::      [facility]::     Relationship::     Contact Information:     Housing/Transportation Living arrangements for the past 2 months:  Charity fundraiser of Information:  Patient, Facility, Spouse Patient Interpreter Needed:  None Criminal Activity/Legal Involvement Pertinent to Current Situation/Hospitalization:  No - Comment as needed Significant Relationships:  Spouse Lives with:  Spouse Do you feel safe going back to the place where you live?  Yes Need for family participation in patient care:  Yes (Comment)  Care giving concerns:  Patient and his wife Caleb [facility] live at [facility].    Social Worker assessment / plan:  Holiday representative ([facility]aleb Khan) reviewed chart and noted that patient is from [facility]. Per [facility] admissions coordinator at [facility] patient is an independent living resident and can come to SNF at [facility] if needed. CSW met with patient and his wife was at bedside. Patient was alert and oriented X4 and was laying in the bed. CSW introduced self and explained role of CSW department. Per patient he prefers to go back to independent living and does not feel he will need the SNF building. CSW will continue to follow and assist as needed.   Employment status:  Retired Nurse, adult PT Recommendations:  Not assessed at this time Information / Referral to [facility] resources:  Other (Comment Required)(Patient prefers to D/C home. )  Patient/Family's Response to care:  Patient prefers to go back to [facility].   Patient/Family's Understanding of and Emotional Response to Diagnosis, Current Treatment, and Prognosis:  Patient was very pleasant and thanked CSW for assistance.   Emotional Assessment Appearance:  Appears stated age Attitude/Demeanor/Rapport:    Affect (typically observed):  Accepting, Adaptable, Pleasant Orientation:  Oriented to Self, Oriented to Place, Oriented to  Time, Oriented to Situation Alcohol / Substance use:  Not Applicable Psych involvement (Current and /or in the [facility]):  No (Comment)  Discharge Needs  Concerns to be addressed:  Discharge Planning Concerns Readmission within the last 30 days:  No Current discharge risk:  Dependent with Mobility Barriers to Discharge:  Continued Medical Work up   [employer], [facility]aleb Beets, LCSW 03/17/2018, 10:24 AM

## 2018-03-17 NOTE — ED Notes (Signed)
BLADDER SCAN= 170 ML

## 2018-03-17 NOTE — Evaluation (Signed)
Physical Therapy Evaluation Patient Details Name: Caleb Khan MRN: [id number] DOB: 1927-09-18 Today's Date: 03/17/2018   History of Present Illness  Pt presents to hospital on 03/17/18 complaining of a fever and hematuria. Pt subsequently diagnosed with a UTI and sepsis. Pts past medical history includes anemia, OA, anginal pain, CHF, CKD, and HTN    Clinical Impression  Pt is a pleasant 84 year old male who was admitted for sepsis and UTI. Pt performs bed mobility, transfers, and ambulation with mod I to CGA. Pt demonstrates deficits with balance and activity tolerance. Pt demonstrates WFL strength, and ROM of both upper and lower extremities. Pt amb 200' total, 100' of which with IV pole for assistance and 100' of which with no AD. Pt demonstrates improved stability with IV pole which suggest use of AD. Pt uses SPC in the mornings at baseline, encouraged pt to use SPC all the time at d/c from hospital. Pt instructed in there-ex to improve balance and tolerates well. Pt could benefit from continued skilled therapy at this time to improve deficits toward PLOF. PT will continue to work with pt at least 2x/ week while admitted. D/c recommendations at this time are home with outpatient PT.      Follow Up Recommendations Outpatient PT    Equipment Recommendations  None recommended by PT;Other (comment)(has all needed equipment)    Recommendations for Other Services       Precautions / Restrictions Precautions Precautions: None Restrictions Weight Bearing Restrictions: No      Mobility  Bed Mobility Overal bed mobility: Independent             General bed mobility comments: Pt moves supine <> sitting EOB independently without difficulty  Transfers Overall transfer level: Modified independent Equipment used: None             General transfer comment: Pt transfers sit<>stand without AD easily with appropriate UE use and no gross LOB  Ambulation/Gait Ambulation/Gait  assistance: Supervision;Min guard Gait Distance (Feet): 200 Feet Assistive device: IV Pole;None Gait Pattern/deviations: WFL(Within Functional Limits);Step-through pattern     General Gait Details: Pt amb 200' total, 100' with IV pole for stability with close supervision and 100 feet with no AD and CGA. Pt appears to demonstrate more steady gait using IV pole although pt had no LOB throughout. Pt states that he feels "a little more" fatigued than usual  Stairs            Wheelchair Mobility    Modified Rankin (Stroke Patients Only)       Balance Overall balance assessment: Needs assistance   Sitting balance-Leahy Scale: Good Sitting balance - Comments: Pt sits EOB without UE support while reaching outside BOS without LOB     Standing balance-Leahy Scale: Good Standing balance comment: Pt can stand without assistance with narrow BOS without LOB and amb without AD and no LOB however gait appears more steady with AD                             Pertinent Vitals/Pain Pain Assessment: 0-10 Pain Score: 2  Pain Location: pelvis Pain Descriptors / Indicators: Aching;Discomfort Pain Intervention(s): Limited activity within patient's tolerance;Monitored during session    Home Living Family/patient expects to be discharged to:: Private residence Living Arrangements: Spouse/significant other Available Help at Discharge: Family Type of Home: Independent living facility([facility]) Home Access: Level entry     Home Layout: Two level Home Equipment: Environmental consultant -  2 wheels;Cane - single point      Prior Function Level of Independence: Independent         Comments: Pt states that he was independent in the community without an AD and enjoys playing golf. Pt states that he uses a SPC when he first wakes up in the morning due to "stiffness" however does not usually use one     Hand Dominance        Extremity/Trunk Assessment   Upper Extremity Assessment Upper  Extremity Assessment: Overall WFL for tasks assessed(Grossly at least 4/5)    Lower Extremity Assessment Lower Extremity Assessment: Overall WFL for tasks assessed(Grossly at least 4/5)       Communication   Communication: No difficulties  Cognition Arousal/Alertness: Awake/alert Behavior During Therapy: WFL for tasks assessed/performed Overall Cognitive Status: Within Functional Limits for tasks assessed                                        General Comments      Exercises Other Exercises Other Exercises: Pt instructed in standing balance exercises including standing in tandem stance and rhomberg stance for 10 seconds x 3 each as well as standing reaching outside BOS multidirectionally 2x10 reps   Assessment/Plan    PT Assessment Patient needs continued PT services  PT Problem List Decreased balance;Decreased mobility       PT Treatment Interventions DME instruction;Gait training;Stair training;Functional mobility training;Therapeutic activities;Therapeutic exercise;Balance training;Patient/family education    PT Goals (Current goals can be found in the Care Plan section)  Acute Rehab PT Goals Patient Stated Goal: to stop hurting so much PT Goal Formulation: With patient Time For Goal Achievement: 03/31/18 Potential to Achieve Goals: Good    Frequency Min 2X/week   Barriers to discharge        Co-evaluation               AM-PAC PT "6 Clicks" Daily Activity  Outcome Measure Difficulty turning over in bed (including adjusting bedclothes, sheets and blankets)?: None Difficulty moving from lying on back to sitting on the side of the bed? : None Difficulty sitting down on and standing up from a chair with arms (e.g., wheelchair, bedside commode, etc,.)?: A Little Help needed moving to and from a bed to chair (including a wheelchair)?: None Help needed walking in hospital room?: None Help needed climbing 3-5 steps with a railing? : A Little 6  Click Score: 22    End of Session Equipment Utilized During Treatment: Gait belt Activity Tolerance: Patient tolerated treatment well Patient left: in bed;with call bell/phone within reach;with bed alarm set Nurse Communication: Mobility status PT Visit Diagnosis: Unsteadiness on feet (R26.81);Other abnormalities of gait and mobility (R26.89);Difficulty in walking, not elsewhere classified (R26.2);Pain Pain - Right/Left: (central pelvis) Pain - part of body: (pelvis)    Time: [id number] PT Time Calculation (min) (ACUTE ONLY): 27 min   Charges:              Ron AgeeLogan Deontrae Drinkard, SPT   Madelline Eshbach 03/17/2018, 4:50 PM

## 2018-03-17 NOTE — Progress Notes (Signed)
Patient ID: Caleb Khan, male   DOB: [birth year], 84 y.o.   MRN: [id number]  ACP note  Patient and wife at the bedside  Diagnosis: Clinical sepsis, catheter related urinary tract infection, CAD, hypertension, BPH, diastolic congestive heart failure, arthritis.  Patient is a full code  Plan.  Since the patient has a catheter in with urinary retention I switched the patient's alpha-blocker over to Flomax.  I will add Proscar.  Case discussed with Dr. Sheppard PentonWolf urology and he will need a catheter or intermittent caths long-term since he failed 2 voiding trials.  Will need to stay until urine culture results are back.  Decrease rate of IV fluids.  Time spent on ACP discussion 25 minutes Dr. Alford Highlandichard Authur Cubit

## 2018-03-17 NOTE — ED Provider Notes (Signed)
[facility] Emergency Department Provider Note    First MD Initiated Contact with Patient 03/17/18 [phone]     (approximate)  I have reviewed the triage vital signs and the nursing notes.   HISTORY  Chief Complaint Fever    HPI Caleb Khan is a 84 y.o. male with below list of chronic medical conditions including recently placed Foley catheter and subsequent removal, patient self caths at home presents to the emergency department via EMS secondary to with fever and hematuria this morning patient states last self-catheterization was at 3 AM this morning with 300 mL's of urine output at that time.  Patient states that he took Tylenol at 12:00.  On EMS arrival patient's temperature was 101.  Patient denies any nausea or vomiting.  Patient denies any diarrhea.  Patient does admit to pelvic discomfort.  Past Medical History:  Diagnosis Date  . Anemia   . Anginal pain (HCC)   . Arthritis    osteoarthritis  . CHF (congestive heart failure) (HCC)   . Chronic kidney disease   . Coronary artery disease   . Dyspnea    with exertion  . Hypertension     Patient Active Problem List   Diagnosis Date Noted  . Sepsis (HCC) 03/17/2018    Past Surgical History:  Procedure Laterality Date  . CARDIAC CATHETERIZATION Left 05/22/2016   Procedure: Left Heart Cath and Coronary Angiography;  Surgeon: Alwyn Peawayne D Callwood, MD;  Location: [facility] INVASIVE CV LAB;  Service: Cardiovascular;  Laterality: Left;  . EYE SURGERY Bilateral    Cataract Extraction with IOL  . JOINT REPLACEMENT Right 2004   Shoulder Replacement  . JOINT REPLACEMENT Left 2007   Ankle Replacement  . PATELLAR TENDON REPAIR Left 07/15/2017   Procedure: PATELLA TENDON REPAIR;  Surgeon: Kennedy BuckerMenz, Michael, MD;  Location: [facility] ORS;  Service: Orthopedics;  Laterality: Left;  Marland Kitchen. QUADRICEPS TENDON REPAIR Left 07/15/2017   Procedure: REPAIR QUADRICEP TENDON;  Surgeon: Kennedy BuckerMenz, Michael, MD;  Location: [facility] ORS;  Service:  Orthopedics;  Laterality: Left;  . TONSILLECTOMY      Prior to Admission medications   Medication Sig Start Date End Date Taking? Authorizing Provider  amLODipine (NORVASC) 5 MG tablet Take 5 mg by mouth 2 (two) times daily.    Yes [provider]  calcium carbonate (CALCIUM 600) 1500 (600 Ca) MG TABS tablet Take 600 mg of elemental calcium by mouth daily.   Yes [provider]  docusate sodium (COLACE) 100 MG capsule Take 200 mg by mouth daily as needed for mild constipation.   Yes [provider]  Multiple Vitamins-Minerals (MULTIVITAMIN PO) Take 1 tablet by mouth daily.   Yes [provider]  nitroGLYCERIN (NITROSTAT) 0.4 MG SL tablet Place 0.4 mg under the tongue every 5 (five) minutes as needed for chest pain.   Yes [provider]  predniSONE (DELTASONE) 10 MG tablet Take 10 mg by mouth daily with breakfast.   Yes [provider]  Red Yeast Rice Extract (RED YEAST RICE PO) Take 1 capsule by mouth 2 (two) times daily.    Yes [provider]  aspirin EC 81 MG tablet Take 81 mg by mouth daily.    [provider]  CALCIUM-VITAMIN D PO Take 1 tablet by mouth daily.    [provider]  HYDROcodone-acetaminophen (NORCO) 5-325 MG tablet Take 1-2 tablets by mouth every 6 (six) hours as needed for moderate pain. Patient not taking: Reported on 03/17/2018 07/15/17   Kennedy BuckerMenz, Michael,  MD  losartan (COZAAR) 25 MG tablet Take 25 mg by mouth daily.    [provider]  terazosin (HYTRIN) 10 MG capsule Take 10 mg by mouth at bedtime.    [provider]    Allergies Benazepril  Family History  Problem Relation Age of Onset  . Hypertension Mother     Social History Social History   Tobacco Use  . Smoking status: Former Smoker    Packs/day: 2.00    Years: 22.00    Pack years: 44.00    Types: Cigarettes    Last attempt to quit: 05/22/1972    Years since quitting: 45.8  . Smokeless tobacco: Never  Used  Substance Use Topics  . Alcohol use: Yes    Alcohol/week: 1.0 - 2.0 standard drinks    Types: 1 - 2 Glasses of wine per week    Comment: daily  . Drug use: No    Review of Systems Constitutional: Positive for fever Eyes: No visual changes. ENT: No sore throat. Cardiovascular: Denies chest pain. Respiratory: Denies shortness of breath. Gastrointestinal: No abdominal pain.  No nausea, no vomiting.  No diarrhea.  No constipation. Genitourinary: Negative for dysuria. Musculoskeletal: Negative for neck pain.  Negative for back pain. Integumentary: Negative for rash. Neurological: Negative for headaches, focal weakness or numbness.   ____________________________________________   PHYSICAL EXAM:  VITAL SIGNS: ED Triage Vitals  Enc Vitals Group     BP 03/17/18 0430 (!) 163/87     Pulse Rate 03/17/18 0420 90     Resp 03/17/18 0420 18     Temp 03/17/18 0420 98.8 F (37.1 C)     Temp Source 03/17/18 0420 Oral     SpO2 03/17/18 0420 97 %     Weight 03/17/18 0419 69.9 kg (154 lb 1.6 oz)     Height --      Head Circumference --      Peak Flow --      Pain Score --      Pain Loc --      Pain Edu? --      Excl. in GC? --     Constitutional: Alert and oriented. Well appearing and in no acute distress. Eyes: Conjunctivae are normal. Head: Atraumatic. Mouth/Throat: Mucous membranes are moist. Neck: No stridor.  No meningeal signs.   Cardiovascular: Normal rate, regular rhythm. Good peripheral circulation. Grossly normal heart sounds. Respiratory: Normal respiratory effort.  No retractions. Lungs CTAB. Gastrointestinal: Suprapubic tenderness to palpation.. No distention.  Musculoskeletal: No lower extremity tenderness nor edema. No gross deformities of extremities. Neurologic:  Normal speech and language. No gross focal neurologic deficits are appreciated.  Skin:  Skin is warm, dry and intact. No rash noted. Psychiatric: Mood and affect are normal. Speech and behavior are  normal.  ____________________________________________   LABS (all labs ordered are listed, but only abnormal results are displayed)  Labs Reviewed  COMPREHENSIVE METABOLIC PANEL - Abnormal; Notable for the following components:      Result Value   Sodium 133 (*)    BUN 24 (*)    Calcium 8.6 (*)    GFR calc non Af Amer 54 (*)    All other components within normal limits  CBC WITH DIFFERENTIAL/PLATELET - Abnormal; Notable for the following components:   WBC 15.2 (*)    RBC 4.14 (*)    HCT 38.9 (*)    Neutro Abs 13.3 (*)    Lymphs Abs 0.8 (*)    All other components within  normal limits  CULTURE, BLOOD (ROUTINE X 2)  CULTURE, BLOOD (ROUTINE X 2)  LACTIC ACID, PLASMA  PROTIME-INR  TROPONIN I  LACTIC ACID, PLASMA  URINALYSIS, COMPLETE (UACMP) WITH MICROSCOPIC     PROCEDURES  Critical Care performed:   .Critical Care Performed by: Darci Current, MD Authorized by: Darci Current, MD   Critical care provider statement:    Critical care time (minutes):  30   Critical care time was exclusive of:  Separately billable procedures and treating other patients   Critical care was necessary to treat or prevent imminent or life-threatening deterioration of the following conditions:  Sepsis   Critical care was time spent personally by me on the following activities:  Development of treatment plan with patient or surrogate, discussions with consultants, evaluation of patient's response to treatment, examination of patient, obtaining history from patient or surrogate, ordering and performing treatments and interventions, ordering and review of laboratory studies, ordering and review of radiographic studies, pulse oximetry, re-evaluation of patient's condition and review of old charts   I assumed direction of critical care for this patient from another provider in my specialty: no       ____________________________________________   INITIAL IMPRESSION / ASSESSMENT AND PLAN / ED  COURSE  As part of my medical decision making, I reviewed the following data within the electronic MEDICAL RECORD NUMBER  84 year old male presenting to the emergency department above-stated history and physical exam concerning for possible sepsis most likely secondary to urinary tract infection.  Since protocol initiated patient given appropriate IV antibiotics.  Laboratory data revealed leukocytosis patient no longer febrile    ____________________________________________  FINAL CLINICAL IMPRESSION(S) / ED DIAGNOSES  Final diagnoses:  Sepsis, due to unspecified organism [facility]  Urinary tract infection associated with catheterization of urinary tract, unspecified indwelling urinary catheter type, initial encounter (HCC)     MEDICATIONS GIVEN DURING THIS VISIT:  Medications  cefTRIAXone (ROCEPHIN) 1 g in sodium chloride 0.9 % 100 mL IVPB (1 g Intravenous New Bag/Given 03/17/18 0503)     ED Discharge Orders    None       Note:  This document was prepared using Dragon voice recognition software and may include unintentional dictation errors.    Darci Current, MD 03/17/18 [phone]

## 2018-03-17 NOTE — H&P (Signed)
Caleb Khan is an 84 y.o. male.   Chief Complaint: Fever HPI: The patient with past medical history of CAD, hypertension, CHF (resolved) and urinary obstruction presents to the emergency department with fever.  The patient has had intense bladder spasms this evening as well as a T-max of 101 F measured by EMS.  He was afebrile upon arrival to the emergency department but had grossly bloody urine when a Foley catheter was placed.  He continues to have pain.  The patient also met criteria for sepsis which prompted initiation of sepsis protocol by the emergency department before the treatment team called the hospitalist service for admission.  Past Medical History:  Diagnosis Date  . Anemia   . Anginal pain ([place])   . Arthritis    osteoarthritis  . CHF (congestive heart failure) ([place])   . Chronic kidney disease   . Coronary artery disease   . Dyspnea    with exertion  . Hypertension     Past Surgical History:  Procedure Laterality Date  . CARDIAC CATHETERIZATION Left 05/22/2016   Procedure: Left Heart Cath and Coronary Angiography;  Surgeon: Yolonda Kida, MD;  Location: [facility];  Service: Cardiovascular;  Laterality: Left;  . EYE SURGERY Bilateral    Cataract Extraction with IOL  . JOINT REPLACEMENT Right 2004   Shoulder Replacement  . JOINT REPLACEMENT Left 2007   Ankle Replacement  . PATELLAR TENDON REPAIR Left 07/15/2017   Procedure: PATELLA TENDON REPAIR;  Surgeon: Hessie Knows, MD;  Location: [facility];  Service: Orthopedics;  Laterality: Left;  Marland Kitchen QUADRICEPS TENDON REPAIR Left 07/15/2017   Procedure: REPAIR QUADRICEP TENDON;  Surgeon: Hessie Knows, MD;  Location: [facility];  Service: Orthopedics;  Laterality: Left;  . TONSILLECTOMY      Family History  Problem Relation Age of Onset  . Hypertension Mother    Social History:  reports that he quit smoking about 45 years ago. His smoking use included cigarettes. He has a 44.00 pack-year smoking history. He has  never used smokeless tobacco. He reports that he drinks about 1.0 - 2.0 standard drinks of alcohol per week. He reports that he does not use drugs.  Allergies:  Allergies  Allergen Reactions  . Benazepril Other (See Comments)    Dry cough    Medications Prior to Admission  Medication Sig Dispense Refill  . amLODipine (NORVASC) 5 MG tablet Take 5 mg by mouth 2 (two) times daily.     . calcium carbonate (CALCIUM 600) 1500 (600 Ca) MG TABS tablet Take 600 mg of elemental calcium by mouth daily.    Marland Kitchen docusate sodium (COLACE) 100 MG capsule Take 200 mg by mouth daily as needed for mild constipation.    . Multiple Vitamins-Minerals (MULTIVITAMIN PO) Take 1 tablet by mouth daily.    . nitroGLYCERIN (NITROSTAT) 0.4 MG SL tablet Place 0.4 mg under the tongue every 5 (five) minutes as needed for chest pain.    . predniSONE (DELTASONE) 10 MG tablet Take 10 mg by mouth daily with breakfast.    . Red Yeast Rice Extract (RED YEAST RICE PO) Take 1 capsule by mouth 2 (two) times daily.     Marland Kitchen aspirin EC 81 MG tablet Take 81 mg by mouth daily.    Marland Kitchen CALCIUM-VITAMIN D PO Take 1 tablet by mouth daily.    Marland Kitchen HYDROcodone-acetaminophen (NORCO) 5-325 MG tablet Take 1-2 tablets by mouth every 6 (six) hours as needed for moderate pain. (Patient not taking: Reported on 03/17/2018)  30 tablet 0  . losartan (COZAAR) 25 MG tablet Take 25 mg by mouth daily.    Marland Kitchen terazosin (HYTRIN) 10 MG capsule Take 10 mg by mouth at bedtime.      Results for orders placed or performed during the hospital encounter of 03/17/18 (from the past 48 hour(s))  Comprehensive metabolic panel     Status: Abnormal   Collection Time: 03/17/18  4:24 AM  Result Value Ref Range   Sodium 133 (L) 135 - 145 mmol/L   Potassium 4.0 3.5 - 5.1 mmol/L   Chloride 101 98 - 111 mmol/L   CO2 24 22 - 32 mmol/L   Glucose, Bld 98 70 - 99 mg/dL   BUN 24 (H) 8 - 23 mg/dL   Creatinine, Ser 1.17 0.61 - 1.24 mg/dL   Calcium 8.6 (L) 8.9 - 10.3 mg/dL   Total Protein  6.6 6.5 - 8.1 g/dL   Albumin 3.8 3.5 - 5.0 g/dL   AST 32 15 - 41 U/L   ALT 21 0 - 44 U/L   Alkaline Phosphatase 45 38 - 126 U/L   Total Bilirubin 0.9 0.3 - 1.2 mg/dL   GFR calc non Af Amer 54 (L) >60 mL/min   GFR calc Af Amer >60 >60 mL/min    Comment: (NOTE) The eGFR has been calculated using the CKD EPI equation. This calculation has not been validated in all clinical situations. eGFR's persistently <60 mL/min signify possible Chronic Kidney Disease.    Anion gap 8 5 - 15    Comment: Performed at [facility], [address] [place], [place] [address]  Lactic acid, plasma     Status: None   Collection Time: 03/17/18  4:24 AM  Result Value Ref Range   Lactic Acid, Venous 1.7 0.5 - 1.9 mmol/L    Comment: Performed at [facility], [address] [place], [place] [address]  CBC with Differential     Status: Abnormal   Collection Time: 03/17/18  4:24 AM  Result Value Ref Range   WBC 15.2 (H) 3.8 - 10.6 K/uL   RBC 4.14 (L) 4.40 - 5.90 MIL/uL   Hemoglobin 13.3 13.0 - 18.0 g/dL   HCT 38.9 (L) 40.0 - 52.0 %   MCV 94.1 80.0 - 100.0 fL   MCH 32.1 26.0 - 34.0 pg   MCHC 34.1 32.0 - 36.0 g/dL   RDW 14.3 11.5 - 14.5 %   Platelets 267 150 - 440 K/uL   Neutrophils Relative % 87 %   Neutro Abs 13.3 (H) 1.4 - 6.5 K/uL   Lymphocytes Relative 6 %   Lymphs Abs 0.8 (L) 1.0 - 3.6 K/uL   Monocytes Relative 5 %   Monocytes Absolute 0.8 0.2 - 1.0 K/uL   Eosinophils Relative 1 %   Eosinophils Absolute 0.1 0 - 0.7 K/uL   Basophils Relative 1 %   Basophils Absolute 0.1 0 - 0.1 K/uL    Comment: Performed at [facility], [address] [place], [place] [address]  Protime-INR     Status: None   Collection Time: 03/17/18  4:24 AM  Result Value Ref Range   Prothrombin Time 13.1 11.4 - 15.2 seconds   INR 1.00     Comment: Performed at [facility], [address], [place], [place] [address]  Troponin I     Status: None   Collection Time: 03/17/18  4:24 AM   Result Value Ref Range   Troponin I <0.03 <0.03 ng/mL  Comment: Performed at [facility] [address] [place], [place] [address]   No results found.  Review of Systems  Constitutional: Positive for fever. Negative for chills.  HENT: Negative for sore throat and tinnitus.   Eyes: Negative for blurred vision and redness.  Respiratory: Negative for cough and shortness of breath.   Cardiovascular: Negative for chest pain, palpitations, orthopnea and PND.  Gastrointestinal: Positive for nausea. Negative for abdominal pain, diarrhea and vomiting.  Genitourinary: Positive for dysuria and hematuria. Negative for frequency and urgency.  Musculoskeletal: Negative for joint pain and myalgias.  Skin: Negative for rash.       No lesions  Neurological: Negative for speech change, focal weakness and weakness.  Endo/Heme/Allergies: Does not bruise/bleed easily.       No temperature intolerance  Psychiatric/Behavioral: Negative for depression and suicidal ideas.    Blood pressure (!) 163/87, pulse 89, temperature 98.8 F (37.1 C), temperature source Oral, resp. rate (!) 21, weight 69.9 kg, SpO2 99 %. Physical Exam  Vitals reviewed. Constitutional: He is oriented to person, place, and time. He appears well-developed and well-nourished. No distress.  HENT:  Head: Normocephalic and atraumatic.  Mouth/Throat: Oropharynx is clear and moist.  Eyes: Pupils are equal, round, and reactive to light. Conjunctivae and EOM are normal. No scleral icterus.  Neck: Normal range of motion. Neck supple. No JVD present. No tracheal deviation present. No thyromegaly present.  Cardiovascular: Normal rate, regular rhythm and normal heart sounds. Exam reveals no gallop and no friction rub.  No murmur heard. Respiratory: Effort normal and breath sounds normal. No respiratory distress.  GI: Soft. Bowel sounds are normal. He exhibits no distension. There is no tenderness.  Genitourinary:  Genitourinary  Comments: Foley catheter in place; urine grossly bloody  Musculoskeletal: Normal range of motion. He exhibits no edema.  Lymphadenopathy:    He has no cervical adenopathy.  Neurological: He is alert and oriented to person, place, and time. No cranial nerve deficit.  Skin: Skin is warm and dry. No rash noted. No erythema.  Psychiatric: He has a normal mood and affect. His behavior is normal. Thought content normal.     Assessment/Plan This is an 84 year old male admitted for sepsis. 1.  Sepsis: The patient meets criteria via fever, tachypnea and leukocytosis.  He is hemodynamically stable.  Source likely urine.  Continue ceftriaxone.  Follow urine and blood cultures for growth and sensitivities.   2.  UTI: Present on admission; the patient self catheterizes.  Indwelling Foley now in place.  It is unclear if hematuria secondary to traumatic catheter placement or if present prior to hospitalization 3.  CAD: Stable; continue aspirin if materia determined to be secondary to traumatic Foley placement. 4.  Hypertension: Uncontrolled; secondary to pain and missed medication.  Continue losartan 5.  BPH: Continue terazosin 6.  Arthritis: Continue daily prednisone per home regimen 7.  CHF: Preserved ejection fraction.  No echo on file to demonstrate diastolic dysfunction.  The patient does not clinically have volume overload.  It appears this may have been a transient problem. 8.  DVT prophylaxis: Lovenox 9.  GI prophylaxis: None The patient is a full code.  Time spent on admission orders and patient care approximately 45 minutes  Harrie Foreman, MD 03/17/2018, 5:15 AM

## 2018-03-18 MED ORDER — BELLADONNA ALKALOIDS-OPIUM 16.2-60 MG RE SUPP: 1.0000 | Freq: Four times a day (QID) | RECTAL | Status: DC | PRN

## 2018-03-18 MED ORDER — SODIUM CHLORIDE 0.9 % IV SOLN
1.0000 g | Freq: Two times a day (BID) | INTRAVENOUS | Status: DC
  Administered 2018-03-18: 1 g via INTRAVENOUS
  Filled 2018-03-18 (×3): qty 1

## 2018-03-18 MED ORDER — SODIUM CHLORIDE 0.9 % IV SOLN
1.0000 g | Freq: Two times a day (BID) | INTRAVENOUS | Status: DC
  Administered 2018-03-19: 1 g via INTRAVENOUS
  Filled 2018-03-18 (×3): qty 1

## 2018-03-18 MED ORDER — SODIUM CHLORIDE 0.9 % IV SOLN: 1.0000 g | Freq: Two times a day (BID) | INTRAVENOUS | Status: DC

## 2018-03-18 MED ORDER — OXYBUTYNIN CHLORIDE 5 MG PO TABS
5.0000 mg | ORAL_TABLET | Freq: Three times a day (TID) | ORAL | Status: DC | PRN
  Filled 2018-03-18: qty 1

## 2018-03-18 NOTE — Progress Notes (Signed)
Patient ID: Caleb Khan, male   DOB: August 15, 1927, 84 y.o.   MRN: [id number]   [employer] PROGRESS NOTE  Caleb Khan WUJ:[id number] DOB: August 15, 1927 DOA: 03/17/2018 PCP: Marisue IvanLinthavong, Kanhka, MD  HPI/Subjective: Patient still having some lower abdominal discomfort.  Feels more comfortable if the Foley catheter stays in at this point.  Patient had some chills.  Objective: Vitals:   03/18/18 0828 03/18/18 1447  BP: (!) 138/57 (!) 170/83  Pulse: (!) 59 75  Resp: 18 16  Temp:  98 F (36.7 C)  SpO2: 97% 96%    Filed Weights   03/17/18 0419 03/18/18 0500  Weight: 69.9 kg 68 kg    ROS: Review of Systems  Constitutional: Positive for chills. Negative for fever.  Eyes: Negative for blurred vision.  Respiratory: Negative for cough and shortness of breath.   Cardiovascular: Negative for chest pain.  Gastrointestinal: Positive for abdominal pain. Negative for constipation, diarrhea, nausea and vomiting.  Genitourinary: Negative for dysuria.  Musculoskeletal: Negative for joint pain.  Neurological: Negative for dizziness and headaches.   Exam: Physical Exam  Constitutional: He is oriented to person, place, and time.  HENT:  Nose: No mucosal edema.  Mouth/Throat: No oropharyngeal exudate or posterior oropharyngeal edema.  Eyes: Pupils are equal, round, and reactive to light. Conjunctivae, EOM and lids are normal.  Neck: No JVD present. Carotid bruit is not present. No edema present. No thyroid mass and no thyromegaly present.  Cardiovascular: S1 normal and S2 normal. Exam reveals no gallop.  No murmur heard. Pulses:      Dorsalis pedis pulses are 2+ on the right side, and 2+ on the left side.  Respiratory: No respiratory distress. He has no wheezes. He has no rhonchi. He has no rales.  GI: Soft. Bowel sounds are normal. There is tenderness in the suprapubic area.  Musculoskeletal:       Right ankle: He exhibits no swelling.       Left ankle: He exhibits no swelling.   Lymphadenopathy:    He has no cervical adenopathy.  Neurological: He is alert and oriented to person, place, and time. No cranial nerve deficit.  Skin: Skin is warm. No rash noted. Nails show no clubbing.  Psychiatric: He has a normal mood and affect.      Data Reviewed: Basic Metabolic Panel: Recent Labs  Lab 03/17/18 0424  NA 133*  K 4.0  CL 101  CO2 24  GLUCOSE 98  BUN 24*  CREATININE 1.17  CALCIUM 8.6*   Liver Function Tests: Recent Labs  Lab 03/17/18 0424  AST 32  ALT 21  ALKPHOS 45  BILITOT 0.9  PROT 6.6  ALBUMIN 3.8   CBC: Recent Labs  Lab 03/17/18 0424  WBC 15.2*  NEUTROABS 13.3*  HGB 13.3  HCT 38.9*  MCV 94.1  PLT 267   Cardiac Enzymes: Recent Labs  Lab 03/17/18 0424  TROPONINI <0.03     Recent Results (from the past 240 hour(s))  Culture, blood (Routine x 2)     Status: None (Preliminary result)   Collection Time: 03/17/18  4:24 AM  Result Value Ref Range Status   Specimen Description BLOOD BLOOD RIGHT FOREARM  Final   Special Requests   Final    BOTTLES DRAWN AEROBIC AND ANAEROBIC Blood Culture adequate volume   Culture   Final    NO GROWTH 1 DAY Performed at [facility], [address] [place], [place] [address]    Report Status PENDING  Incomplete  Culture,  blood (Routine x 2)     Status: None (Preliminary result)   Collection Time: 03/17/18  4:24 AM  Result Value Ref Range Status   Specimen Description BLOOD LEFT ANTECUBITAL  Final   Special Requests   Final    BOTTLES DRAWN AEROBIC AND ANAEROBIC Blood Culture results may not be optimal due to an excessive volume of blood received in culture bottles   Culture   Final    NO GROWTH 1 DAY Performed at [facility], [address] [place], [place] [address]    Report Status PENDING  Incomplete     Scheduled Meds: . amLODipine  5 mg Oral BID  . bupivacaine-phenazopyridine (MARCAINE-PYRIDIUM) bladder mixture   Bladder Instillation Once  . calcium  carbonate  600 mg of elemental calcium Oral Daily  . calcium-vitamin D  1 tablet Oral Daily  . docusate sodium  100 mg Oral BID  . finasteride  5 mg Oral Daily  . predniSONE  10 mg Oral Q breakfast  . tamsulosin  0.4 mg Oral QPC breakfast   Continuous Infusions: . cefTRIAXone (ROCEPHIN)  IV 1 g (03/18/18 0948)    Assessment/Plan:  1. Clinical sepsis, catheter related urinary tract infection.  Would rather keep in the hospital until urine culture comes back.  I spoke with Dr. Evelene Croon yesterday and the patient will likely need intermittent catheterizations or Foley catheter until a procedure on his prostate is done.  Continue Flomax and Proscar.  Patient on Rocephin and I will switch to meropenem since patient still having symptoms.  Can consider a CAT scan of the abdomen if no further improvement in symptoms.  Order Ditropan for bladder spasms. 2. History of CAD.  Aspirin on hold with the initial hematuria.  Urine cleared so can consider restarting aspirin tomorrow. 3. History of diastolic congestive heart failure no signs of heart failure currently.  Discontinue IV fluids today. 4. Hypertension on Norvasc 5. Chronic kidney disease stage III  Code Status:     Code Status Orders  (From admission, onward)         Start     Ordered   03/17/18 0729  Full code  Continuous     03/17/18 0728        Code Status History    Date Active Date Inactive Code Status Order ID Comments User Context   05/22/2016 1548 05/22/2016 1849 Full Code [id number]  Alwyn Pea, MD Inpatient     Family Communication: Wife yesterday Disposition Plan: Home once urine culture back  Antibiotics:  Rocephin switched to meropenem  Time spent: 28 minutes  Nasha Diss [employer]

## 2018-03-18 NOTE — Plan of Care (Signed)
  Problem: Urinary Elimination: Goal: Signs and symptoms of infection will decrease Outcome: Progressing   Problem: Safety: Goal: Ability to remain free from injury will improve Outcome: Progressing   

## 2018-03-18 NOTE — Progress Notes (Signed)
PT is recommending outpatient PT. RN case manager aware of above. [facility] is aware of above. Please reconsult if future social work needs arise. CSW signing off.   Baker Hughes IncorporatedBailey Mykael Batz, LCSW [phone]

## 2018-03-18 NOTE — Progress Notes (Signed)
Patient called me to the room and said he is having chills, back pain same symptom he had when he came to the hospital, and to notified MD. Md was notified VSS taken , no new order received will continue to monitor .

## 2018-03-19 MED ORDER — OXYBUTYNIN CHLORIDE 5 MG PO TABS: 5.0000 mg | ORAL_TABLET | Freq: Three times a day (TID) | ORAL | 0 refills | Status: DC | PRN

## 2018-03-19 MED ORDER — FINASTERIDE 5 MG PO TABS: 5.0000 mg | ORAL_TABLET | Freq: Every day | ORAL | 2 refills | Status: DC

## 2018-03-19 MED ORDER — CEPHALEXIN 500 MG PO CAPS: 500.0000 mg | ORAL_CAPSULE | Freq: Three times a day (TID) | ORAL | 0 refills | Status: AC

## 2018-03-19 NOTE — Progress Notes (Signed)
Malachy MoodLeon Schmelzer to be D/C'd Home per MD order.  Discussed prescriptions and follow up appointments with the patient. Prescriptions electronically submitted, medication list explained in detail. Pt verbalized understanding.  Allergies as of 03/19/2018      Reactions   Benazepril Other (See Comments)   Dry cough      Medication List    STOP taking these medications   HYDROcodone-acetaminophen 5-325 MG tablet Commonly known as:  NORCO/VICODIN     TAKE these medications   amLODipine 5 MG tablet Commonly known as:  NORVASC Take 5 mg by mouth daily.   aspirin EC 81 MG tablet Take 81 mg by mouth daily.   CALCIUM-VITAMIN D PO Take 1 tablet by mouth daily.   cephALEXin 500 MG capsule Commonly known as:  KEFLEX Take 1 capsule (500 mg total) by mouth 3 (three) times daily for 9 days.   docusate sodium 100 MG capsule Commonly known as:  COLACE Take 200 mg by mouth daily as needed for mild constipation.   finasteride 5 MG tablet Commonly known as:  PROSCAR Take 1 tablet (5 mg total) by mouth daily. Start taking on:  03/20/2018   MULTIVITAMIN PO Take 1 tablet by mouth daily.   nitroGLYCERIN 0.4 MG SL tablet Commonly known as:  NITROSTAT Place 0.4 mg under the tongue every 5 (five) minutes as needed for chest pain.   oxybutynin 5 MG tablet Commonly known as:  DITROPAN Take 1 tablet (5 mg total) by mouth every 8 (eight) hours as needed for bladder spasms.   predniSONE 10 MG tablet Commonly known as:  DELTASONE Take 10 mg by mouth daily with breakfast.   RED YEAST RICE PO Take 1 capsule by mouth 2 (two) times daily.   tamsulosin 0.4 MG Caps capsule Commonly known as:  FLOMAX Take 0.4 mg by mouth daily after breakfast.       Vitals:   03/19/18 0007 03/19/18 0800  BP: (!) 149/69 (!) 160/65  Pulse: (!) 55 (!) 57  Resp:  16  Temp:  98 F (36.7 C)  SpO2:  99%    Skin clean, dry and intact without evidence of skin break down, no evidence of skin tears noted. IV  catheter's discontinued intact. Site without signs and symptoms of complications. Dressing and pressure applied. Pt denies pain at this time. No complaints noted. Foley catheter remained intact, drainage bag changed to leg bag.  An After Visit Summary was printed and given to the patient. Patient escorted via WC, and D/C home via private auto.  Taseen Marasigan Marylou FlesherM Joseff Luckman

## 2018-03-19 NOTE — Discharge Summary (Signed)
[facility] [place] at [facility]   PATIENT NAME: Caleb Khan    MR#:  [id number]  DATE OF BIRTH:  1928/06/20  DATE OF ADMISSION:  03/17/2018   ADMITTING PHYSICIAN: Arnaldo Natal, MD  DATE OF DISCHARGE:  03/19/18  PRIMARY CARE PHYSICIAN: Marisue Ivan, MD   ADMISSION DIAGNOSIS:   Sepsis, due to unspecified organism (HCC) [A41.9] Urinary tract infection associated with catheterization of urinary tract, unspecified indwelling urinary catheter type, initial encounter (HCC) [T83.511A, N39.0]  DISCHARGE DIAGNOSIS:   Active Problems:   Sepsis (HCC)   SECONDARY DIAGNOSIS:   Past Medical History:  Diagnosis Date  . Anemia   . Anginal pain (HCC)   . Arthritis    osteoarthritis  . CHF (congestive heart failure) (HCC)   . Chronic kidney disease   . Coronary artery disease   . Dyspnea    with exertion  . Hypertension     HOSPITAL COURSE:   84 year old male with past medical history significant for CAD, hypertension, CHF, BPH presents to hospital secondary to sepsis  1.  Clinical sepsis-secondary to urinary tract infections. -Blood cultures are negative, urine cultures growing E. coli-sensitivities pending -Was on meropenem, will be changed to oral keflex at discharge today. -No fevers in the last 24 hours.  2.  Urinary retention-patient had a Foley catheter due to bladder issues from prostrate as outpatient.  Failed voiding trial and is on intermittent catheterizations at home.  That could have been the reason for his UTI. -Case was discussed with his urologist by Dr. Renae Gloss on 03/18/18.  Plan to discharge with the Foley catheter and urology follow-up as outpatient. -on Flomax and Proscar  3.  Hypertension-continue Norvasc  Patient is ambulating well.  Will be discharged home today  DISCHARGE CONDITIONS:   Guarded  CONSULTS OBTAINED:    None  DRUG ALLERGIES:   Allergies  Allergen Reactions  . Benazepril Other (See Comments)      Dry cough   DISCHARGE MEDICATIONS:   Allergies as of 03/19/2018      Reactions   Benazepril Other (See Comments)   Dry cough      Medication List    STOP taking these medications   HYDROcodone-acetaminophen 5-325 MG tablet Commonly known as:  NORCO/VICODIN     TAKE these medications   amLODipine 5 MG tablet Commonly known as:  NORVASC Take 5 mg by mouth daily.   aspirin EC 81 MG tablet Take 81 mg by mouth daily.   CALCIUM-VITAMIN D PO Take 1 tablet by mouth daily.   cephALEXin 500 MG capsule Commonly known as:  KEFLEX Take 1 capsule (500 mg total) by mouth 3 (three) times daily for 9 days.   docusate sodium 100 MG capsule Commonly known as:  COLACE Take 200 mg by mouth daily as needed for mild constipation.   finasteride 5 MG tablet Commonly known as:  PROSCAR Take 1 tablet (5 mg total) by mouth daily. Start taking on:  03/20/2018   MULTIVITAMIN PO Take 1 tablet by mouth daily.   nitroGLYCERIN 0.4 MG SL tablet Commonly known as:  NITROSTAT Place 0.4 mg under the tongue every 5 (five) minutes as needed for chest pain.   oxybutynin 5 MG tablet Commonly known as:  DITROPAN Take 1 tablet (5 mg total) by mouth every 8 (eight) hours as needed for bladder spasms.   predniSONE 10 MG tablet Commonly known as:  DELTASONE Take 10 mg by mouth daily with breakfast.   RED YEAST RICE  PO Take 1 capsule by mouth 2 (two) times daily.   tamsulosin 0.4 MG Caps capsule Commonly known as:  FLOMAX Take 0.4 mg by mouth daily after breakfast.        DISCHARGE INSTRUCTIONS:   1.  Discharge with Foley catheter.  Follow-up with urology in 1 week 2.  PCP follow-up in 1 to 2 weeks  DIET:   Cardiac diet  ACTIVITY:   Activity as tolerated  OXYGEN:   Home Oxygen: No.  Oxygen Delivery: room air  DISCHARGE LOCATION:   home   If you experience worsening of your admission symptoms, develop shortness of breath, life threatening emergency, suicidal or homicidal  thoughts you must seek medical attention immediately by calling 911 or calling your MD immediately  if symptoms less severe.  You Must read complete instructions/literature along with all the possible adverse reactions/side effects for all the Medicines you take and that have been prescribed to you. Take any new Medicines after you have completely understood and accpet all the possible adverse reactions/side effects.   Please note  You were cared for by a hospitalist during your hospital stay. If you have any questions about your discharge medications or the care you received while you were in the hospital after you are discharged, you can call the unit and asked to speak with the hospitalist on call if the hospitalist that took care of you is not available. Once you are discharged, your primary care physician will handle any further medical issues. Please note that NO REFILLS for any discharge medications will be authorized once you are discharged, as it is imperative that you return to your primary care physician (or establish a relationship with a primary care physician if you do not have one) for your aftercare needs so that they can reassess your need for medications and monitor your lab values.    On the day of Discharge:  VITAL SIGNS:   Blood pressure (!) 160/65, pulse (!) 57, temperature 98 F (36.7 C), temperature source Oral, resp. rate 16, height 5' 5.5" (1.664 m), weight 66.9 kg, SpO2 99 %.  PHYSICAL EXAMINATION:   GENERAL:  84 y.o.-year-old patient lying in the bed with no acute distress.  EYES: Pupils equal, round, reactive to light and accommodation. No scleral icterus. Extraocular muscles intact.  HEENT: Head atraumatic, normocephalic. Oropharynx and nasopharynx clear.  NECK:  Supple, no jugular venous distention. No thyroid enlargement, no tenderness.  LUNGS: Normal breath sounds bilaterally, no wheezing, rales,rhonchi or crepitation. No use of accessory muscles of respiration.   CARDIOVASCULAR: S1, S2 normal. No  rubs, or gallops. 2/6 systolic murmur present ABDOMEN: Soft, nontender, nondistended. Bowel sounds present. No organomegaly or mass.  EXTREMITIES: No pedal edema, cyanosis, or clubbing.  NEUROLOGIC: Cranial nerves II through XII are intact. Muscle strength 5/5 in all extremities. Sensation intact. Gait not checked.  PSYCHIATRIC: The patient is alert and oriented x 3.  SKIN: No obvious rash, lesion, or ulcer.    DATA REVIEW:   CBC Recent Labs  Lab 03/17/18 0424  WBC 15.2*  HGB 13.3  HCT 38.9*  PLT 267    Chemistries  Recent Labs  Lab 03/17/18 0424  NA 133*  K 4.0  CL 101  CO2 24  GLUCOSE 98  BUN 24*  CREATININE 1.17  CALCIUM 8.6*  AST 32  ALT 21  ALKPHOS 45  BILITOT 0.9     Microbiology Results  Results for orders placed or performed during the hospital encounter of 03/17/18  Culture, blood (Routine x 2)     Status: None (Preliminary result)   Collection Time: 03/17/18  4:24 AM  Result Value Ref Range Status   Specimen Description BLOOD BLOOD RIGHT FOREARM  Final   Special Requests   Final    BOTTLES DRAWN AEROBIC AND ANAEROBIC Blood Culture adequate volume   Culture   Final    NO GROWTH 2 DAYS Performed at [facility], [address] [place], [place] [address]    Report Status PENDING  Incomplete  Culture, blood (Routine x 2)     Status: None (Preliminary result)   Collection Time: 03/17/18  4:24 AM  Result Value Ref Range Status   Specimen Description BLOOD LEFT ANTECUBITAL  Final   Special Requests   Final    BOTTLES DRAWN AEROBIC AND ANAEROBIC Blood Culture results may not be optimal due to an excessive volume of blood received in culture bottles   Culture   Final    NO GROWTH 2 DAYS Performed at [facility], [address], [place], [place] [address]    Report Status PENDING  Incomplete  Urine Culture     Status: Abnormal (Preliminary result)   Collection Time: 03/17/18  4:00 PM  Result  Value Ref Range Status   Specimen Description   Final    URINE, RANDOM Performed at [facility], [address] [place], [place] [address]    Special Requests   Final    NONE Performed at [facility], [address], [place], [place] [address]    Culture >=100,000 COLONIES/mL ESCHERICHIA COLI (A)  Final   Report Status PENDING  Incomplete    RADIOLOGY:  No results found.   Management plans discussed with the patient, family and they are in agreement.  CODE STATUS:     Code Status Orders  (From admission, onward)         Start     Ordered   03/17/18 0729  Full code  Continuous     03/17/18 0728        Code Status History    Date Active Date Inactive Code Status Order ID Comments User Context   05/22/2016 1548 05/22/2016 1849 Full Code [id number]  Alwyn Pea, MD Inpatient      TOTAL TIME TAKING CARE OF THIS PATIENT: 38 minutes.    Enid Baas M.D on 03/19/2018 at 2:43 PM  Between 7am to 6pm - Pager - [phone]  After 6pm go to [email or web address] - Social research officer, government  [facility]  Office  [phone]  CC: Primary care physician; Marisue Ivan, MD   Note: This dictation was prepared with Dragon dictation along with smaller phrase technology. Any transcriptional errors that result from this process are unintentional.

## 2018-03-19 NOTE — Progress Notes (Signed)
Physical Therapy Treatment Patient Details Name: Caleb Khan MRN: [id number] DOB: [birth year] Today's Date: 03/19/2018    History of Present Illness Pt presents to hospital on 03/17/18 complaining of a fever and hematuria. Pt subsequently diagnosed with a UTI and sepsis. Pts past medical history includes anemia, OA, anginal pain, CHF, CKD, and HTN    PT Comments    Pt is making good progress towards goals with increased ambulation distance this session with use of RW. Pt reports he feels more secure holding to RW. Brought SPC, however declined and preferred RW. Safe ambulation noted with reciprocal gait pattern. Will continue to progress as able.   Follow Up Recommendations  Outpatient PT     Equipment Recommendations  None recommended by PT;Other (comment)    Recommendations for Other Services       Precautions / Restrictions Precautions Precautions: None Restrictions Weight Bearing Restrictions: No    Mobility  Bed Mobility Overal bed mobility: Independent             General bed mobility comments: Pt moves supine <> sitting EOB independently without difficulty  Transfers Overall transfer level: Modified independent Equipment used: Rolling walker (2 wheeled)             General transfer comment: upright posture noted with RW. Safe technique  Ambulation/Gait Ambulation/Gait assistance: Min guard Gait Distance (Feet): 300 Feet Assistive device: Rolling walker (2 wheeled) Gait Pattern/deviations: Step-through pattern Gait velocity: 10' in 7 seconds   General Gait Details: ambulated with increased speed and reciprocal gait pattern. Safe technique performed. NO fatigue present   Social research officer, governmenttairs             Wheelchair Mobility    Modified Rankin (Stroke Patients Only)       Balance                                            Cognition Arousal/Alertness: Awake/alert Behavior During Therapy: WFL for tasks assessed/performed Overall  Cognitive Status: Within Functional Limits for tasks assessed                                        Exercises      General Comments        Pertinent Vitals/Pain Pain Assessment: No/denies pain    Home Living                      Prior Function            PT Goals (current goals can now be found in the care plan section) Acute Rehab PT Goals Patient Stated Goal: to go home today PT Goal Formulation: With patient Time For Goal Achievement: 03/31/18 Potential to Achieve Goals: Good Progress towards PT goals: Progressing toward goals    Frequency    Min 2X/week      PT Plan Current plan remains appropriate    Co-evaluation              AM-PAC PT "6 Clicks" Daily Activity  Outcome Measure  Difficulty turning over in bed (including adjusting bedclothes, sheets and blankets)?: None Difficulty moving from lying on back to sitting on the side of the bed? : None Difficulty sitting down on and standing up from a chair with arms (e.g., wheelchair, bedside  commode, etc,.)?: None Help needed moving to and from a bed to chair (including a wheelchair)?: None Help needed walking in hospital room?: None Help needed climbing 3-5 steps with a railing? : A Little 6 Click Score: 23    End of Session Equipment Utilized During Treatment: Gait belt Activity Tolerance: Patient tolerated treatment well Patient left: in chair;with chair alarm set Nurse Communication: Mobility status PT Visit Diagnosis: Unsteadiness on feet (R26.81);Other abnormalities of gait and mobility (R26.89);Difficulty in walking, not elsewhere classified (R26.2);Pain     Time: 1610-9604 PT Time Calculation (min) (ACUTE ONLY): 14 min  Charges:  $Gait Training: 8-22 mins                     Elizabeth Palau, PT, DPT [phone]    Caleb Khan 03/19/2018, 11:50 AM

## 2018-03-19 NOTE — Progress Notes (Signed)
[facility] - [place] at [facility]   PATIENT NAME: Malachy MoodLeon Will    MR#:  [id number]  DATE OF BIRTH:  10/09/1927  SUBJECTIVE:  CHIEF COMPLAINT:   Chief Complaint  Patient presents with  . Fever   -Admitted with UTI, also urinary retention requiring intermittent catheterizations. -Cultures are pending.  Patient feels great and would like to go home today  REVIEW OF SYSTEMS:  Review of Systems  Constitutional: Negative for chills, fever and malaise/fatigue.  HENT: Negative for congestion, ear discharge, hearing loss and nosebleeds.   Eyes: Negative for blurred vision and double vision.  Respiratory: Negative for cough, shortness of breath and wheezing.   Cardiovascular: Negative for chest pain, palpitations and leg swelling.  Gastrointestinal: Negative for abdominal pain, constipation, diarrhea, nausea and vomiting.  Genitourinary: Negative for dysuria.  Musculoskeletal: Negative for myalgias.  Neurological: Negative for dizziness, focal weakness, seizures, weakness and headaches.  Psychiatric/Behavioral: Negative for depression.    DRUG ALLERGIES:   Allergies  Allergen Reactions  . Benazepril Other (See Comments)    Dry cough    VITALS:  Blood pressure (!) 160/65, pulse (!) 57, temperature 98 F (36.7 C), temperature source Oral, resp. rate 16, height 5' 5.5" (1.664 m), weight 66.9 kg, SpO2 99 %.  PHYSICAL EXAMINATION:  Physical Exam  GENERAL:  84 y.o.-year-old patient lying in the bed with no acute distress.  EYES: Pupils equal, round, reactive to light and accommodation. No scleral icterus. Extraocular muscles intact.  HEENT: Head atraumatic, normocephalic. Oropharynx and nasopharynx clear.  NECK:  Supple, no jugular venous distention. No thyroid enlargement, no tenderness.  LUNGS: Normal breath sounds bilaterally, no wheezing, rales,rhonchi or crepitation. No use of accessory muscles of respiration.  CARDIOVASCULAR: S1, S2 normal. No  rubs, or  gallops. 2/6 systolic murmur present ABDOMEN: Soft, nontender, nondistended. Bowel sounds present. No organomegaly or mass.  EXTREMITIES: No pedal edema, cyanosis, or clubbing.  NEUROLOGIC: Cranial nerves II through XII are intact. Muscle strength 5/5 in all extremities. Sensation intact. Gait not checked.  PSYCHIATRIC: The patient is alert and oriented x 3.  SKIN: No obvious rash, lesion, or ulcer.    LABORATORY PANEL:   CBC Recent Labs  Lab 03/17/18 0424  WBC 15.2*  HGB 13.3  HCT 38.9*  PLT 267   ------------------------------------------------------------------------------------------------------------------  Chemistries  Recent Labs  Lab 03/17/18 0424  NA 133*  K 4.0  CL 101  CO2 24  GLUCOSE 98  BUN 24*  CREATININE 1.17  CALCIUM 8.6*  AST 32  ALT 21  ALKPHOS 45  BILITOT 0.9   ------------------------------------------------------------------------------------------------------------------  Cardiac Enzymes Recent Labs  Lab 03/17/18 0424  TROPONINI <0.03   ------------------------------------------------------------------------------------------------------------------  RADIOLOGY:  No results found.  EKG:   Orders placed or performed during the hospital encounter of 03/17/18  . ED EKG  . ED EKG    ASSESSMENT AND PLAN:   84 year old male with past medical history significant for CAD, hypertension, CHF, BPH presents to hospital secondary to sepsis  1.  Clinical sepsis-secondary to urinary tract infections. -Blood cultures are negative, urine cultures growing E. coli-sensitivities pending -Was on meropenem, will be changed to oral antibiotics at discharge today. -No fevers in the last 24 hours.  2.  Urinary retention-patient had a Foley catheter due to bladder issues from prostrate as outpatient.  Failed voiding trial and is on intermittent catheterizations at home.  That could have been the reason for his UTI. -Case was discussed with his urologist  by Dr. Renae GlossWieting yesterday.  Plan to discharge with the Foley catheter and urology follow-up as outpatient. -Started on Flomax and Proscar  3.  Hypertension-continue Norvasc  4.  DVT prophylaxis-patient is ambulatory.   Possible discharge later today   All the records are reviewed and case discussed with Care Management/Social Workerr. Management plans discussed with the patient, family and they are in agreement.  CODE STATUS: Full Code  TOTAL TIME TAKING CARE OF THIS PATIENT: 38 minutes.   POSSIBLE D/C IN 1-2 DAYS, DEPENDING ON CLINICAL CONDITION.   Enid Baas M.D on 03/19/2018 at 11:48 AM  Between 7am to 6pm - Pager - [phone]  After 6pm go to [email or web address] - Social research officer, government  [facility]  Office  [phone]  CC: Primary care physician; Marisue Ivan, MD

## 2018-03-20 LAB — URINE CULTURE: Culture: >=100000 — AB

## 2018-03-22 LAB — CULTURE, BLOOD (ROUTINE X 2)
Culture: NO GROWTH
Culture: NO GROWTH
Special Requests: ADEQUATE

## 2018-04-19 ENCOUNTER — Other Ambulatory Visit: Payer: Self-pay

## 2018-04-19 ENCOUNTER — Encounter
Admission: RE | Admit: 2018-04-19 | Discharge: 2018-04-19 | Disposition: A | Discharge status: Home / Self Care | Payer: Medicare PPO | Source: Ambulatory Visit | Attending: Urology | Admitting: Urology

## 2018-04-19 HISTORY — DX: Polymyalgia rheumatica: M35.3

## 2018-04-19 HISTORY — DX: Chronic obstructive pulmonary disease, unspecified: J44.9

## 2018-04-19 NOTE — Pre-Procedure Instructions (Signed)
Progress Notes - documented in this encounter  Table of Contents for Progress Notes  Mertie Moores, MD - 11/23/2017 9:45 AM EDT  Verneda Skill, RT - 11/23/2017 9:45 AM EDT    Mertie Moores, MD - 11/23/2017 9:45 AM EDT Formatting of this note might be different from the original.  Follow-up  History of Present Illness: Caleb Khan is a 84 y.o. male that presents to clinic for recheck. Chronic dyspnea on exertion. Limiting his golf game. Angina going up inclines, mild edema, no calf pain. No wheezing, no coughing, no hemoptysis ex smoker. Quit > 40 years.   Current Medications:  Current Outpatient Medications  Medication Sig Dispense Refill  . amLODIPine (NORVASC) 5 MG tablet Take 1 tablet (5 mg total) by mouth 2 (two) times daily (Patient taking differently: Take 5 mg by mouth once daily ) 180 tablet 3  . aspirin 81 MG EC tablet Take 81 mg by mouth once daily.  . calcium carbonate-vitamin D3 (CALTRATE 600+D) 600 mg(1,500mg ) -400 unit tablet Take 1 tablet by mouth once daily.  Marland Kitchen losartan (COZAAR) 25 MG tablet TAKE 1 TABLET EVERY NIGHT 90 tablet 1  . multivitamin tablet Take 1 tablet by mouth once daily.  . nitroGLYcerin (NITROSTAT) 0.4 MG SL tablet Place 1 tablet (0.4 mg total) under the tongue every 5 (five) minutes as needed for Chest pain May take up to 3 doses. 75 tablet 3  . predniSONE (DELTASONE) 10 MG tablet TAKE 1 TABLET EVERY DAY 90 tablet 1  . RED YEAST RICE ORAL Take 2 tablets by mouth 2 (two) times daily.   Marland Kitchen terazosin (HYTRIN) 10 MG capsule TAKE 1 CAPSULE EVERY DAY 90 capsule 3  . zolpidem (AMBIEN) 10 mg tablet 10 mg (Patient taking differently: Take 5 mg by mouth nightly as needed )   No current facility-administered medications for this visit.   Problem List:  Patient Active Problem List  Diagnosis  . Essential hypertension  . Pure hypercholesterolemia (LDL 79 - 06/15/17) - intolerant to statin; currently on red yeast rice  . CAD (coronary artery  disease), native coronary artery - followed by Dr. Juliann Pares  . Polymyalgia rheumatica syndrome on prednisone  . Angina pectoris (CMS-HCC)  . Vaccine counseling: TDAP vaccine administered on 06/04/17 & 11/27/10 (previous Td- 06/25/05); PNA vaccine administered in 2003? per pt; Zostavax- 2011; PCV-13: 09/12/15 (06/24/17)  . Initial Medicare wellness exam: 04/29/12; subsequent Medicare wellness exam: 01/14/16  . Pulmonary emphysema (CMS-HCC)  . Medicare annual wellness visit, subsequent 10/15/16  . CKD (chronic kidney disease) stage 3, (Cr 1.3, GFR 52 - 06/15/17)  . Anemia of chronic disease (Hgb 13 - 06/15/17)   History: Past Medical History:  Diagnosis Date  . Angina pectoris (CMS-HCC)  HX  . BPH (benign prostatic hyperplasia)  . CAD (coronary artery disease), native coronary artery  . Chronic renal insufficiency  . COPD (chronic obstructive pulmonary disease) , unspecified (CMS-HCC)  . HTN (hypertension)  . Hyperlipidemia, unspecified  . Insomnia  . Normocytic anemia, unspecified  . Pneumonia, unspecified, unspecified 1931,1995  . Polymyalgia rheumatica syndrome (CMS-HCC)  . Sleep apnea   Past Surgical History:  Procedure Laterality Date  . 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 CARDIAC CATHETERIZATION Oct. 2017  No problems revealed  . CATARACT EXTRACTION Bilateral  . COLONOSCOPY  2005 and 10/25/2007 adenomatous polyps  . KNEE ARTHROSCOPY Right  . L patellar and quadriceps tendon repair on 07/15/2017 Left 07/15/2017  Kennedy Bucker,  MD  . Left ankle replacement 04/2006  . TONSILLECTOMY  . TOTAL SHOULDER REPLACEMENT Right  . Wisdom teeth removal   Family History  Problem Relation Age of Onset  . Coronary Artery Disease (Blocked arteries around heart) Mother  . Heart failure Mother  . High blood pressure (Hypertension) Mother  . High blood pressure (Hypertension) Father  . Dementia Father  . High blood pressure (Hypertension)  Brother   Social History   Socioeconomic History  . Marital status: Married  Spouse name: Not on file  . Number of children: Not on file  . Years of education: Not on file  . Highest education level: Not on file  Occupational History  . Not on file  Social Needs  . Financial resource strain: Patient refused  . Food insecurity:  Worry: Patient refused  Inability: Patient refused  . Transportation needs:  Medical: Patient refused  Non-medical: Patient refused  Tobacco Use  . Smoking status: Former Smoker  Packs/day: 2.00  Years: 22.00  Pack years: 44.00  Types: Cigarettes  Start date: 07/28/1947  Last attempt to quit: 07/27/1969  Years since quitting: 48.3  . Smokeless tobacco: Never Used  Substance and Sexual Activity  . Alcohol use: Yes  Alcohol/week: 0.0 oz  Comment: 1-2 glasses of red wine per day  . Drug use: No  . Sexual activity: Never  Other Topics Concern  . 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 Pretty much unchanged except he pulled tendons in his left leg. No associated cardiopulmonary, GI, GU, dermatological symptoms today. No focal neurological symptoms or psychological changes.   Physical Exam: BP 139/64  Pulse 58  Wt 75.3 kg (166 lb)  SpO2 98%  BMI 28.49 kg/m 75.3 kg (166 lb) 98% General: NAD. Able to speak in complete sentences without cough or dyspnea HEENT: Normocephalic, nontraumatic. Extraocular movements intact NECK: Supple. No JVD, nodes, thyromegaly CV: RRR no murmurs, gallops, rubs PULM: Normal respiratory effort, Clear to auscultation bilaterally without wheezing or crackles EXTREMITIES: +ve edema, no cyanosis or Homans'signs SKIN: Fair  turgor. No rashes LYMPHATIC: No nodes NEURO: No gross deficits, alert,  PSYCH: Appropriate affect, alert, good insight   Diagnostics: LUNG VOLUMES: TLC was 100% of predicted RV was 47% of predicted  DIFFUSION CAPACITY: DLCO was 70% of predicted DLCO/VA was 59% of predicted  Impression DLCO is mildly decrease   BASELINE END BASELINE END  SpO2 96% 95% - -  HR 68 86 - -  DYSPNEA 3 3 - -  FATIGUE 2 3 - -  DISTANCE METERS 330 -   SOB (shortness of breath), there is mild-moderate copd, cad, elevated rvsp but does not appear deconditioned. Mild edema in legs ? Due to norvasc ?? Early right heart failure ( doubt) but following. + nocturnal oxygen desaturation. No change clinically overall. His DLCO is down. He is mildly anemic  Prn albuterol Following serial six min walk, dlco, lung volumes Chest xray Following cardiology recs Maintain ideal weight Pulmonary rehab Follow up in pulmonary in6 months Further orders per above     Electronically signed by Mertie Moores, MD at 11/23/2017 9:15 AM EDT  Back to top of Progress Notes Verneda Skill,  RT - 11/23/2017 9:45 AM EDT Formatting of this note might be different from the original. Procedure Diffusion and Lung Volumes  Reason for PFT SHORTNESS OF BREATH in Ex Smoker  Findings Myrtis SerLeon C Mcmillon is a 84 y.o. male reports that he quit smoking about 48 years ago. His smoking use included cigarettes. He started smoking about 70 years ago. He has a 44.00 pack-year smoking history. He has never used smokeless tobacco.  LUNG VOLUMES: TLC was 100% of predicted RV was 47% of predicted  DIFFUSION CAPACITY: DLCO was 70% of predicted DLCO/VA was 59% of predicted  Impression DLCO is mildly decrease   SIX MINUTE WALK TEST  Ht: Wt: FAO:ZHYQMBSA:There is no height or weight on file to calculate BSA. There is no height or weight on file to calculate BMI. 84 y.o. DOE (dyspnea on exertion) (primary encounter diagnosis) Plan: Carbon  monoxide diffusing capacity (DLCO), Lung Volumes, Simple exercise test(6 min walk)  There were no vitals filed for this visit. PREVIOUS STUDY - BASELINE END BASELINE END  SpO2 96% 95% - -  HR 68 86 - -  DYSPNEA 3 3 - -  FATIGUE 2 3 - -  DISTANCE METERS 330 -   Stopped or paused before 6 minutes?  []  Yes [x]  No Reason for Stopping:  Time Walked, if less than 6 minutes:   Other Symptoms at the end of exercise:  []  Angina []  Dizziness []  Hip pain  []  Leg pain []  Calf pain *No issues noted, patient did state if he had walked with any grade whatsoever, he would have to take Nitro for Angina  Supplemental O2 used during test? []  Yes Liter Flow []  Continuous []  Intermittent [x]  No  BORG SCALE: 0 NOTHING AT ALL 0.5 VERY, VERY SLIGHT (JUST NOTICEABLE) 1 VERY SLIGHT 2 SLIGHT (LIGHT) 3 MODERATE 4 SOMEWHAT SEVERE 5 SEVERE (HEAVY) 6 7 VERY SEVERE 8 9 10  VERY, VERY SEVERE (MAXIMAL)   Electronically signed by Mertie MooresFleming, Herbon E, MD at 11/23/2017 9:15 AM EDT  Back to top of Progress Notes Plan of Treatment - documented as of this encounter  Upcoming Encounters Upcoming Encounters  Date Type Specialty Care Team Description  12/21/2017 Ancillary Orders Lab Marisue IvanLinthavong, Kanhka, MD  [address]  [facility]  [place], [place] [address]  [phone]  [phone] (Fax)    12/28/2017 Office Visit Internal Medicine Marisue IvanLinthavong, Kanhka, MD  [address] [facility] [address]  [facility] [place]  [place], [place] [address]  [phone]  [phone])    05/17/2018 Office Visit Cardiology Whitehallallwood, Jorja Loawayne Dennis, MD  [address]  [facility]  [place], [place] [address]  [phone]  [phone] (Fax)    05/24/2018 Office Visit Pulmonology Mertie MooresFleming, Herbon E, MD  [phone] [facility] [address]  [facility]  [place], [place] [address]  [phone]  [phone] (Fax)     Scheduled Orders Scheduled  Orders  Name Type Priority Associated Diagnoses Order Schedule  Carbon monoxide diffusing capacity (DLCO) PFT Routine DOE (dyspnea on exertion)  Ordered: 11/23/2017  Lung Volumes PFT Routine DOE (dyspnea on exertion)  Ordered: 11/23/2017  Simple exercise test(6 min walk) PFT Routine DOE (dyspnea on exertion)  Ordered: 11/23/2017  Goals - documented as of this encounter  Goal Patient Goal Type Associated Problems Recent Progress Patient-Stated? Author  Try to find out what to do to help with CKD, borderline heart failure, and pains in hips & upper legs  General   Yes Jerilynn Somavares, Rebecca R, RN  Procedures -  documented in this encounter  Procedure Name Priority Date/Time Associated Diagnosis Comments  PULMONARY FUNCTION TEST  11/23/2017 12:00 AM EDT  Results for this procedure are in the results section.   Imaging Results - documented in this encounter   X-ray chest PA and lateral (11/23/2017 9:25 AM EDT) X-ray chest PA and lateral (11/23/2017 9:25 AM EDT)  Specimen     X-ray chest PA and lateral (11/23/2017 9:25 AM EDT)  Narrative Performed At  This result has an attachment that is not available.       Miscellaneous Results - documented in this encounter   PULMONARY FUNCTION TEST (11/23/2017 12:00 AM EDT) PULMONARY FUNCTION TEST (11/23/2017 12:00 AM EDT)  Narrative Performed At  This result has an attachment that is not available.  Ordered by an unspecified provider.      Visit Diagnoses - documented in this encounter  Diagnosis  DOE (dyspnea on exertion) - Primary  Other dyspnea and respiratory abnormality   Pulmonary emphysema, unspecified emphysema type (CMS-HCC)   Nocturnal oxygen desaturation   Images Patient Contacts   Contact Name Contact Address Communication Relationship to Patient  Thurl Boen [address] [place], [place] [address] [phone] (Home) [email or web address] Spouse, Agent  Document Information  Primary Care Provider  Other Service Providers Document Coverage Dates  Marisue Ivan, MD (May 01, 2015May 01, 2015 - Present) DM: [phone] (Work) [phone]) [phone] [facility] [address] [facility] [place], [place] [address] Family Medicine  Apr. 30, 2019April 30, 2019   Custodian Organization  [facility] [address] [place], [place] [address]   Encounter Providers Encounter Date  Mertie Moores, MD (Attending) [phone] (Work) [phone] (Fax) [phone] [facility] - PULMONOLOGY [place], [place] [address] Pulmonary Disease Apr. 30, 2019April 30, 2019

## 2018-04-19 NOTE — Pre-Procedure Instructions (Signed)
Progress Notes - documented in this encounter  Caleb Capuchin, MD - 11/16/2017 10:30 AM EDT Formatting of this note might be different from the original. Established Patient Visit   Chief Complaint: Chief Complaint  Patient presents with  . Follow-up  6 MONTHS  . Chest Pain  ANGINA  . Leg Swelling  . Shortness of Breath  Date of Service: 11/16/2017 Date of Birth: 10/22/1927 PCP: Caleb Ivan, MD  History of Present Illness: Caleb Khan is a 84 y.o.male patient who patient states to complain of some fatigue shortness of breath has had angina in the past status post cardiac cath which showed no significant obstructive coronary disease patient has significant dyspnea history of COPD emphysema from previous history of smoking. He still fairly active walks but does not walk quite as much or as far because of generalized fatigue. He is been treated for polymyalgia rheumatica and states to be done is to be well here for routine follow-up  Past Medical and Surgical History  Past Medical History Past Medical History:  Diagnosis Date  . Angina pectoris (CMS-HCC)  HX  . BPH (benign prostatic hyperplasia)  . CAD (coronary artery disease), native coronary artery  . Chronic renal insufficiency  . COPD (chronic obstructive pulmonary disease) , unspecified (CMS-HCC)  . HTN (hypertension)  . Hyperlipidemia, unspecified  . Insomnia  . Normocytic anemia, unspecified  . Pneumonia, unspecified, unspecified 1931,1995  . Polymyalgia rheumatica syndrome (CMS-HCC)  . 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 2017); and L patellar and quadriceps tendon repair  on 07/15/2017 (Left, 07/15/2017).   Medications and Allergies  Current Medications  Current Outpatient Medications  Medication Sig Dispense Refill  . amLODIPine (NORVASC) 5 MG tablet Take 1 tablet (5 mg total) by mouth 2 (two) times daily (Patient taking differently: Take 5 mg by mouth once daily ) 180 tablet 3  . aspirin 81 MG EC tablet Take 81 mg by mouth once daily.  . calcium carbonate-vitamin D3 (CALTRATE 600+D) 600 mg(1,500mg ) -400 unit tablet Take 1 tablet by mouth once daily.  Marland Kitchen losartan (COZAAR) 25 MG tablet TAKE 1 TABLET EVERY NIGHT 90 tablet 1  . multivitamin tablet Take 1 tablet by mouth once daily.  . nitroGLYcerin (NITROSTAT) 0.4 MG SL tablet Place 1 tablet (0.4 mg total) under the tongue every 5 (five) minutes as needed for Chest pain May take up to 3 doses. 75 tablet 3  . predniSONE (DELTASONE) 10 MG tablet TAKE 1 TABLET EVERY DAY 90 tablet 1  . RED YEAST RICE ORAL Take 2 tablets by mouth 2 (two) times daily.   Marland Kitchen terazosin (HYTRIN) 10 MG capsule TAKE 1 CAPSULE EVERY DAY 90 capsule 3  . zolpidem (AMBIEN) 10 mg tablet 10 mg (Patient taking differently: Take 5 mg by mouth nightly as needed )   No current facility-administered medications for this visit.   Allergies: Benazepril  Social and Family History  Social History reports that he quit smoking about 48 years ago. His smoking use included cigarettes. He started smoking about 70 years ago. He has a 44.00 pack-year smoking history. He has never used smokeless tobacco. He reports that he drinks alcohol. He reports that he does not use drugs.  Family History Family History  Problem Relation  Age of Onset  . Coronary Artery Disease (Blocked arteries around heart) Mother  . Heart failure Mother  . High blood pressure (Hypertension) Mother  . High blood pressure (Hypertension) Father  . Dementia Father  . High blood pressure (Hypertension) Brother   Review of Systems   Review of Systems: The patient denies chest pain,  shortness of breath, orthopnea, paroxysmal nocturnal dyspnea, pedal edema, palpitations, heart racing, presyncope, syncope. Review of 12 Systems is negative except as described above.  Physical Examination   Vitals:BP 140/80 (BP Location: Left upper arm, Patient Position: Sitting, BP Cuff Size: Adult)  Pulse 63  Ht 162.6 cm (5\' 4" )  Wt 75 kg (165 lb 6.4 oz)  SpO2 97%  BMI 28.39 kg/m  Ht:162.6 cm (5\' 4" ) Wt:75 kg (165 lb 6.4 oz) ZOX:WRUEBSA:Body surface area is 1.84 meters squared. Body mass index is 28.39 kg/m.  HEENT: Pupils equally reactive to light and accomodation  Neck: Supple without thyromegaly, carotid pulses 2+ Lungs: clear to auscultation bilaterally; no wheezes, rales, rhonchi Heart: Regular rate and rhythm. No gallops, murmurs or rub Abdomen: soft nontender, nondistended, with normal bowel sounds Extremities: no cyanosis, clubbing, or edema Peripheral Pulses: 2+ in all extremities, 2+ femoral pulses bilaterally  Assessment   84 y.o. male with  1. Essential hypertension  2. Coronary artery disease involving native coronary artery of native heart without angina pectoris  3. Mixed hyperlipidemia  4. Chronic renal insufficiency, stage 3 (moderate) (CMS-HCC)  5. Polymyalgia rheumatica syndrome (CMS-HCC)  6. Pulmonary emphysema, unspecified emphysema type (CMS-HCC)  7. Edema of both legs  8. SOB (shortness of breath)  9. Chronic obstructive pulmonary disease, unspecified COPD type (CMS-HCC)  10. Chest pain, unspecified type  11. AP (angina pectoris) (CMS-HCC)  12. Other hyperlipidemia  13. CAD in native artery  14. Fatigue, unspecified type   Plan  1 hypertension by history chronic stable continue medical therapy 2 polymyalgia rheumatica syndrome continue steroid therapy follow-up with rheumatology 3 COPD chronic recurrent continue inhalers as necessary follow-up with pulmonary 4 generalized fatigue progresses he thinks based on age he still walking not quite as much  continue rest and exercise 5 angina stable status post cardiac cath with no significant coronary disease continue medical therapy 6 hyperlipidemia continue statin therapy for lipid management 7 chest pain intermittent recurrent reasonably stable continue conservative medical therapy 8 pulmonary emphysema previous history of smoking follow-up with pulmonary continue medical therapy 9 have the patient follow-up in 6 months  Return in about 6 months (around 05/18/2018).  Caleb PeaWAYNE D CALLWOOD, MD    Electronically signed by Caleb Capuchinallwood, Dwayne Dennis, MD at 11/29/2017 1:01 PM EDT  Plan of Treatment - documented as of this encounter  Upcoming Encounters Upcoming Encounters  Date Type Specialty Care Team Description  05/03/2018 Ancillary Orders Lab Caleb IvanLinthavong, Kanhka, MD  [address] [facility] [address]  [facility] [place]  [place], [place] [address]  [phone]  [phone] (Fax)    05/10/2018 Office Visit Internal Medicine Caleb IvanLinthavong, Kanhka, MD  [address] [address]  [facility] [place]  [place], [place] [address]  [phone]  [phone])    05/17/2018 Initial consult Physical Medicine and Rehabilitation Chanda BusingChasnis, Benjamin Charles, DO  [address]  [place], [place] [address]  [phone]  [phone] (Fax)    Meeler, CurlewWhitney, NP  [address]  [place], [place] [address]  [phone]  [phone] (Fax)    05/17/2018 Office Visit Cardiology Bendallwood, Jorja Loawayne Dennis, MD  [address]  [facility]  [place],  [place] [address]  [phone]  [phone] (Fax)    05/24/2018 Office Visit Pulmonology Mertie Moores, MD  [phone] [facility] [address]  [facility] - PULMONOLOGY  [place], [place] [address]  [phone] (Fax)    Goals - documented as of this encounter  Goal Patient Goal Type Associated Problems Recent Progress Patient-Stated? Author  Try to find out what to do to help with  CKD, borderline heart failure, and pains in hips & upper legs  General   Yes Jerilynn Som, RN  Visit Diagnoses - documented in this encounter  Diagnosis  Essential hypertension - Primary   Coronary artery disease involving native coronary artery of native heart without angina pectoris   Mixed hyperlipidemia   Chronic renal insufficiency, stage 3 (moderate) (CMS-HCC)   Polymyalgia rheumatica syndrome (CMS-HCC)  Polymyalgia rheumatica   Pulmonary emphysema, unspecified emphysema type (CMS-HCC)   Edema of both legs  Edema   SOB (shortness of breath)  Shortness of breath   Chronic obstructive pulmonary disease, unspecified COPD type (CMS-HCC)   Chest pain, unspecified type   AP (angina pectoris) (CMS-HCC)  Other and unspecified angina pectoris   Other hyperlipidemia   CAD in native artery   Fatigue, unspecified type   Images Patient Contacts   Contact Name Contact Address Communication Relationship to Patient  Garnell Phenix [address] [place], [place] [address] [phone] (Home) [email or web address] Spouse, Agent  Document Information  Primary Care Provider Other Service Providers Document Coverage Dates  Caleb Ivan, MD (May 01, 2015May 01, 2015 - Present) DM: [phone] (Work) [phone] (Fax) [phone] [facility] [address] [facility] [place], [place] [address] Family Medicine  Apr. 23, 2019April 23, 2019   Custodian Organization  [facility] [address] [place], [place] [address]   Encounter Providers Encounter Date  Dwayne Dorthey Sawyer, MD (Attending) [phone] (Work) [phone] (Fax) [phone] [facility] - CARDIOLOGY [place], [place] [address] Cardiovascular Disease Apr. 23, 2019April 23, 2019

## 2018-04-19 NOTE — Patient Instructions (Signed)
Your procedure is scheduled on: 04-26-18 TUESDAY Report to Same Day Surgery 2nd floor medical mall [facility] Entrance-take elevator on left to 2nd floor.  Check in with surgery information desk.) To find out your arrival time please call [phone] between 1PM - 3PM on 04-25-18 MONDAY  Remember: Instructions that are not followed completely may result in serious medical risk, up to and including death, or upon the discretion of your surgeon and anesthesiologist your surgery may need to be rescheduled.    _x___ 1. Do not eat food after midnight the night before your procedure. NO GUM OR CANDY AFTER MIDNIGHT.  You may drink clear liquids up to 2 hours before you are scheduled to arrive at the hospital for your procedure.  Do not drink clear liquids within 2 hours of your scheduled arrival to the hospital.  Clear liquids include  --Water or Apple juice without pulp  --Clear carbohydrate beverage such as ClearFast or Gatorade  --Black Coffee or Clear Tea (No milk, no creamers, do not add anything to the coffee or Tea   ____Ensure clear carbohydrate drink on the way to the hospital for bariatric patients  ____Ensure clear carbohydrate drink 3 hours before surgery for Dr Rutherford Nail patients if physician instructed.    __x__ 2. No Alcohol for 24 hours before or after surgery.   __x__3. No Smoking or e-cigarettes for 24 prior to surgery.  Do not use any chewable tobacco products for at least 6 hour prior to surgery   ____  4. Bring all medications with you on the day of surgery if instructed.    __x__ 5. Notify your doctor if there is any change in your medical condition     (cold, fever, infections).    x___6. On the morning of surgery brush your teeth with toothpaste and water.  You may rinse your mouth with mouth wash if you wish.  Do not swallow any toothpaste or mouthwash.   Do not wear jewelry, make-up, hairpins, clips or nail polish.  Do not wear lotions, powders, or perfumes. You  may wear deodorant.  Do not shave 48 hours prior to surgery. Men may shave face and neck.  Do not bring valuables to the hospital.    [facility] is not responsible for any belongings or valuables.               Contacts, dentures or bridgework may not be worn into surgery.  Leave your suitcase in the car. After surgery it may be brought to your room.  For patients admitted to the hospital, discharge time is determined by your treatment team.  _  Patients discharged the day of surgery will not be allowed to drive home.  You will need someone to drive you home and stay with you the night of your procedure.    Please read over the following fact sheets that you were given:   [facility] Preparing for Surgery  _x___ TAKE THE FOLLOWING MEDICATION THE MORNING OF SURGERY WITH A SMALL SIP OF WATER. These include:  1. AMLODIPINE (NORVASC)  2. PREDNISONE  3. FLOMAX (TAMSULOSIN)  4.  5.  6.  ____Fleets enema or Magnesium Citrate as directed.   ____ Use CHG Soap or sage wipes as directed on instruction sheet   ____ Use inhalers on the day of surgery and bring to hospital day of surgery  ____ Stop Metformin and Janumet 2 days prior to surgery.    ____ Take 1/2 of usual insulin dose the  night before surgery and none on the morning surgery.   _X___ Follow recommendations from Cardiologist, Pulmonologist or PCP regarding stopping Aspirin, Coumadin, Plavix ,Eliquis, Effient, or Pradaxa, and Pletal-PT STOPPED ASPIRIN ON 03-19-18  X____Stop Anti-inflammatories such as Advil, Aleve, Ibuprofen, Motrin, Naproxen, Naprosyn, Goodies powders or aspirin products NOW-OK to take Tylenol    _x___ Stop supplements until after surgery-STOP RED YEAST RICE NOW-YOU MAY RESUME AFTER SURGERY   ____ Bring C-Pap to the hospital.

## 2018-04-20 ENCOUNTER — Encounter
Admission: RE | Admit: 2018-04-20 | Discharge: 2018-04-20 | Disposition: A | Discharge status: Home / Self Care | Payer: Medicare PPO | Source: Ambulatory Visit | Attending: Urology | Admitting: Urology

## 2018-04-20 DIAGNOSIS — I509 Heart failure, unspecified: Secondary | ICD-10-CM | POA: Insufficient documentation

## 2018-04-20 DIAGNOSIS — R001 Bradycardia, unspecified: Secondary | ICD-10-CM | POA: Diagnosis not present

## 2018-04-20 DIAGNOSIS — Z01810 Encounter for preprocedural cardiovascular examination: Secondary | ICD-10-CM | POA: Diagnosis not present

## 2018-04-20 DIAGNOSIS — I110 Hypertensive heart disease with heart failure: Secondary | ICD-10-CM | POA: Insufficient documentation

## 2018-04-22 NOTE — H&P (Signed)
Caleb Khan, RIERA MEDICAL RECORD ZO:[id number] ACCOUNT [id number] DATE OF BIRTH:[birth year] FACILITY: [facility] LOCATION: [facility] PHYSICIAN:Rahkeem Senft Gilles Chiquito, MD  HISTORY AND PHYSICAL  DATE OF ADMISSION:  04/26/2018  SAME DAY SURGERY:  10/01.  CHIEF COMPLAINT:  Inability to urinate.  HISTORY OF PRESENT ILLNESS:  The patient is an 84 year old white male with a long history of difficulty voiding who presented with urinary retention.  He was treated with tamsulosin and has failed 3 voiding trials.  Cystoscopy in the office on 09/16  indicated trilobar BPH with intravesical growth of median lobe.  The bladder was heavily trabeculated and had numerous cellules and small diverticula present.  Prostate ultrasound indicated 104 gram prostate with trilobar BPH.  PSA level was 5.2 ng/mL.   A creatinine was 1.27 ng/mL.  The patient comes in now for photovaporization of the prostate with the GreenLight laser.  ALLERGIES:  No drug allergies.  CURRENT MEDICATIONS:  Tamsulosin, amlodipine, prednisone, aspirin and red yeast rice.  PAST SURGICAL HISTORY:  Right shoulder replacement in 2004, left ankle replacement in 2007, repair of torn left leg tendon in 2018.  PAST AND CURRENT MEDICAL CONDITIONS:  1.  Coronary artery disease. 2.  COPD with oxygen dependency at night. 3.  Spinal stenosis. 4.  Hypertension. 5.  Polymyalgia rheumatica. 6.  History of elevated PSA with a negative biopsy 20 years ago.  REVIEW OF SYSTEMS:  The patient has chronic shortness of breath and occasional chest discomfort.  He uses nitroglycerin on rare occasions for chest discomfort.  He has some weakness.  He had a history of urinary incontinence at night prior to  catheterization.  SOCIAL HISTORY:  The patient quit smoking 12 years ago with a 22-pack-year history.  He consumes seven alcoholic beverages per week.  FAMILY HISTORY:  Father died at age 39 with dementia.  Mother died at age 18 of heart disease.  PHYSICAL  EXAMINATION: GENERAL:  A thin white male in no acute distress. HEENT:  Sclerae were clear.  Pupils are equally round, reactive to light and accommodation.  Extraocular movements are intact. NECK:  No palpable cervical adenopathy. PULMONARY:  Lungs are clear to auscultation. CARDIOVASCULAR:  Regular rhythm and rate without audible murmurs. ABDOMEN:  Soft, nontender abdomen. GENITOURINARY:  Circumcised, testes smooth, nontender. RECTAL:  Greater than 50 gram prostate with a firm right nodule at the base. NEUROMUSCULAR:  Alert and oriented x3.  IMPRESSION: 1.  Urinary retention. 2.  Nodular prostate. 3.  Elevated PSA. 4.  Urinary retention. 5.  Benign prostatic hypertrophy.  PLAN:  Photovaporization of prostate with GreenLight laser.  TN/NUANCE  D:04/21/2018 T:04/21/2018 JOB:[id number]

## 2018-04-25 ENCOUNTER — Encounter: Payer: Self-pay | Admitting: *Deleted

## 2018-04-25 NOTE — Pre-Procedure Instructions (Signed)
[facility]  CARDIAC CATHETERIZATION  Order# [id number]  Reading physician: Alwyn Pea, MD Ordering physician: Alwyn Pea, MD Study date: 05/22/16  Physicians   Panel Physicians Referring Physician Case Authorizing Physician  Alwyn Pea, MD (Primary)    Procedures   Left Heart Cath and Coronary Angiography  Conclusion     Ost LAD to Prox LAD lesion, 40 %stenosed.  Ost 1st Diag lesion, 75 %stenosed.  Mid LAD lesion, 25 %stenosed.  The left ventricular systolic function is normal.  LV end diastolic pressure is normal.  There is no aortic valve regurgitation.   Conclusion No significant flow-limiting epicardial coronary disease except for ostial diagonal is medium size Normal overall left ventricular function ejection fraction around 60% Relatively normal aortic root   Procedural Details/Technique   Technical Details Operation diagnostic cardiac catheter because of unstable anginal symptoms. Patient was brought to the catheter lab and 20 cc of 1% lidocaine was used to anesthetize the right groin. 5 French sheath was inserted into the right femoral artery without difficulty. 5 French pigtail was inserted retrograde into the left ventricular ventriculogram left ventriculogram which is essentially normal ejection fraction of 60%. Left 4 curve 5 French Judkins catheter was used to access the left coronary system which appeared to be essentially free of disease except for a 40% proximal LAD and a 60-70% ostial diagonal 1. 4 curved right Judkins catheters were used extra right coronary artery which is large and appeared to be free of disease. Aortic root shot which appears to be relatively normal and dilatation or aortic regurgitation sheath was removed and minx deployed patient was taken to the special holding area for recovery. Noncardiac source of his chest pain will need to be identified and evaluated.   Estimated blood loss <50 mL.  During this procedure  the patient was administered the following to achieve and maintain moderate conscious sedation: Versed 1 mg, Fentanyl 50 mcg, while the patient's heart rate, blood pressure, and oxygen saturation were continuously monitored. The period of conscious sedation was 34 minutes, of which I was present face-to-face 100% of this time.  Coronary Findings   Diagnostic  Dominance: Right  Left Anterior Descending  Ost LAD to Prox LAD lesion 40% stenosed  Not the culprit lesion. The lesion is type C, located at the bifurcation and irregular. The lesion is mildly calcified. The lesion was not previously treated. The stenosis was measured by a visual reading. Pressure wire/FFR was not performed on the lesion. IVUS was not performed on the lesion.  Mid LAD lesion 25% stenosed  Not the culprit lesion. The lesion is type B1 and located at the major branch. The lesion is mildly calcified. The lesion was not previously treated. The stenosis was measured by a visual reading. Pressure wire/FFR was not performed on the lesion. IVUS was not performed on the lesion.  First Diagonal Branch  Ost 1st Diag lesion 75% stenosed  Not the culprit lesion. The lesion is type C and located at the bifurcation. The lesion is mildly calcified. The lesion was not previously treated. The stenosis was measured by a visual reading. Pressure wire/FFR was not performed on the lesion. IVUS was not performed on the lesion.  Intervention   No interventions have been documented.  Left Heart   Left Ventricle The left ventricular size is normal. The left ventricular systolic function is normal. LV end diastolic pressure is normal. Calculated EF is 60%. No regional wall motion abnormalities.  Aorta Aortic Root: The aortic  root is normal. There is no aortic valve regurgitation. Sinus of Valsalva is normal. There is no significant gradient across the aortic valve.  Coronary Diagrams   Diagnostic Diagram       Implants    Vascular Products    Device Closure Mynxgrip 5f [phone] [id number] - Implanted    Inventory item: DEVICE CLOSURE MYNXGRIP 58F Model/Cat number: [id number]  Manufacturer: [employer] Lot number: [id number]  Device identifier: [id number] Device identifier type: GS1  Area Of Implantation: Femoral Artery    GUDID Information   Request status Request failed - device not in GUDID    As of 05/22/2016   Status: Implanted      MERGE Images   Show images for Cardiac catheterization   Link to Procedure Log   Procedure Log    Hemo Data 05/22/16 1337--05/22/16 1439 before discharge   AO Systolic Cath Pressure AO Diastolic Cath Pressure AO Mean Cath Pressure LV Systolic Cath Pressure LV End Diastolic  - - - 171 mmHg 12 mmHg  - - - 170 mmHg 19 mmHg  169 72 mmHg 112 mmHg - -  Order-Level Documents:   There are no order-level documents.  Encounter-Level Documents - 05/22/2016:   Scan on 05/25/2016 10:21 AM by Default, Provider, MD  Scan on 05/25/2016 10:20 AM by Default, Provider, MD  Scan on 05/25/2016 10:18 AM by Default, Provider, MD  Scan on 05/25/2016 10:09 AM by Default, Provider, MD  Document on 05/22/2016 2:46 PM by Ala Bent, RN: After Visit Summary  Document on 05/22/2016 2:46 PM by Ala Bent, RN: IP After Visit Summary  Scan on 05/22/2016 2:42 PM by Default, Provider, MD  Electronic signature on 05/22/2016 11:29 AM - Signed      Signed   Electronically signed by Alwyn Pea, MD on 05/22/16 at 1435 EDT  External Result Report   External Result Report

## 2018-04-26 ENCOUNTER — Ambulatory Visit
Admission: RE | Admit: 2018-04-26 | Discharge: 2018-04-26 | Disposition: A | Discharge status: Home / Self Care | Payer: Medicare PPO | Source: Ambulatory Visit | Attending: Urology | Admitting: Urology

## 2018-04-26 ENCOUNTER — Encounter
Admission: RE | Disposition: A | Discharge status: Home / Self Care | Payer: Self-pay | Source: Ambulatory Visit | Attending: Urology

## 2018-04-26 ENCOUNTER — Encounter: Payer: Self-pay | Admitting: Emergency Medicine

## 2018-04-26 ENCOUNTER — Ambulatory Visit: Payer: Medicare PPO | Admitting: Anesthesiology

## 2018-04-26 ENCOUNTER — Other Ambulatory Visit: Payer: Self-pay

## 2018-04-26 DIAGNOSIS — N403 Nodular prostate with lower urinary tract symptoms: Secondary | ICD-10-CM | POA: Insufficient documentation

## 2018-04-26 DIAGNOSIS — J449 Chronic obstructive pulmonary disease, unspecified: Secondary | ICD-10-CM | POA: Insufficient documentation

## 2018-04-26 DIAGNOSIS — N401 Enlarged prostate with lower urinary tract symptoms: Secondary | ICD-10-CM | POA: Insufficient documentation

## 2018-04-26 DIAGNOSIS — Z9981 Dependence on supplemental oxygen: Secondary | ICD-10-CM | POA: Diagnosis not present

## 2018-04-26 DIAGNOSIS — N138 Other obstructive and reflux uropathy: Secondary | ICD-10-CM

## 2018-04-26 DIAGNOSIS — Z79899 Other long term (current) drug therapy: Secondary | ICD-10-CM | POA: Diagnosis not present

## 2018-04-26 DIAGNOSIS — I2510 Atherosclerotic heart disease of native coronary artery without angina pectoris: Secondary | ICD-10-CM | POA: Insufficient documentation

## 2018-04-26 DIAGNOSIS — Z96611 Presence of right artificial shoulder joint: Secondary | ICD-10-CM | POA: Diagnosis not present

## 2018-04-26 DIAGNOSIS — R338 Other retention of urine: Secondary | ICD-10-CM | POA: Insufficient documentation

## 2018-04-26 DIAGNOSIS — I10 Essential (primary) hypertension: Secondary | ICD-10-CM | POA: Diagnosis not present

## 2018-04-26 DIAGNOSIS — Z87891 Personal history of nicotine dependence: Secondary | ICD-10-CM | POA: Insufficient documentation

## 2018-04-26 DIAGNOSIS — Z7952 Long term (current) use of systemic steroids: Secondary | ICD-10-CM | POA: Diagnosis not present

## 2018-04-26 DIAGNOSIS — Z96662 Presence of left artificial ankle joint: Secondary | ICD-10-CM | POA: Insufficient documentation

## 2018-04-26 DIAGNOSIS — Z7982 Long term (current) use of aspirin: Secondary | ICD-10-CM | POA: Diagnosis not present

## 2018-04-26 DIAGNOSIS — R339 Retention of urine, unspecified: Secondary | ICD-10-CM | POA: Diagnosis present

## 2018-04-26 HISTORY — PX: GREEN LIGHT LASER TURP (TRANSURETHRAL RESECTION OF PROSTATE: SHX6260

## 2018-04-26 SURGERY — GREEN LIGHT LASER TURP (TRANSURETHRAL RESECTION OF PROSTATE
Anesthesia: Choice

## 2018-04-26 MED ORDER — LIDOCAINE HCL (PF) 2 % IJ SOLN
INTRAMUSCULAR | Status: AC
  Filled 2018-04-26: qty 10

## 2018-04-26 MED ORDER — BELLADONNA ALKALOIDS-OPIUM 16.2-60 MG RE SUPP
RECTAL | Status: DC | PRN
  Administered 2018-04-26: 1 via RECTAL

## 2018-04-26 MED ORDER — EPHEDRINE SULFATE 50 MG/ML IJ SOLN
INTRAMUSCULAR | Status: DC | PRN
  Administered 2018-04-26 (×2): 10 mg via INTRAVENOUS

## 2018-04-26 MED ORDER — FENTANYL CITRATE (PF) 100 MCG/2ML IJ SOLN
INTRAMUSCULAR | Status: AC
  Filled 2018-04-26: qty 2

## 2018-04-26 MED ORDER — DEXAMETHASONE SODIUM PHOSPHATE 10 MG/ML IJ SOLN
INTRAMUSCULAR | Status: DC | PRN
  Administered 2018-04-26: 6 mg via INTRAVENOUS

## 2018-04-26 MED ORDER — CEFAZOLIN SODIUM-DEXTROSE 1-4 GM/50ML-% IV SOLN
1.0000 g | Freq: Once | INTRAVENOUS | Status: AC
  Administered 2018-04-26: 1 g via INTRAVENOUS

## 2018-04-26 MED ORDER — LIDOCAINE HCL URETHRAL/MUCOSAL 2 % EX GEL
CUTANEOUS | Status: AC
  Filled 2018-04-26: qty 10

## 2018-04-26 MED ORDER — LACTATED RINGERS IV SOLN
INTRAVENOUS | Status: DC
  Administered 2018-04-26: 12:00:00 via INTRAVENOUS

## 2018-04-26 MED ORDER — ACETAMINOPHEN-CODEINE #3 300-30 MG PO TABS: 1.0000 | ORAL_TABLET | ORAL | 1 refills | Status: DC | PRN

## 2018-04-26 MED ORDER — ONDANSETRON HCL 4 MG/2ML IJ SOLN
INTRAMUSCULAR | Status: AC
  Filled 2018-04-26: qty 2

## 2018-04-26 MED ORDER — DEXTROSE 5 % IV SOLN: 1000.0000 mg | Freq: Once | INTRAVENOUS | Status: DC

## 2018-04-26 MED ORDER — CEFAZOLIN SODIUM-DEXTROSE 1-4 GM/50ML-% IV SOLN
INTRAVENOUS | Status: AC
  Filled 2018-04-26: qty 50

## 2018-04-26 MED ORDER — PROPOFOL 10 MG/ML IV BOLUS
INTRAVENOUS | Status: DC | PRN
  Administered 2018-04-26: 120 mg via INTRAVENOUS

## 2018-04-26 MED ORDER — FAMOTIDINE 20 MG PO TABS
ORAL_TABLET | ORAL | Status: AC
  Administered 2018-04-26: 20 mg via ORAL
  Filled 2018-04-26: qty 1

## 2018-04-26 MED ORDER — CIPROFLOXACIN HCL 500 MG PO TABS: 500.0000 mg | ORAL_TABLET | Freq: Two times a day (BID) | ORAL | 0 refills | Status: DC

## 2018-04-26 MED ORDER — FENTANYL CITRATE (PF) 100 MCG/2ML IJ SOLN
INTRAMUSCULAR | Status: DC | PRN
  Administered 2018-04-26 (×3): 50 ug via INTRAVENOUS

## 2018-04-26 MED ORDER — ROCURONIUM BROMIDE 50 MG/5ML IV SOLN
INTRAVENOUS | Status: AC
  Filled 2018-04-26: qty 1

## 2018-04-26 MED ORDER — LIDOCAINE HCL (CARDIAC) PF 100 MG/5ML IV SOSY
PREFILLED_SYRINGE | INTRAVENOUS | Status: DC | PRN
  Administered 2018-04-26: 80 mg via INTRAVENOUS

## 2018-04-26 MED ORDER — PROPOFOL 10 MG/ML IV BOLUS
INTRAVENOUS | Status: AC
  Filled 2018-04-26: qty 20

## 2018-04-26 MED ORDER — BELLADONNA ALKALOIDS-OPIUM 16.2-60 MG RE SUPP
RECTAL | Status: AC
  Filled 2018-04-26: qty 1

## 2018-04-26 MED ORDER — ONDANSETRON HCL 4 MG/2ML IJ SOLN
INTRAMUSCULAR | Status: DC | PRN
  Administered 2018-04-26: 4 mg via INTRAVENOUS

## 2018-04-26 MED ORDER — URIBEL 118 MG PO CAPS: 1.0000 | ORAL_CAPSULE | Freq: Four times a day (QID) | ORAL | 3 refills | Status: DC | PRN

## 2018-04-26 MED ORDER — LIDOCAINE HCL URETHRAL/MUCOSAL 2 % EX GEL
CUTANEOUS | Status: DC | PRN
  Administered 2018-04-26: 1

## 2018-04-26 MED ORDER — FUROSEMIDE 10 MG/ML IJ SOLN: 10.0000 mg | Freq: Once | INTRAMUSCULAR | Status: DC

## 2018-04-26 MED ORDER — DEXAMETHASONE SODIUM PHOSPHATE 10 MG/ML IJ SOLN
INTRAMUSCULAR | Status: AC
  Filled 2018-04-26: qty 1

## 2018-04-26 MED ORDER — FAMOTIDINE 20 MG PO TABS
20.0000 mg | ORAL_TABLET | Freq: Once | ORAL | Status: AC
  Administered 2018-04-26: 20 mg via ORAL

## 2018-04-26 SURGICAL SUPPLY — 28 items
ADAPTER IRRIG TUBE 2 SPIKE SOL (ADAPTER) ×6 IMPLANT
BAG URINE DRAINAGE (UROLOGICAL SUPPLIES) ×3 IMPLANT
CATH FOLEY 2WAY  5CC 20FR SIL (CATHETERS) ×2
CATH FOLEY 2WAY 5CC 20FR SIL (CATHETERS) ×1 IMPLANT
CYSTOSCOPE CON FLOW (MISCELLANEOUS) ×2 IMPLANT
EVACUATOR AND FILTER SMOKE CO2 (MISCELLANEOUS) ×2 IMPLANT
GLOVE BIO SURGEON STRL SZ7.5 (GLOVE) ×7 IMPLANT
GOWN STRL REUS W/ TWL LRG LVL3 (GOWN DISPOSABLE) ×1 IMPLANT
GOWN STRL REUS W/ TWL XL LVL3 (GOWN DISPOSABLE) ×1 IMPLANT
GOWN STRL REUS W/TWL LRG LVL3 (GOWN DISPOSABLE) ×4
GOWN STRL REUS W/TWL XL LVL3 (GOWN DISPOSABLE) ×2
HOLDER FOLEY CATH W/STRAP (MISCELLANEOUS) ×2 IMPLANT
IV NS 1000ML (IV SOLUTION) ×2
IV NS 1000ML BAXH (IV SOLUTION) ×1 IMPLANT
IV SET PRIMARY 15D 139IN B9900 (IV SETS) ×3 IMPLANT
KIT TURNOVER CYSTO (KITS) ×3 IMPLANT
LASER GREENLIGHT XPS PROCEDURE (MISCELLANEOUS) ×2 IMPLANT
LASER GRNLGT MOXY FIBER 750UM (MISCELLANEOUS) ×2 IMPLANT
MoXy laser fiber ×2 IMPLANT
PACK CYSTO AR (MISCELLANEOUS) ×3 IMPLANT
SET IRRIG Y TYPE TUR BLADDER L (SET/KITS/TRAYS/PACK) ×3 IMPLANT
SOL .9 NS 3000ML IRR  AL (IV SOLUTION) ×14
SOL .9 NS 3000ML IRR UROMATIC (IV SOLUTION) ×4 IMPLANT
SURGILUBE 2OZ TUBE FLIPTOP (MISCELLANEOUS) ×3 IMPLANT
SYRINGE IRR TOOMEY STRL 70CC (SYRINGE) ×3 IMPLANT
TUBING CONNECTING 10 (TUBING) ×2 IMPLANT
TUBING CONNECTING 10' (TUBING) ×1
WATER STERILE IRR 1000ML POUR (IV SOLUTION) ×3 IMPLANT

## 2018-04-26 NOTE — Transfer of Care (Signed)
Immediate Anesthesia Transfer of Care Note  Patient: Caleb Khan  Procedure(s) Performed: Procedure(s): GREEN LIGHT LASER TURP (TRANSURETHRAL RESECTION OF PROSTATE (N/A)  Patient Location: PACU  Anesthesia Type:General  Level of Consciousness: sedated  Airway & Oxygen Therapy: Patient Spontanous Breathing and Patient connected to face mask oxygen  Post-op Assessment: Report given to RN and Post -op Vital signs reviewed and stable  Post vital signs: Reviewed and stable  Last Vitals:  Vitals:   04/26/18 1216 04/26/18 1553  BP: (!) 189/84 115/61  Pulse: 66 61  Resp: 17 11  Temp: 36.5 C (!) 36.2 C  SpO2: 98% 99%    Complications: No apparent anesthesia complications

## 2018-04-26 NOTE — Anesthesia Preprocedure Evaluation (Addendum)
Anesthesia Evaluation    Airway Mallampati: III       Dental   Pulmonary COPD, former smoker,           Cardiovascular hypertension, Pt. on medications      Neuro/Psych    GI/Hepatic   Endo/Other    Renal/GU Renal InsufficiencyRenal disease     Musculoskeletal   Abdominal   Peds  Hematology   Anesthesia Other Findings   Reproductive/Obstetrics                            Anesthesia Physical Anesthesia Plan  ASA: III  Anesthesia Plan:    Post-op Pain Management:    Induction:   PONV Risk Score and Plan:   Airway Management Planned:   Additional Equipment:   Intra-op Plan:   Post-operative Plan:   Informed Consent:   Plan Discussed with:   Anesthesia Plan Comments:         Anesthesia Quick Evaluation

## 2018-04-26 NOTE — Anesthesia Postprocedure Evaluation (Signed)
Anesthesia Post Note  Patient: Caleb Khan  Procedure(s) Performed: GREEN LIGHT LASER TURP (TRANSURETHRAL RESECTION OF PROSTATE (N/A )  Patient location during evaluation: PACU Anesthesia Type: General Level of consciousness: awake and alert Pain management: pain level controlled Vital Signs Assessment: post-procedure vital signs reviewed and stable Respiratory status: spontaneous breathing, nonlabored ventilation and respiratory function stable Cardiovascular status: blood pressure returned to baseline and stable Postop Assessment: no apparent nausea or vomiting Anesthetic complications: no     Last Vitals:  Vitals:   04/26/18 1634 04/26/18 1659  BP: 124/64 132/80  Pulse: 66 68  Resp: 16   Temp: (!) 35.9 C   SpO2: 93% 94%    Last Pain:  Vitals:   04/26/18 1659  TempSrc:   PainSc: 4                  Jovita Gamma

## 2018-04-26 NOTE — H&P (Signed)
Date of Initial H&P: 04/21/18  History reviewed, patient examined, no change in status, stable for surgery.  

## 2018-04-26 NOTE — Discharge Instructions (Addendum)
[facility] Prostate [facility] Green light laser therapy is a procedure that uses a high-energy laser to get rid of extra prostate tissue by turning the tissue into a vapor. It is less invasive than traditional methods of prostate surgery, which involve cutting out the prostate tissue. Because the tissue is turned into a vapor (vaporized) rather than cut out, there is generally less blood loss. This surgery is used to treat an enlarged prostate gland (benign prostatic hyperplasia). Tell a health care provider about:  Any allergies you have.  All medicines you are taking, including vitamins, herbs, eye drops, creams, and over-the-counter medicines.  Any problems you or family members have had with anesthetic medicines.  Any blood disorders you have.  Any surgeries you have had.  Any medical conditions you have. What are the risks? Generally, this is a safe procedure. However, problems may occur, including:  Infection.  Bleeding.  Allergic reaction to medicines.  Damage to other structures or organs.  Blood in the urine (hematuria).  Painful urination.  Urinary tract infection.  Erectile dysfunction (rare).  Dry ejaculation.  Scar tissue in the urinary passage.  What happens before the procedure? Staying hydrated Follow instructions from your health care provider about hydration, which may include:  Up to 2 hours before the procedure - you may continue to drink clear liquids, such as water, clear fruit juice, black coffee, and plain tea.  Eating and drinking restrictions Follow instructions from your health care provider about eating and drinking, which may include:  8 hours before the procedure - stop eating heavy meals or foods such as meat, fried foods, or fatty foods.  6 hours before the procedure - stop eating light meals or foods, such as toast or cereal.  6 hours before the procedure - stop drinking milk or drinks that contain milk.  2 hours before the  procedure - stop drinking clear liquids.  Medicines  Ask your health care provider about: ? Changing or stopping your regular medicines. This is especially important if you are taking diabetes medicines or blood thinners. ? Taking medicines such as aspirin and ibuprofen. These medicines can thin your blood. Do not take these medicines before your procedure if your health care provider instructs you not to.  You may be prescribed antibiotic medicine. If so, take your antibiotic as told by your health care provider. Do not stop taking the antibiotic even if you start to feel better. General instructions  Plan to have someone take you home from the hospital or clinic.  If you will be going home right after the procedure, plan to have someone with you for 24 hours. What happens during the procedure?  To reduce your risk of infection: ? Your health care team will wash or sanitize their hands. ? Your skin will be washed with soap.  You will be given one or more of the following: ? A medicine to help you relax (sedative). ? A medicine to make you fall asleep (general anesthetic). ? A medicine that is injected into your spine to numb the area below and slightly above the injection site (spinal anesthetic).  A tube containing viewing scopes and instruments (fiber-optic scope) will be inserted through your penis.  A thin fiber will be put through the tube and positioned next to the extra prostate tissue.  Pulses of laser light will come from the end of the fiber and be projected onto the extra tissue. Your blood will absorb the light, become hot, and vaporize the  extra prostate tissue.  The heat from the laser beam will seal off the blood vessels, which will lessen bleeding.  The fiber-optic scope will be removed and replaced with a temporary tube (catheter) that is used to help urine flow. The procedure may vary among health care providers and hospitals. What happens after the  procedure?  Your blood pressure, heart rate, breathing rate, and blood oxygen level will be monitored until the medicines you were given have worn off.  Depending on factors such as the amount of prostate tissue that was vaporized, the strength of your bladder, and the amount of bleeding expected, your catheter may be removed.  You may be given elastic support stockings to wear to help prevent blood clots in your legs.  Do not drive for 24 hours if you were given a sedative, or for as long as directed by your health care provider. Summary  Green light laser therapy is a procedure that uses a high-energy laser that turns extra prostate tissue into a vapor.  This procedure is less invasive than traditional methods of prostate surgery.  Follow instructions from your health care provider about eating and drinking before the procedure.  Pulses of laser light will come from the end of a thin fiber and be aimed at the extra prostate tissue. Your blood will absorb the light, become hot, and vaporize the extra tissue. This information is not intended to replace advice given to you by your health care provider. Make sure you discuss any questions you have with your health care provider. Document Released: 10/20/2007 Document Revised: 08/01/2016 Document Reviewed: 08/01/2016 Elsevier Interactive Patient Education  2017 [employer]  Indwelling Urinary Catheter Care, Adult Take good care of your catheter to keep it working and to prevent problems. How to wear your catheter Attach your catheter to your leg with tape (adhesive tape) or a leg strap. Make sure it is not too tight. If you use tape, remove any bits of tape that are already on the catheter. How to wear a drainage bag You should have:  A large overnight bag.  A small leg bag.  Overnight Bag You may wear the overnight bag at any time. Always keep the bag below the level of your bladder but off the floor. When you sleep, put a clean  plastic bag in a wastebasket. Then hang the bag inside the wastebasket. Leg Bag Never wear the leg bag at night. Always wear the leg bag below your knee. Keep the leg bag secure with a leg strap or tape. How to care for your skin  Clean the skin around the catheter at least once every day.  Shower every day. Do not take baths.  Put creams, lotions, or ointments on your genital area only as told by your doctor.  Do not use powders, sprays, or lotions on your genital area. How to clean your catheter and your skin 1. Wash your hands with soap and water. 2. Wet a washcloth in warm water and gentle (mild) soap. 3. Use the washcloth to clean the skin where the catheter enters your body. Clean downward and wipe away from the catheter in small circles. Do not wipe toward the catheter. 4. Pat the area dry with a clean towel. Make sure to clean off all soap. How to care for your drainage bags Empty your drainage bag when it is ?- full or at least 2-3 times a day. Replace your drainage bag once a month or sooner if it starts to  smell bad or look dirty. Do not clean your drainage bag unless told by your doctor. Emptying a drainage bag  Supplies Needed  Rubbing alcohol.  Gauze pad or cotton ball.  Tape or a leg strap.  Steps 1. Wash your hands with soap and water. 2. Separate (detach) the bag from your leg. 3. Hold the bag over the toilet or a clean container. Keep the bag below your hips and bladder. This stops pee (urine) from going back into the tube. 4. Open the pour spout at the bottom of the bag. 5. Empty the pee into the toilet or container. Do not let the pour spout touch any surface. 6. Put rubbing alcohol on a gauze pad or cotton ball. 7. Use the gauze pad or cotton ball to clean the pour spout. 8. Close the pour spout. 9. Attach the bag to your leg with tape or a leg strap. 10. Wash your hands.  Changing a drainage bag Supplies Needed  Alcohol wipes.  A clean drainage  bag.  Adhesive tape or a leg strap.  Steps 1. Wash your hands with soap and water. 2. Separate the dirty bag from your leg. 3. Pinch the rubber catheter with your fingers so that pee does not spill out. 4. Separate the catheter tube from the drainage tube where these tubes connect (at the connection valve). Do not let the tubes touch any surface. 5. Clean the end of the catheter tube with an alcohol wipe. Use a different alcohol wipe to clean the end of the drainage tube. 6. Connect the catheter tube to the drainage tube of the clean bag. 7. Attach the new bag to the leg with adhesive tape or a leg strap. 8. Wash your hands.  How to prevent infection and other problems  Never pull on your catheter or try to remove it. Pulling can damage tissue in your body.  Always wash your hands before and after touching your catheter.  If a leg strap gets wet, replace it with a dry one.  Drink enough fluids to keep your pee clear or pale yellow, or as told by your doctor.  Do not let the drainage bag or tubing touch the floor.  Wear cotton underwear.  If you are male, wipe from front to back after you poop (have a bowel movement).  Check on the catheter often to make sure it works and the tubing is not twisted. Get help if:  Your pee is cloudy.  Your pee smells unusually bad.  Your pee is not draining into the bag.  Your tube gets clogged.  Your catheter starts to leak.  Your bladder feels full. Get help right away if:  You have redness, swelling, or pain where the catheter enters your body.  You have fluid, pus, or a bad smell coming from the area where the catheter enters your body.  The area where the catheter enters your body feels warm.  You have a fever.  You have pain in your: ? Stomach (abdomen). ? Legs. ? Lower back. ? Bladder.  You see blood fill the catheter.  Your pee is pink or red.  You feel sick to your stomach (nauseous).  You throw up  (vomit).  You have chills.  Your catheter gets pulled out. This information is not intended to replace advice given to you by your health care provider. Make sure you discuss any questions you have with your health care provider. Document Released: 11/07/2012 Document Revised: 06/10/2016 Document Reviewed: 12/26/2013  Elsevier Interactive Patient Education  [employer].  Prostate Laser Surgery, Care After This sheet gives you information about how to care for yourself after your procedure. Your health care provider may also give you more specific instructions. If you have problems or questions, contact your health care provider. What can I expect after the procedure? For the first few weeks after the procedure:  You will feel a need to urinate often.  You may have blood in your urine.  You may feel a sudden need to urinate.  Once your urinary catheter is removed, you may have a burning feeling when you urinate, especially at the end of urination. This feeling usually passes within 3-5 days. Follow these instructions at home: Activity  Return to your normal activities as told by your health care provider. Ask your health care provider what activities are safe for you.  Do not do vigorous exercise for 1 week or as told by your health care provider.  Do not lift anything that is heavier than 10 lb (4.5 kg) until your health care provider say it is safe.  Avoid sexual activity for 4-6 weeks or as told by your health care provider.  Do not ride in a car for extended periods of time for 1 month or as told by your health care provider.  Do not drive for 24 hours if you were given a medicine to help you relax (sedative). Diet  Eat foods that are high in fiber, such as fresh fruits and vegetables, whole grains, and beans.  Drink enough fluid to keep your urine clear or pale yellow. Medicines  Take over-the-counter and prescription medicines, including stool softeners, only as  told by your health care provider.  If you were prescribed an antibiotic medicine, take it as told by your health care provider. Do not stop taking the antibiotic even if you start to feel better. General instructions  If you were given elastic support stockings, wear them as told by your health care provider.  Do not strain to have a bowel movement. Straining may lead to bleeding from the prostate and cause clots to form and cause trouble urinating.  Keep all follow-up visits as told by your health care provider. This is important. Contact a health care provider if:  You have a fever or chills.  You have spasms or pain with the urinary catheter still in place.  Once the catheter has been removed, you experience difficulty starting your stream when attempting to urinate. Get help right away if:  There is a blockage in your catheter.  Your catheter has been removed and you are suddenly unable to urinate.  Your urine smells unusually bad.  You start to have blood clots in your urine.  The blood in your urine becomes persistent or gets thick.  You develop chest pains.  You develop shortness of breath.  You develop swelling or pain in your leg. Summary  You may notice urinary symptoms for a few weeks after your procedure.  Follow instructions from your health care provider regarding activity restrictions such as lifting, exercise, and sexual activity.  Contact your health care provider if you have any unusual symptoms during your recovery. This information is not intended to replace advice given to you by your health care provider. Make sure you discuss any questions you have with your health care provider. Document Released: 07/13/2005 Document Revised: 02/28/2016 Document Reviewed: 02/28/2016 Elsevier Interactive Patient Education  2018 [employer].  AMBULATORY SURGERY  DISCHARGE INSTRUCTIONS  1) The drugs that you were given will stay in your system until tomorrow  so for the next 24 hours you should not:  A) Drive an automobile B) Make any legal decisions C) Drink any alcoholic beverage   2) You may resume regular meals tomorrow.  Today it is better to start with liquids and gradually work up to solid foods.  You may eat anything you prefer, but it is better to start with liquids, then soup and crackers, and gradually work up to solid foods.   3) Please notify your doctor immediately if you have any unusual bleeding, trouble breathing, redness and pain at the surgery site, drainage, fever, or pain not relieved by medication.    4) Additional Instructions:        Please contact your physician with any problems or Same Day Surgery at [phone], Monday through Friday 6 am to 4 pm, or [place] at [facility] number at [phone].

## 2018-04-26 NOTE — Op Note (Signed)
Preoperative diagnosis: 1.  BPH with bladder outlet obstruction                                             2.  Urinary retention  Postoperative diagnosis: Same  Procedure: Photo vaporization of the prostate with greenlight laser  Surgeon: Suszanne Conners. Evelene Croon MD  Anesthesia: General  Indications:See the history and physical. After informed consent the above procedure(s) were requested     Technique and findings: After adequate general anesthesia had been obtained the patient was placed into dorsal lithotomy position and the perineum was prepped and draped in the usual fashion.  The laser scope was coupled to the camera and then visually advanced into the bladder.  The patient had trilobar BPH with visual obstruction.  The bladder was moderately trabeculated.  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  Both ureteral orifices were identified and had clear efflux.  The greenlight XPS laser fiber was then passed through the scope and power set at 80 W.  Bladder neck and median lobe tissue was vaporized.  Power was increased to 120 W and obstructing tissue from the bladder neck to the veru montanum was vaporized.  Power was increased to 180 W and remaining obstructive tissue vaporized.  Bleeders were then controlled with the coagulation setting.  The laser scope was then removed and 10 cc of viscous Xylocaine instilled within the urethra.  A 20 French silicone catheter was placed and irrigated until clear.  A B&O suppository was placed.  Blood loss was less than 100 cc.  The patient tolerated the procedure well and was transferred to the recovery room in stable condition.

## 2018-04-26 NOTE — Progress Notes (Signed)
Pt and wife have very good knowledge of foley care as they have done this before

## 2018-04-26 NOTE — Anesthesia Post-op Follow-up Note (Signed)
Anesthesia QCDR form completed.        

## 2018-04-26 NOTE — Anesthesia Procedure Notes (Signed)
Procedure Name: LMA Insertion Date/Time: 04/26/2018 1:35 PM Performed by: Stormy Fabian, CRNA Pre-anesthesia Checklist: Patient identified, Patient being monitored, Timeout performed, Emergency Drugs available and Suction available Patient Re-evaluated:Patient Re-evaluated prior to induction Oxygen Delivery Method: Circle system utilized Preoxygenation: Pre-oxygenation with 100% oxygen Induction Type: IV induction Ventilation: Mask ventilation without difficulty LMA: LMA inserted LMA Size: 5.0 Tube type: Oral Number of attempts: 1 Placement Confirmation: positive ETCO2 and breath sounds checked- equal and bilateral Tube secured with: Tape Dental Injury: Teeth and Oropharynx as per pre-operative assessment

## 2018-04-27 ENCOUNTER — Encounter: Payer: Self-pay | Admitting: Urology

## 2018-06-03 ENCOUNTER — Encounter: Payer: Self-pay | Admitting: Emergency Medicine

## 2018-06-03 ENCOUNTER — Emergency Department: Payer: Medicare PPO

## 2018-06-03 ENCOUNTER — Other Ambulatory Visit: Payer: Self-pay

## 2018-06-03 ENCOUNTER — Emergency Department
Admission: EM | Admit: 2018-06-03 | Discharge: 2018-06-03 | Disposition: A | Discharge status: Home / Self Care | Payer: Medicare PPO | Attending: Emergency Medicine | Admitting: Emergency Medicine

## 2018-06-03 DIAGNOSIS — J449 Chronic obstructive pulmonary disease, unspecified: Secondary | ICD-10-CM | POA: Diagnosis not present

## 2018-06-03 DIAGNOSIS — I2510 Atherosclerotic heart disease of native coronary artery without angina pectoris: Secondary | ICD-10-CM | POA: Insufficient documentation

## 2018-06-03 DIAGNOSIS — Y9389 Activity, other specified: Secondary | ICD-10-CM | POA: Diagnosis not present

## 2018-06-03 DIAGNOSIS — Z79899 Other long term (current) drug therapy: Secondary | ICD-10-CM | POA: Diagnosis not present

## 2018-06-03 DIAGNOSIS — N183 Chronic kidney disease, stage 3 (moderate): Secondary | ICD-10-CM | POA: Insufficient documentation

## 2018-06-03 DIAGNOSIS — Z96611 Presence of right artificial shoulder joint: Secondary | ICD-10-CM | POA: Insufficient documentation

## 2018-06-03 DIAGNOSIS — Z7982 Long term (current) use of aspirin: Secondary | ICD-10-CM | POA: Insufficient documentation

## 2018-06-03 DIAGNOSIS — Z96662 Presence of left artificial ankle joint: Secondary | ICD-10-CM | POA: Insufficient documentation

## 2018-06-03 DIAGNOSIS — Y929 Unspecified place or not applicable: Secondary | ICD-10-CM | POA: Diagnosis not present

## 2018-06-03 DIAGNOSIS — Y998 Other external cause status: Secondary | ICD-10-CM | POA: Diagnosis not present

## 2018-06-03 DIAGNOSIS — S63512A Sprain of carpal joint of left wrist, initial encounter: Secondary | ICD-10-CM | POA: Insufficient documentation

## 2018-06-03 DIAGNOSIS — S638X2A Sprain of other part of left wrist and hand, initial encounter: Secondary | ICD-10-CM

## 2018-06-03 DIAGNOSIS — W010XXA Fall on same level from slipping, tripping and stumbling without subsequent striking against object, initial encounter: Secondary | ICD-10-CM | POA: Insufficient documentation

## 2018-06-03 DIAGNOSIS — S6992XA Unspecified injury of left wrist, hand and finger(s), initial encounter: Secondary | ICD-10-CM | POA: Diagnosis present

## 2018-06-03 DIAGNOSIS — I129 Hypertensive chronic kidney disease with stage 1 through stage 4 chronic kidney disease, or unspecified chronic kidney disease: Secondary | ICD-10-CM | POA: Diagnosis not present

## 2018-06-03 DIAGNOSIS — Z87891 Personal history of nicotine dependence: Secondary | ICD-10-CM | POA: Insufficient documentation

## 2018-06-03 HISTORY — DX: Spinal stenosis, site unspecified: M48.00

## 2018-06-03 MED ORDER — OXYCODONE-ACETAMINOPHEN 5-325 MG PO TABS
1.0000 | ORAL_TABLET | Freq: Once | ORAL | Status: AC
  Administered 2018-06-03: 1 via ORAL
  Filled 2018-06-03: qty 1

## 2018-06-03 MED ORDER — TRAMADOL-ACETAMINOPHEN 37.5-325 MG PO TABS: 1.0000 | ORAL_TABLET | Freq: Four times a day (QID) | ORAL | 0 refills | Status: DC | PRN

## 2018-06-03 NOTE — ED Provider Notes (Signed)
[facility] Emergency Department Provider Note   ____________________________________________   First MD Initiated Contact with Patient 06/03/18 [phone]     (approximate)  I have reviewed the triage vital signs and the nursing notes.   HISTORY  Chief Complaint Fall    HPI Caleb Khan is a 84 y.o. male patient complain of left wrist pain secondary to a fall.  Patient state he lost his balance and fell break and fall with the left hand.  Patient denies loss sensation.  Patient state still have range of motion but his pain increased with flexion and extension of the wrist.  Patient rates the pain as 8/10.  No palliative measures prior to arrival.  Past Medical History:  Diagnosis Date  . Anemia   . Anginal pain (HCC)   . Arthritis    osteoarthritis  . Chronic kidney disease    stage 3  . COPD (chronic obstructive pulmonary disease) (HCC)    NO INHALERS  . Coronary artery disease   . Dyspnea    with exertion  . Hypertension   . Polymyalgia rheumatica (HCC)    TAKES PREDNISONE  . Spinal stenosis     Patient Active Problem List   Diagnosis Date Noted  . Sepsis (HCC) 03/17/2018    Past Surgical History:  Procedure Laterality Date  . CARDIAC CATHETERIZATION Left 05/22/2016   Procedure: Left Heart Cath and Coronary Angiography;  Surgeon: Alwyn Pea, MD;  Location: [facility] INVASIVE CV LAB;  Service: Cardiovascular;  Laterality: Left;  . EYE SURGERY Bilateral    Cataract Extraction with IOL  . GREEN LIGHT LASER TURP (TRANSURETHRAL RESECTION OF PROSTATE N/A 04/26/2018   Procedure: GREEN LIGHT LASER TURP (TRANSURETHRAL RESECTION OF PROSTATE;  Surgeon: Orson Ape, MD;  Location: [facility] ORS;  Service: Urology;  Laterality: N/A;  . JOINT REPLACEMENT Right 2004   Shoulder Replacement  . JOINT REPLACEMENT Left 2007   Ankle Replacement  . PATELLAR TENDON REPAIR Left 07/15/2017   Procedure: PATELLA TENDON REPAIR;  Surgeon: Kennedy Bucker, MD;   Location: [facility] ORS;  Service: Orthopedics;  Laterality: Left;  Marland Kitchen QUADRICEPS TENDON REPAIR Left 07/15/2017   Procedure: REPAIR QUADRICEP TENDON;  Surgeon: Kennedy Bucker, MD;  Location: [facility] ORS;  Service: Orthopedics;  Laterality: Left;  . TONSILLECTOMY    . VASECTOMY      Prior to Admission medications   Medication Sig Start Date End Date Taking? Authorizing Provider  acetaminophen-codeine (TYLENOL #3) 300-30 MG tablet Take 1 tablet by mouth every 3 (three) hours as needed for moderate pain. 04/26/18   Orson Ape, MD  amLODipine (NORVASC) 5 MG tablet Take 5 mg by mouth every morning.     [provider]  aspirin EC 81 MG tablet Take 81 mg by mouth daily.    [provider]  Calcium Carb-Cholecalciferol (CALCIUM 500+D3) 500-400 MG-UNIT TABS Take 1 tablet by mouth daily.    [provider]  ciprofloxacin (CIPRO) 500 MG tablet Take 1 tablet (500 mg total) by mouth 2 (two) times daily. 04/26/18   Orson Ape, MD  docusate sodium (COLACE) 100 MG capsule Take 100-200 mg by mouth every evening.     [provider]  finasteride (PROSCAR) 5 MG tablet Take 1 tablet (5 mg total) by mouth daily. Patient taking differently: Take 5 mg by mouth every evening.  03/20/18   Enid Baas, MD  Meth-Hyo-M Bl-Na Phos-Ph Sal (URIBEL) 118 MG CAPS Take 1 capsule (118 mg total) by mouth every 6 (  six) hours as needed (dysuria). 04/26/18   Orson Ape, MD  Multiple Vitamins-Minerals (MULTIVITAMIN PO) Take 1 tablet by mouth daily.    [provider]  nitroGLYCERIN (NITROSTAT) 0.4 MG SL tablet Place 0.4 mg under the tongue every 5 (five) minutes as needed for chest pain.    [provider]  oxybutynin (DITROPAN) 5 MG tablet Take 1 tablet (5 mg total) by mouth every 8 (eight) hours as needed for bladder spasms. Patient not taking: Reported on 04/14/2018 03/19/18   Enid Baas, MD  predniSONE (DELTASONE) 10 MG tablet Take 10 mg by mouth daily with  breakfast.    [provider]  Red Yeast Rice 600 MG CAPS Take 1,200 mg by mouth 2 (two) times daily.    [provider]  tamsulosin (FLOMAX) 0.4 MG CAPS capsule Take 0.4 mg by mouth daily after breakfast.    [provider]  traMADol-acetaminophen (ULTRACET) 37.5-325 MG tablet Take 1 tablet by mouth every 6 (six) hours as needed. 06/03/18   Joni Reining, PA-C  zolpidem (AMBIEN) 5 MG tablet Take 5 mg by mouth at bedtime as needed for sleep.    [provider]    Allergies Benazepril  Family History  Problem Relation Age of Onset  . Hypertension Mother     Social History Social History   Tobacco Use  . Smoking status: Former Smoker    Packs/day: 2.00    Years: 22.00    Pack years: 44.00    Types: Cigarettes    Last attempt to quit: 05/22/1972    Years since quitting: 46.0  . Smokeless tobacco: Never Used  Substance Use Topics  . Alcohol use: Yes    Alcohol/week: 1.0 standard drinks    Types: 1 Glasses of wine per week    Comment: daily  . Drug use: No    Review of Systems  Constitutional: No fever/chills Eyes: No visual changes. ENT: No sore throat. Cardiovascular: Denies chest pain. Respiratory: Denies shortness of breath. Gastrointestinal: No abdominal pain.  No nausea, no vomiting.  No diarrhea.  No constipation. Genitourinary: Negative for dysuria. Musculoskeletal: Left wrist pain. Skin: Negative for rash. Neurological: Negative for headaches, focal weakness or numbness. Endocrine:Hypertension. Allergic/Immunilogical: Benazepril ____________________________________________   PHYSICAL EXAM:  VITAL SIGNS: ED Triage Vitals  Enc Vitals Group     BP 06/03/18 0801 (!) 155/84     Pulse Rate 06/03/18 0801 67     Resp 06/03/18 0801 16     Temp 06/03/18 0805 97.8 F (36.6 C)     Temp Source 06/03/18 0801 Oral     SpO2 06/03/18 0801 100 %     Weight 06/03/18 0801 147 lb 7.8 oz (66.9 kg)     Height --      Head  Circumference --      Peak Flow --      Pain Score 06/03/18 0802 8     Pain Loc --      Pain Edu? --      Excl. in GC? --    Constitutional: Alert and oriented. Well appearing and in no acute distress. Hematological/Lymphatic/Immunilogical: No cervical lymphadenopathy. Cardiovascular: Normal rate, regular rhythm. Grossly normal heart sounds.  Good peripheral circulation. Respiratory: Normal respiratory effort.  No retractions. Lungs CTAB. Gastrointestinal: Soft and nontender. No distention. No abdominal bruits. No CVA tenderness. Musculoskeletal: No lower extremity tenderness nor edema.  No joint effusions. Neurologic:  Normal speech and language. No gross focal neurologic deficits are appreciated. No gait  instability. Skin:  Skin is warm, dry and intact. No rash noted. Psychiatric: Mood and affect are normal. Speech and behavior are normal.  ____________________________________________   LABS (all labs ordered are listed, but only abnormal results are displayed)  Labs Reviewed - No data to display ____________________________________________  EKG   ____________________________________________  RADIOLOGY  ED MD interpretation:    Official radiology report(s): Dg Wrist Complete Left  Result Date: 06/03/2018 CLINICAL DATA:  Pain and swelling of the left wrist secondary to a fall. EXAM: LEFT WRIST - COMPLETE 3+ VIEW COMPARISON:  12/17/2017 FINDINGS: There is no acute fracture or dislocation. 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 There is severe chronic arthritis and subluxation at the first [facility] joint. Chondrocalcinosis at the radiocarpal joint. On the lateral view there is evidence of old avulsions of the dorsal aspect of the triquetrum. There is soft tissue swelling at the wrist with a probable radiocarpal joint effusion. IMPRESSION: Soft tissue swelling and probable joint effusion at the wrist. No acute bone abnormality. Chronic bone  changes with no significant since the prior study of 12/17/2017. Electronically Signed   By: Francene Boyers M.D.   On: 06/03/2018 09:07    ____________________________________________   PROCEDURES  Procedure(s) performed: None  Procedures  Critical Care performed: No  ____________________________________________   INITIAL IMPRESSION / ASSESSMENT AND PLAN / ED COURSE  As part of my medical decision making, I reviewed the following data within the electronic MEDICAL RECORD NUMBER    Left wrist pain secondary to fall.  Discussed x-ray findings patient's revealing no fracture.  Patient placed in a volar splint and given discharge care instruction.  Patient advised follow-up PCP as needed.      ____________________________________________   FINAL CLINICAL IMPRESSION(S) / ED DIAGNOSES  Final diagnoses:  Sprain of midcarpal joint of left wrist, initial encounter     ED Discharge Orders         Ordered    traMADol-acetaminophen (ULTRACET) 37.5-325 MG tablet  Every 6 hours PRN     06/03/18 0925           Note:  This document was prepared using Dragon voice recognition software and may include unintentional dictation errors.    Joni Reining, PA-C 06/03/18 1610    Jene Every, MD 06/03/18 1002

## 2018-06-03 NOTE — ED Triage Notes (Signed)
Pt to ED via POV c/o fall. Pt states that he lost his balance and fell, pt caught himself with his hands. Pt is now having swelling to the left wrist. Pt is able to move extremity but it is painful. Color in extremity is WDL, pulse is present.

## 2018-06-03 NOTE — Discharge Instructions (Addendum)
Wear wrist splint for 3 to 5 days as needed. 

## 2018-07-12 ENCOUNTER — Ambulatory Visit: Payer: Medicare PPO | Attending: Urology | Admitting: Physical Therapy

## 2018-07-12 ENCOUNTER — Other Ambulatory Visit: Payer: Self-pay

## 2018-07-12 DIAGNOSIS — M25552 Pain in left hip: Secondary | ICD-10-CM | POA: Insufficient documentation

## 2018-07-12 DIAGNOSIS — M6208 Separation of muscle (nontraumatic), other site: Secondary | ICD-10-CM | POA: Diagnosis present

## 2018-07-12 DIAGNOSIS — R278 Other lack of coordination: Secondary | ICD-10-CM | POA: Insufficient documentation

## 2018-07-12 DIAGNOSIS — G8929 Other chronic pain: Secondary | ICD-10-CM | POA: Diagnosis present

## 2018-07-12 DIAGNOSIS — M545 Low back pain: Secondary | ICD-10-CM | POA: Insufficient documentation

## 2018-07-12 DIAGNOSIS — R2689 Other abnormalities of gait and mobility: Secondary | ICD-10-CM | POA: Diagnosis present

## 2018-07-12 DIAGNOSIS — M25551 Pain in right hip: Secondary | ICD-10-CM | POA: Insufficient documentation

## 2018-07-12 DIAGNOSIS — M4125 Other idiopathic scoliosis, thoracolumbar region: Secondary | ICD-10-CM | POA: Diagnosis present

## 2018-07-12 NOTE — Patient Instructions (Addendum)
  Proper body mechanics with getting out of a chair to decrease strain  on back &pelvic floor   Avoid holding your breath when Getting out of the chair:  Scoot to front part of chair chair Heels behind feet, feet are hip width apart, nose over toes  Inhale like you are smelling roses Exhale to stand    ______   Avoid straining pelvic floor, abdominal muscles , spine  Use log rolling technique instead of getting out of bed with your neck or the sit-up   Log rolling into and out of .bed  With sidelying with arm over head, roll onto armpit  position first   ________  Wear shoe lift in R   Use cane to press into ground  While walking   __________ When sitting, place feet on the ground under knees

## 2018-07-13 NOTE — Therapy (Addendum)
[facility] [address] [place], [place], [address] Phone: [phone]   Fax:  [phone]  Physical Therapy Evaluation  Patient Details  Name: Caleb Khan MRN: [id number] Date of Birth: May 13, 1928 No data recorded  Encounter Date: 07/12/2018    Past Medical History:  Diagnosis Date  . Anemia   . Anginal pain (HCC)   . Arthritis    osteoarthritis  . Chronic kidney disease    stage 3  . COPD (chronic obstructive pulmonary disease) (HCC)    NO INHALERS  . Coronary artery disease   . Dyspnea    with exertion  . Hypertension   . Polymyalgia rheumatica (HCC)    TAKES PREDNISONE  . Spinal stenosis     Past Surgical History:  Procedure Laterality Date  . CARDIAC CATHETERIZATION Left 05/22/2016   Procedure: Left Heart Cath and Coronary Angiography;  Surgeon: Alwyn Peawayne D Callwood, MD;  Location: [facility] INVASIVE CV LAB;  Service: Cardiovascular;  Laterality: Left;  . EYE SURGERY Bilateral    Cataract Extraction with IOL  . GREEN LIGHT LASER TURP (TRANSURETHRAL RESECTION OF PROSTATE N/A 04/26/2018   Procedure: GREEN LIGHT LASER TURP (TRANSURETHRAL RESECTION OF PROSTATE;  Surgeon: Orson ApeWolff, Michael R, MD;  Location: [facility] ORS;  Service: Urology;  Laterality: N/A;  . JOINT REPLACEMENT Right 2004   Shoulder Replacement  . JOINT REPLACEMENT Left 2007   Ankle Replacement  . PATELLAR TENDON REPAIR Left 07/15/2017   Procedure: PATELLA TENDON REPAIR;  Surgeon: Kennedy BuckerMenz, Michael, MD;  Location: [facility] ORS;  Service: Orthopedics;  Laterality: Left;  Marland Kitchen. QUADRICEPS TENDON REPAIR Left 07/15/2017   Procedure: REPAIR QUADRICEP TENDON;  Surgeon: Kennedy BuckerMenz, Michael, MD;  Location: [facility] ORS;  Service: Orthopedics;  Laterality: Left;  . TONSILLECTOMY    . VASECTOMY      There were no vitals filed for this visit.   Subjective Assessment - 07/13/18 1817    Subjective 1) Urinary incontinence. Pt began treatment for difficulty urinating , incomplete  urination, and frequent urination at night 3-4 x summer 2019. Pt had no leakage issue at this time before seeking treatment.  Pt began with indwelling catheter and got a UTI and was hospitalized. Pt then tried green light laser treatment to enlarge passage of prostrate. Pt wore indwelling catheter for 1 week and then switched to external catheters during the day and wears Depends ( 1x/ night)  at night. This lasted for over a month. For the last two weeks, pt noticed he gained better control of urine. During the day, pt wears a Depends and leaks 1 oz a day. Pt no longer wears a cathether.   Pt still has frequent urination at night and wants to not wear Depends during the day. Pt notices leakage with strenuous activities, sit to stand and picking something up from the floor.      2) LBP 2/2/ Spinal stenosis and scoliosis:  This year pt was referred to Dr. Rosita KeaMenz (orthopedist) and he referred pt to a neurosurgeon. Pt learned that he is not a good candidate for surgery and was recommended PT.  Pt has gone to 2 sessions of PT at [facility]. Pt goes through his exercises and he feels tired but they have not improved his comfort level. Pt feels pain shooting down the posterior mid thigh.  Pt stretches to relieves the pain by 70-80% which lasts until he sits down again.  8/10 at worst, 2/10 at its best.     3)Hip pain 2/2/ Spinal stenosis  and scoliosis:  Pt feels the hip pain more consistently in the back of the hips with standing and walking. 7/10 at worst. Tynenol relieves 80%.  Physical activities: does not play golf anymore due to leg strength. plays pool, bridge. walk 1/2 miles but pt feels fatigue and his legs feel fatigue with shortness of breath sometimes when walking moderate hills. Pt would like to get back to canoing to retrieving golf balls but he does not feel stable any more.  Pt enjoys reading.          Pertinent History  Pt has been screened for OSA but has not undergone sleep study. Pt's pulmonologist  has nighttime oxygen ( 2L per min)  10 months ago and was recommended to lose 10 lbs. Pt has lost 20 lbs. Pt 's wife tells pt that pt no longer snores and grunts at night after being put on oxgen. Pt is still getting 3-4 x / night to go to the bathroom.      Patient Stated Goals   wants to not wear Depends during the day                      Objective measurements completed on examination: See above findings.    Pelvic Floor Special Questions - 07/13/18 1817    Diastasis Recti  3 fingers below sternum     External Perineal Exam  plan to assess next session       Caleb Khan - 07/13/18 1815      Bed Mobility   Bed Mobility  --   headlift. crunch     Transfers   Five time sit to stand comments   to assess next session    Comments  poor alignment, no use of BUE, difficulty, straining. breathholding      Ambulation/Gait   Gait Comments  R trunk lean, decreased L stance, SPC cane in R hand  ( with shoe lift in R shoe, equal stance phase B).  Cane adjusted higher to promote lengthening of R trunk.       Neuro Re-ed    Neuro Re-ed Details   see pt instructions            PT Long Term Goals - 07/12/18 0850      PT LONG TERM GOAL #1   Title  Pt will decrease wearing Depends from 1 per day to 0 per day in order to improve QOL     Time  12    Period  Weeks    Status  New    Target Date  10/04/18      PT LONG TERM GOAL #2   Title  Pt will be able to initiate urination when standing from 10 sec to < 5 sec in order to improve pelvic floor function    Time  5    Period  Weeks    Status  New    Target Date  08/16/18      PT LONG TERM GOAL #3   Title  Pt will demo proper pelvic floor coordination with diaphragmatic breathing in order to promote pelvic function and postural stability for balance    Time  8    Period  Weeks    Status  New    Target Date  09/06/18      PT LONG TERM GOAL #4   Title  Pt demo equal alignment of pelvic girdle,  demo less gait deviations ( Less  R trunk lean, more equal stance phase) in order to progress to longer walking endurance    Time  2    Period  Weeks    Status  New    Target Date  07/26/18      PT LONG TERM GOAL #5   Title  Pt will decrease his PSQI score from 16% to < 10% in order to improve sleep and indication of  less trips to use the bathroom    Time  12    Period  Weeks    Status  New    Target Date  10/09/18      Additional Long Term Goals   Additional Long Term Goals  Yes      PT LONG TERM GOAL #6   Title  Pt will decrease PFDI -20 from 35% to < 17% in order to indicate improved pelvic floor control     Time  12    Period  Weeks    Status  New    Target Date  10/09/18      PT LONG TERM GOAL #7   Title  Pt will decrease ODI from 32% to < 16% to improve postural stability and walking /standing     Time  12    Period  Weeks    Status  New    Target Date  10/09/18                    Plan - 07/13/18 1816    Clinical Impression Statement  Pt is an 84 yo male who reports of SUI 2/2 to urological procedures and long-standing frequent night-time urination. Pt also reports LBP and hip pain  2/2 spinal stenosis and scoliosis.  These deficits impact his QOL and ADLs.  PT assessment showed thoracolumbar scoliosis, diastasis recti, dyscoordination and strength of pelvic floor mm, weak hip weakness, poor body mechanics which places strain on the abdominal/pelvic floor mm, leg length difference, an pelvic obliquties. These are deficits that indicate an ineffective intraabdominal pressure system associated with stress urinary leakage and associated scoliosis related musculoskeletal deficits.  Plan to consider co-morbidities ( CAD, COPD, and other conditions)  into POC.   Pt was provided education on etiology of Sx with anatomy, physiology explanation with images along with the benefits of customized pelvic PT Tx based on pt's medical conditions and musculoskeletal deficits.       Clinical Presentation  Evolving    Clinical Decision Making  Moderate    Rehab Potential  Good    PT Frequency  1x / week    PT Duration  12 weeks    PT Treatment/Interventions  Moist Heat;Manual techniques;Patient/family education;Therapeutic activities;Therapeutic exercise;Stair training;Neuromuscular re-education;Balance training;Scar mobilization;Energy conservation;Electrical Stimulation;Cryotherapy;Biofeedback;Functional mobility training;Taping;Joint Manipulations    Consulted and Agree with Plan of Care  Patient       Patient will benefit from skilled therapeutic intervention in order to improve the following deficits and impairments:  Abnormal gait, Improper body mechanics, Increased muscle spasms, Postural dysfunction, Decreased coordination, Decreased endurance, Decreased activity tolerance, Decreased strength, Decreased safety awareness, Decreased balance, Difficulty walking, Hypomobility, Decreased mobility, Increased fascial restricitons, Cardiopulmonary status limiting activity, Pain  Visit Diagnosis: Other idiopathic scoliosis, thoracolumbar region  Other abnormalities of gait and mobility  Other lack of coordination  Diastasis recti  Chronic bilateral low back pain, unspecified whether sciatica present  Pain in left hip  Pain in right hip     Problem List Patient Active Problem List   Diagnosis  Date Noted  . Sepsis (HCC) 03/17/2018    Caleb Khan ,PT, DPT, E-RYT  07/13/2018, 6:32 PM  [facility] MAIN [facility] SERVICES [address] [place], [place], [address] Phone: [phone]   Fax:  [phone]  Name: Caleb Khan MRN: [id number] Date of Birth: 08/02/1927

## 2018-07-17 NOTE — Addendum Note (Signed)
Addended by: Mariane MastersYEUNG, SHIN-YIING on: 07/17/2018 03:30 PM   Modules accepted: Orders

## 2018-07-19 ENCOUNTER — Ambulatory Visit: Payer: Medicare PPO | Admitting: Physical Therapy

## 2018-07-19 VITALS — BP 149/69

## 2018-07-19 DIAGNOSIS — R278 Other lack of coordination: Secondary | ICD-10-CM

## 2018-07-19 DIAGNOSIS — M25551 Pain in right hip: Secondary | ICD-10-CM

## 2018-07-19 DIAGNOSIS — M4125 Other idiopathic scoliosis, thoracolumbar region: Secondary | ICD-10-CM

## 2018-07-19 DIAGNOSIS — R2689 Other abnormalities of gait and mobility: Secondary | ICD-10-CM

## 2018-07-19 DIAGNOSIS — M25552 Pain in left hip: Secondary | ICD-10-CM

## 2018-07-19 DIAGNOSIS — M545 Low back pain, unspecified: Secondary | ICD-10-CM

## 2018-07-19 DIAGNOSIS — M6208 Separation of muscle (nontraumatic), other site: Secondary | ICD-10-CM

## 2018-07-19 DIAGNOSIS — G8929 Other chronic pain: Secondary | ICD-10-CM

## 2018-07-19 NOTE — Patient Instructions (Addendum)
Alternate between sitting on recliner for 20 min,  then in a chair with proper siting posture, hands behind hips on chair to press and lower shoulders   Commerical breaks:  and standing or seated marches ( 3 min) each with slowed lengthened breath   long breaths for a cool down stretches  BE SURE TO HAVE A COOL DOWN TO SLOW YOUR HEART RATE        1)prop foot on stool with one arm on the wall / back of chair  ( hip flexor stretch) rocking  5 reps  -->    2) prop foot on stool with one arm on the wall / back of chair,  turning navel to 45 deg for ( inner thigh stretch)   ( knee stays in line with foot)  5 breaths     3)  figure 4 seated    4)  90 deg turn in chair, bent knee , foot behind ( lunge) for top thigh     _______________  PELVIC FLOOR / KEGEL EXERCISES   Common Errors:  Breath holding: If you are holding your breath, you may be bearing down against your bladder instead of pulling it up. If you belly bulges up while you are squeezing, you are holding your breath. Be sure to breathe gently in and out while exercising. Counting out loud may help you avoid holding your breath.  Accessory muscle use: You should not see or feel other muscle movement when performing pelvic floor exercises. When done properly, no one can tell that you are performing the exercises. Keep the buttocks, belly and inner thighs relaxed.  Overdoing it: Your muscles can fatigue and stop working for you if you over-exercise. You may actually leak more or feel soreness at the lower abdomen or rectum.  YOUR HOME EXERCISE PROGRAM  L SHORT HOLDS: Position: on back or seated   Inhale and then exhale. Then squeeze the muscle by "lifting scrotum" ( less effort than you think DO not use the stomach muscles.    Perform 5 repetitions, 3 Times/day  Morning, lunch, dinner                    DECREASE DOWNWARD PRESSURE ON  YOUR PELVIC FLOOR, ABDOMINAL, LOW BACK MUSCLES       PRESERVE YOUR PELVIC HEALTH LONG-TERM    ** SQUEEZE pelvic floor BEFORE YOUR SNEEZE, COUGH, LAUGH   ** EXHALE BEFORE YOU RISE AGAINST GRAVITY (lifting, sit to stand, from squat to stand)   ** LOG ROLL OUT OF BED INSTEAD OF CRUNCH/SIT-UP    ____ Toileting:  Inhale and expand diaphragm to allow pelvic floor muscles expand sphincters , avoid breathholding

## 2018-07-19 NOTE — Therapy (Signed)
[facility] MAIN [facility] SERVICES [address] [place], [place], [address] Phone: [phone]   Fax:  [phone]  Physical Therapy Treatment  Patient Details  Name: Caleb Khan MRN: [id number] Date of Birth: 14-Aug-1927 No data recorded  Encounter Date: 07/19/2018  PT End of Session - 07/19/18 1115    Visit Number  2    Number of Visits  12    Date for PT Re-Evaluation  10/12/18    PT Start Time  1008    PT Stop Time  1115    PT Time Calculation (min)  67 min       Past Medical History:  Diagnosis Date  . Anemia   . Anginal pain (HCC)   . Arthritis    osteoarthritis  . Chronic kidney disease    stage 3  . COPD (chronic obstructive pulmonary disease) (HCC)    NO INHALERS  . Coronary artery disease   . Dyspnea    with exertion  . Hypertension   . Polymyalgia rheumatica (HCC)    TAKES PREDNISONE  . Spinal stenosis     Past Surgical History:  Procedure Laterality Date  . CARDIAC CATHETERIZATION Left 05/22/2016   Procedure: Left Heart Cath and Coronary Angiography;  Surgeon: Alwyn Pea, MD;  Location: [facility] INVASIVE CV LAB;  Service: Cardiovascular;  Laterality: Left;  . EYE SURGERY Bilateral    Cataract Extraction with IOL  . GREEN LIGHT LASER TURP (TRANSURETHRAL RESECTION OF PROSTATE N/A 04/26/2018   Procedure: GREEN LIGHT LASER TURP (TRANSURETHRAL RESECTION OF PROSTATE;  Surgeon: Orson Ape, MD;  Location: [facility] ORS;  Service: Urology;  Laterality: N/A;  . JOINT REPLACEMENT Right 2004   Shoulder Replacement  . JOINT REPLACEMENT Left 2007   Ankle Replacement  . PATELLAR TENDON REPAIR Left 07/15/2017   Procedure: PATELLA TENDON REPAIR;  Surgeon: Kennedy Bucker, MD;  Location: [facility] ORS;  Service: Orthopedics;  Laterality: Left;  Marland Kitchen QUADRICEPS TENDON REPAIR Left 07/15/2017   Procedure: REPAIR QUADRICEP TENDON;  Surgeon: Kennedy Bucker, MD;  Location: [facility] ORS;  Service: Orthopedics;  Laterality: Left;  . TONSILLECTOMY     . VASECTOMY      Vitals:   07/19/18 0940 07/19/18 0950 07/19/18 1014  BP: (!) 160/80 (!) 160/80 (!) 149/69  SpO2:   100%    Subjective Assessment - 07/19/18 0916    Subjective  Pt tried to practice the sit to stand with exhale. Pt felt the shoe lift feels natural and he hardly notices it. Pt sits in a recliner to watch ballgames and wants to know if that is good for him    Pertinent History  Pt has been screened for OSA but has not undergone sleep study. Pt's pulmonologist has nighttime oxygen ( 2L per min)  10 months ago and was recommended to lose 10 lbs. Pt has lost 20 lbs. Pt 's wife tells pt that pt no longer snores and grunts at night after being put on oxgen. Pt is still getting 3-4 x / night to go to the bathroom.      Patient Stated Goals   wants to not wear Depends during the day           [facility] PT Assessment - 07/19/18 1013      Observation/Other Assessments   Observations  poor propioception in  SLS with single UE on wall       Flexibility   Piriformis  limited in seated position B  Palpation   Palpation comment  pelvic girlde alignment levelled in standing with shoes / shoe lift on       Bed Mobility   Bed Mobility  --   breathholding with sit > supine                Pelvic Floor Special Questions - 07/19/18 1009    Diastasis Recti  no separation ( after wearing shoe lift for one week)     External Perineal Exam  overuse of abs with cue for kegel.         [facility] Adult PT Treatment/Exercise - 07/19/18 1013      Neuro Re-ed    Neuro Re-ed Details   see pt instructions      Exercises   Exercises  --   see pt instructions                 PT Long Term Goals - 07/12/18 0850      PT LONG TERM GOAL #1   Title  Pt will decrease wearing Depends from 1 per day to 0 per day in order to improve QOL     Time  12    Period  Weeks    Status  New    Target Date  10/04/18      PT LONG TERM GOAL #2   Title  Pt will be able to initiate  urination when standing from 10 sec to < 5 sec in order to improve pelvic floor function    Time  5    Period  Weeks    Status  New    Target Date  08/16/18      PT LONG TERM GOAL #3   Title  Pt will demo proper pelvic floor coordination with diaphragmatic breathing in order to promote pelvic function and postural stability for balance    Time  8    Period  Weeks    Status  New    Target Date  09/06/18      PT LONG TERM GOAL #4   Title  Pt demo equal alignment of pelvic girdle, demo less gait deviations ( Less R trunk lean, more equal stance phase) in order to progress to longer walking endurance    Time  2    Period  Weeks    Status  New    Target Date  07/26/18      PT LONG TERM GOAL #5   Title  Pt will decrease his PSQI score from 16% to < 10% in order to improve sleep and indication of  less trips to use the bathroom    Time  12    Period  Weeks    Status  New    Target Date  10/09/18      Additional Long Term Goals   Additional Long Term Goals  Yes      PT LONG TERM GOAL #6   Title  Pt will decrease PFDI -20 from 35% to < 17% in order to indicate improved pelvic floor control     Time  12    Period  Weeks    Status  New    Target Date  10/09/18      PT LONG TERM GOAL #7   Title  Pt will decrease ODI from 32% to < 16% to improve postural stability and walking /standing     Time  12    Period  Weeks    Status  New    Target Date  10/09/18            Plan - 07/19/18 1120    Clinical Impression Statement  Pt's diastasis recti resolved with correction of leg length difference 2/2 scoliosis with shoe lift. Addressed proper kegel technique as pt was practicing on his own incorrectly with overuse of abdominal mm. Added hip mobility stretches to improve huip mobility. Reviewed bed mobility techniques to minimize straining abdominal mm.  Plan to add scoliosis HEP and provide manual Tx to address scoliosis mm imbalance at next session. Continue to monitor pt's BP with  exertional exercises 2/2 to pt's co-morbdities.. Pt's BP returned to baseline after Tx. Educated and incorporated cool -down ( ~ 15-20 min) with stretches  after standing and seated marches ( 5 min total).    Pt continues to benefit from skilled PT    Rehab Potential  Good    PT Frequency  1x / week    PT Duration  12 weeks    PT Treatment/Interventions  Moist Heat;Manual techniques;Patient/family education;Therapeutic activities;Therapeutic exercise;Stair training;Neuromuscular re-education;Balance training;Scar mobilization;Energy conservation;Electrical Stimulation;Cryotherapy;Biofeedback;Functional mobility training;Taping;Joint Manipulations    Consulted and Agree with Plan of Care  Patient       Patient will benefit from skilled therapeutic intervention in order to improve the following deficits and impairments:  Abnormal gait, Improper body mechanics, Increased muscle spasms, Postural dysfunction, Decreased coordination, Decreased endurance, Decreased activity tolerance, Decreased strength, Decreased safety awareness, Decreased balance, Difficulty walking, Hypomobility, Decreased mobility, Increased fascial restricitons, Cardiopulmonary status limiting activity, Pain  Visit Diagnosis: Other abnormalities of gait and mobility  Other idiopathic scoliosis, thoracolumbar region  Other lack of coordination  Diastasis recti  Chronic bilateral low back pain, unspecified whether sciatica present  Pain in left hip  Pain in right hip     Problem List Patient Active Problem List   Diagnosis Date Noted  . Sepsis (HCC) 03/17/2018    Mariane MastersYeung,Shin Yiing ,PT, DPT, E-RYT  07/19/2018, 11:23 AM  [facility] [address] [place], [place], [address] Phone: [phone]   Fax:  [phone]  Name: Malachy MoodLeon Mcglothlin MRN: [id number] Date of Birth: 05-19-1928

## 2018-07-26 ENCOUNTER — Ambulatory Visit: Payer: Medicare PPO | Admitting: Physical Therapy

## 2018-07-26 DIAGNOSIS — M25551 Pain in right hip: Secondary | ICD-10-CM

## 2018-07-26 DIAGNOSIS — M4125 Other idiopathic scoliosis, thoracolumbar region: Secondary | ICD-10-CM | POA: Diagnosis not present

## 2018-07-26 DIAGNOSIS — M545 Low back pain, unspecified: Secondary | ICD-10-CM

## 2018-07-26 DIAGNOSIS — M6208 Separation of muscle (nontraumatic), other site: Secondary | ICD-10-CM

## 2018-07-26 DIAGNOSIS — G8929 Other chronic pain: Secondary | ICD-10-CM

## 2018-07-26 DIAGNOSIS — M25552 Pain in left hip: Secondary | ICD-10-CM

## 2018-07-26 DIAGNOSIS — R278 Other lack of coordination: Secondary | ICD-10-CM

## 2018-07-26 DIAGNOSIS — R2689 Other abnormalities of gait and mobility: Secondary | ICD-10-CM

## 2018-07-26 NOTE — Patient Instructions (Signed)
Scoliosis specific exercises:  Wall lean for scoliosis  Forearm slide against wall as you stand perpendicular to wall band under feet, feet hip width apart,   Opposite elbow by side, , imagine holding pencil under armpit as you lean toward wall, lowering forearm against the wall and opposite hand / forearm (R)  pulls band without letting elbow move away from side body  And slide back up , straightening body    Make sure the upper trapezius muscle does not hike up to ear as band is being pulled as you lean towards wall    band  10 x 2     Leaning L only to open the R  flank area   ______  R one sided push up by the wall  Palm at shoulder height  10 x 2   _____ To improve upright posture Wall squats: Feet two feet away from the wall Keep back of hips/ shoulders and elbows against by the wall Inhale, lower hips tot he point where knees above ankles, and not any further down where you can not see your toes  Exhale, rise up keeping back of hips and shoulders against wall   10 reps  X 2

## 2018-07-26 NOTE — Therapy (Signed)
[facility] MAIN [facility] SERVICES [address] [place], [place], [address] Phone: [phone]   Fax:  [phone]  Physical Therapy Treatment  Patient Details  Name: Quinlan Vollmer MRN: [id number] Date of Birth: 11-Dec-1927 No data recorded  Encounter Date: 07/26/2018  PT End of Session - 07/26/18 1007    Visit Number  3    Number of Visits  12    Date for PT Re-Evaluation  10/12/18    PT Start Time  0910    PT Stop Time  1007    PT Time Calculation (min)  57 min       Past Medical History:  Diagnosis Date  . Anemia   . Anginal pain (HCC)   . Arthritis    osteoarthritis  . Chronic kidney disease    stage 3  . COPD (chronic obstructive pulmonary disease) (HCC)    NO INHALERS  . Coronary artery disease   . Dyspnea    with exertion  . Hypertension   . Polymyalgia rheumatica (HCC)    TAKES PREDNISONE  . Spinal stenosis     Past Surgical History:  Procedure Laterality Date  . CARDIAC CATHETERIZATION Left 05/22/2016   Procedure: Left Heart Cath and Coronary Angiography;  Surgeon: Alwyn Pea, MD;  Location: [facility] INVASIVE CV LAB;  Service: Cardiovascular;  Laterality: Left;  . EYE SURGERY Bilateral    Cataract Extraction with IOL  . GREEN LIGHT LASER TURP (TRANSURETHRAL RESECTION OF PROSTATE N/A 04/26/2018   Procedure: GREEN LIGHT LASER TURP (TRANSURETHRAL RESECTION OF PROSTATE;  Surgeon: Orson Ape, MD;  Location: [facility] ORS;  Service: Urology;  Laterality: N/A;  . JOINT REPLACEMENT Right 2004   Shoulder Replacement  . JOINT REPLACEMENT Left 2007   Ankle Replacement  . PATELLAR TENDON REPAIR Left 07/15/2017   Procedure: PATELLA TENDON REPAIR;  Surgeon: Kennedy Bucker, MD;  Location: [facility] ORS;  Service: Orthopedics;  Laterality: Left;  Marland Kitchen QUADRICEPS TENDON REPAIR Left 07/15/2017   Procedure: REPAIR QUADRICEP TENDON;  Surgeon: Kennedy Bucker, MD;  Location: [facility] ORS;  Service: Orthopedics;  Laterality: Left;  . TONSILLECTOMY     . VASECTOMY      There were no vitals filed for this visit.  Subjective Assessment - 07/26/18 0915    Subjective  Pt has noticed "he hardly leaks at all" .  Pt had stopped wearing an external external about a month ago.      Pertinent History  Pt has been screened for OSA but has not undergone sleep study. Pt's pulmonologist has nighttime oxygen ( 2L per min)  10 months ago and was recommended to lose 10 lbs. Pt has lost 20 lbs. Pt 's wife tells pt that pt no longer snores and grunts at night after being put on oxgen. Pt is still getting 3-4 x / night to go to the bathroom.      Patient Stated Goals   wants to not wear Depends during the day           [facility] PT Assessment - 07/26/18 0949      Palpation   Spinal mobility  increased R paraspinal mm tensions , posterior rotation of thorax 2/2 scoliosis       6 Minute Walk- Baseline   BP (mmHg)  --   At 4'10" 182 /90 mm Hg, w/ 5' rest 153/80      6 minute walk test results    Aerobic Endurance Distance Walked  760   4:10 , need  to sit down for rest, did not complete 6 min   Endurance additional comments  SOB, BORG 7/10                    [facility] Adult PT Treatment/Exercise - 07/26/18 0956      Therapeutic Activites    Therapeutic Activities  --   6 MWT     Neuro Re-ed    Neuro Re-ed Details   see pt instructions      Manual Therapy   Manual therapy comments  STM/ MWM at R paraspinal mm                   PT Long Term Goals - 07/12/18 0850      PT LONG TERM GOAL #1   Title  Pt will decrease wearing Depends from 1 per day to 0 per day in order to improve QOL     Time  12    Period  Weeks    Status  New    Target Date  10/04/18      PT LONG TERM GOAL #2   Title  Pt will be able to initiate urination when standing from 10 sec to < 5 sec in order to improve pelvic floor function    Time  5    Period  Weeks    Status  New    Target Date  08/16/18      PT LONG TERM GOAL #3   Title  Pt will demo  proper pelvic floor coordination with diaphragmatic breathing in order to promote pelvic function and postural stability for balance    Time  8    Period  Weeks    Status  New    Target Date  09/06/18      PT LONG TERM GOAL #4   Title  Pt demo equal alignment of pelvic girdle, demo less gait deviations ( Less R trunk lean, more equal stance phase) in order to progress to longer walking endurance    Time  2    Period  Weeks    Status  New    Target Date  07/26/18      PT LONG TERM GOAL #5   Title  Pt will decrease his PSQI score from 16% to < 10% in order to improve sleep and indication of  less trips to use the bathroom    Time  12    Period  Weeks    Status  New    Target Date  10/09/18      Additional Long Term Goals   Additional Long Term Goals  Yes      PT LONG TERM GOAL #6   Title  Pt will decrease PFDI -20 from 35% to < 17% in order to indicate improved pelvic floor control     Time  12    Period  Weeks    Status  New    Target Date  10/09/18      PT LONG TERM GOAL #7   Title  Pt will decrease ODI from 32% to < 16% to improve postural stability and walking /standing     Time  12    Period  Weeks    Status  New    Target Date  10/09/18            Plan - 07/26/18 1007    Clinical Impression Statement  Pt is making improvements with less leakage. Initiated scoliosis-specific Tx  today with manual Tx and, stretches and strengthening according to scoliosis curves. Also asssessed 6 MWT which pt did not complete due to SOB and fatigue at 4'10".  BP decreased from 182/90 mm Hg to 153/80 mm Hg after 5 min rest. Pt reports his BP peaks with exertional activities. Anticipate pt's cardiopulmonary system may improve with scoliosis specific interventions. Cued for breathing to slow down HR after red band exercise. Pt demo'd correctly with less SOB.   Educated on upright posture and less forward head with sitting w/ co-activation of lat mm by placing hands on chair.  Added wall  squat with scapular/cervical retraction to improve posture. Modified to less knee flexion in wall squat to minimzie knee complaints, Pt continues to benefit from skilled PT.     Rehab Potential  Good    PT Frequency  1x / week    PT Duration  12 weeks    PT Treatment/Interventions  Moist Heat;Manual techniques;Patient/family education;Therapeutic activities;Therapeutic exercise;Stair training;Neuromuscular re-education;Balance training;Scar mobilization;Energy conservation;Electrical Stimulation;Cryotherapy;Biofeedback;Functional mobility training;Taping;Joint Manipulations    Consulted and Agree with Plan of Care  Patient       Patient will benefit from skilled therapeutic intervention in order to improve the following deficits and impairments:  Abnormal gait, Improper body mechanics, Increased muscle spasms, Postural dysfunction, Decreased coordination, Decreased endurance, Decreased activity tolerance, Decreased strength, Decreased safety awareness, Decreased balance, Difficulty walking, Hypomobility, Decreased mobility, Increased fascial restricitons, Cardiopulmonary status limiting activity, Pain  Visit Diagnosis: Other idiopathic scoliosis, thoracolumbar region  Other abnormalities of gait and mobility  Other lack of coordination  Diastasis recti  Chronic bilateral low back pain, unspecified whether sciatica present  Pain in left hip  Pain in right hip     Problem List Patient Active Problem List   Diagnosis Date Noted  . Sepsis (HCC) 03/17/2018    Mariane MastersYeung,Shin Yiing ,PT, DPT, E-RYT  07/26/2018, 10:11 AM  [facility] [address] [place], [place], [address] Phone: [phone]   Fax:  [phone]  Name: Malachy MoodLeon Toenjes MRN: [id number] Date of Birth: 04-16-1928

## 2018-08-02 ENCOUNTER — Ambulatory Visit: Payer: Medicare PPO | Attending: Urology | Admitting: Physical Therapy

## 2018-08-02 DIAGNOSIS — M4125 Other idiopathic scoliosis, thoracolumbar region: Secondary | ICD-10-CM

## 2018-08-02 DIAGNOSIS — G8929 Other chronic pain: Secondary | ICD-10-CM | POA: Diagnosis present

## 2018-08-02 DIAGNOSIS — R2689 Other abnormalities of gait and mobility: Secondary | ICD-10-CM | POA: Diagnosis present

## 2018-08-02 DIAGNOSIS — M6208 Separation of muscle (nontraumatic), other site: Secondary | ICD-10-CM | POA: Insufficient documentation

## 2018-08-02 DIAGNOSIS — M545 Low back pain: Secondary | ICD-10-CM | POA: Diagnosis present

## 2018-08-02 DIAGNOSIS — M25552 Pain in left hip: Secondary | ICD-10-CM | POA: Diagnosis present

## 2018-08-02 DIAGNOSIS — Z9181 History of falling: Secondary | ICD-10-CM | POA: Diagnosis present

## 2018-08-02 DIAGNOSIS — M25551 Pain in right hip: Secondary | ICD-10-CM

## 2018-08-02 DIAGNOSIS — R278 Other lack of coordination: Secondary | ICD-10-CM | POA: Diagnosis present

## 2018-08-02 DIAGNOSIS — M6281 Muscle weakness (generalized): Secondary | ICD-10-CM | POA: Insufficient documentation

## 2018-08-02 NOTE — Therapy (Signed)
[facility] [address] [place], [place], [address] Phone: [phone]   Fax:  [phone]  Physical Therapy Treatment  Patient Details  Name: Caleb Khan MRN: [id number] Date of Birth: [birth year] No data recorded  Encounter Date: 08/02/2018  PT End of Session - 08/02/18 1356    Visit Number  4    Number of Visits  12    Date for PT Re-Evaluation  10/12/18    PT Start Time  0900    PT Stop Time  1000    PT Time Calculation (min)  60 min       Past Medical History:  Diagnosis Date  . Anemia   . Anginal pain (HCC)   . Arthritis    osteoarthritis  . Chronic kidney disease    stage 3  . COPD (chronic obstructive pulmonary disease) (HCC)    NO INHALERS  . Coronary artery disease   . Dyspnea    with exertion  . Hypertension   . Polymyalgia rheumatica (HCC)    TAKES PREDNISONE  . Spinal stenosis     Past Surgical History:  Procedure Laterality Date  . CARDIAC CATHETERIZATION Left 05/22/2016   Procedure: Left Heart Cath and Coronary Angiography;  Surgeon: Alwyn Peawayne D Callwood, MD;  Location: [facility] INVASIVE CV LAB;  Service: Cardiovascular;  Laterality: Left;  . EYE SURGERY Bilateral    Cataract Extraction with IOL  . GREEN LIGHT LASER TURP (TRANSURETHRAL RESECTION OF PROSTATE N/A 04/26/2018   Procedure: GREEN LIGHT LASER TURP (TRANSURETHRAL RESECTION OF PROSTATE;  Surgeon: Orson ApeWolff, Michael R, MD;  Location: [facility] ORS;  Service: Urology;  Laterality: N/A;  . JOINT REPLACEMENT Right 2004   Shoulder Replacement  . JOINT REPLACEMENT Left 2007   Ankle Replacement  . PATELLAR TENDON REPAIR Left 07/15/2017   Procedure: PATELLA TENDON REPAIR;  Surgeon: Kennedy BuckerMenz, Michael, MD;  Location: [facility] ORS;  Service: Orthopedics;  Laterality: Left;  Marland Kitchen. QUADRICEPS TENDON REPAIR Left 07/15/2017   Procedure: REPAIR QUADRICEP TENDON;  Surgeon: Kennedy BuckerMenz, Michael, MD;  Location: [facility] ORS;  Service: Orthopedics;  Laterality: Left;  . TONSILLECTOMY     . VASECTOMY      There were no vitals filed for this visit.  Subjective Assessment - 08/02/18 1347    Subjective  Pt reports R knee hurts when putting weight on it, sit to stand and lowering to sit. Pt had questions about the scoliosis exercises. Pt notices dribbling once standing but leakage over all is improving.     Pertinent History  Pt has been screened for OSA but has not undergone sleep study. Pt's pulmonologist has nighttime oxygen ( 2L per min)  10 months ago and was recommended to lose 10 lbs. Pt has lost 20 lbs. Pt 's wife tells pt that pt no longer snores and grunts at night after being put on oxgen. Pt is still getting 3-4 x / night to go to the bathroom.      Patient Stated Goals   wants to not wear Depends during the day           [facility] Assessment - 08/02/18 1350      Observation/Other Assessments   Observations  with SPC in L UE, less R trunk lean       Squat   Comments  proper technique       Ambulation/Gait   Gait Comments  less trunk lean with bilateral hiking poles use  [facility] Adult PT Treatment/Exercise - 08/02/18 1350      Therapeutic Activites    Therapeutic Activities  --   cane and hiking poles use education     Neuro Re-ed    Neuro Re-ed Details   modified HEP due to wrist pain. Withheld wall one sided  push up. Added resistance band for R flank lengthening. Required verbal/ tactile cues for proper technique.        Exercises   Other Exercises   leg press 90 lb x 20,  115lb x 5 ( reported more weight on LLE due to R knee pain. Stopped exercise and plan to withhold from POC)                   PT Long Term Goals - 07/12/18 0850      PT LONG TERM GOAL #1   Title  Pt will decrease wearing Depends from 1 per day to 0 per day in order to improve QOL     Time  12    Period  Weeks    Status  New    Target Date  10/04/18      PT LONG TERM GOAL #2   Title  Pt will be able to initiate urination when standing  from 10 sec to < 5 sec in order to improve pelvic floor function    Time  5    Period  Weeks    Status  New    Target Date  08/16/18      PT LONG TERM GOAL #3   Title  Pt will demo proper pelvic floor coordination with diaphragmatic breathing in order to promote pelvic function and postural stability for balance    Time  8    Period  Weeks    Status  New    Target Date  09/06/18      PT LONG TERM GOAL #4   Title  Pt demo equal alignment of pelvic girdle, demo less gait deviations ( Less R trunk lean, more equal stance phase) in order to progress to longer walking endurance    Time  2    Period  Weeks    Status  New    Target Date  07/26/18      PT LONG TERM GOAL #5   Title  Pt will decrease his PSQI score from 16% to < 10% in order to improve sleep and indication of  less trips to use the bathroom    Time  12    Period  Weeks    Status  New    Target Date  10/09/18      Additional Long Term Goals   Additional Long Term Goals  Yes      PT LONG TERM GOAL #6   Title  Pt will decrease PFDI -20 from 35% to < 17% in order to indicate improved pelvic floor control     Time  12    Period  Weeks    Status  New    Target Date  10/09/18      PT LONG TERM GOAL #7   Title  Pt will decrease ODI from 32% to < 16% to improve postural stability and walking /standing     Time  12    Period  Weeks    Status  New    Target Date  10/09/18            Plan - 08/02/18 1357  Clinical Impression Statement  Modified scoliosis HEP due to arthritis and added new HEP with which pt did not have complaints. Added glut and thoracolumbar strengthening exercise to HEP which pt demo'd correctly but required cues for single UE for balance and excessive tactile and verbal cues for feet propioception for better balance.  Initiated leg press machine but withholding from HEP due to report of R knee pain. Assessed R knee which showed increased inflammation and suspect pt's RLE shorter leg length 2/2  severe scoliosis contributes to the increased loading on RLE and R trunk lean during gait. Pt demo'd less R trunk lean after cane was switched to L UE and shoe lift replaced his own Dr. Jabier Mutton lift which was soft. Initiated use of hiking poles with walking routine but educated pt to still use his RW when walking with dog.  At the next session, plan to address pt's report of dribbling when going from sitting to standing. Plan to also teach pt proper falling strategies as pt has Hx of falls. Addressing pt's scoliosis will help pt minimize falls as well.    Rehab Potential  Good    PT Frequency  1x / week    PT Duration  12 weeks    PT Treatment/Interventions  Moist Heat;Manual techniques;Patient/family education;Therapeutic activities;Therapeutic exercise;Stair training;Neuromuscular re-education;Balance training;Scar mobilization;Energy conservation;Electrical Stimulation;Cryotherapy;Biofeedback;Functional mobility training;Taping;Joint Manipulations    Consulted and Agree with Plan of Care  Patient       Patient will benefit from skilled therapeutic intervention in order to improve the following deficits and impairments:  Abnormal gait, Improper body mechanics, Increased muscle spasms, Postural dysfunction, Decreased coordination, Decreased endurance, Decreased activity tolerance, Decreased strength, Decreased safety awareness, Decreased balance, Difficulty walking, Hypomobility, Decreased mobility, Increased fascial restricitons, Cardiopulmonary status limiting activity, Pain  Visit Diagnosis: No diagnosis found.     Problem List Patient Active Problem List   Diagnosis Date Noted  . Sepsis (HCC) 03/17/2018    Mariane Masters ,PT, DPT, E-RYT  08/02/2018, 1:58 PM  [facility] MAIN [facility] SERVICES [address] [place], [place], [address] Phone: [phone]   Fax:  [phone]  Name: Caleb Khan MRN: [id number] Date of Birth: [birth year]

## 2018-08-02 NOTE — Patient Instructions (Signed)
Replaced one-arm push up on wall with    "Mountain hiker throwing"  - band red  Lunge position,  R foot back, L foot forward, knee above ankle,  toes point forward of both feet  trunk leans to create diagonal line with back leg)  Ski track stance, band under back foot,  50% weight on front leg (knee above ankle)  50% weight on back leg   Band are against buttocks and band Elbows point to the sky, hands holding band   Inhale, exhale extend elbows like you are throwing something overhead,  10 reps on each leg  X 2 sets    _________  L shoulder lean to wall to strengthen L flank   30 sec   _________  Arnoldo MoraleBuy hiking poles , adjust height at hips   _________ Wear shoe lift in R shoe not the Dr. Jari SportsmanScholls lift   __________  Use cane in L hand to offload R knee and minimize R trunk lean

## 2018-08-09 ENCOUNTER — Ambulatory Visit: Payer: Medicare PPO | Admitting: Physical Therapy

## 2018-08-09 VITALS — BP 165/71 | HR 55

## 2018-08-09 DIAGNOSIS — M25551 Pain in right hip: Secondary | ICD-10-CM

## 2018-08-09 DIAGNOSIS — M4125 Other idiopathic scoliosis, thoracolumbar region: Secondary | ICD-10-CM | POA: Diagnosis not present

## 2018-08-09 DIAGNOSIS — M25552 Pain in left hip: Secondary | ICD-10-CM

## 2018-08-09 DIAGNOSIS — M545 Low back pain, unspecified: Secondary | ICD-10-CM

## 2018-08-09 DIAGNOSIS — R2689 Other abnormalities of gait and mobility: Secondary | ICD-10-CM

## 2018-08-09 DIAGNOSIS — G8929 Other chronic pain: Secondary | ICD-10-CM

## 2018-08-09 DIAGNOSIS — R278 Other lack of coordination: Secondary | ICD-10-CM

## 2018-08-09 DIAGNOSIS — M6208 Separation of muscle (nontraumatic), other site: Secondary | ICD-10-CM

## 2018-08-10 NOTE — Therapy (Addendum)
[place] MAIN [facility] SERVICES [address] [place], [place], [address] Phone: [phone]   Fax:  [phone]  Physical Therapy Treatment / Discharge Summary  Patient Details  Name: Caleb Khan MRN: [id number] Date of Birth: April 26, 1928 No data recorded  Encounter Date: 08/09/2018  PT End of Session - 08/10/18 2242    Visit Number  5    Number of Visits  12    Date for PT Re-Evaluation  10/12/18    PT Start Time  0900    PT Stop Time  1003    PT Time Calculation (min)  63 min    Activity Tolerance  Patient tolerated treatment well;No increased pain    Behavior During Therapy  WFL for tasks assessed/performed       Past Medical History:  Diagnosis Date  . Anemia   . Anginal pain ([place])   . Arthritis    osteoarthritis  . Chronic kidney disease    stage 3  . COPD (chronic obstructive pulmonary disease) ([place])    NO INHALERS  . Coronary artery disease   . Dyspnea    with exertion  . Hypertension   . Polymyalgia rheumatica (HCC)    TAKES PREDNISONE  . Spinal stenosis     Past Surgical History:  Procedure Laterality Date  . CARDIAC CATHETERIZATION Left 05/22/2016   Procedure: Left Heart Cath and Coronary Angiography;  Surgeon: Yolonda Kida, MD;  Location: [place] CV LAB;  Service: Cardiovascular;  Laterality: Left;  . EYE SURGERY Bilateral    Cataract Extraction with IOL  . GREEN LIGHT LASER TURP (TRANSURETHRAL RESECTION OF PROSTATE N/A 04/26/2018   Procedure: GREEN LIGHT LASER TURP (TRANSURETHRAL RESECTION OF PROSTATE;  Surgeon: Royston Cowper, MD;  Location: [facility] ORS;  Service: Urology;  Laterality: N/A;  . JOINT REPLACEMENT Right 2004   Shoulder Replacement  . JOINT REPLACEMENT Left 2007   Ankle Replacement  . PATELLAR TENDON REPAIR Left 07/15/2017   Procedure: PATELLA TENDON REPAIR;  Surgeon: Hessie Knows, MD;  Location: [facility] ORS;  Service: Orthopedics;  Laterality: Left;  Marland Kitchen QUADRICEPS TENDON REPAIR Left  07/15/2017   Procedure: REPAIR QUADRICEP TENDON;  Surgeon: Hessie Knows, MD;  Location: [facility] ORS;  Service: Orthopedics;  Laterality: Left;  . TONSILLECTOMY    . VASECTOMY      Vitals:   08/09/18 0950 08/09/18 1012 08/09/18 1022  BP: (!) 182/68 (!) 182/65 (!) 165/71  Pulse:   (!) 55    Subjective Assessment - 08/10/18 2254    Subjective  Pt reported that using his hiking poles which has been helpful for walking. They helped him to "walk steadily, less rocking compared to using a cane". The daytime leakage continues to reduce. Yesterday he had no leakage. "it is almost immeasurable anymore."        Pertinent History  Pt has been screened for OSA but has not undergone sleep study. Pt's pulmonologist has nighttime oxygen ( 2L per min)  10 months ago and was recommended to lose 10 lbs. Pt has lost 20 lbs. Pt 's wife tells pt that pt no longer snores and grunts at night after being put on oxgen. Pt is still getting 3-4 x / night to go to the bathroom.      Patient Stated Goals   wants to not wear Depends during the day           [facility] PT Assessment - 08/10/18 2245      Observation/Other Assessments  Observations  self-corrected with upright posture without cues       Posture/Postural Control   Posture Comments  poor stability, self-correct with reaching using UE with alternating UE/ hip ext next to table.  Required further tactile/ visual cues for feet propioception , trunk lean, more anterior COM.        Ambulation/Gait   Gait Comments  required cues more upright posture with use of hiking poles       Balance   Balance comment  perturbation training with manually applied perturbation at waist to promote stepping reflex in four directions. required cues for long step, more weight bearing in hind foot                  Pelvic Floor Special Questions - 08/09/18 1611    External Perineal Exam  --        [facility] Adult PT Treatment/Exercise - 08/10/18 2245      Therapeutic  Activites    Therapeutic Activities Monitored BP following activities 10', gait training, provided education on association of OSA and nocturia ( which is his remaining issue) and research articles were provided. Pt stated he will  f/u with his pulmonologist on the results of his oxygen studies but has not undergo a sleep study yet. Pt was encouraged to f/u with his provider as nocturia may be related to his risk factors for OSA while pelvic floor rehab has helped with his daytime leakage.       Neuro Re-ed    Neuro Re-ed Details   perturbation training 15',  gait training with hiking poles 10', reviewed HEP refining technique, alignment with modifications for more postural stability due poor balance/ propioception  10'                  PT Long Term Goals - 08/09/18 0959      PT LONG TERM GOAL #1   Title  Pt will decrease wearing Depends from 1 per day to 0 per day in order to improve QOL     Time  12    Period  Weeks    Status  Achieved      PT LONG TERM GOAL #2   Title  Pt will be able to initiate urination when standing from 10 sec to < 5 sec in order to improve pelvic floor function  ( 08/09/18: reports he has less difficulty when stable with hands on the wall , indicating pt's need for further stability/ strength/ balance training)     Time  5    Period  Weeks    Status  Partially Met      PT LONG TERM GOAL #3   Title  Pt will demo proper pelvic floor coordination with diaphragmatic breathing in order to promote pelvic function and postural stability for balance    Time  8    Period  Weeks    Status  Achieved     PT LONG TERM GOAL #4   Title  Pt demo equal alignment of pelvic girdle, demo less gait deviations ( Less R trunk lean, more equal stance phase) in order to progress to longer walking endurance    Time  2    Period  Weeks    Status  Achieved      PT LONG TERM GOAL #5   Title  Pt will decrease his PSQI score from 16% to < 10% in order to improve sleep and  indication of  less trips to  use the bathroom  ( 08/09/18:   nocturia decreased from 5 to 3-4  )      Time  12    Period  Weeks    Status   Not reassessed at d/c     Additional Long Term Goals   Additional Long Term Goals  Yes      PT LONG TERM GOAL #6   Title  Pt will decrease PFDI -20 from 35% to < 17% in order to indicate improved pelvic floor control  ( 1/14: 18%)     Time  12    Period  Weeks    Status  Achieved      PT LONG TERM GOAL #7   Title  Pt will decrease ODI from 32% to < 16% to improve postural stability and walking /standing  ( 1/14: 30%)     Time  12    Period  Weeks    Status  Achieved            Plan - 08/10/18 2242    Clinical Impression Statement  Pt achieved 5/6 goals and partially met remaining goal. Pt's urinary Sx and LBP have improved based on ODI and PFDI scores.   Pt's daytime leakage has been resolving with significantly decreased urinary pad use. Pt demo'd improved intraabdominal pressures sytem with manual Tx and scoliosis -specific HEP and neuromuscular reeducation which have contributed to his continence.  Improvements include: leg length difference corrected with shoe lift in R shoe,  diastasis recti resolved, curves to thoracolumbar curves were addressed with appropriate lengthening and strengthening. Pt demo'd proper body mechanics to optimize pelvic floor and minimize strain.  Pt's remaining urinary issues include nocturia which has had no change since [facility]. Therefore, DPT suspect nocturia is less linked with musculoskeletal impairments and more linked with systemic factor, such as OSA.  Pt was encouraged to f/u with his provider given his risk factors for OSA ( seee complex medical Hx with cardiopulmonary impairments).    Scoliosis was further addressed to help improve pelvic Sx but to also improve gait and balance.  Hip and thoracolumbar strengthening were added into HEP over previous sessions. Modifications to strength training was needed 2/2 to R  OA and pain.  Pt demo'd IND with scoliosis-specific HEP after review and further cues. Pt required less cues for upright posture. Use of hiking poles facilitated less R trunk lean 2/2 scoliosis which will help pt maintain walking for exercise. Advised pt to continue using RW when walking his dog for safety and stability. Initiated perturbation training today to promote quick stepping reflex due to pt's Hx of falls. Pt demo'd quicker stepping strategies with excessive cues for more weight bearing in hind leg and weight shifting COM. While pt is ready for d/c from pelvic rehab, pt is being referred for falls / balance and strengthening training. New order has been sent to pt's PCP to sign.   Notes for next therapist re: his endurance: _BP was monitored activities which showed to be high (180s/70s mm Hg after walking and other balance activities. BP lowered with long rest break 150/70s mm Hg.   _6 MWT was measured at baseline 760 ft ( 4'10" pt was not able to complete full 6 minutes due to SOB and fatigue, Modified BORG 7/10). Did not reassess before d/c new 6 MWT values when using hiking poles and wearing R shoe lift which helped pt significantly with stability and to minimize R trunk lean 2/2 scoliosis.  Rehab Potential  Good    PT Frequency  1x / week    PT Duration  12 weeks    PT Treatment/Interventions  Moist Heat;Manual techniques;Patient/family education;Therapeutic activities;Therapeutic exercise;Stair training;Neuromuscular re-education;Balance training;Scar mobilization;Energy conservation;Electrical Stimulation;Cryotherapy;Biofeedback;Functional mobility training;Taping;Joint Manipulations    Consulted and Agree with Plan of Care  Patient       Patient will benefit from skilled therapeutic intervention in order to improve the following deficits and impairments:  Abnormal gait, Improper body mechanics, Increased muscle spasms, Postural dysfunction, Decreased coordination,  Decreased endurance, Decreased activity tolerance, Decreased strength, Decreased safety awareness, Decreased balance, Difficulty walking, Hypomobility, Decreased mobility, Increased fascial restricitons, Cardiopulmonary status limiting activity, Pain  Visit Diagnosis: Other idiopathic scoliosis, thoracolumbar region  Other abnormalities of gait and mobility  Diastasis recti  Chronic bilateral low back pain, unspecified whether sciatica present  Other lack of coordination  Pain in left hip  Pain in right hip     Problem List Patient Active Problem List   Diagnosis Date Noted  . Sepsis ([place]) 03/17/2018    Jerl Mina ,PT, DPT, E-RYT  08/10/2018, 10:55 PM  [place] MAIN [facility] SERVICES [address] [place], [place], [address] Phone: [phone]   Fax:  [phone]  Name: Caleb Khan MRN: [id number] Date of Birth: [birth year]

## 2018-08-16 ENCOUNTER — Encounter: Payer: Medicare PPO | Admitting: Physical Therapy

## 2018-08-24 ENCOUNTER — Ambulatory Visit: Payer: Medicare PPO

## 2018-08-24 DIAGNOSIS — M4125 Other idiopathic scoliosis, thoracolumbar region: Secondary | ICD-10-CM | POA: Diagnosis not present

## 2018-08-24 DIAGNOSIS — Z9181 History of falling: Secondary | ICD-10-CM

## 2018-08-24 DIAGNOSIS — M6281 Muscle weakness (generalized): Secondary | ICD-10-CM

## 2018-08-24 NOTE — Patient Instructions (Signed)
Access Code: [email or web address]  URL: [email or web address]  Date: 08/24/2018  Prepared by: Ria Comment   Exercises  Sit to Stand - 10 reps - 1 sets - 2x daily - 7x weekly  Sidelying Hip Abduction - 10 reps - 1 sets - 3 seconds hold - 2x daily - 7x weekly

## 2018-08-24 NOTE — Therapy (Signed)
[place] [facility] [address] [place], [place], [address] Phone: [phone]   Fax:  [phone]  Physical Therapy Evaluation  Patient Details  Name: Caleb MoodLeon Khan MRN: [id number] Date of Birth: Feb 26, 1928 Referring Provider (PT): Dr. Burnadette PopLinthavong   Encounter Date: 08/24/2018  PT End of Session - 08/25/18 1142    Visit Number  1    Number of Visits  13    Date for PT Re-Evaluation  11/16/18    Authorization Type  Eval: 08/24/18    PT Start Time  1005    PT Stop Time  1100    PT Time Calculation (min)  55 min    Equipment Utilized During Treatment  Gait belt    Activity Tolerance  Patient tolerated treatment well    Behavior During Therapy  [facility] for tasks assessed/performed       Past Medical History:  Diagnosis Date  . Anemia   . Anginal pain (HCC)   . Arthritis    osteoarthritis  . Chronic kidney disease    stage 3  . COPD (chronic obstructive pulmonary disease) (HCC)    NO INHALERS  . Coronary artery disease   . Dyspnea    with exertion  . Hypertension   . Polymyalgia rheumatica (HCC)    TAKES PREDNISONE  . Spinal stenosis     Past Surgical History:  Procedure Laterality Date  . CARDIAC CATHETERIZATION Left 05/22/2016   Procedure: Left Heart Cath and Coronary Angiography;  Surgeon: Alwyn Peawayne D Callwood, MD;  Location: [facility] INVASIVE CV LAB;  Service: Cardiovascular;  Laterality: Left;  . EYE SURGERY Bilateral    Cataract Extraction with IOL  . GREEN LIGHT LASER TURP (TRANSURETHRAL RESECTION OF PROSTATE N/A 04/26/2018   Procedure: GREEN LIGHT LASER TURP (TRANSURETHRAL RESECTION OF PROSTATE;  Surgeon: Orson ApeWolff, Michael R, MD;  Location: [facility] ORS;  Service: Urology;  Laterality: N/A;  . JOINT REPLACEMENT Right 2004   Shoulder Replacement  . JOINT REPLACEMENT Left 2007   Ankle Replacement  . PATELLAR TENDON REPAIR Left 07/15/2017   Procedure: PATELLA TENDON REPAIR;  Surgeon: Kennedy BuckerMenz, Michael, MD;  Location: [facility] ORS;   Service: Orthopedics;  Laterality: Left;  Marland Kitchen. QUADRICEPS TENDON REPAIR Left 07/15/2017   Procedure: REPAIR QUADRICEP TENDON;  Surgeon: Kennedy BuckerMenz, Michael, MD;  Location: [facility] ORS;  Service: Orthopedics;  Laterality: Left;  . TONSILLECTOMY    . VASECTOMY      There were no vitals filed for this visit.   Subjective Assessment - 08/24/18 1018    Subjective  BLE weakness and balance deficits    Pertinent History  Pt was previously seeing pelvic health PT for urinary incontinence s/p laser TURP procedure October of 2019. With most of his urinary incontinence now resolved he was referred onward due to ongoing LE weakness. Pt reports a history of spinal stenosis with bilateral hip and BLE pain since July of 2019. He also reports bilateral hip OA. He had an MRI which showed severe spinal stenosis. He was told that he is not a surgical candidate. Pt struggles with his balance and reports approximately 4 falls in the last 6 months. He typically catches his toes resulting in falling forward. He intermittently uses hiking poles for ambulation. He reports DOE with very minimal walking and underwent pulmonary rehab in the early part of 2019 without any improvement in his breathing. Pt would like to improve his leg strength, balance, and endurance with walking.    Limitations  Walking    Diagnostic  tests  Lumbar MRI 03/02/2018: Severe L3-4 stenosis, Mod/severe L4-5 stenosis, Severe L5-S1 stenosis    Patient Stated Goals  Improve leg strength and endurance as well as improve balance    Currently in Pain?  No/denies   Not at rest   Pain Score  0-No pain   Worst: 6/10; Best: 0/10   Pain Location  Hip    Pain Orientation  Right;Left;Posterior    Pain Descriptors / Indicators  Dull    Pain Type  Chronic pain    Pain Radiating Towards  Pain runs down the back of the legs along the hamstrings to the level of the knees    Pain Onset  More than a month ago    Pain Frequency  Intermittent   Daily, worse in the morning    Aggravating Factors   After inactivity, getting up in the middle of the night    Pain Relieving Factors  After walking pain subsides but gets extreme fatigue     Effect of Pain on Daily Activities  Pt likes to sleep on his back         [facility] PT Assessment - 08/24/18 1012      Assessment   Medical Diagnosis  Weakness    Referring Provider (PT)  Dr. Burnadette Pop    Onset Date/Surgical Date  01/24/18   Approximate   Hand Dominance  Right    Next MD Visit  None reported by patient    Prior Therapy  Recently saw pelvic health PT for urinary incontinence s/p TURP. Pulmonary rehab during first part of last year.       Precautions   Precautions  None      Restrictions   Weight Bearing Restrictions  No      Balance Screen   Has the patient fallen in the past 6 months  Yes    How many times?  3   Typically falls forward when foot catches   Has the patient had a decrease in activity level because of a fear of falling?   Yes    Is the patient reluctant to leave their home because of a fear of falling?   Yes      Home Environment   Living Environment  Other (Comment)   Independent living at [facility] - single point   Hiking     Prior Function   Level of Independence  Independent;Independent with household mobility with device   Uses single point cane   Leisure  Walk, arrange golf outing but doesn't play, plays pool and bridge      Cognition   Overall Cognitive Status  Within Functional Limits for tasks assessed      Observation/Other Assessments   Other Surveys   Other Surveys    Activities of Balance Confidence Scale (ABC Scale)   61.875%      Standardized Balance Assessment   Standardized Balance Assessment  Berg Balance Test;Five Times Sit to Stand;10 meter walk test    Five times sit to stand comments   20.6 seconds    10 Meter Walk  Self-selected: 9.5s = 1.05 m/s; Fastest: 7.5s = 1.33 m/s      Berg Balance Test   Sit to Stand  Able to stand without  using hands and stabilize independently    Standing Unsupported  Able to stand safely 2 minutes    Sitting with Back Unsupported but Feet Supported on Floor or Stool  Able to sit  safely and securely 2 minutes    Stand to Sit  Sits safely with minimal use of hands    Transfers  Able to transfer safely, minor use of hands    Standing Unsupported with Eyes Closed  Able to stand 10 seconds safely    Standing Ubsupported with Feet Together  Able to place feet together independently and stand 1 minute safely    From Standing, Reach Forward with Outstretched Arm  Can reach confidently >25 cm (10")    From Standing Position, Pick up Object from Floor  Able to pick up shoe safely and easily    From Standing Position, Turn to Look Behind Over each Shoulder  Looks behind from both sides and weight shifts well    Turn 360 Degrees  Able to turn 360 degrees safely in 4 seconds or less    Standing Unsupported, Alternately Place Feet on Step/Stool  Able to stand independently and safely and complete 8 steps in 20 seconds    Standing Unsupported, One Foot in Front  Able to plae foot ahead of the other independently and hold 30 seconds    Standing on One Leg  Tries to lift leg/unable to hold 3 seconds but remains standing independently    Total Score  52      High Level Balance   High Level Balance Comments  30s Sit to Stand Test: 7.5 sit to stands       TREATMENT    SUBJECTIVE Chief complaint: Pt was previously seeing pelvic health PT for urinary incontinence s/p laser TURP procedure October of 2019. With most of his urinary incontinence now resolved he was referred onward due to ongoing LE weakness. Pt reports a history of spinal stenosis with bilateral hip and BLE pain since July of 2019. He also reports bilateral hip OA. He had an MRI which showed severe spinal stenosis. He was told that he is not a surgical candidate. Pt struggles with his balance and reports approximately 4 falls in the last 6 months.  He typically catches his toes resulting in falling forward. He intermittently uses hiking poles for ambulation. He reports DOE with very minimal walking and underwent pulmonary rehab in the early part of 2019 without any improvement in his breathing. Pt would like to improve his leg strength, balance, and endurance with walking. Imaging: Lumbar MRI 03/02/2018: Severe L3-4 stenosis, Mod/severe L4-5 stenosis, Severe L5-S1 stenosis Recent changes in overall health/medication: No Directional pattern for falls: Forward Follow-up appointment with MD: None scheduled Red flags (bowel/bladder changes, saddle paresthesia, personal history of cancer, chills/fever, night sweats, unrelenting pain) Negative   OBJECTIVE  MUSCULOSKELETAL: Tremor: Absent Bulk: Normal Tone: Normal  Posture Forward head and rounded shoulders  Gait Pt with notable foot slap bilaterally during gait with decreased to to floor clearance. He reports that he has to consciously lift his feet and "march" in order to not catch his toes  Strength R/L 5/5 Hip flexion 5/5 Hip external rotation 5/5 Hip internal rotation 4/4 Hip extension  3+/3+ Hip abduction 4+/4+ Hip adduction 5/5 Knee extension 4/4 Knee flexion 5/5 Ankle Plantarflexion 3+/3+ Ankle Dorsiflexion   NEUROLOGICAL:  Mental Status Patient is oriented to person, place and time.  Recent memory is intact.  Remote memory is intact.  Attention span and concentration are intact.  Expressive speech is intact.  Patient's fund of knowledge is within normal limits for educational level.   Sensation Grossly intact to light touch bilateral LEs as determined by testing dermatomes L2-S2 respectively  Proprioception and hot/cold testing deferred on this date  Reflexes Testing deferred  Coordination/Cerebellar Testing deferred  FUNCTIONAL OUTCOME MEASURES   Results Comments  BERG 52/56 Fall risk, in need of intervention  30s Sit to Stand Test 7.5 sit to stands  Considerable fatigue and weakness noted, fall risk  5TSTS 20.6 seconds Above normative values, fall risk  10 Meter Gait Speed Self-selected: 9.5s = 1.05 m/s; Fastest: 7.5s = 1.33 m/s Within normative values for full community ambulation  ABC Scale 61.875%     POSTURAL CONTROL TESTS   Clinical Test of Sensory Interaction for Balance    (CTSIB):  CONDITION TIME STRATEGY SWAY  Eyes open, firm surface 30 seconds ankle 1+  Eyes closed, firm surface 30 seconds ankle 2+  Eyes open, foam surface 30 seconds ankle 2+  Eyes closed, foam surface 30 seconds,  first attempt pt falls after 4 seconds ankle 3+                            Objective measurements completed on examination: See above findings.              PT Education - 08/25/18 1142    Education Details  Plan of care and HEP    Person(s) Educated  Patient    Methods  Explanation;Verbal cues;Handout    Comprehension  Verbalized understanding       PT Short Term Goals - 08/25/18 1244      PT SHORT TERM GOAL #1   Title  Pt will be independent with HEP in order to improve strength and balance in order to decrease fall risk and improve function at home and work.     Time  6    Period  Weeks    Status  New    Target Date  10/05/18        PT Long Term Goals - 08/25/18 1245      PT LONG TERM GOAL #1   Title  Pt will improve BERG by at least 3 points in order to demonstrate clinically significant improvement in balance.     Baseline  08/24/18: 52/56    Time  12    Period  Weeks    Status  New    Target Date  11/16/18      PT LONG TERM GOAL #2   Title  Pt will improve ABC by at least 13% in order to demonstrate clinically significant improvement in balance confidence.    Baseline  08/24/18: 61.875%    Time  12    Period  Weeks    Status  New    Target Date  11/16/18      PT LONG TERM GOAL #3   Title  Pt will decrease 5TSTS by at least 3 seconds in order to demonstrate clinically significant  improvement in LE strength.    Baseline  08/24/18: 20.6 seconds    Time  12    Period  Weeks    Status  New    Target Date  11/16/18      PT LONG TERM GOAL #4   Title  Pt will increase 30 seconds sit to stand test to greater than 11 complete sit to stand to sit to demonstrate improvement in LE endurance and decrease risk for falls    Baseline  08/24/18: 7.5 times    Time  12    Period  Weeks    Status  New  Target Date  11/16/18             Plan - 08/25/18 1146    Clinical Impression Statement  Pt is a pleasant 85 year-old male referred for difficulty with LE weakness and difficulty with balance. He has a history of severe spinal stenosis and also complains of bilateral hip and LE pain. PT examination reveals LE strength and endurance deficits particularly with ankle dorsiflexion, hip extension, and hip abduction. Pt demonstrates foot slap during ambulation with decreased toe to floor clearance during swing. This is likely a significant contributing factor to his imbalance and falls. His gait speed is WNL and BERG is 52/56. Five Time Sit to Stand is 20.6 seconds and he is able to perform 7.5 sit to stands in 30 seconds. His balance confidence is low at 61.875%.  Pt presents with deficits in strength, gait and balance. He will benefit from skilled PT services to address deficits in strength and balance and decrease risk for future falls.    History and Personal Factors relevant to plan of care:  Moderate (evolving): 1-2ersonal factors/comorbidities, 3 or more body systems/activity limitations/participation restrictions  p     Clinical Presentation  Unstable    Clinical Decision Making  Moderate    Rehab Potential  Good    PT Frequency  1x / week    PT Duration  12 weeks    PT Treatment/Interventions  Moist Heat;Manual techniques;Patient/family education;Therapeutic activities;Therapeutic exercise;Stair training;Neuromuscular re-education;Balance training;Scar mobilization;Energy  conservation;Electrical Stimulation;Cryotherapy;Biofeedback;Functional mobility training;Taping;ADLs/Self Care Home Management;Aquatic Therapy;Canalith Repostioning;Iontophoresis 4mg /ml Dexamethasone;Traction;Ultrasound;DME Instruction;Gait training;Passive range of motion;Dry needling;Vestibular;Spinal Manipulations;Joint Manipulations    PT Next Visit Plan  Consider , review and progress HEP, consider AFO practice    PT Home Exercise Plan  Sit to stand without UE support, sidelying hip abduction, additional exercises provided by pelvic PT    Consulted and Agree with Plan of Care  Patient       Patient will benefit from skilled therapeutic intervention in order to improve the following deficits and impairments:  Abnormal gait, Improper body mechanics, Postural dysfunction, Decreased endurance, Decreased activity tolerance, Decreased strength, Decreased balance, Difficulty walking, Decreased mobility, Cardiopulmonary status limiting activity, Pain  Visit Diagnosis: Muscle weakness (generalized) - Plan: PT plan of care cert/re-cert  History of falling - Plan: PT plan of care cert/re-cert     Problem List Patient Active Problem List   Diagnosis Date Noted  . Sepsis (HCC) 03/17/2018   Lynnea Maizes PT, DPT, GCS  Sejal Cofield 08/25/2018, 12:55 PM  [place] [facility] MAIN [facility] SERVICES [address] [place], [place], [address] Phone: [phone]   Fax:  [phone]  Name: Noboru Masoud MRN: [id number] Date of Birth: 1928/01/16

## 2018-08-30 ENCOUNTER — Ambulatory Visit: Payer: Medicare PPO | Attending: Urology

## 2018-08-30 VITALS — BP 166/73 | HR 74

## 2018-08-30 DIAGNOSIS — M25552 Pain in left hip: Secondary | ICD-10-CM | POA: Insufficient documentation

## 2018-08-30 DIAGNOSIS — Z9181 History of falling: Secondary | ICD-10-CM | POA: Insufficient documentation

## 2018-08-30 DIAGNOSIS — M6281 Muscle weakness (generalized): Secondary | ICD-10-CM | POA: Diagnosis present

## 2018-08-30 DIAGNOSIS — M25551 Pain in right hip: Secondary | ICD-10-CM | POA: Insufficient documentation

## 2018-08-30 NOTE — Patient Instructions (Signed)
Access Code: [email or web address]  URL: [email or web address]  Date: 08/30/2018  Prepared by: Ria Comment   Exercises  Clamshell - 10 reps - 1 sets - 3-5 seconds hold - 1x daily - 3x weekly  Supine Active Straight Leg Raise - 10 reps - 1 sets - 3-5 seconds hold - 1x daily - 3x weekly

## 2018-08-30 NOTE — Therapy (Signed)
[place] [facility] MAIN [facility] [address] [place], [place], [address] Phone: [phone]   Fax:  [phone]  Physical Therapy Treatment  Patient Details  Name: Caleb Khan MRN: [id number] Date of Birth: 1927-11-07 Referring Provider (PT): Dr. Burnadette PopLinthavong   Encounter Date: 08/30/2018  PT End of Session - 08/30/18 1726    Visit Number  2    Number of Visits  13    Date for PT Re-Evaluation  11/16/18    Authorization Type  Eval: 08/24/18    PT Start Time  0845    PT Stop Time  0930    PT Time Calculation (min)  45 min    Equipment Utilized During Treatment  Gait belt    Activity Tolerance  Patient tolerated treatment well    Behavior During Therapy  [facility] tasks assessed/per[facility]       Past Medical History:  Diagnosis Date  . Anemia   . Anginal pain (HCC)   . Arthritis    osteoarthritis  . Chronic kidney disease    stage 3  . COPD (chronic obstructive pulmonary disease) (HCC)    NO INHALERS  . Coronary artery disease   . Dyspnea    with exertion  . Hypertension   . Polymyalgia rheumatica (HCC)    TAKES PREDNISONE  . Spinal stenosis     Past Surgical History:  Procedure Laterality Date  . CARDIAC CATHETERIZATION Left 05/22/2016   Procedure: Left Heart Cath and Coronary Angiography;  Surgeon: Alwyn Peawayne D Callwood, MD;  Location: [facility] INVASIVE CV LAB;  Service: Cardiovascular;  Laterality: Left;  . EYE SURGERY Bilateral    Cataract Extraction with IOL  . GREEN LIGHT LASER TURP (TRANSURETHRAL RESECTION OF PROSTATE N/A 04/26/2018   Procedure: GREEN LIGHT LASER TURP (TRANSURETHRAL RESECTION OF PROSTATE;  Surgeon: Orson ApeWolff, Michael R, MD;  Location: [facility] ORS;  Service: Urology;  Laterality: N/A;  . JOINT REPLACEMENT Right 2004   Shoulder Replacement  . JOINT REPLACEMENT Left 2007   Ankle Replacement  . PATELLAR TENDON REPAIR Left 07/15/2017   Procedure: PATELLA TENDON REPAIR;  Surgeon: Kennedy BuckerMenz, Michael, MD;  Location: [facility] ORS;   Service: Orthopedics;  Laterality: Left;  Marland Kitchen. QUADRICEPS TENDON REPAIR Left 07/15/2017   Procedure: REPAIR QUADRICEP TENDON;  Surgeon: Kennedy BuckerMenz, Michael, MD;  Location: [facility] ORS;  Service: Orthopedics;  Laterality: Left;  . TONSILLECTOMY    . VASECTOMY      Vitals:   08/30/18 0855  BP: (!) 166/73  Pulse: 74  SpO2: 99%    Subjective Assessment - 08/30/18 0854    Subjective  Pt reports progressive weakness since last week. He denies any new onset focal weakness or numbness. No new bowel/bladder incontinence or saddle paresthesia. 3/10 bilateral chronic hip pain and no specific questions.     Pertinent History  Pt was previously seeing pelvic health PT for urinary incontinence s/p laser TURP procedure October of 2019. With most of his urinary incontinence now resolved he was referred onward due to ongoing LE weakness. Pt reports a history of spinal stenosis with bilateral hip and BLE pain since July of 2019. He also reports bilateral hip OA. He had an MRI which showed severe spinal stenosis. He was told that he is not a surgical candidate. Pt struggles with his balance and reports approximately 4 falls in the last 6 months. He typically catches his toes resulting in falling forward. He intermittently uses hiking poles for ambulation. He reports DOE with very minimal walking and underwent pulmonary  rehab in the early part of 2019 without any improvement in his breathing. Pt would like to improve his leg strength, balance, and endurance with walking.    Limitations  Walking    Diagnostic tests  Lumbar MRI 03/02/2018: Severe L3-4 stenosis, Mod/severe L4-5 stenosis, Severe L5-S1 stenosis    Patient Stated Goals  Improve leg strength and endurance as well as improve balance    Currently in Pain?  Yes    Pain Score  3     Pain Location  Hip    Pain Orientation  Right;Left    Pain Descriptors / Indicators  Dull    Pain Type  Chronic pain    Pain Onset  More than a month ago    Pain Frequency  Intermittent          TREATMENT   Ther-ex 2 minute walk: 370', 7/10 DOE, 16/20 RPE Vitals Preceding: BP: 166/73, HR: 74, SaO2: 99% Vitals following: BP: 201/85 HR: 90, SaO2: 94% After 5 minutes: Vitals: 167/86, HR: 79 SaO2: 99% Attempted sit to stand but during second rep pt reports "my right knee is killing me" so discontinued; Hooklying bridges 5s hold at top x 10; Hooklying clams with manual resistance 5s hold x 10; Sidelying SLR hip abduction attempted but excessive weakness noted; Sidelying clams x 10 bilateral (added to HEP); Hooklying SLR x 10 bilateral (added to HEP);   Manual Therapy  Single knee to chest stretch x 30s bilateral; Hip ER/IR stretch x 30s bilateral each; Long axis hip/lumbar distraction with belt assist from therapist with gentle oscillation 30s x 2 bilateral;   Pt educated throughout session about proper posture and technique with exercises. Improved exercise technique, movement at target joints, use of target muscles after min to mod verbal, visual, tactile cues.    Pt demonstrates excellent motivation during session however he is limited with his sit to stand exercises due to R knee pain. He denies any pain during bridging so this was performed as an alternative. Pt advised to discontinue sit to stand during his HEP. Added sidelying clams and SLR every other day to HEP due to reports of progressive weakness in BLEs as week continued after initial evaluation. Two minute walk test for patient of [address]' is below normative value of 64492' for retirement dwelling older adults. Pt reports significant exertion on RPE (16/20) and shortness of breath on BORG (7/10) during 2 MWT however his HR response is somewhat blunted and only gets up to around 90 bpm. This could warrant further investigation pending cardiologist input. Pt will benefit from PT services to address deficits in strength, balance, and mobility in order to return to full function at home.                         PT Short Term Goals - 08/25/18 1244      PT SHORT TERM GOAL #1   Title  Pt will be independent with HEP in order to improve strength and balance in order to decrease fall risk and improve function at home and work.     Time  6    Period  Weeks    Status  New    Target Date  10/05/18        PT Long Term Goals - 08/25/18 1245      PT LONG TERM GOAL #1   Title  Pt will improve BERG by at least 3 points in order to demonstrate clinically significant improvement  in balance.     Baseline  08/24/18: 52/56    Time  12    Period  Weeks    Status  New    Target Date  11/16/18      PT LONG TERM GOAL #2   Title  Pt will improve ABC by at least 13% in order to demonstrate clinically significant improvement in balance confidence.    Baseline  08/24/18: 61.875%    Time  12    Period  Weeks    Status  New    Target Date  11/16/18      PT LONG TERM GOAL #3   Title  Pt will decrease 5TSTS by at least 3 seconds in order to demonstrate clinically significant improvement in LE strength.    Baseline  08/24/18: 20.6 seconds    Time  12    Period  Weeks    Status  New    Target Date  11/16/18      PT LONG TERM GOAL #4   Title  Pt will increase 30 seconds sit to stand test to greater than 11 complete sit to stand to sit to demonstrate improvement in LE endurance and decrease risk for falls    Baseline  08/24/18: 7.5 times    Time  12    Period  Weeks    Status  New    Target Date  11/16/18            Plan - 08/30/18 0915    Clinical Impression Statement  Pt demonstrates excellent motivation during session however he is limited with his sit to stand exercises due to R knee pain. He denies any pain during bridging so this was performed as an alternative. Pt advised to discontinue sit to stand during his HEP. Added sidelying clams and SLR every other day to HEP due to reports of progressive weakness in BLEs as week continued after initial  evaluation. Two minute walk test for patient of 74' is below normative value of 36' for retirement dwelling older adults. Pt reports significant exertion on RPE (16/20) and shortness of breath on BORG (7/10) during 2 MWT however his HR response is somewhat blunted and only gets up to around 90 bpm. This could warrant further investigation pending cardiologist input. Pt will benefit from PT services to address deficits in strength, balance, and mobility in order to return to full function at home.     Rehab Potential  Good    PT Frequency  1x / week    PT Duration  12 weeks    PT Treatment/Interventions  Moist Heat;Manual techniques;Patient/family education;Therapeutic activities;Therapeutic exercise;Stair training;Neuromuscular re-education;Balance training;Scar mobilization;Energy conservation;Electrical Stimulation;Cryotherapy;Biofeedback;Functional mobility training;Taping;ADLs/Self Care Home Management;Aquatic Therapy;Canalith Repostioning;Iontophoresis 4mg /ml Dexamethasone;Traction;Ultrasound;DME Instruction;Gait training;Passive range of motion;Dry needling;Vestibular;Spinal Manipulations;Joint Manipulations    PT Next Visit Plan  Review and progress HEP, consider AFO practice    PT Home Exercise Plan  Sit to stand without UE support, sidelying clams, supine SLR, additional exercises provided by pelvic PT    Consulted and Agree with Plan of Care  Patient       Patient will benefit from skilled therapeutic intervention in order to improve the following deficits and impairments:  Abnormal gait, Improper body mechanics, Postural dysfunction, Decreased endurance, Decreased activity tolerance, Decreased strength, Decreased balance, Difficulty walking, Decreased mobility, Cardiopulmonary status limiting activity, Pain  Visit Diagnosis: Muscle weakness (generalized)  History of falling  Pain in left hip  Pain in right hip     Problem List  Patient Active Problem List   Diagnosis Date Noted   . Sepsis (HCC) 03/17/2018   Lynnea Maizes PT, DPT, GCS  , 08/31/2018, 9:55 AM  [place] [facility] MAIN [facility] SERVICES [address] [place], [place], [address] Phone: [phone]   Fax:  [phone]  Name: Taggert Constantinides MRN: [id number] Date of Birth: 1927/09/18

## 2018-09-01 ENCOUNTER — Ambulatory Visit: Payer: Medicare PPO

## 2018-09-06 ENCOUNTER — Ambulatory Visit: Payer: Medicare PPO

## 2018-09-07 ENCOUNTER — Encounter: Payer: Self-pay | Admitting: Physical Therapy

## 2018-09-07 ENCOUNTER — Ambulatory Visit: Payer: Medicare PPO | Admitting: Physical Therapy

## 2018-09-07 VITALS — BP 157/71 | HR 75

## 2018-09-07 DIAGNOSIS — M25551 Pain in right hip: Secondary | ICD-10-CM

## 2018-09-07 DIAGNOSIS — M6281 Muscle weakness (generalized): Secondary | ICD-10-CM | POA: Diagnosis not present

## 2018-09-07 DIAGNOSIS — Z9181 History of falling: Secondary | ICD-10-CM

## 2018-09-07 DIAGNOSIS — M25552 Pain in left hip: Secondary | ICD-10-CM

## 2018-09-07 NOTE — Therapy (Signed)
[place] [facility] MAIN [facility] SERVICES [address] [place], [place], [address] Phone: [phone]   Fax:  [phone]  Physical Therapy Treatment  Patient Details  Name: Caleb Khan MRN: [id number] Date of Birth: 1928-04-09 Referring Provider (PT): Dr. Burnadette Pop   Encounter Date: 09/07/2018  PT End of Session - 09/07/18 0814    Visit Number  3    Number of Visits  13    Date for PT Re-Evaluation  11/16/18    Authorization Type  Eval: 08/24/18    PT Start Time  0802    PT Stop Time  0845    PT Time Calculation (min)  43 min    Equipment Utilized During Treatment  Gait belt    Activity Tolerance  Patient tolerated treatment well    Behavior During Therapy  [facility] for tasks assessed/performed       Past Medical History:  Diagnosis Date  . Anemia   . Anginal pain (HCC)   . Arthritis    osteoarthritis  . Chronic kidney disease    stage 3  . COPD (chronic obstructive pulmonary disease) (HCC)    NO INHALERS  . Coronary artery disease   . Dyspnea    with exertion  . Hypertension   . Polymyalgia rheumatica (HCC)    TAKES PREDNISONE  . Spinal stenosis     Past Surgical History:  Procedure Laterality Date  . CARDIAC CATHETERIZATION Left 05/22/2016   Procedure: Left Heart Cath and Coronary Angiography;  Surgeon: Alwyn Pea, MD;  Location: [facility] INVASIVE CV LAB;  Service: Cardiovascular;  Laterality: Left;  . EYE SURGERY Bilateral    Cataract Extraction with IOL  . GREEN LIGHT LASER TURP (TRANSURETHRAL RESECTION OF PROSTATE N/A 04/26/2018   Procedure: GREEN LIGHT LASER TURP (TRANSURETHRAL RESECTION OF PROSTATE;  Surgeon: Orson Ape, MD;  Location: [facility] ORS;  Service: Urology;  Laterality: N/A;  . JOINT REPLACEMENT Right 2004   Shoulder Replacement  . JOINT REPLACEMENT Left 2007   Ankle Replacement  . PATELLAR TENDON REPAIR Left 07/15/2017   Procedure: PATELLA TENDON REPAIR;  Surgeon: Kennedy Bucker, MD;  Location: [facility] ORS;   Service: Orthopedics;  Laterality: Left;  Marland Kitchen QUADRICEPS TENDON REPAIR Left 07/15/2017   Procedure: REPAIR QUADRICEP TENDON;  Surgeon: Kennedy Bucker, MD;  Location: [facility] ORS;  Service: Orthopedics;  Laterality: Left;  . TONSILLECTOMY    . VASECTOMY      Vitals:   09/07/18 0807  BP: (!) 157/71  Pulse: 75  SpO2: 99%    Subjective Assessment - 09/07/18 0806    Subjective  Patient reports doing well; denies any pain or numbness in his legs; He reports that he is scheduled for a cardiac catherization next week;     Pertinent History  Pt was previously seeing pelvic health PT for urinary incontinence s/p laser TURP procedure October of 2019. With most of his urinary incontinence now resolved he was referred onward due to ongoing LE weakness. Pt reports a history of spinal stenosis with bilateral hip and BLE pain since July of 2019. He also reports bilateral hip OA. He had an MRI which showed severe spinal stenosis. He was told that he is not a surgical candidate. Pt struggles with his balance and reports approximately 4 falls in the last 6 months. He typically catches his toes resulting in falling forward. He intermittently uses hiking poles for ambulation. He reports DOE with very minimal walking and underwent pulmonary rehab in the early part of 2019 without  any improvement in his breathing. Pt would like to improve his leg strength, balance, and endurance with walking.    Limitations  Walking    Diagnostic tests  Lumbar MRI 03/02/2018: Severe L3-4 stenosis, Mod/severe L4-5 stenosis, Severe L5-S1 stenosis    Patient Stated Goals  Improve leg strength and endurance as well as improve balance    Pain Onset  More than a month ago        TREATMENT: Hooklying: Lumbar trunk rotation x1 min with cues to avoid painful ROM and to slow down LE movement for better control and stretch; Single knee to chest stretch 20 sec hold x2 reps bilaterally;  Posterior pelvic rock 5 sec hold x10 reps; required min VCs  for positioning and to avoid lifting up and avoid holding breath for better lower abdominal activation;  Hamstring neural stretch with hip flexion and knee flexion/extension 5 sec hold x5 reps; Hamstring neural stretch with SLR and ankle DF/PF x10 reps; required min VCs for positioning to improve neural stretch  Seated: Piriformis stretch with figure 4 positioning 20 sec hold x1 rep;  Strengthening: Green tband around BLE: Hip flexion march x15 reps bilaterally x2 sets Hip abduction/ER 2x15 with cues to avoid painful ROM.  Bridges with arms across chest x15 reps x2 sets SLR hip flexion 2# x10 bilaterally Patient able to exhibit good motor control with advanced exercise without increase in pain;   Patient tolerated session well. Instructed patient to avoid standing exercises at this time to avoid lumbar pain exacerbation. In addition, educated patient to hold off on all exercise until cardiologist clears him after cardiac cath. Patient verbalized understanding;                     PT Education - 09/07/18 0814    Education Details  HEP reinforced, strengthening, hooklying exercise    Person(s) Educated  Patient    Methods  Explanation;Verbal cues    Comprehension  Verbalized understanding;Returned demonstration;Verbal cues required;Need further instruction       PT Short Term Goals - 08/25/18 1244      PT SHORT TERM GOAL #1   Title  Pt will be independent with HEP in order to improve strength and balance in order to decrease fall risk and improve function at home and work.     Time  6    Period  Weeks    Status  New    Target Date  10/05/18        PT Long Term Goals - 08/25/18 1245      PT LONG TERM GOAL #1   Title  Pt will improve BERG by at least 3 points in order to demonstrate clinically significant improvement in balance.     Baseline  08/24/18: 52/56    Time  12    Period  Weeks    Status  New    Target Date  11/16/18      PT LONG TERM GOAL #2    Title  Pt will improve ABC by at least 13% in order to demonstrate clinically significant improvement in balance confidence.    Baseline  08/24/18: 61.875%    Time  12    Period  Weeks    Status  New    Target Date  11/16/18      PT LONG TERM GOAL #3   Title  Pt will decrease 5TSTS by at least 3 seconds in order to demonstrate clinically significant improvement in LE strength.  Baseline  08/24/18: 20.6 seconds    Time  12    Period  Weeks    Status  New    Target Date  11/16/18      PT LONG TERM GOAL #4   Title  Pt will increase 30 seconds sit to stand test to greater than 11 complete sit to stand to sit to demonstrate improvement in LE endurance and decrease risk for falls    Baseline  08/24/18: 7.5 times    Time  12    Period  Weeks    Status  New    Target Date  11/16/18            Plan - 09/07/18 0824    Clinical Impression Statement  Patient tolerated session well. He denies any increase in back pain or LE symptoms with advanced exercise. Patient responded welll to chest with better mobility and motor control. He is planning on having cardiac catherization next week and will miss approximately 2 weeks of PT due to cardiac issues. Instructed patient he will need cardiac clearance before returning to therapy; Patient would benefit from additional skilled PT Intervention to improve strength, balance and gait safety;     Rehab Potential  Good    PT Frequency  1x / week    PT Duration  12 weeks    PT Treatment/Interventions  Moist Heat;Manual techniques;Patient/family education;Therapeutic activities;Therapeutic exercise;Stair training;Neuromuscular re-education;Balance training;Scar mobilization;Energy conservation;Electrical Stimulation;Cryotherapy;Biofeedback;Functional mobility training;Taping;ADLs/Self Care Home Management;Aquatic Therapy;Canalith Repostioning;Iontophoresis 4mg /ml Dexamethasone;Traction;Ultrasound;DME Instruction;Gait training;Passive range of motion;Dry  needling;Vestibular;Spinal Manipulations;Joint Manipulations    PT Next Visit Plan  Review and progress HEP, consider AFO practice    PT Home Exercise Plan  Sit to stand without UE support, sidelying clams, supine SLR, additional exercises provided by pelvic PT    Consulted and Agree with Plan of Care  Patient       Patient will benefit from skilled therapeutic intervention in order to improve the following deficits and impairments:  Abnormal gait, Improper body mechanics, Postural dysfunction, Decreased endurance, Decreased activity tolerance, Decreased strength, Decreased balance, Difficulty walking, Decreased mobility, Cardiopulmonary status limiting activity, Pain  Visit Diagnosis: Muscle weakness (generalized)  History of falling  Pain in left hip  Pain in right hip     Problem List Patient Active Problem List   Diagnosis Date Noted  . Sepsis (HCC) 03/17/2018    Trotter,Margaret PT, DPT 09/07/2018, 8:26 AM  [place] [facility] MAIN [facility] SERVICES [address] [place], [place], [address] Phone: [phone]   Fax:  [phone]  Name: Caleb Khan MRN: [id number] Date of Birth: 30-Aug-1927

## 2018-09-08 ENCOUNTER — Ambulatory Visit: Payer: Medicare PPO

## 2018-09-13 ENCOUNTER — Ambulatory Visit: Payer: Medicare PPO

## 2018-09-15 ENCOUNTER — Ambulatory Visit
Admission: RE | Admit: 2018-09-15 | Discharge: 2018-09-15 | Disposition: A | Discharge status: Home / Self Care | Payer: Medicare PPO | Attending: Internal Medicine | Admitting: Internal Medicine

## 2018-09-15 ENCOUNTER — Other Ambulatory Visit: Payer: Self-pay

## 2018-09-15 ENCOUNTER — Ambulatory Visit: Payer: Medicare PPO

## 2018-09-15 ENCOUNTER — Encounter
Admission: RE | Disposition: A | Discharge status: Home / Self Care | Payer: Self-pay | Source: Home / Self Care | Attending: Internal Medicine

## 2018-09-15 ENCOUNTER — Encounter: Payer: Self-pay | Admitting: *Deleted

## 2018-09-15 DIAGNOSIS — N400 Enlarged prostate without lower urinary tract symptoms: Secondary | ICD-10-CM | POA: Insufficient documentation

## 2018-09-15 DIAGNOSIS — I25119 Atherosclerotic heart disease of native coronary artery with unspecified angina pectoris: Secondary | ICD-10-CM | POA: Diagnosis not present

## 2018-09-15 DIAGNOSIS — J449 Chronic obstructive pulmonary disease, unspecified: Secondary | ICD-10-CM | POA: Insufficient documentation

## 2018-09-15 DIAGNOSIS — I10 Essential (primary) hypertension: Secondary | ICD-10-CM | POA: Diagnosis not present

## 2018-09-15 DIAGNOSIS — R079 Chest pain, unspecified: Secondary | ICD-10-CM | POA: Diagnosis present

## 2018-09-15 DIAGNOSIS — G4730 Sleep apnea, unspecified: Secondary | ICD-10-CM | POA: Diagnosis not present

## 2018-09-15 DIAGNOSIS — Z87891 Personal history of nicotine dependence: Secondary | ICD-10-CM | POA: Insufficient documentation

## 2018-09-15 DIAGNOSIS — R0602 Shortness of breath: Secondary | ICD-10-CM | POA: Insufficient documentation

## 2018-09-15 DIAGNOSIS — Z79899 Other long term (current) drug therapy: Secondary | ICD-10-CM | POA: Insufficient documentation

## 2018-09-15 DIAGNOSIS — M353 Polymyalgia rheumatica: Secondary | ICD-10-CM | POA: Diagnosis not present

## 2018-09-15 DIAGNOSIS — E785 Hyperlipidemia, unspecified: Secondary | ICD-10-CM | POA: Diagnosis not present

## 2018-09-15 DIAGNOSIS — Z7982 Long term (current) use of aspirin: Secondary | ICD-10-CM | POA: Insufficient documentation

## 2018-09-15 HISTORY — PX: LEFT HEART CATH AND CORONARY ANGIOGRAPHY: CATH118249

## 2018-09-15 HISTORY — PX: INTRAVASCULAR PRESSURE WIRE/FFR STUDY: CATH118243

## 2018-09-15 SURGERY — LEFT HEART CATH AND CORONARY ANGIOGRAPHY
Anesthesia: Moderate Sedation

## 2018-09-15 MED ORDER — IOPAMIDOL (ISOVUE-300) INJECTION 61%
INTRAVENOUS | Status: DC | PRN
  Administered 2018-09-15: 170 mL via INTRA_ARTERIAL

## 2018-09-15 MED ORDER — SODIUM CHLORIDE 0.9 % IV SOLN: 250.0000 mL | INTRAVENOUS | Status: DC | PRN

## 2018-09-15 MED ORDER — MIDAZOLAM HCL 2 MG/2ML IJ SOLN
INTRAMUSCULAR | Status: DC | PRN
  Administered 2018-09-15: 1 mg via INTRAVENOUS

## 2018-09-15 MED ORDER — LABETALOL HCL 5 MG/ML IV SOLN
INTRAVENOUS | Status: AC
  Filled 2018-09-15: qty 4

## 2018-09-15 MED ORDER — VERAPAMIL HCL 2.5 MG/ML IV SOLN
INTRAVENOUS | Status: AC
  Filled 2018-09-15: qty 2

## 2018-09-15 MED ORDER — LABETALOL HCL 5 MG/ML IV SOLN
INTRAVENOUS | Status: DC | PRN
  Administered 2018-09-15 (×2): 10 mg via INTRAVENOUS

## 2018-09-15 MED ORDER — HEPARIN SODIUM (PORCINE) 1000 UNIT/ML IJ SOLN
INTRAMUSCULAR | Status: AC
  Filled 2018-09-15: qty 1

## 2018-09-15 MED ORDER — ASPIRIN 81 MG PO CHEW
81.0000 mg | CHEWABLE_TABLET | ORAL | Status: AC
  Administered 2018-09-15: 81 mg via ORAL

## 2018-09-15 MED ORDER — FENTANYL CITRATE (PF) 100 MCG/2ML IJ SOLN
INTRAMUSCULAR | Status: AC
  Filled 2018-09-15: qty 2

## 2018-09-15 MED ORDER — SODIUM CHLORIDE 0.9% FLUSH: 3.0000 mL | INTRAVENOUS | Status: DC | PRN

## 2018-09-15 MED ORDER — ACETAMINOPHEN 325 MG PO TABS: 650.0000 mg | ORAL_TABLET | ORAL | Status: DC | PRN

## 2018-09-15 MED ORDER — FENTANYL CITRATE (PF) 100 MCG/2ML IJ SOLN
INTRAMUSCULAR | Status: DC | PRN
  Administered 2018-09-15: 25 ug via INTRAVENOUS

## 2018-09-15 MED ORDER — SODIUM CHLORIDE 0.9% FLUSH: 3.0000 mL | Freq: Two times a day (BID) | INTRAVENOUS | Status: DC

## 2018-09-15 MED ORDER — SODIUM CHLORIDE 0.9 % WEIGHT BASED INFUSION
1.0000 mL/kg/h | INTRAVENOUS | Status: DC
  Administered 2018-09-15: 1 mL/kg/h via INTRAVENOUS

## 2018-09-15 MED ORDER — SODIUM CHLORIDE 0.9 % WEIGHT BASED INFUSION
3.0000 mL/kg/h | INTRAVENOUS | Status: AC
  Administered 2018-09-15: 3 mL/kg/h via INTRAVENOUS

## 2018-09-15 MED ORDER — ASPIRIN 81 MG PO CHEW
CHEWABLE_TABLET | ORAL | Status: AC
  Filled 2018-09-15: qty 1

## 2018-09-15 MED ORDER — ONDANSETRON HCL 4 MG/2ML IJ SOLN: 4.0000 mg | Freq: Four times a day (QID) | INTRAMUSCULAR | Status: DC | PRN

## 2018-09-15 MED ORDER — MIDAZOLAM HCL 2 MG/2ML IJ SOLN
INTRAMUSCULAR | Status: AC
  Filled 2018-09-15: qty 2

## 2018-09-15 MED ORDER — HEPARIN (PORCINE) IN NACL 1000-0.9 UT/500ML-% IV SOLN
INTRAVENOUS | Status: AC
  Filled 2018-09-15: qty 1000

## 2018-09-15 MED ORDER — HEPARIN SODIUM (PORCINE) 1000 UNIT/ML IJ SOLN
INTRAMUSCULAR | Status: DC | PRN
  Administered 2018-09-15 (×2): 3200 [IU] via INTRAVENOUS

## 2018-09-15 MED ORDER — VERAPAMIL HCL 2.5 MG/ML IV SOLN
INTRAVENOUS | Status: DC | PRN
  Administered 2018-09-15: 2.5 mg via INTRAVENOUS

## 2018-09-15 SURGICAL SUPPLY — 10 items
CATH INFINITI 5 FR JL3.5 (CATHETERS) ×3 IMPLANT
CATH INFINITI JR4 5F (CATHETERS) ×3 IMPLANT
CATH VISTA GUIDE 6FR JL3.5 (CATHETERS) ×3 IMPLANT
DEVICE INFLAT 30 PLUS (MISCELLANEOUS) ×3 IMPLANT
GLIDESHEATH SLEND SS 6F .021 (SHEATH) ×3 IMPLANT
KIT MANI 3VAL PERCEP (MISCELLANEOUS) ×3 IMPLANT
PACK CARDIAC CATH (CUSTOM PROCEDURE TRAY) ×3 IMPLANT
WIRE HITORQ VERSACORE ST 145CM (WIRE) ×3 IMPLANT
WIRE PRESSURE VERRATA (WIRE) ×3 IMPLANT
WIRE ROSEN-J .035X260CM (WIRE) ×3 IMPLANT

## 2018-09-16 ENCOUNTER — Encounter: Payer: Self-pay | Admitting: Internal Medicine

## 2018-09-16 LAB — POCT ACTIVATED CLOTTING TIME: Activated Clotting Time: 180 seconds

## 2018-09-20 ENCOUNTER — Ambulatory Visit: Payer: Medicare PPO

## 2018-09-22 ENCOUNTER — Ambulatory Visit: Payer: Medicare PPO

## 2018-09-27 ENCOUNTER — Ambulatory Visit: Payer: Medicare PPO | Attending: Urology

## 2018-09-27 DIAGNOSIS — G8929 Other chronic pain: Secondary | ICD-10-CM | POA: Diagnosis present

## 2018-09-27 DIAGNOSIS — M25551 Pain in right hip: Secondary | ICD-10-CM | POA: Insufficient documentation

## 2018-09-27 DIAGNOSIS — M6281 Muscle weakness (generalized): Secondary | ICD-10-CM | POA: Diagnosis not present

## 2018-09-27 DIAGNOSIS — M545 Low back pain: Secondary | ICD-10-CM | POA: Insufficient documentation

## 2018-09-27 DIAGNOSIS — M25552 Pain in left hip: Secondary | ICD-10-CM | POA: Insufficient documentation

## 2018-09-27 DIAGNOSIS — Z9181 History of falling: Secondary | ICD-10-CM | POA: Diagnosis present

## 2018-09-27 DIAGNOSIS — R2689 Other abnormalities of gait and mobility: Secondary | ICD-10-CM | POA: Diagnosis present

## 2018-09-27 NOTE — Therapy (Signed)
[place] [facility] MAIN [facility] SERVICES [address] [place], [place], [address] Phone: [phone]   Fax:  [phone]  Physical Therapy Treatment  Patient Details  Name: Caleb Khan MRN: [id number] Date of Birth: 1928-05-17 Referring Provider (PT): Dr. Burnadette Pop   Encounter Date: 09/27/2018  PT End of Session - 09/27/18 1031    Visit Number  4    Number of Visits  13    Date for PT Re-Evaluation  11/16/18    Authorization Type  Eval: 08/24/18    Authorization Time Period  4/10    PT Start Time  0846    PT Stop Time  0930    PT Time Calculation (min)  44 min    Equipment Utilized During Treatment  Gait belt    Activity Tolerance  Patient tolerated treatment well    Behavior During Therapy  [facility] for tasks assessed/performed       Past Medical History:  Diagnosis Date  . Anemia   . Anginal pain (HCC)   . Arthritis    osteoarthritis  . Chronic kidney disease    stage 3  . COPD (chronic obstructive pulmonary disease) (HCC)    NO INHALERS  . Coronary artery disease   . Dyspnea    with exertion  . Hypertension   . Polymyalgia rheumatica (HCC)    TAKES PREDNISONE  . Spinal stenosis     Past Surgical History:  Procedure Laterality Date  . CARDIAC CATHETERIZATION Left 05/22/2016   Procedure: Left Heart Cath and Coronary Angiography;  Surgeon: Alwyn Pea, MD;  Location: [facility] INVASIVE CV LAB;  Service: Cardiovascular;  Laterality: Left;  . EYE SURGERY Bilateral    Cataract Extraction with IOL  . GREEN LIGHT LASER TURP (TRANSURETHRAL RESECTION OF PROSTATE N/A 04/26/2018   Procedure: GREEN LIGHT LASER TURP (TRANSURETHRAL RESECTION OF PROSTATE;  Surgeon: Orson Ape, MD;  Location: [facility] ORS;  Service: Urology;  Laterality: N/A;  . INTRAVASCULAR PRESSURE WIRE/FFR STUDY N/A 09/15/2018   Procedure: INTRAVASCULAR PRESSURE WIRE/FFR STUDY;  Surgeon: Alwyn Pea, MD;  Location: [facility] INVASIVE CV LAB;  Service: Cardiovascular;   Laterality: N/A;  . JOINT REPLACEMENT Right 2004   Shoulder Replacement  . JOINT REPLACEMENT Left 2007   Ankle Replacement  . LEFT HEART CATH AND CORONARY ANGIOGRAPHY Left 09/15/2018   Procedure: LEFT HEART CATH AND CORONARY ANGIOGRAPHY;  Surgeon: Alwyn Pea, MD;  Location: [facility] INVASIVE CV LAB;  Service: Cardiovascular;  Laterality: Left;  . PATELLAR TENDON REPAIR Left 07/15/2017   Procedure: PATELLA TENDON REPAIR;  Surgeon: Kennedy Bucker, MD;  Location: [facility] ORS;  Service: Orthopedics;  Laterality: Left;  Marland Kitchen QUADRICEPS TENDON REPAIR Left 07/15/2017   Procedure: REPAIR QUADRICEP TENDON;  Surgeon: Kennedy Bucker, MD;  Location: [facility] ORS;  Service: Orthopedics;  Laterality: Left;  . TONSILLECTOMY    . VASECTOMY      There were no vitals filed for this visit.  Subjective Assessment - 09/27/18 0855    Subjective  Patient presents s/p heart cath 2/20. Reports echocardiogram didn't show much. Patient reports he is more dizzy and LE fatigued today since he woke up    Pertinent History  Pt was previously seeing pelvic health PT for urinary incontinence s/p laser TURP procedure October of 2019. With most of his urinary incontinence now resolved he was referred onward due to ongoing LE weakness. Pt reports a history of spinal stenosis with bilateral hip and BLE pain since July of 2019. He also reports bilateral hip  OA. He had an MRI which showed severe spinal stenosis. He was told that he is not a surgical candidate. Pt struggles with his balance and reports approximately 4 falls in the last 6 months. He typically catches his toes resulting in falling forward. He intermittently uses hiking poles for ambulation. He reports DOE with very minimal walking and underwent pulmonary rehab in the early part of 2019 without any improvement in his breathing. Pt would like to improve his leg strength, balance, and endurance with walking.    Limitations  Walking    Diagnostic tests  Lumbar MRI 03/02/2018: Severe  L3-4 stenosis, Mod/severe L4-5 stenosis, Severe L5-S1 stenosis    Patient Stated Goals  Improve leg strength and endurance as well as improve balance    Currently in Pain?  No/denies      Patient presents s/p heart cath 2/20. Reports echocardiogram didn't show much. Patient reports he is more dizzy and LE fatigued today since he woke up.   122/66  129/49 after standing marches  119/54   TREATMENT: Hooklying: Lumbar trunk rotation x1 min with cues to avoid painful ROM and to slow down LE movement for better control and stretch; Single knee to chest 2x 30 seconds each LE TrA activation with GTB around knees marching, verbal cueing for exhale with knee motion for core activation.  Hamstring neural stretch with SLR and ankle DF/PF x10 reps; required min VCs for positioning to improve neural stretch   Standing: Marches CGA 10x each LE slight instability with decreased weight acceptance onto RLE. HR and BP monitored. Sit to stand from raised plinth table 10x with decreased weight acceptance onto RLE due to pain. Verbal cueing for bringing nose over toes.   Seated:  3lb ankle weights marching 10x each LE, verbal cueing for forward trunk position for abdominal activation  Seated GTB hamstring curl 10x each LE Seated coordination taps on 6" step 3x30 seconds     Patient able to exhibit good motor control with advanced exercise without increase in pain;  Patient tolerated session well. Instructed patient to avoid standing exercises at this time to avoid lumbar pain exacerbation.                         PT Education - 09/27/18 1031    Education Details  HEP reinforced, vitals monitoring, added seated marches and toe taps to HEP     Person(s) Educated  Patient    Methods  Explanation;Demonstration;Tactile cues;Verbal cues    Comprehension  Verbalized understanding;Returned demonstration;Verbal cues required;Tactile cues required;Need further instruction       PT Short  Term Goals - 08/25/18 1244      PT SHORT TERM GOAL #1   Title  Pt will be independent with HEP in order to improve strength and balance in order to decrease fall risk and improve function at home and work.     Time  6    Period  Weeks    Status  New    Target Date  10/05/18        PT Long Term Goals - 08/25/18 1245      PT LONG TERM GOAL #1   Title  Pt will improve BERG by at least 3 points in order to demonstrate clinically significant improvement in balance.     Baseline  08/24/18: 52/56    Time  12    Period  Weeks    Status  New    Target Date  11/16/18      PT LONG TERM GOAL #2   Title  Pt will improve ABC by at least 13% in order to demonstrate clinically significant improvement in balance confidence.    Baseline  08/24/18: 61.875%    Time  12    Period  Weeks    Status  New    Target Date  11/16/18      PT LONG TERM GOAL #3   Title  Pt will decrease 5TSTS by at least 3 seconds in order to demonstrate clinically significant improvement in LE strength.    Baseline  08/24/18: 20.6 seconds    Time  12    Period  Weeks    Status  New    Target Date  11/16/18      PT LONG TERM GOAL #4   Title  Pt will increase 30 seconds sit to stand test to greater than 11 complete sit to stand to sit to demonstrate improvement in LE endurance and decrease risk for falls    Baseline  08/24/18: 7.5 times    Time  12    Period  Weeks    Status  New    Target Date  11/16/18            Plan - 09/27/18 1036    Clinical Impression Statement  Patient has received cardiac cath and reports his physician has cleared him for exercise. Blood pressure monitored throughout session to assess tolerance to interventions. Limited tolerance in standing noted returning to seated position. Sit to stands challenging to patient at this time due to R knee pain, interventions applied to reduce pain and improve mobility via altering body mechanics.  Patient would benefit from additional skilled PT  Intervention to improve strength, balance and gait safety    Rehab Potential  Good    PT Frequency  1x / week    PT Duration  12 weeks    PT Treatment/Interventions  Moist Heat;Manual techniques;Patient/family education;Therapeutic activities;Therapeutic exercise;Stair training;Neuromuscular re-education;Balance training;Scar mobilization;Energy conservation;Electrical Stimulation;Cryotherapy;Biofeedback;Functional mobility training;Taping;ADLs/Self Care Home Management;Aquatic Therapy;Canalith Repostioning;Iontophoresis 4mg /ml Dexamethasone;Traction;Ultrasound;DME Instruction;Gait training;Passive range of motion;Dry needling;Vestibular;Spinal Manipulations;Joint Manipulations    PT Next Visit Plan  Review and progress HEP, consider AFO practice    PT Home Exercise Plan  Sit to stand without UE support, sidelying clams, supine SLR, additional exercises provided by pelvic PT    Consulted and Agree with Plan of Care  Patient       Patient will benefit from skilled therapeutic intervention in order to improve the following deficits and impairments:  Abnormal gait, Improper body mechanics, Postural dysfunction, Decreased endurance, Decreased activity tolerance, Decreased strength, Decreased balance, Difficulty walking, Decreased mobility, Cardiopulmonary status limiting activity, Pain  Visit Diagnosis: Muscle weakness (generalized)  History of falling  Pain in left hip  Pain in right hip     Problem List Patient Active Problem List   Diagnosis Date Noted  . Sepsis (HCC) 03/17/2018   Precious Bard, PT, DPT   09/27/2018, 10:38 AM  [place] [facility] MAIN [facility] SERVICES [address] [place], [place], [address] Phone: [phone]   Fax:  [phone]  Name: Caleb Khan MRN: [id number] Date of Birth: [birth year]

## 2018-09-29 ENCOUNTER — Ambulatory Visit: Payer: Medicare PPO

## 2018-10-04 ENCOUNTER — Ambulatory Visit: Payer: Medicare PPO

## 2018-10-04 VITALS — BP 116/56 | HR 66

## 2018-10-04 DIAGNOSIS — G8929 Other chronic pain: Secondary | ICD-10-CM

## 2018-10-04 DIAGNOSIS — M6281 Muscle weakness (generalized): Secondary | ICD-10-CM

## 2018-10-04 DIAGNOSIS — M545 Low back pain: Secondary | ICD-10-CM

## 2018-10-04 NOTE — Patient Instructions (Signed)
Access Code: [email or web address]  URL: [email or web address]  Date: 10/04/2018  Prepared by: Ria Comment   Exercises  Supine Bridge - 10 reps - 2 sets - 3 seconds hold - 1x daily - 7x weekly  Seated Figure 4 Piriformis Stretch - 3 reps - 30-45 seconds on each side hold - 2x daily - 7x weekly  Seated March with Resistance - 10 reps - 2 sets - 3 seconds hold - 1x daily - 7x weekly  Alternating Seated Step Taps - 10 reps - 2 sets - 1x daily - 7x weekly  Seated Hip Abduction - 10 reps - 2 sets - 3 seconds hold - 1x daily - 7x weekly  Seated Hip Adduction Squeeze with Ball - 10 reps - 2 sets - 3 seconds hold - 1x daily - 7x weekly  Lunge with Counter Support - 10 reps - 2 sets - (10 repetitions on each side) hold - 1x daily - 7x weekly

## 2018-10-04 NOTE — Therapy (Signed)
[place] [facility] MAIN [facility] SERVICES [address] [place], [place], [address] Phone: [phone]   Fax:  [phone]  Physical Therapy Treatment  Patient Details  Name: Caleb Khan MRN: [id number] Date of Birth: 09/28/1927 Referring Provider (PT): Dr. Burnadette Pop   Encounter Date: 10/04/2018  PT End of Session - 10/04/18 0915    Visit Number  5    Number of Visits  13    Date for PT Re-Evaluation  11/16/18    Authorization Type  Eval: 08/24/18    Authorization Time Period  --    PT Start Time  0845    PT Stop Time  0930    PT Time Calculation (min)  45 min    Equipment Utilized During Treatment  Gait belt    Activity Tolerance  Patient tolerated treatment well    Behavior During Therapy  [facility] for tasks assessed/performed       Past Medical History:  Diagnosis Date  . Anemia   . Anginal pain (HCC)   . Arthritis    osteoarthritis  . Chronic kidney disease    stage 3  . COPD (chronic obstructive pulmonary disease) (HCC)    NO INHALERS  . Coronary artery disease   . Dyspnea    with exertion  . Hypertension   . Polymyalgia rheumatica (HCC)    TAKES PREDNISONE  . Spinal stenosis     Past Surgical History:  Procedure Laterality Date  . CARDIAC CATHETERIZATION Left 05/22/2016   Procedure: Left Heart Cath and Coronary Angiography;  Surgeon: Alwyn Pea, MD;  Location: [facility] INVASIVE CV LAB;  Service: Cardiovascular;  Laterality: Left;  . EYE SURGERY Bilateral    Cataract Extraction with IOL  . GREEN LIGHT LASER TURP (TRANSURETHRAL RESECTION OF PROSTATE N/A 04/26/2018   Procedure: GREEN LIGHT LASER TURP (TRANSURETHRAL RESECTION OF PROSTATE;  Surgeon: Orson Ape, MD;  Location: [facility] ORS;  Service: Urology;  Laterality: N/A;  . INTRAVASCULAR PRESSURE WIRE/FFR STUDY N/A 09/15/2018   Procedure: INTRAVASCULAR PRESSURE WIRE/FFR STUDY;  Surgeon: Alwyn Pea, MD;  Location: [facility] INVASIVE CV LAB;  Service: Cardiovascular;   Laterality: N/A;  . JOINT REPLACEMENT Right 2004   Shoulder Replacement  . JOINT REPLACEMENT Left 2007   Ankle Replacement  . LEFT HEART CATH AND CORONARY ANGIOGRAPHY Left 09/15/2018   Procedure: LEFT HEART CATH AND CORONARY ANGIOGRAPHY;  Surgeon: Alwyn Pea, MD;  Location: [facility] INVASIVE CV LAB;  Service: Cardiovascular;  Laterality: Left;  . PATELLAR TENDON REPAIR Left 07/15/2017   Procedure: PATELLA TENDON REPAIR;  Surgeon: Kennedy Bucker, MD;  Location: [facility] ORS;  Service: Orthopedics;  Laterality: Left;  Marland Kitchen QUADRICEPS TENDON REPAIR Left 07/15/2017   Procedure: REPAIR QUADRICEP TENDON;  Surgeon: Kennedy Bucker, MD;  Location: [facility] ORS;  Service: Orthopedics;  Laterality: Left;  . TONSILLECTOMY    . VASECTOMY      Vitals:   10/04/18 0851 10/04/18 0852  BP: (!) 116/56   Pulse: 66   SpO2: 97% 97%    Subjective Assessment - 10/04/18 0851    Subjective  Pt reports that he is feeling somewhat lightheaded this morning. He took his BP this morning and it was a little low. Otherwise no specific questions or concerns.     Pertinent History  Pt was previously seeing pelvic health PT for urinary incontinence s/p laser TURP procedure October of 2019. With most of his urinary incontinence now resolved he was referred onward due to ongoing LE weakness. Pt reports a history  of spinal stenosis with bilateral hip and BLE pain since July of 2019. He also reports bilateral hip OA. He had an MRI which showed severe spinal stenosis. He was told that he is not a surgical candidate. Pt struggles with his balance and reports approximately 4 falls in the last 6 months. He typically catches his toes resulting in falling forward. He intermittently uses hiking poles for ambulation. He reports DOE with very minimal walking and underwent pulmonary rehab in the early part of 2019 without any improvement in his breathing. Pt would like to improve his leg strength, balance, and endurance with walking.    Diagnostic  tests  Lumbar MRI 03/02/2018: Severe L3-4 stenosis, Mod/severe L4-5 stenosis, Severe L5-S1 stenosis    Patient Stated Goals  Improve leg strength and endurance as well as improve balance    Currently in Pain?  No/denies          TREATMENT   Ther-ex  BP checked in sitting and standing without signs of hypotension or clinically significant orthostatic changes. HR is controlled but appears regularly irregular based on radial palpation and auscultation. Pt encouraged to contact his cardiologist to ask if he has an irregular heartbeat.  Seated marches with green tband around knees 2 x 15 bilateral; Seated clams with green tband around knees 2 x 15; Seated adductor ball squeeze 2 x 15; Seated heel raises with manual resistance 2 x 15; Extensive HEP review including consolidation of home program and new print out with detailed instructions;   Pt educated throughout session about proper posture and technique with exercises. Improved exercise technique, movement at target joints, use of target muscles after min to mod verbal, visual, tactile cues.    Patient has received cardiac cath and reports his physician has cleared him for exercise. He reports feeling mildly lightheaded this morning. Blood pressure appears WNL and no signs of significant orthostatic changes. HR appears regularly irregular. Unable to find records of pt with an irregular heart rhythm. Pt encouraged to contact his cardiologist to notify of his lightheadedness and irregular heart rhythm this morning. Extensive time spent with patient reviewing HEP and consolidating onto new print-out with clear details. He will need updated outcome measures and goals at his next visit. Patient would benefit from additional skilled PT Intervention to improve strength, balance and gait safety.                        PT Short Term Goals - 08/25/18 1244      PT SHORT TERM GOAL #1   Title  Pt will be independent with HEP in  order to improve strength and balance in order to decrease fall risk and improve function at home and work.     Time  6    Period  Weeks    Status  New    Target Date  10/05/18        PT Long Term Goals - 08/25/18 1245      PT LONG TERM GOAL #1   Title  Pt will improve BERG by at least 3 points in order to demonstrate clinically significant improvement in balance.     Baseline  08/24/18: 52/56    Time  12    Period  Weeks    Status  New    Target Date  11/16/18      PT LONG TERM GOAL #2   Title  Pt will improve ABC by at least 13% in order to  demonstrate clinically significant improvement in balance confidence.    Baseline  08/24/18: 61.875%    Time  12    Period  Weeks    Status  New    Target Date  11/16/18      PT LONG TERM GOAL #3   Title  Pt will decrease 5TSTS by at least 3 seconds in order to demonstrate clinically significant improvement in LE strength.    Baseline  08/24/18: 20.6 seconds    Time  12    Period  Weeks    Status  New    Target Date  11/16/18      PT LONG TERM GOAL #4   Title  Pt will increase 30 seconds sit to stand test to greater than 11 complete sit to stand to sit to demonstrate improvement in LE endurance and decrease risk for falls    Baseline  08/24/18: 7.5 times    Time  12    Period  Weeks    Status  New    Target Date  11/16/18            Plan - 10/04/18 0915    Clinical Impression Statement  Patient has received cardiac cath and reports his physician has cleared him for exercise. He reports feeling mildly lightheaded this morning. Blood pressure appears WNL and no signs of significant orthostatic changes. HR appears regularly irregular. Unable to find records of pt with an irregular heart rhythm. Pt encouraged to contact his cardiologist to notify of his lightheadedness and irregular heart rhythm this morning. Extensive time spent with patient reviewing HEP and consolidating onto new print-out with clear details. He will need updated  outcome measures and goals at his next visit. Patient would benefit from additional skilled PT Intervention to improve strength, balance and gait safety.     Clinical Decision Making  Moderate    Rehab Potential  Good    PT Frequency  1x / week    PT Duration  12 weeks    PT Treatment/Interventions  Moist Heat;Manual techniques;Patient/family education;Therapeutic activities;Therapeutic exercise;Stair training;Neuromuscular re-education;Balance training;Scar mobilization;Energy conservation;Electrical Stimulation;Cryotherapy;Biofeedback;Functional mobility training;Taping;ADLs/Self Care Home Management;Aquatic Therapy;Canalith Repostioning;Iontophoresis 4mg /ml Dexamethasone;Traction;Ultrasound;DME Instruction;Gait training;Passive range of motion;Dry needling;Vestibular;Spinal Manipulations;Joint Manipulations    PT Next Visit Plan  Update outcome measures/goals, Review and progress HEP, consider AFO practice?    PT Home Exercise Plan  Sit to stand without UE support, sidelying clams, supine SLR, additional exercises provided by pelvic PT, ([id number])    Consulted and Agree with Plan of Care  Patient       Patient will benefit from skilled therapeutic intervention in order to improve the following deficits and impairments:  Abnormal gait, Improper body mechanics, Postural dysfunction, Decreased endurance, Decreased activity tolerance, Decreased strength, Decreased balance, Difficulty walking, Decreased mobility, Cardiopulmonary status limiting activity, Pain  Visit Diagnosis: Muscle weakness (generalized)  Chronic bilateral low back pain, unspecified whether sciatica present     Problem List Patient Active Problem List   Diagnosis Date Noted  . Sepsis (HCC) 03/17/2018   Lynnea Maizes PT, DPT, GCS  Alvin Rubano 10/05/2018, 8:19 AM  [place] [facility] MAIN [facility] SERVICES [address] [place], [place], [address] Phone: [phone]   Fax:   [phone]  Name: Caleb Khan MRN: [id number] Date of Birth: 1928-02-06

## 2018-10-06 ENCOUNTER — Ambulatory Visit: Payer: Medicare PPO

## 2018-10-11 ENCOUNTER — Other Ambulatory Visit: Payer: Self-pay

## 2018-10-11 ENCOUNTER — Ambulatory Visit: Payer: Medicare PPO

## 2018-10-11 VITALS — BP 141/71 | HR 65

## 2018-10-11 DIAGNOSIS — R2689 Other abnormalities of gait and mobility: Secondary | ICD-10-CM

## 2018-10-11 DIAGNOSIS — M6281 Muscle weakness (generalized): Secondary | ICD-10-CM | POA: Diagnosis not present

## 2018-10-11 NOTE — Therapy (Signed)
[place] MAIN [facility] SERVICES [address] [place], [place], [address] Phone: [phone]   Fax:  [phone]  Physical Therapy Treatment/Discharge  Patient Details  Name: Caleb Khan MRN: [id number] Date of Birth: [birth year] Referring Provider (PT): Dr. Netty Starring   Encounter Date: 10/11/2018  PT End of Session - 10/11/18 0854    Visit Number  6    Number of Visits  13    Date for PT Re-Evaluation  11/16/18    Authorization Type  Eval: 08/24/18, goals updated 10/11/18    PT Start Time  0847    PT Stop Time  0930    PT Time Calculation (min)  43 min    Equipment Utilized During Treatment  Gait belt    Activity Tolerance  Patient tolerated treatment well    Behavior During Therapy  [facility] for tasks assessed/performed       Past Medical History:  Diagnosis Date  . Anemia   . Anginal pain ([place])   . Arthritis    osteoarthritis  . Chronic kidney disease    stage 3  . COPD (chronic obstructive pulmonary disease) ([place])    NO INHALERS  . Coronary artery disease   . Dyspnea    with exertion  . Hypertension   . Polymyalgia rheumatica ([facility])    TAKES PREDNISONE  . Spinal stenosis     Past Surgical History:  Procedure Laterality Date  . CARDIAC CATHETERIZATION Left 05/22/2016   Procedure: Left Heart Cath and Coronary Angiography;  Surgeon: Yolonda Kida, MD;  Location: [place] [facility];  Service: Cardiovascular;  Laterality: Left;  . EYE SURGERY Bilateral    Cataract Extraction with IOL  . GREEN LIGHT LASER TURP (TRANSURETHRAL RESECTION OF PROSTATE N/A 04/26/2018   Procedure: GREEN LIGHT LASER TURP (TRANSURETHRAL RESECTION OF PROSTATE;  Surgeon: Royston Cowper, MD;  Location: [facility];  Service: Urology;  Laterality: N/A;  . INTRAVASCULAR PRESSURE WIRE/FFR STUDY N/A 09/15/2018   Procedure: INTRAVASCULAR PRESSURE WIRE/FFR STUDY;  Surgeon: Yolonda Kida, MD;  Location: [place] [facility];  Service: Cardiovascular;   Laterality: N/A;  . JOINT REPLACEMENT Right 2004   Shoulder Replacement  . JOINT REPLACEMENT Left 2007   Ankle Replacement  . LEFT HEART CATH AND CORONARY ANGIOGRAPHY Left 09/15/2018   Procedure: LEFT HEART CATH AND CORONARY ANGIOGRAPHY;  Surgeon: Yolonda Kida, MD;  Location: [place] [facility];  Service: Cardiovascular;  Laterality: Left;  . PATELLAR TENDON REPAIR Left 07/15/2017   Procedure: PATELLA TENDON REPAIR;  Surgeon: Hessie Knows, MD;  Location: [facility];  Service: Orthopedics;  Laterality: Left;  Marland Kitchen QUADRICEPS TENDON REPAIR Left 07/15/2017   Procedure: REPAIR QUADRICEP TENDON;  Surgeon: Hessie Knows, MD;  Location: [facility];  Service: Orthopedics;  Laterality: Left;  . TONSILLECTOMY    . VASECTOMY      Vitals:   10/11/18 0852  BP: (!) 141/71  Pulse: 65  SpO2: 100%    Subjective Assessment - 10/11/18 0845    Subjective  Pt states that he is doing well today. He would like to make today his last session and plans to isolate after today. Denies aches/pains today. He is not lightheaded currently but has had some AM spells. He has been checking his BP and communicating with his cardiologist.     Pertinent History  Pt was previously seeing pelvic health PT for urinary incontinence s/p laser TURP procedure October of 2019. With most of his urinary incontinence now resolved he was referred  onward due to ongoing LE weakness. Pt reports a history of spinal stenosis with bilateral hip and BLE pain since July of 2019. He also reports bilateral hip OA. He had an MRI which showed severe spinal stenosis. He was told that he is not a surgical candidate. Pt struggles with his balance and reports approximately 4 falls in the last 6 months. He typically catches his toes resulting in falling forward. He intermittently uses hiking poles for ambulation. He reports DOE with very minimal walking and underwent pulmonary rehab in the early part of 2019 without any improvement in his breathing. Pt  would like to improve his leg strength, balance, and endurance with walking.    Diagnostic tests  Lumbar MRI 03/02/2018: Severe L3-4 stenosis, Mod/severe L4-5 stenosis, Severe L5-S1 stenosis    Patient Stated Goals  Improve leg strength and endurance as well as improve balance    Currently in Pain?  No/denies          TREATMENT   Ther-ex  Hooklying bridges with 3s hold, 2 x 10; Hooklying clams with red tband around knees, 3s hold, 2 x 10; Hooklying adductor ball squeeze 3s hold, 2 x 10; Seated figure 4 piriformis stretch 30s hold x 2 bilateral; Seated marches with green tband around knees 2 x 15 bilateral; Lunges in // bars with BUE support x 10 bilateral; Mini squats in // bars no UE support 2 x 10; Reviewed exercises from pelvic health therapist not already performed, pt reports no benefit from these exercises so discontinued and added squats and standing rows to HEP; Performed outcome measures with pt including: 5TSTS: 30s but limited by right knee pain ABC: 69.375% (unbilled); 30s Sit to Stand Test: 5   Pt educated throughout session about proper posture and technique with exercises. Improved exercise technique, movement at target joints, use of target muscles after min to mod verbal, visual, tactile cues.    Pt reports that he would like to make today his last appointment and continue independently due to ongoing concerns regarding exposure to COVID-19. Reviewed, performed, and modified HEP with patient to ensure compliance and continued exercise outside the context of therapy. Updated outcome measures with patient which currently do not show any improvement however he has only had a few appointments since starting with therapist. Pt will be discharged on this date due to his preference.                        PT Short Term Goals - 10/11/18 0926      PT SHORT TERM GOAL #1   Title  Pt will be independent with HEP in order to improve strength and  balance in order to decrease fall risk and improve function at home and work.     Time  6    Period  Weeks    Status  Achieved    Target Date  10/05/18        PT Long Term Goals - 10/12/18 1045      PT LONG TERM GOAL #1   Title  Pt will improve BERG by at least 3 points in order to demonstrate clinically significant improvement in balance.     Baseline  08/24/18: 52/56    Time  12    Period  Weeks    Status  Deferred      PT LONG TERM GOAL #2   Title  Pt will improve ABC by at least 13% in order to demonstrate clinically  significant improvement in balance confidence.    Baseline  08/24/18: 61.875%; 10/11/18: 69.375%    Time  12    Period  Weeks    Status  Partially Met    Target Date  11/16/18      PT LONG TERM GOAL #3   Title  Pt will decrease 5TSTS by at least 3 seconds in order to demonstrate clinically significant improvement in LE strength.    Baseline  08/24/18: 20.6 seconds; 10/11/18: 30s but limited by right knee pain    Time  12    Period  Weeks    Status  New    Target Date  11/16/18      PT LONG TERM GOAL #4   Title  Pt will increase 30 seconds sit to stand test to greater than 11 complete sit to stand to sit to demonstrate improvement in LE endurance and decrease risk for falls    Baseline  08/24/18: 7.5 times; 10/12/18: 5 times    Time  12    Period  Weeks    Status  On-going    Target Date  11/16/18            Plan - 10/11/18 0854    Clinical Impression Statement  Pt reports that he would like to make today his last appointment and continue independently due to ongoing concerns regarding exposure to COVID-19. Reviewed, performed, and modified HEP with patient to ensure compliance and continued exercise outside the context of therapy. Updated outcome measures with patient which currently do not show any improvement however he has only had a few appointments since starting with therapist. Pt will be discharged on this date due to his preference.     Rehab  Potential  Good    PT Frequency  1x / week    PT Duration  12 weeks    PT Treatment/Interventions  Moist Heat;Manual techniques;Patient/family education;Therapeutic activities;Therapeutic exercise;Stair training;Neuromuscular re-education;Balance training;Scar mobilization;Energy conservation;Electrical Stimulation;Cryotherapy;Biofeedback;Functional mobility training;Taping;ADLs/Self Care Home Management;Aquatic Therapy;Canalith Repostioning;Iontophoresis '4mg'$ /ml Dexamethasone;Traction;Ultrasound;DME Instruction;Gait training;Passive range of motion;Dry needling;Vestibular;Spinal Manipulations;Joint Manipulations    PT Next Visit Plan  Discharge    PT Home Exercise Plan  Sit to stand without UE support, sidelying clams, supine SLR, additional exercises provided by pelvic PT, ([id number])    Consulted and Agree with Plan of Care  Patient       Patient will benefit from skilled therapeutic intervention in order to improve the following deficits and impairments:  Abnormal gait, Improper body mechanics, Postural dysfunction, Decreased endurance, Decreased activity tolerance, Decreased strength, Decreased balance, Difficulty walking, Decreased mobility, Cardiopulmonary status limiting activity, Pain  Visit Diagnosis: Muscle weakness (generalized)  Other abnormalities of gait and mobility     Problem List Patient Active Problem List   Diagnosis Date Noted  . Sepsis ([place]) 03/17/2018   Phillips Grout PT, DPT, GCS  Caleb Khan 10/12/2018, 10:54 AM  [place] MAIN [facility] SERVICES [address] [place], [place], [address] Phone: [phone]   Fax:  [phone]  Name: Caleb Khan MRN: [id number] Date of Birth: 12-Sep-1927

## 2018-10-11 NOTE — Patient Instructions (Signed)
Access Code: [email or web address]  URL: [email or web address]  Date: 10/11/2018  Prepared by: Ria Comment   Exercises  Mini Squat with Counter Support - 10 reps - 2 sets - 1x daily - 7x weekly  Standing Shoulder Row with Anchored Resistance - 10 reps - 2 sets - 3 seconds hold - 1x daily - 7x weekly

## 2018-10-13 ENCOUNTER — Ambulatory Visit: Payer: Medicare PPO

## 2018-10-18 ENCOUNTER — Ambulatory Visit: Payer: Medicare PPO

## 2018-10-25 ENCOUNTER — Ambulatory Visit: Payer: Medicare PPO

## 2018-10-31 ENCOUNTER — Ambulatory Visit: Payer: Medicare PPO

## 2018-12-21 IMAGING — MR MR LUMBAR SPINE W/O CM
5 series · 34 of 48 positions shown · non-contrast
Comparison: None

CLINICAL DATA: Low back pain radiating to both hips and legs.
Symptoms over the last 6 months.

EXAM:
MRI LUMBAR SPINE WITHOUT CONTRAST
TECHNIQUE: Multiplanar, multisequence MR imaging of the lumbar spine was
performed. No intravenous contrast was administered.

[Series 2: T2 · sagittal · 4.0mm · 0.81mm/px · 6 of 17 slices shown (1 of 2)]
[im 1/17]
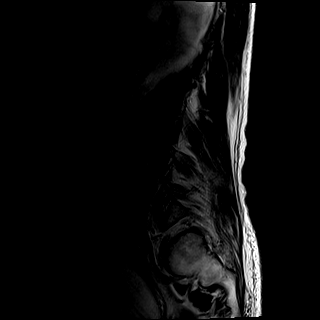
[im 4/17]
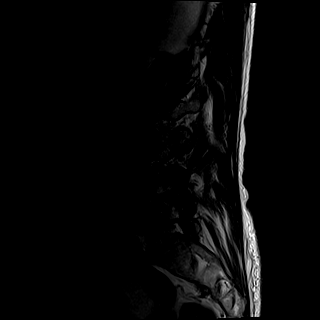
[im 7/17]
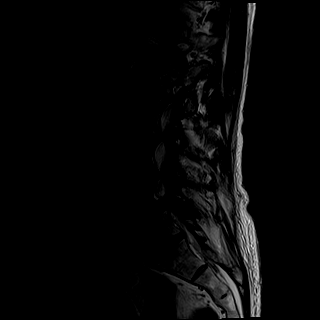
[im 10/17]
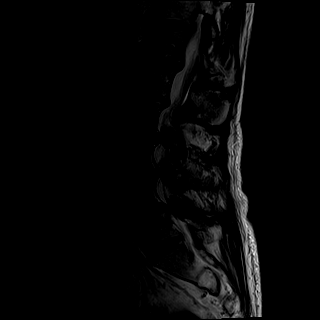
[im 13/17]
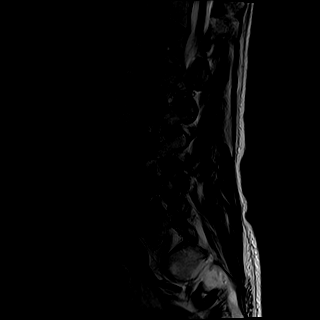
[im 17/17]
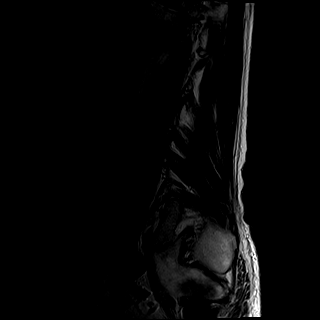

[Series 3: T1 · sagittal · 4.0mm · 0.81mm/px · 7 of 17 slices shown (1 of 2)]
[im 1/17]
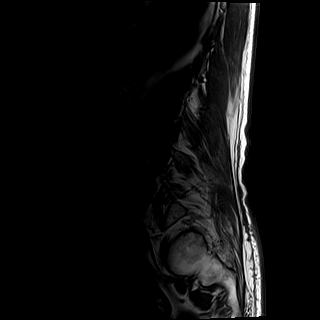
[im 3/17]
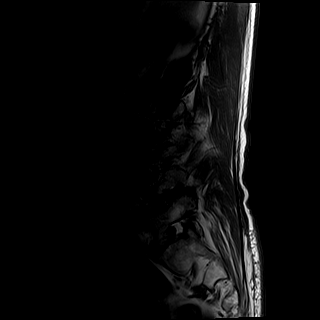
[im 6/17]
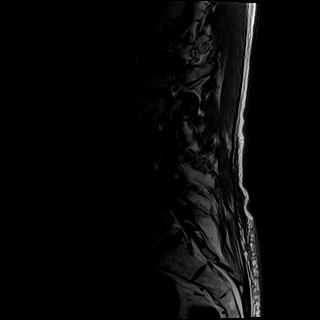
[im 9/17]
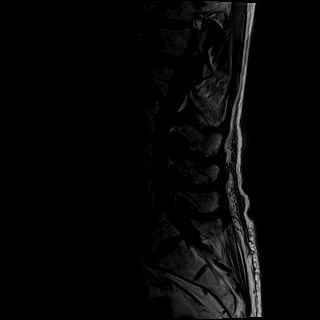
[im 11/17]
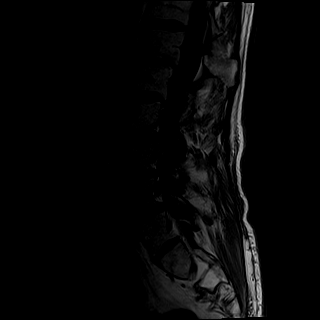
[im 14/17]
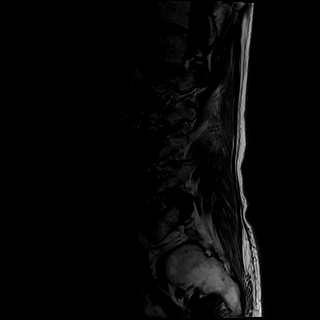
[im 17/17]
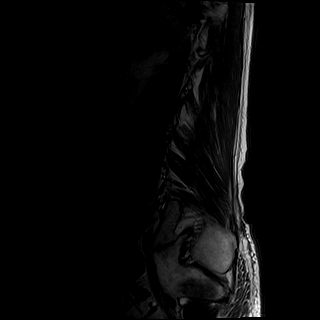

[Series 4: STIR · sagittal · 4.0mm · 0.81mm/px · 5 of 17 slices shown]
[im 1/17]
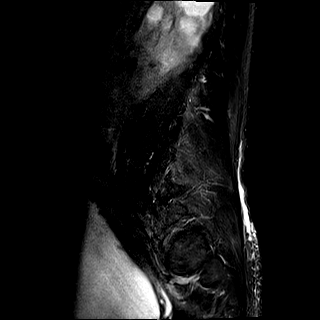
[im 3/17]
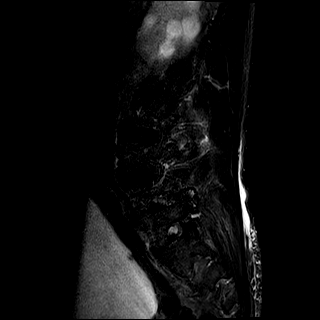
[im 6/17]
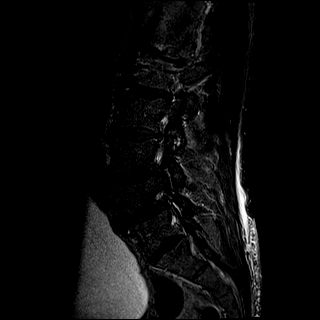
[im 9/17]
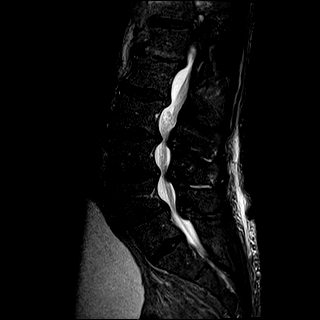
[im 11/17]
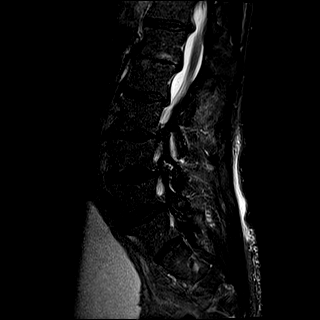

[Series 5: T2 · axial · 4.0mm · 0.78mm/px · z∈[-91,+109]mm · 8 of 37 slices shown (2 of 2)]
[im 1/37]
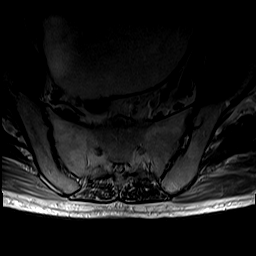
[im 6/37]
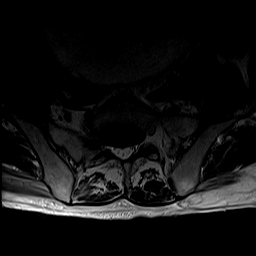
[im 12/37]
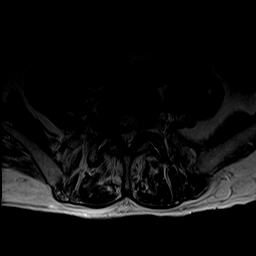
[im 17/37]
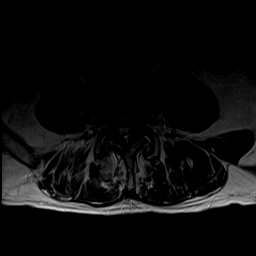
[im 20/37]
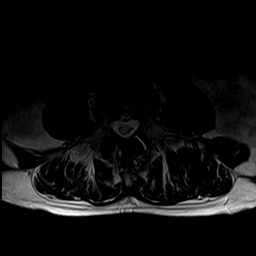
[im 25/37]
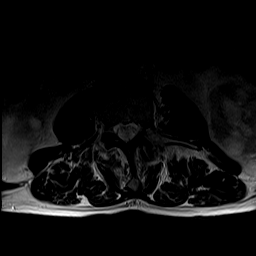
[im 31/37]
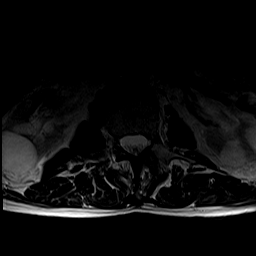
[im 37/37]
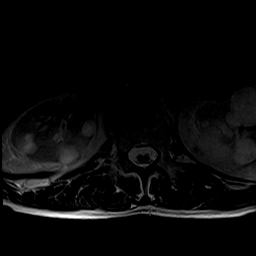

[Series 6: T1 · axial · 4.0mm · 0.39mm/px · z∈[-91,+109]mm · 8 of 37 slices shown (2 of 2)]
[im 1/37]
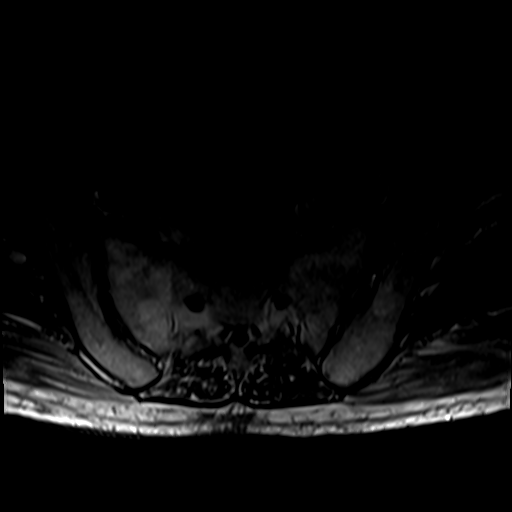
[im 6/37]
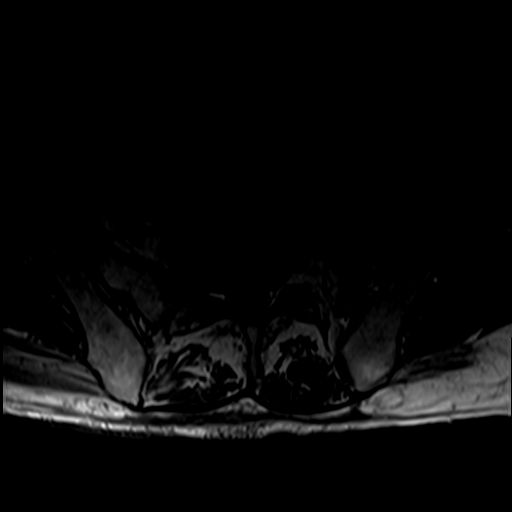
[im 12/37]
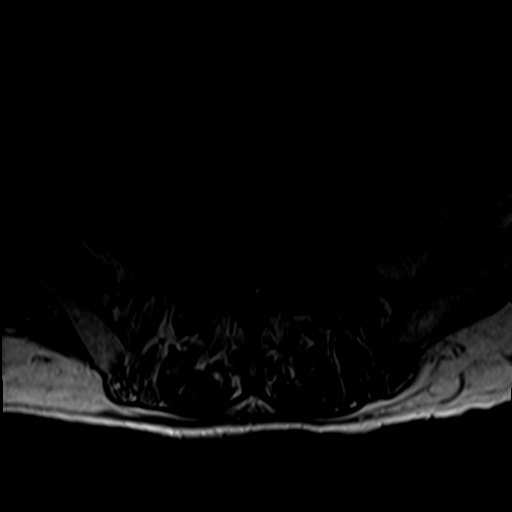
[im 17/37]
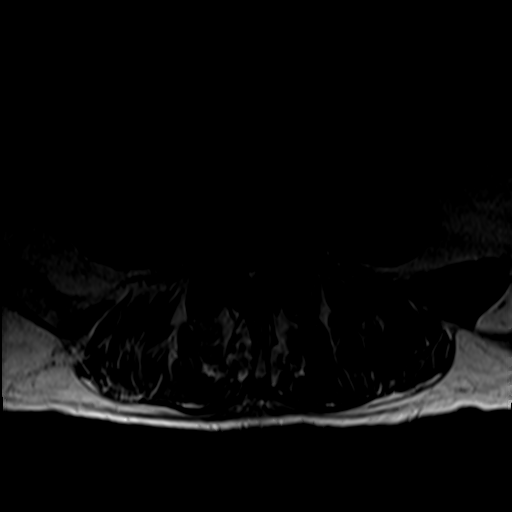
[im 20/37]
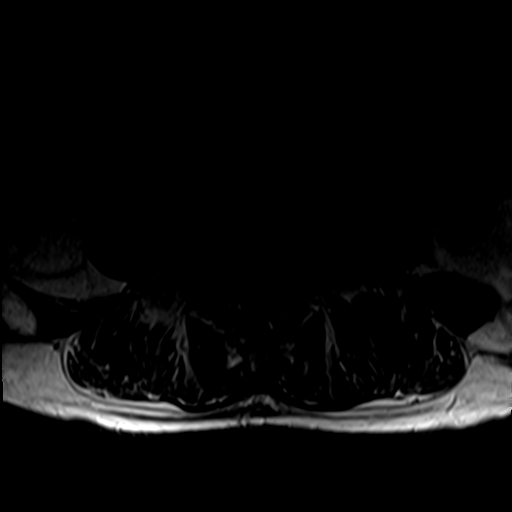
[im 25/37]
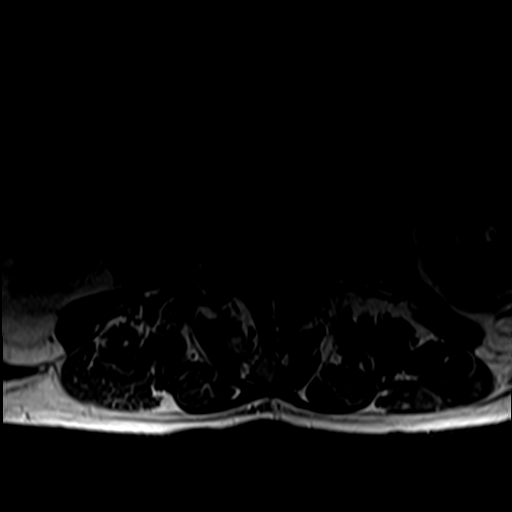
[im 31/37]
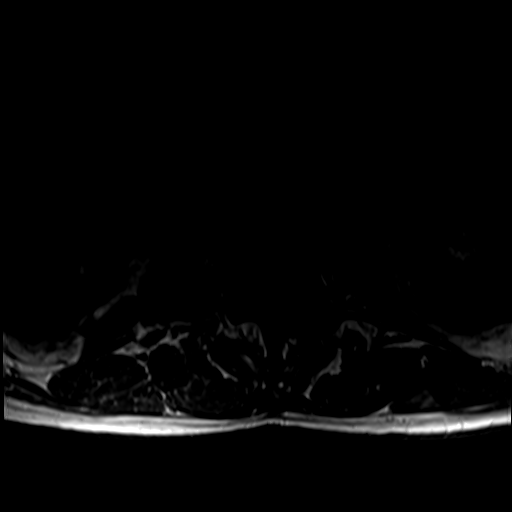
[im 37/37]
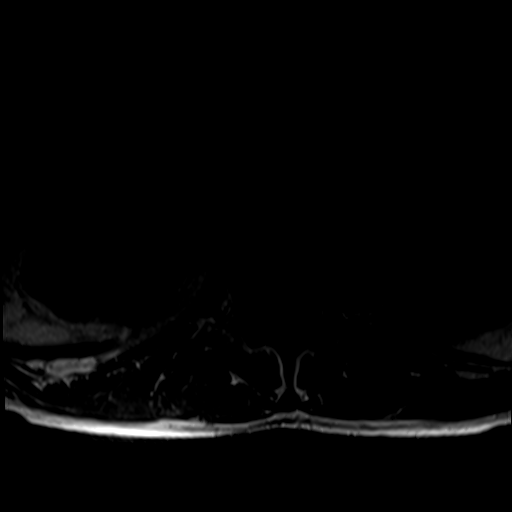

[34 of 48 positions shown; findings below may reference images not displayed]

FINDINGS: Segmentation:  S1 is described as a transitional vertebra.

Alignment: Curvature convex to the right with the apex at L3-4. 3 mm
retrolisthesis L1-2 and L2-3. 4 mm anterolisthesis L5-S1.

Vertebrae: No fracture or primary lesion. Chronic discogenic
endplate changes at L1-2.

Conus medullaris and cauda equina: Conus extends to the L2 level.
Conus and cauda equina appear normal.

Paraspinal and other soft tissues: Multiple bilateral renal cysts.
Distended bladder.

Disc levels:

T12-L1: Central disc herniation effaces the ventral subarachnoid
space but does not compress the cord or show foraminal extension.
This level was not studied in the axial plane.

L1-2: Chronic disc degeneration with loss of disc height. 3 mm
retrolisthesis. Endplate osteophytes and bulging of the disc. Mild
narrowing of the lateral recesses without compressive stenosis.

L2-3: 3 mm retrolisthesis. Bulging of the disc. Mild narrowing of
the lateral recesses left more than right but no apparent neural
compression.

L3-4: Endplate osteophytes and circumferential bulging of the disc.
Facet and ligamentous hypertrophy. Severe spinal stenosis at this
level that could cause neural compression on either or both sides.

L4-5: Endplate osteophytes and circumferential bulging of the disc.
Facet and ligamentous hypertrophy. Moderate to severe multifactorial
stenosis at this level that could cause neural compression on either
or both sides.

L5-S1: Bilateral facet arthropathy with facet and ligamentous
hypertrophy allowing anterolisthesis of 4 mm. Bulging of the disc.
Severe spinal stenosis and neural foraminal stenosis that could
cause neural compression on either or both sides.

S1-2: Rudimentary and unremarkable.
IMPRESSION: S1 is described as a transitional vertebra.

Severe multifactorial spinal stenosis at L3-4, moderate to severe
multifactorial spinal stenosis at L4-5, and severe spinal stenosis
at L5-S1 which could result in the presenting symptoms.

Mild lateral recess stenosis at L2-3 without distinct neural
compression.

Chronic disc degeneration at L1-2 without stenosis.

Small central disc herniation at T12-L1 without apparent neural
compression.

The bladder appears distended, suggesting that there could either be
bladder outlet obstruction or neurogenic bladder.

## 2019-08-22 ENCOUNTER — Ambulatory Visit: Payer: Medicare PPO | Attending: Internal Medicine

## 2019-08-22 ENCOUNTER — Other Ambulatory Visit: Payer: Self-pay

## 2019-08-22 DIAGNOSIS — M6281 Muscle weakness (generalized): Secondary | ICD-10-CM | POA: Insufficient documentation

## 2019-08-22 DIAGNOSIS — R2689 Other abnormalities of gait and mobility: Secondary | ICD-10-CM | POA: Insufficient documentation

## 2019-08-22 NOTE — Therapy (Signed)
[facility] [facility] MAIN [facility] SERVICES [address] [place], [place], [address] Phone: [phone]   Fax:  [phone]  Physical Therapy Evaluation  Patient Details  Name: Caleb Khan MRN: [id number] Date of Birth: March 25, 1928 Referring Provider (PT): Dr. Tresa Moore   Encounter Date: 08/22/2019  PT End of Session - 08/22/19 1134    Visit Number  1    Number of Visits  25    Date for PT Re-Evaluation  11/14/19    Authorization Type  Eval 08/22/19    PT Start Time  1105    PT Stop Time  1200    PT Time Calculation (min)  55 min    Equipment Utilized During Treatment  Gait belt    Activity Tolerance  Patient tolerated treatment well    Behavior During Therapy  [facility] for tasks assessed/performed       Past Medical History:  Diagnosis Date  . Anemia   . Anginal pain (HCC)   . Arthritis    osteoarthritis  . Chronic kidney disease    stage 3  . COPD (chronic obstructive pulmonary disease) (HCC)    NO INHALERS  . Coronary artery disease   . Dyspnea    with exertion  . Hypertension   . Polymyalgia rheumatica (HCC)    TAKES PREDNISONE  . Spinal stenosis     Past Surgical History:  Procedure Laterality Date  . CARDIAC CATHETERIZATION Left 05/22/2016   Procedure: Left Heart Cath and Coronary Angiography;  Surgeon: Alwyn Pea, MD;  Location: [facility] INVASIVE CV LAB;  Service: Cardiovascular;  Laterality: Left;  . EYE SURGERY Bilateral    Cataract Extraction with IOL  . GREEN LIGHT LASER TURP (TRANSURETHRAL RESECTION OF PROSTATE N/A 04/26/2018   Procedure: GREEN LIGHT LASER TURP (TRANSURETHRAL RESECTION OF PROSTATE;  Surgeon: Orson Ape, MD;  Location: [facility] ORS;  Service: Urology;  Laterality: N/A;  . INTRAVASCULAR PRESSURE WIRE/FFR STUDY N/A 09/15/2018   Procedure: INTRAVASCULAR PRESSURE WIRE/FFR STUDY;  Surgeon: Alwyn Pea, MD;  Location: [facility] INVASIVE CV LAB;  Service: Cardiovascular;  Laterality: N/A;  . JOINT REPLACEMENT  Right 2004   Shoulder Replacement  . JOINT REPLACEMENT Left 2007   Ankle Replacement  . LEFT HEART CATH AND CORONARY ANGIOGRAPHY Left 09/15/2018   Procedure: LEFT HEART CATH AND CORONARY ANGIOGRAPHY;  Surgeon: Alwyn Pea, MD;  Location: [facility] INVASIVE CV LAB;  Service: Cardiovascular;  Laterality: Left;  . PATELLAR TENDON REPAIR Left 07/15/2017   Procedure: PATELLA TENDON REPAIR;  Surgeon: Kennedy Bucker, MD;  Location: [facility] ORS;  Service: Orthopedics;  Laterality: Left;  Marland Kitchen QUADRICEPS TENDON REPAIR Left 07/15/2017   Procedure: REPAIR QUADRICEP TENDON;  Surgeon: Kennedy Bucker, MD;  Location: [facility] ORS;  Service: Orthopedics;  Laterality: Left;  . TONSILLECTOMY    . VASECTOMY      There were no vitals filed for this visit.     Subjective Assessment - 08/22/19 1119    Subjective  LE weakness    Pertinent History  Pt was previously seeing pelvic health PT for urinary incontinence s/p laser TURP procedure October of 2019. With most of his urinary incontinence now resolved he was referred onward due to ongoing LE weakness. Pt reports a history of spinal stenosis with bilateral hip and BLE pain since July of 2019. He also reports bilateral hip OA. He had an MRI which showed severe spinal stenosis. He was told that he is not a surgical candidate. Pt struggles with his balance and reports approximately  4 falls in the last 6 months. He typically catches his toes resulting in falling forward. He intermittently uses hiking poles for ambulation. He reports DOE with very minimal walking and underwent pulmonary rehab in the early part of 2019 without any improvement in his breathing. Pt would like to improve his leg strength, balance, and endurance with walking. Interval History: 08/22/19: Pt reports that he is not having further LE pain but has been left with BLE weakness. He reports "a couple falls in the last 6 months." He had a NCV test 6-8 months which showed poor nerve conductivity bilateral LE. He was  referred back for therapy for additional LE strengthening.    Limitations  Walking    How long can you walk comfortably?  3/8 mile    Diagnostic tests  Lumbar MRI 03/02/2018: Severe L3-4 stenosis, Mod/severe L4-5 stenosis, Severe L5-S1 stenosis, NCV: BLE nerve conductivity deficits    Patient Stated Goals  Improve leg strength and "decreasing spinal stenosis symptoms"    Currently in Pain?  No/denies   L shoulder pain with movement, R knee pain with activity but no pain reported at rest        SUBJECTIVE Chief complaint: Pt was previously seeing pelvic health PT for urinary incontinence s/p laser TURP procedure October of 2019. With most of his urinary incontinence now resolved he was referred onward due to ongoing LE weakness. Pt reports a history of spinal stenosis with bilateral hip and BLE pain since July of 2019. He also reports bilateral hip OA. He had an MRI which showed severe spinal stenosis. He was told that he is not a surgical candidate. Pt struggles with his balance and reports approximately 4 falls in the last 6 months. He typically catches his toes resulting in falling forward. He intermittently uses hiking poles for ambulation. He reports DOE with very minimal walking and underwent pulmonary rehab in the early part of 2019 without any improvement in his breathing. Pt would like to improve his leg strength, balance, and endurance with walking. Interval History: 08/22/19: Pt reports that he is not having further LE pain but has been left with BLE weakness. He reports "a couple falls in the last 6 months." He had a NCV test 6-8 months which showed poor nerve conductivity bilateral LE. He was referred back for therapy for additional LE strengthening.   Imaging: Lumbar MRI 03/02/2018: Severe L3-4 stenosis, Mod/severe L4-5 stenosis, Severe L5-S1 stenosis Recent changes in overall health/medication: Previously stopped Prednisone but started back taking 5mg  daily 1 month ago. Started isosorbide  to help with angina.  Directional pattern for falls: Forward Red flags (bowel/bladder changes, saddle paresthesia, personal history of cancer, chills/fever, night sweats, unrelenting pain) Negative   OBJECTIVE  MUSCULOSKELETAL: Tremor: Absent Bulk: Normal Tone: Normal  Posture Forward head and rounded shoulders  Gait Pt with notable foot slap bilaterally during gait with decreased to to floor clearance.    Strength R/L 4+/4+ Hip flexion 4+/4+ Hip external rotation 4+/4+ Hip internal rotation NT/NT Hip extension (deferred due to discomfort laying on stomach)  4-/4- Hip abduction 4+/4+ Hip adduction 5/5 Knee extension 4+/4+ Knee flexion (in sitting) 3+/3+ Ankle Dorsiflexion  Significant decreased HS length noted bilaterally; Significant bilateral hip internal rotation noted and very mild loss of bilateral hip flexion PROM;  NEUROLOGICAL:  Mental Status Patient is oriented to person, place and time.  Recent memory is intact.  Remote memory is intact.  Attention span and concentration are intact.  Expressive speech is intact.  Patient's fund of knowledge is within normal limits for educational level.   Sensation Grossly intact to light touch bilateral LEs as determined by testing dermatomes L2-S2 respectively Proprioception and hot/cold testing deferred on this date  Reflexes Testing deferred  Coordination/Cerebellar Testing deferred   FUNCTIONAL OUTCOME MEASURES     Results Comments  BERG 47/56 Fall risk, in need of intervention  30s Sit to Stand Test 2 reps Considerable fatigue and weakness noted, fall risk  5TSTS Able to perform 2 reps without hands Above normative values, fall risk  10 Meter Gait Speed Self-selected: 11.0s =  0.91 m/s; Fastest: 8.8s = 1.14 m/s Within normative values for full community ambulation  ABC Scale 60.6% (61.875%) Below cut-off, increased fall risk            [facility] PT Assessment - 08/22/19 1123       Assessment   Medical Diagnosis  Weakness    Referring Provider (PT)  Dr. Tresa Moore    Onset Date/Surgical Date  01/24/18   Approximate   Hand Dominance  Right    Next MD Visit  March/April 2021    Prior Therapy  Previously saw pelvic health PT for urinary incontinence s/p TURP. Pulmonary rehab during first part of last year. PT for leg weakness early part of 2020 but discontinued due to COVID 19 pandemic       Precautions   Precautions  Fall      Restrictions   Weight Bearing Restrictions  No      Balance Screen   Has the patient fallen in the past 6 months  Yes    How many times?  2    Has the patient had a decrease in activity level because of a fear of falling?   Yes    Is the patient reluctant to leave their home because of a fear of falling?   No      Home Environment   Living Environment  Other (Comment)   Independent living at [facility] - single point;Walker - 4 wheels   Hiking     Prior Function   Level of Independence  Independent;Independent with household mobility with device   Uses single point cane   Leisure  Walk, arrange golf outing but doesn't play, plays pool and bridge      Cognition   Overall Cognitive Status  Within Functional Limits for tasks assessed      Observation/Other Assessments   Activities of Balance Confidence Scale (ABC Scale)   --      Standardized Balance Assessment   Standardized Balance Assessment  Berg Balance Test    Five times sit to stand comments   --    10 Meter Walk  --      Berg Balance Test   Sit to Stand  Able to stand without using hands and stabilize independently    Standing Unsupported  Able to stand safely 2 minutes    Sitting with Back Unsupported but Feet Supported on Floor or Stool  Able to sit safely and securely 2 minutes    Stand to Sit  Controls descent by using hands    Transfers  Able to transfer safely, minor use of hands    Standing Unsupported with Eyes Closed  Able to stand 10  seconds safely    Standing Unsupported with Feet Together  Able to place feet together independently and stand for 1 minute with supervision    From Standing, Reach  Forward with Outstretched Arm  Can reach confidently >25 cm (10")    From Standing Position, Pick up Object from Floor  Able to pick up shoe, needs supervision    From Standing Position, Turn to Look Behind Over each Shoulder  Looks behind from both sides and weight shifts well    Turn 360 Degrees  Able to turn 360 degrees safely but slowly    Standing Unsupported, Alternately Place Feet on Step/Stool  Able to stand independently and safely and complete 8 steps in 20 seconds    Standing Unsupported, One Foot in Front  Able to plae foot ahead of the other independently and hold 30 seconds    Standing on One Leg  Tries to lift leg/unable to hold 3 seconds but remains standing independently    Total Score  47      High Level Balance   High Level Balance Comments  --         Pt provided written HEP including hip/low back stretching as well as leg strengthening/balance       Objective measurements completed on examination: See above findings.              PT Education - 08/22/19 1134    Education Details  Plan of care, HEP    Person(s) Educated  Patient    Methods  Explanation    Comprehension  Verbalized understanding       PT Short Term Goals - 08/22/19 1136      PT SHORT TERM GOAL #1   Title  Pt will be independent with HEP in order to improve strength and balance in order to decrease fall risk and improve function at home and decrease risk for falls    Time  6    Period  Weeks    Status  New    Target Date  10/04/19        PT Long Term Goals - 08/22/19 1136      PT LONG TERM GOAL #1   Title  Pt will improve BERG by at least 3 points in order to demonstrate clinically significant improvement in balance.     Baseline  08/22/19: 47/56    Time  12    Period  Weeks    Status  New    Target Date   11/14/19      PT LONG TERM GOAL #2   Title  Pt will improve ABC by at least 13% in order to demonstrate clinically significant improvement in balance confidence.    Baseline  08/22/19: 60.6%    Time  12    Period  Weeks    Status  New    Target Date  11/14/19      PT LONG TERM GOAL #3   Title  Pt will be able to perform at least 5 sit to stands from regular height chair without UE assistance    Baseline  08/23/19: Able to perform 2 but limited by weakness and R knee pain    Time  12    Period  Weeks    Status  New    Target Date  11/14/19      PT LONG TERM GOAL #4   Title  Pt will increase self selected gait speed by at least 0.13 m/s in order to demonstrate clinically significant improvement in community ambulation.    Baseline  08/22/19: 11.0s =  0.91 m/s;    Time  12    Period  Weeks    Status  New    Target Date  11/14/19             Plan - 08/22/19 1134    Clinical Impression Statement  Pt is a pleasant 86 year-old male referred for difficulty with LE weakness. He is known to this clinic from prior therapy for the same issue. Pt has a history of severe spinal stenosis with BLE neuropathy. PT examination reveals LE strength and endurance deficits particularly at the hips and ankles. All of his outcome measures are slightly worse today compared to when he started physical therapy the last time 08/24/18. His welf selected gait speed is 0.91 m/s which is below the 1.0 m/s cut-off for community ambulators. He is only able to perform 2 sit to stands today without his hands and is limited by R knee pain and BLE weakness. His BERG has dropped to 47/56 and his balance confidence is also low at 60.6%. Pt presents with deficits in strength, gait and balance. He will benefit from skilled PT services to address deficits in strength and balance and decrease risk for future falls.    Personal Factors and Comorbidities  Comorbidity 3+    Comorbidities  spinal stenosis, COPD, CKD,  polymyalgia rheumatica    Examination-Activity Limitations  Bend;Locomotion Level;Transfers;Squat;Stairs    Examination-Participation Restrictions  Community Activity;Yard Work;Cleaning    Stability/Clinical Decision Making  Unstable/Unpredictable    Clinical Decision Making  Moderate    Rehab Potential  Fair    PT Frequency  2x / week    PT Duration  12 weeks    PT Treatment/Interventions  Moist Heat;Manual techniques;Patient/family education;Therapeutic activities;Therapeutic exercise;Stair training;Neuromuscular re-education;Balance training;Scar mobilization;Energy conservation;Electrical Stimulation;Cryotherapy;Biofeedback;Functional mobility training;Taping;ADLs/Self Care Home Management;Aquatic Therapy;Canalith Repostioning;Iontophoresis 4mg /ml Dexamethasone;Traction;Ultrasound;DME Instruction;Gait training;Passive range of motion;Dry needling;Vestibular;Spinal Manipulations;Joint Manipulations    PT Next Visit Plan  Progress LE strengthening and balance training, address low back pain related to spinal stenosis as able    PT Home Exercise Plan  Medbridge Access Code: [email or web address]    Consulted and Agree with Plan of Care  Patient       Patient will benefit from skilled therapeutic intervention in order to improve the following deficits and impairments:  Abnormal gait, Decreased endurance, Decreased activity tolerance, Decreased strength, Decreased balance, Difficulty walking, Decreased mobility, Cardiopulmonary status limiting activity, Pain  Visit Diagnosis: Muscle weakness (generalized) - Plan: PT plan of care cert/re-cert  Other abnormalities of gait and mobility - Plan: PT plan of care cert/re-cert     Problem List Patient Active Problem List   Diagnosis Date Noted  . Sepsis (HCC) 03/17/2018   Lynnea MaizesJason D Shaquill Iseman PT, DPT, GCS  Jeremaih Klima 08/23/2019, 1:03 PM  [facility] [address] [place], [place], [address] Phone:  [phone]   Fax:  [phone]  Name: Caleb MoodLeon Khan MRN: [id number] Date of Birth: 04-28-1928

## 2019-08-22 NOTE — Patient Instructions (Signed)
Access Code: [email or web address] URL: [email or web address] Date: 08/22/2019 Prepared by: Ria Comment  Exercises Supine Bridge - 10 reps - 2 sets - 3 seconds hold - 1x daily - 7x weekly Seated March with Resistance - 10 reps - 2 sets - 3 seconds hold - 1x daily - 7x weekly Seated Hip Abduction - 10 reps - 2 sets - 3 seconds hold - 1x daily - 7x weekly Lunge with Counter Support - 10 reps - 2 sets - (10 repetitions on each side) hold - 1x daily - 7x weekly Seated Figure 4 Piriformis Stretch - 3 reps - 30-45 seconds on each side hold - 2x daily - 7x weekly Hooklying Single Knee to Chest Stretch - 3 reps - 30-45 seconds on each side hold - 2x daily - 7x weekly

## 2019-08-31 ENCOUNTER — Other Ambulatory Visit: Payer: Self-pay

## 2019-08-31 ENCOUNTER — Ambulatory Visit: Payer: Medicare PPO | Attending: Internal Medicine

## 2019-08-31 DIAGNOSIS — M25552 Pain in left hip: Secondary | ICD-10-CM | POA: Insufficient documentation

## 2019-08-31 DIAGNOSIS — M545 Low back pain: Secondary | ICD-10-CM | POA: Diagnosis present

## 2019-08-31 DIAGNOSIS — R278 Other lack of coordination: Secondary | ICD-10-CM | POA: Insufficient documentation

## 2019-08-31 DIAGNOSIS — M6281 Muscle weakness (generalized): Secondary | ICD-10-CM

## 2019-08-31 DIAGNOSIS — Z9181 History of falling: Secondary | ICD-10-CM | POA: Diagnosis present

## 2019-08-31 DIAGNOSIS — M4125 Other idiopathic scoliosis, thoracolumbar region: Secondary | ICD-10-CM | POA: Diagnosis present

## 2019-08-31 DIAGNOSIS — R2689 Other abnormalities of gait and mobility: Secondary | ICD-10-CM | POA: Diagnosis present

## 2019-08-31 DIAGNOSIS — G8929 Other chronic pain: Secondary | ICD-10-CM | POA: Diagnosis present

## 2019-08-31 DIAGNOSIS — M25551 Pain in right hip: Secondary | ICD-10-CM | POA: Diagnosis present

## 2019-08-31 NOTE — Therapy (Signed)
[facility] MAIN [facility] SERVICES [address] [place], [place], [address] Phone: [phone]   Fax:  [phone]  Physical Therapy Treatment  Patient Details  Name: Caleb Khan MRN: [id number] Date of Birth: November 15, 1927 Referring Provider (PT): Dr. Tresa Moore   Encounter Date: 08/31/2019  PT End of Session - 08/31/19 0901    Visit Number  2    Number of Visits  25    Date for PT Re-Evaluation  11/14/19    Authorization Type  Eval 08/22/19    PT Start Time  0847    PT Stop Time  0930    PT Time Calculation (min)  43 min    Equipment Utilized During Treatment  Gait belt    Activity Tolerance  Patient tolerated treatment well    Behavior During Therapy  Caleb Khan       Past Medical History:  Diagnosis Date  . Anemia   . Anginal pain (HCC)   . Arthritis    osteoarthritis  . Chronic kidney disease    stage 3  . COPD (chronic obstructive pulmonary disease) (HCC)    NO INHALERS  . Coronary artery disease   . Dyspnea    with exertion  . Hypertension   . Polymyalgia rheumatica (HCC)    TAKES PREDNISONE  . Spinal stenosis     Past Surgical History:  Procedure Laterality Date  . CARDIAC CATHETERIZATION Left 05/22/2016   Procedure: Left Heart Cath and Coronary Angiography;  Surgeon: Alwyn Pea, MD;  Location: [facility] INVASIVE CV LAB;  Service: Cardiovascular;  Laterality: Left;  . EYE SURGERY Bilateral    Cataract Extraction with IOL  . GREEN LIGHT LASER TURP (TRANSURETHRAL RESECTION OF PROSTATE N/A 04/26/2018   Procedure: GREEN LIGHT LASER TURP (TRANSURETHRAL RESECTION OF PROSTATE;  Surgeon: Orson Ape, MD;  Location: [facility] ORS;  Service: Urology;  Laterality: N/A;  . INTRAVASCULAR PRESSURE WIRE/FFR STUDY N/A 09/15/2018   Procedure: INTRAVASCULAR PRESSURE WIRE/FFR STUDY;  Surgeon: Alwyn Pea, MD;  Location: [facility] INVASIVE CV LAB;  Service: Cardiovascular;  Laterality: N/A;  . JOINT REPLACEMENT  Right 2004   Shoulder Replacement  . JOINT REPLACEMENT Left 2007   Ankle Replacement  . LEFT HEART CATH AND CORONARY ANGIOGRAPHY Left 09/15/2018   Procedure: LEFT HEART CATH AND CORONARY ANGIOGRAPHY;  Surgeon: Alwyn Pea, MD;  Location: [facility] INVASIVE CV LAB;  Service: Cardiovascular;  Laterality: Left;  . PATELLAR TENDON REPAIR Left 07/15/2017   Procedure: PATELLA TENDON REPAIR;  Surgeon: Kennedy Bucker, MD;  Location: [facility] ORS;  Service: Orthopedics;  Laterality: Left;  Marland Kitchen QUADRICEPS TENDON REPAIR Left 07/15/2017   Procedure: REPAIR QUADRICEP TENDON;  Surgeon: Kennedy Bucker, MD;  Location: [facility] ORS;  Service: Orthopedics;  Laterality: Left;  . TONSILLECTOMY    . VASECTOMY      There were no vitals filed for this visit.  Subjective Assessment - 08/31/19 0859    Subjective  Pt reports that he is doing alright today. He would ike to focus mostly on working on his spinal stenosis as he feels like if pressure can be relieved off his back his leg strength will return. He would like to review his HEP because she states that he is having some difficulty performing a few of the exercises.    Pertinent History  Pt was previously seeing pelvic health PT for urinary incontinence s/p laser TURP procedure October of 2019. With most of his urinary incontinence now resolved he was referred onward due  to ongoing LE weakness. Pt reports a history of spinal stenosis with bilateral hip and BLE pain since July of 2019. He also reports bilateral hip OA. He had an MRI which showed severe spinal stenosis. He was told that he is not a surgical candidate. Pt struggles with his balance and reports approximately 4 falls in the last 6 months. He typically catches his toes resulting in falling forward. He intermittently uses hiking poles for ambulation. He reports DOE with very minimal walking and underwent pulmonary rehab in the early part of 2019 without any improvement in his breathing. Pt would like to improve his leg  strength, balance, and endurance with walking. Interval History: 08/22/19: Pt reports that he is not having further LE pain but has been left with BLE weakness. He reports "a couple falls in the last 6 months." He had a NCV test 6-8 months which showed poor nerve conductivity bilateral LE. He was referred back for therapy for additional LE strengthening.    Limitations  Walking    How long can you walk comfortably?  3/8 mile    Diagnostic tests  Lumbar MRI 03/02/2018: Severe L3-4 stenosis, Mod/severe L4-5 stenosis, Severe L5-S1 stenosis, NCV: BLE nerve conductivity deficits    Patient Stated Goals  Improve leg strength and "decreasing spinal stenosis symptoms"    Currently in Pain?  No/denies          TREATMENT   Ther-ex  Extensive education with patient about central and lateral stenosis as well as the interventions used for both conditions. Discussed limitations of physical therapy to relieve pressure from spinal cord but interventions which may be helpful. Hooklying lumbar posterior pelvic tilt 5s hold x 10; Hookying bridges with cues to maintain posterior tilt during bridge 3-5s hold x 10; Squats with BUE support x 10, pt demonstrates good technique with proper hip hinge and limits excessive knee flexion, added to HEP;    Manual Therapy  Supine single knee to chest stretch 45s hold x 2 bilateral; Double knee to chest stretch by therapist 45s hold x 3; Hooklying lumbar rotation stretch 15-20s hold x 2 bilateral; Gentle manual lumbar traction with belt at proximal thighs however had to discontinue after 20s due to increase in discomfort below belt; Long axis hip distraction for lumbar mobilization grade II, 30s/bout x 2 bouts on each side; Hip inferior mobilizations for lumbar traction with hips flexed to 45 degrees, grade III, 20s/bout x 2 bouts on each side;   Pt educated throughout session about proper posture and technique with exercises. Improved exercise technique, movement at  target joints, use of target muscles after min to mod verbal, visual, tactile cues.     Extensive education with patient about central and lateral stenosis as well as the interventions used for both conditions. Discussed limitations of physical therapy to relieve pressure from spinal cord but interventions which may be helpful. Performed HEP with patient to ensure proper form and understanding today. Removed lunges from program and added squats at patient's request. Pt demonstrates good form/technique with squats. Pt will benefit from PT services to address deficits in strength, balance, and mobility in order to return to full function at home.                 PT Short Term Goals - 08/22/19 1136      PT SHORT TERM GOAL #1   Title  Pt will be independent with HEP in order to improve strength and balance in order to decrease fall risk  and improve function at home and decrease risk for falls    Time  6    Period  Weeks    Status  New    Target Date  10/04/19        PT Long Term Goals - 08/22/19 1136      PT LONG TERM GOAL #1   Title  Pt will improve BERG by at least 3 points in order to demonstrate clinically significant improvement in balance.     Baseline  08/22/19: 47/56    Time  12    Period  Weeks    Status  New    Target Date  11/14/19      PT LONG TERM GOAL #2   Title  Pt will improve ABC by at least 13% in order to demonstrate clinically significant improvement in balance confidence.    Baseline  08/22/19: 60.6%    Time  12    Period  Weeks    Status  New    Target Date  11/14/19      PT LONG TERM GOAL #3   Title  Pt will be able to perform at least 5 sit to stands from regular height chair without UE assistance    Baseline  08/23/19: Able to perform 2 but limited by weakness and R knee pain    Time  12    Period  Weeks    Status  New    Target Date  11/14/19      PT LONG TERM GOAL #4   Title  Pt will increase self selected gait speed by at least 0.13  m/s in order to demonstrate clinically significant improvement in community ambulation.    Baseline  08/22/19: 11.0s =  0.91 m/s;    Time  12    Period  Weeks    Status  New    Target Date  11/14/19            Plan - 08/31/19 0902    Clinical Impression Statement  Extensive education with patient about central and lateral stenosis as well as the interventions used for both conditions. Discussed limitations of physical therapy to relieve pressure from spinal cord but interventions which may be helpful. Performed HEP with patient to ensure proper form and understanding today. Removed lunges from program and added squats at patient's request. Pt demonstrates good form/technique with squats. Pt will benefit from PT services to address deficits in strength, balance, and mobility in order to return to full function at home.    Personal Factors and Comorbidities  Comorbidity 3+    Comorbidities  spinal stenosis, COPD, CKD, polymyalgia rheumatica    Examination-Activity Limitations  Bend;Locomotion Level;Transfers;Squat;Stairs    Examination-Participation Restrictions  Community Activity;Yard Work;Cleaning    Stability/Clinical Decision Making  Unstable/Unpredictable    Rehab Potential  Fair    PT Frequency  2x / week    PT Duration  12 weeks    PT Treatment/Interventions  Moist Heat;Manual techniques;Patient/family education;Therapeutic activities;Therapeutic exercise;Stair training;Neuromuscular re-education;Balance training;Scar mobilization;Energy conservation;Electrical Stimulation;Cryotherapy;Biofeedback;Functional mobility training;Taping;ADLs/Self Care Home Management;Aquatic Therapy;Canalith Repostioning;Iontophoresis 4mg /ml Dexamethasone;Traction;Ultrasound;DME Instruction;Gait training;Passive range of motion;Dry needling;Vestibular;Spinal Manipulations;Joint Manipulations    PT Next Visit Plan  Progress LE strengthening and balance training, address spinal stenosis as able    PT Home  Exercise Plan  Medbridge Access Code:    Consulted and Agree with Plan of Care  Patient       Patient will benefit from skilled therapeutic intervention in order to  improve the following deficits and impairments:  Abnormal gait, Decreased endurance, Decreased activity tolerance, Decreased strength, Decreased balance, Difficulty walking, Decreased mobility, Cardiopulmonary status limiting activity, Pain  Visit Diagnosis: Muscle weakness (generalized)  Other abnormalities of gait and mobility  Chronic bilateral low back pain, unspecified whether sciatica present     Problem List Patient Active Problem List   Diagnosis Date Noted  . Sepsis ([place]) 03/17/2018   Phillips Grout PT, DPT, GCS  Joreen Swearingin 08/31/2019, 2:27 PM  [place] MAIN [facility] SERVICES [address] [place], [place], [address] Phone: [phone]   Fax:  [phone]  Name: Antonyo Hinderer MRN: [id number] Date of Birth: 30-Jan-1928

## 2019-08-31 NOTE — Patient Instructions (Signed)
Access Code: [email or web address] URL: [email or web address] Date: 08/31/2019 Prepared by: Ria Comment  Exercises Supine Bridge - 10 reps - 2 sets - 3 seconds hold - 1x daily - 7x weekly Seated March with Resistance - 10 reps - 2 sets - 3 seconds hold - 1x daily - 7x weekly Mini Squat with Counter Support - 10 reps - 2 sets - 1x daily - 7x weekly Seated Hip Abduction - 10 reps - 2 sets - 3 seconds hold - 1x daily - 7x weekly Seated Figure 4 Piriformis Stretch - 3 reps - 30-45 seconds on each side hold - 2x daily - 7x weekly Hooklying Single Knee to Chest Stretch - 3 reps - 30-45 seconds on each side hold - 2x daily - 7x weekly

## 2019-09-05 ENCOUNTER — Ambulatory Visit: Payer: Medicare PPO

## 2019-09-05 ENCOUNTER — Other Ambulatory Visit: Payer: Self-pay

## 2019-09-05 DIAGNOSIS — M6281 Muscle weakness (generalized): Secondary | ICD-10-CM

## 2019-09-05 DIAGNOSIS — G8929 Other chronic pain: Secondary | ICD-10-CM

## 2019-09-05 DIAGNOSIS — R2689 Other abnormalities of gait and mobility: Secondary | ICD-10-CM

## 2019-09-05 NOTE — Therapy (Signed)
[place] [facility] MAIN [facility] SERVICES [address] [place], [place], [address] Phone: [phone]   Fax:  [phone]  Physical Therapy Treatment  Patient Details  Name: Caleb Khan MRN: [id number] Date of Birth: [birth year] Referring Provider (PT): Dr. Tresa Moore   Encounter Date: 09/05/2019  PT End of Session - 09/05/19 1240    Visit Number  3    Number of Visits  25    Date for PT Re-Evaluation  11/14/19    Authorization Type  Eval 08/22/19    PT Start Time  1110    PT Stop Time  1155    PT Time Calculation (min)  45 min    Equipment Utilized During Treatment  Gait belt    Activity Tolerance  Patient tolerated treatment well    Behavior During Therapy  [facility] for tasks assessed/performed       Past Medical History:  Diagnosis Date  . Anemia   . Anginal pain (HCC)   . Arthritis    osteoarthritis  . Chronic kidney disease    stage 3  . COPD (chronic obstructive pulmonary disease) (HCC)    NO INHALERS  . Coronary artery disease   . Dyspnea    with exertion  . Hypertension   . Polymyalgia rheumatica (HCC)    TAKES PREDNISONE  . Spinal stenosis     Past Surgical History:  Procedure Laterality Date  . CARDIAC CATHETERIZATION Left 05/22/2016   Procedure: Left Heart Cath and Coronary Angiography;  Surgeon: Alwyn Pea, MD;  Location: [facility] INVASIVE CV LAB;  Service: Cardiovascular;  Laterality: Left;  . EYE SURGERY Bilateral    Cataract Extraction with IOL  . GREEN LIGHT LASER TURP (TRANSURETHRAL RESECTION OF PROSTATE N/A 04/26/2018   Procedure: GREEN LIGHT LASER TURP (TRANSURETHRAL RESECTION OF PROSTATE;  Surgeon: Orson Ape, MD;  Location: [facility] ORS;  Service: Urology;  Laterality: N/A;  . INTRAVASCULAR PRESSURE WIRE/FFR STUDY N/A 09/15/2018   Procedure: INTRAVASCULAR PRESSURE WIRE/FFR STUDY;  Surgeon: Alwyn Pea, MD;  Location: [facility] INVASIVE CV LAB;  Service: Cardiovascular;  Laterality: N/A;  . JOINT REPLACEMENT  Right 2004   Shoulder Replacement  . JOINT REPLACEMENT Left 2007   Ankle Replacement  . LEFT HEART CATH AND CORONARY ANGIOGRAPHY Left 09/15/2018   Procedure: LEFT HEART CATH AND CORONARY ANGIOGRAPHY;  Surgeon: Alwyn Pea, MD;  Location: [facility] INVASIVE CV LAB;  Service: Cardiovascular;  Laterality: Left;  . PATELLAR TENDON REPAIR Left 07/15/2017   Procedure: PATELLA TENDON REPAIR;  Surgeon: Kennedy Bucker, MD;  Location: [facility] ORS;  Service: Orthopedics;  Laterality: Left;  Marland Kitchen QUADRICEPS TENDON REPAIR Left 07/15/2017   Procedure: REPAIR QUADRICEP TENDON;  Surgeon: Kennedy Bucker, MD;  Location: [facility] ORS;  Service: Orthopedics;  Laterality: Left;  . TONSILLECTOMY    . VASECTOMY      There were no vitals filed for this visit.  Subjective Assessment - 09/05/19 1116    Subjective  Pt reports that he is doing alright today. He is having some mild low back soreness upon arrival today which he rates as 1-2/10. He is performing HEP and notices some slight soreness afterwards but cannot identify the specific exercise which aggravates his symptoms.    Pertinent History  Pt was previously seeing pelvic health PT for urinary incontinence s/p laser TURP procedure October of 2019. With most of his urinary incontinence now resolved he was referred onward due to ongoing LE weakness. Pt reports a history of spinal stenosis with bilateral hip and  BLE pain since July of 2019. He also reports bilateral hip OA. He had an MRI which showed severe spinal stenosis. He was told that he is not a surgical candidate. Pt struggles with his balance and reports approximately 4 falls in the last 6 months. He typically catches his toes resulting in falling forward. He intermittently uses hiking poles for ambulation. He reports DOE with very minimal walking and underwent pulmonary rehab in the early part of 2019 without any improvement in his breathing. Pt would like to improve his leg strength, balance, and endurance with walking.  Interval History: 08/22/19: Pt reports that he is not having further LE pain but has been left with BLE weakness. He reports "a couple falls in the last 6 months." He had a NCV test 6-8 months which showed poor nerve conductivity bilateral LE. He was referred back for therapy for additional LE strengthening.    Limitations  Walking    How long can you walk comfortably?  3/8 mile    Diagnostic tests  Lumbar MRI 03/02/2018: Severe L3-4 stenosis, Mod/severe L4-5 stenosis, Severe L5-S1 stenosis, NCV: BLE nerve conductivity deficits    Patient Stated Goals  Improve leg strength and "decreasing spinal stenosis symptoms"    Currently in Pain?  Yes    Pain Score  2     Pain Location  Back    Pain Orientation  Right;Left    Pain Descriptors / Indicators  Aching    Pain Type  Chronic pain    Pain Onset  More than a month ago          TREATMENT   Ther-ex  NuStep BLE L2-4 x 5 minutes for warm-up with therapist adjusting the resistance appropriately. History obtained; Precor leg press BLE 40# 2 x 20; Precor heel raises BLE 40# x 20; Hooklying lumbar posterior pelvic tilt 5s hold x 10; Hooklying bridges with cues to maintain posterior tilt during bridge 3-5s hold x 10; Hooklying penguins 3s hold x 10 each direction;   Manual Therapy  Supine single knee to chest stretch 45s hold bilateral; Double knee to chest stretch by therapist 45s hold; Hooklying lumbar rotation stretch 15-20s hold x 3 bilateral; Long axis hip distraction for lumbar mobilization grade II, 30s/bout x 2 bouts on each side; Hip inferior mobilizations for lumbar traction with hips flexed to 45 degrees, grade III, 20s/bout x 2 bouts on each side;   Pt educated throughout session about proper posture and technique with exercises. Improved exercise technique, movement at target joints, use of target muscles after min to mod verbal, visual, tactile cues.     Pt demonstrates good motivation during session today. Initiated leg  press with patient today and he has considerable weakness in his BLE. Also demonstrates weakness with bilateral heel raises. Progress supine/hooklying strengthening as well as manual techniques for lumbar stenosis. Pt encouraged to continue his HEP and follow-up as scheduled. Pt will benefit from PT services to address deficits in strength, balance, and mobility in order to return to full function at home.                         PT Short Term Goals - 08/22/19 1136      PT SHORT TERM GOAL #1   Title  Pt will be independent with HEP in order to improve strength and balance in order to decrease fall risk and improve function at home and decrease risk for falls    Time  6    Period  Weeks    Status  New    Target Date  10/04/19        PT Long Term Goals - 08/22/19 1136      PT LONG TERM GOAL #1   Title  Pt will improve BERG by at least 3 points in order to demonstrate clinically significant improvement in balance.     Baseline  08/22/19: 47/56    Time  12    Period  Weeks    Status  New    Target Date  11/14/19      PT LONG TERM GOAL #2   Title  Pt will improve ABC by at least 13% in order to demonstrate clinically significant improvement in balance confidence.    Baseline  08/22/19: 60.6%    Time  12    Period  Weeks    Status  New    Target Date  11/14/19      PT LONG TERM GOAL #3   Title  Pt will be able to perform at least 5 sit to stands from regular height chair without UE assistance    Baseline  08/23/19: Able to perform 2 but limited by weakness and R knee pain    Time  12    Period  Weeks    Status  New    Target Date  11/14/19      PT LONG TERM GOAL #4   Title  Pt will increase self selected 10MWT gait speed by at least 0.13 m/s in order to demonstrate clinically significant improvement in community ambulation.    Baseline  08/22/19: 11.0s =  0.91 m/s;    Time  12    Period  Weeks    Status  New    Target Date  11/14/19            Plan -  09/05/19 1130    Clinical Impression Statement  Pt demonstrates good motivation during session today. Initiated leg press with patient today and he has considerable weakness in his BLE. Also demonstrates weakness with bilateral heel raises. Progress supine/hooklying strengthening as well as manual techniques for lumbar stenosis. Pt encouraged to continue his HEP and follow-up as scheduled. Pt will benefit from PT services to address deficits in strength, balance, and mobility in order to return to full function at home.    Personal Factors and Comorbidities  Comorbidity 3+    Comorbidities  spinal stenosis, COPD, CKD, polymyalgia rheumatica    Examination-Activity Limitations  Bend;Locomotion Level;Transfers;Squat;Stairs    Examination-Participation Restrictions  Community Activity;Yard Work;Cleaning    Stability/Clinical Decision Making  Unstable/Unpredictable    Rehab Potential  Fair    PT Frequency  2x / week    PT Duration  12 weeks    PT Treatment/Interventions  Moist Heat;Manual techniques;Patient/family education;Therapeutic activities;Therapeutic exercise;Stair training;Neuromuscular re-education;Balance training;Scar mobilization;Energy conservation;Electrical Stimulation;Cryotherapy;Biofeedback;Functional mobility training;Taping;ADLs/Self Care Home Management;Aquatic Therapy;Canalith Repostioning;Iontophoresis 4mg /ml Dexamethasone;Traction;Ultrasound;DME Instruction;Gait training;Passive range of motion;Dry needling;Vestibular;Spinal Manipulations;Joint Manipulations    PT Next Visit Plan  Progress LE strengthening and balance training, address spinal stenosis as able, pt would like to work on floor to stand transfers    [place] Access Code: [email or web address]    Consulted and Agree with Plan of Care  Patient       Patient will benefit from skilled therapeutic intervention in order to improve the following deficits and impairments:  Abnormal gait, Decreased endurance,  Decreased activity tolerance, Decreased strength, Decreased balance,  Difficulty walking, Decreased mobility, Cardiopulmonary status limiting activity, Pain  Visit Diagnosis: Muscle weakness (generalized)  Other abnormalities of gait and mobility  Chronic bilateral low back pain, unspecified whether sciatica present     Problem List Patient Active Problem List   Diagnosis Date Noted  . Sepsis (HCC) 03/17/2018   Lynnea Maizes PT, DPT, GCS  Eyden Dobie 09/05/2019, 12:42 PM  [place] [facility] MAIN [facility] SERVICES [address] [place], [place], [address] Phone: [phone]   Fax:  [phone]  Name: Quincy Boy MRN: [id number] Date of Birth: 02-Apr-1928

## 2019-09-12 ENCOUNTER — Encounter: Payer: Self-pay | Admitting: Physical Therapy

## 2019-09-12 ENCOUNTER — Other Ambulatory Visit: Payer: Self-pay

## 2019-09-12 ENCOUNTER — Ambulatory Visit: Payer: Medicare PPO | Admitting: Physical Therapy

## 2019-09-12 DIAGNOSIS — M4125 Other idiopathic scoliosis, thoracolumbar region: Secondary | ICD-10-CM

## 2019-09-12 DIAGNOSIS — R2689 Other abnormalities of gait and mobility: Secondary | ICD-10-CM

## 2019-09-12 DIAGNOSIS — M545 Low back pain, unspecified: Secondary | ICD-10-CM

## 2019-09-12 DIAGNOSIS — R278 Other lack of coordination: Secondary | ICD-10-CM

## 2019-09-12 DIAGNOSIS — G8929 Other chronic pain: Secondary | ICD-10-CM

## 2019-09-12 DIAGNOSIS — Z9181 History of falling: Secondary | ICD-10-CM

## 2019-09-12 DIAGNOSIS — M6281 Muscle weakness (generalized): Secondary | ICD-10-CM | POA: Diagnosis not present

## 2019-09-12 DIAGNOSIS — M25551 Pain in right hip: Secondary | ICD-10-CM

## 2019-09-12 DIAGNOSIS — M25552 Pain in left hip: Secondary | ICD-10-CM

## 2019-09-12 NOTE — Therapy (Signed)
[place] [facility] MAIN [facility] SERVICES [address] [place], [place], [address] Phone: [phone]   Fax:  [phone]  Physical Therapy Treatment  Patient Details  Name: Caleb Khan MRN: [id number] Date of Birth: 1927/08/18 Referring Provider (PT): Dr. Tresa Moore   Encounter Date: 09/12/2019  PT End of Session - 09/12/19 1427    Visit Number  4    Number of Visits  25    Date for PT Re-Evaluation  11/14/19    Authorization Type  Eval 08/22/19    PT Start Time  0150    PT Stop Time  0230    PT Time Calculation (min)  40 min    Equipment Utilized During Treatment  Gait belt    Activity Tolerance  Patient tolerated treatment well    Behavior During Therapy  [facility] for tasks assessed/performed       Past Medical History:  Diagnosis Date  . Anemia   . Anginal pain (HCC)   . Arthritis    osteoarthritis  . Chronic kidney disease    stage 3  . COPD (chronic obstructive pulmonary disease) (HCC)    NO INHALERS  . Coronary artery disease   . Dyspnea    with exertion  . Hypertension   . Polymyalgia rheumatica (HCC)    TAKES PREDNISONE  . Spinal stenosis     Past Surgical History:  Procedure Laterality Date  . CARDIAC CATHETERIZATION Left 05/22/2016   Procedure: Left Heart Cath and Coronary Angiography;  Surgeon: Alwyn Pea, MD;  Location: [facility] INVASIVE CV LAB;  Service: Cardiovascular;  Laterality: Left;  . EYE SURGERY Bilateral    Cataract Extraction with IOL  . GREEN LIGHT LASER TURP (TRANSURETHRAL RESECTION OF PROSTATE N/A 04/26/2018   Procedure: GREEN LIGHT LASER TURP (TRANSURETHRAL RESECTION OF PROSTATE;  Surgeon: Orson Ape, MD;  Location: [facility] ORS;  Service: Urology;  Laterality: N/A;  . INTRAVASCULAR PRESSURE WIRE/FFR STUDY N/A 09/15/2018   Procedure: INTRAVASCULAR PRESSURE WIRE/FFR STUDY;  Surgeon: Alwyn Pea, MD;  Location: [facility] INVASIVE CV LAB;  Service: Cardiovascular;  Laterality: N/A;  . JOINT REPLACEMENT  Right 2004   Shoulder Replacement  . JOINT REPLACEMENT Left 2007   Ankle Replacement  . LEFT HEART CATH AND CORONARY ANGIOGRAPHY Left 09/15/2018   Procedure: LEFT HEART CATH AND CORONARY ANGIOGRAPHY;  Surgeon: Alwyn Pea, MD;  Location: [facility] INVASIVE CV LAB;  Service: Cardiovascular;  Laterality: Left;  . PATELLAR TENDON REPAIR Left 07/15/2017   Procedure: PATELLA TENDON REPAIR;  Surgeon: Kennedy Bucker, MD;  Location: [facility] ORS;  Service: Orthopedics;  Laterality: Left;  Marland Kitchen QUADRICEPS TENDON REPAIR Left 07/15/2017   Procedure: REPAIR QUADRICEP TENDON;  Surgeon: Kennedy Bucker, MD;  Location: [facility] ORS;  Service: Orthopedics;  Laterality: Left;  . TONSILLECTOMY    . VASECTOMY      There were no vitals filed for this visit.  Subjective Assessment - 09/12/19 1424    Subjective  Patient is doing ok today. He is not reporting any pain today.    Pertinent History  Pt was previously seeing pelvic health PT for urinary incontinence s/p laser TURP procedure October of 2019. With most of his urinary incontinence now resolved he was referred onward due to ongoing LE weakness. Pt reports a history of spinal stenosis with bilateral hip and BLE pain since July of 2019. He also reports bilateral hip OA. He had an MRI which showed severe spinal stenosis. He was told that he is not a surgical candidate. Pt  struggles with his balance and reports approximately 4 falls in the last 6 months. He typically catches his toes resulting in falling forward. He intermittently uses hiking poles for ambulation. He reports DOE with very minimal walking and underwent pulmonary rehab in the early part of 2019 without any improvement in his breathing. Pt would like to improve his leg strength, balance, and endurance with walking. Interval History: 08/22/19: Pt reports that he is not having further LE pain but has been left with BLE weakness. He reports "a couple falls in the last 6 months." He had a NCV test 6-8 months which  showed poor nerve conductivity bilateral LE. He was referred back for therapy for additional LE strengthening.    Currently in Pain?  No/denies    Pain Score  0-No pain       Neuromuscular Re-education   Tandem gait on level surface in parallel bars without UE support x 2 lengths Side stepping on blue foam without UE support x 2 lengths Heel/toe raises without UE support 3s hold x 10 each 1/2 foam roll balance with flat side up 30s x 2 reps 1/2 foam roll balance with flat side down 30s x 2 reps 1/2 foam roll tandem balance alternating forward LE 30s x 2 each LE forward Matrix 22 lbs fwd/bwd  x 3 reps Leg press 40 lbs x 20 x 2  Leg press heel raises x 40 lbs x 40 reps Standing on foam  And one LE on stool and trunk rotation x 2 mins       Pt educated throughout session about proper posture and technique with exercises. Improved exercise technique, movement at target joints, use of target muscles after min to mod verbal, visual, tactile cues. CGA and Min to mod verbal cues used throughout with increased in postural sway and LOB most seen with narrow base of support and while on uneven surfaces.                       PT Education - 09/12/19 1425    Education Details  HEP    Person(s) Educated  Patient    Methods  Explanation;Verbal cues    Comprehension  Verbalized understanding;Verbal cues required;Need further instruction       PT Short Term Goals - 08/22/19 1136      PT SHORT TERM GOAL #1   Title  Pt will be independent with HEP in order to improve strength and balance in order to decrease fall risk and improve function at home and decrease risk for falls    Time  6    Period  Weeks    Status  New    Target Date  10/04/19        PT Long Term Goals - 08/22/19 1136      PT LONG TERM GOAL #1   Title  Pt will improve BERG by at least 3 points in order to demonstrate clinically significant improvement in balance.     Baseline  08/22/19: 47/56    Time   12    Period  Weeks    Status  New    Target Date  11/14/19      PT LONG TERM GOAL #2   Title  Pt will improve ABC by at least 13% in order to demonstrate clinically significant improvement in balance confidence.    Baseline  08/22/19: 60.6%    Time  12    Period  Weeks  Status  New    Target Date  11/14/19      PT LONG TERM GOAL #3   Title  Pt will be able to perform at least 5 sit to stands from regular height chair without UE assistance    Baseline  08/23/19: Able to perform 2 but limited by weakness and R knee pain    Time  12    Period  Weeks    Status  New    Target Date  11/14/19      PT LONG TERM GOAL #4   Title  Pt will increase self selected 10MWT gait speed by at least 0.13 m/s in order to demonstrate clinically significant improvement in community ambulation.    Baseline  08/22/19: 11.0s =  0.91 m/s;    Time  12    Period  Weeks    Status  New    Target Date  11/14/19            Plan - 09/12/19 1427    Clinical Impression Statement  Pt presents with unsteadiness on uneven surfaces and fatigues with therapeutic exercises.  Patient tolerated all interventions well this date and will benefit from continued skilled PT interventions to improve strength and balance and decrease risk of falling.   Personal Factors and Comorbidities  Comorbidity 3+    Comorbidities  spinal stenosis, COPD, CKD, polymyalgia rheumatica    Examination-Activity Limitations  Bend;Locomotion Level;Transfers;Squat;Stairs    Examination-Participation Restrictions  Community Activity;Yard Work;Cleaning    Stability/Clinical Decision Making  Unstable/Unpredictable    Rehab Potential  Fair    PT Frequency  2x / week    PT Duration  12 weeks    PT Treatment/Interventions  Moist Heat;Manual techniques;Patient/family education;Therapeutic activities;Therapeutic exercise;Stair training;Neuromuscular re-education;Balance training;Scar mobilization;Energy conservation;Electrical  Stimulation;Cryotherapy;Biofeedback;Functional mobility training;Taping;ADLs/Self Care Home Management;Aquatic Therapy;Canalith Repostioning;Iontophoresis 4mg /ml Dexamethasone;Traction;Ultrasound;DME Instruction;Gait training;Passive range of motion;Dry needling;Vestibular;Spinal Manipulations;Joint Manipulations    PT Next Visit Plan  Progress LE strengthening and balance training, address spinal stenosis as able, pt would like to work on floor to stand transfers    [place] Access Code: [email or web address]    Consulted and Agree with Plan of Care  Patient       Patient will benefit from skilled therapeutic intervention in order to improve the following deficits and impairments:  Abnormal gait, Decreased endurance, Decreased activity tolerance, Decreased strength, Decreased balance, Difficulty walking, Decreased mobility, Cardiopulmonary status limiting activity, Pain  Visit Diagnosis: Muscle weakness (generalized)  Other abnormalities of gait and mobility  Chronic bilateral low back pain, unspecified whether sciatica present  History of falling  Pain in left hip  Pain in right hip  Other idiopathic scoliosis, thoracolumbar region  Other lack of coordination     Problem List Patient Active Problem List   Diagnosis Date Noted  . Sepsis ([place]) 03/17/2018    Alanson Puls, [place] DPT 09/12/2019, 2:28 PM  [place] MAIN [facility] SERVICES [address] [place], [place], [address] Phone: [phone]   Fax:  [phone]  Name: Caleb Khan MRN: [id number] Date of Birth: 1928/03/20

## 2019-09-19 ENCOUNTER — Ambulatory Visit: Payer: Medicare PPO

## 2019-09-19 ENCOUNTER — Other Ambulatory Visit: Payer: Self-pay

## 2019-09-19 DIAGNOSIS — M6281 Muscle weakness (generalized): Secondary | ICD-10-CM | POA: Diagnosis not present

## 2019-09-19 NOTE — Therapy (Signed)
[place] [facility] MAIN [facility] SERVICES [address] [place], [place], [address] Phone: [phone]   Fax:  [phone]  Physical Therapy Treatment  Patient Details  Name: Caleb Khan MRN: [id number] Date of Birth: 1928-01-24 Referring Provider (PT): Dr. Tresa Moore   Encounter Date: 09/19/2019  PT End of Session - 09/19/19 1353    Visit Number  5    Number of Visits  25    Date for PT Re-Evaluation  11/14/19    Authorization Type  Eval 08/22/19    PT Start Time  1345    PT Stop Time  1430    PT Time Calculation (min)  45 min    Equipment Utilized During Treatment  Gait belt    Activity Tolerance  Patient tolerated treatment well    Behavior During Therapy  [facility] for tasks assessed/performed       Past Medical History:  Diagnosis Date  . Anemia   . Anginal pain (HCC)   . Arthritis    osteoarthritis  . Chronic kidney disease    stage 3  . COPD (chronic obstructive pulmonary disease) (HCC)    NO INHALERS  . Coronary artery disease   . Dyspnea    with exertion  . Hypertension   . Polymyalgia rheumatica (HCC)    TAKES PREDNISONE  . Spinal stenosis     Past Surgical History:  Procedure Laterality Date  . CARDIAC CATHETERIZATION Left 05/22/2016   Procedure: Left Heart Cath and Coronary Angiography;  Surgeon: Alwyn Pea, MD;  Location: [facility] INVASIVE CV LAB;  Service: Cardiovascular;  Laterality: Left;  . EYE SURGERY Bilateral    Cataract Extraction with IOL  . GREEN LIGHT LASER TURP (TRANSURETHRAL RESECTION OF PROSTATE N/A 04/26/2018   Procedure: GREEN LIGHT LASER TURP (TRANSURETHRAL RESECTION OF PROSTATE;  Surgeon: Orson Ape, MD;  Location: [facility] ORS;  Service: Urology;  Laterality: N/A;  . INTRAVASCULAR PRESSURE WIRE/FFR STUDY N/A 09/15/2018   Procedure: INTRAVASCULAR PRESSURE WIRE/FFR STUDY;  Surgeon: Alwyn Pea, MD;  Location: [facility] INVASIVE CV LAB;  Service: Cardiovascular;  Laterality: N/A;  . JOINT REPLACEMENT  Right 2004   Shoulder Replacement  . JOINT REPLACEMENT Left 2007   Ankle Replacement  . LEFT HEART CATH AND CORONARY ANGIOGRAPHY Left 09/15/2018   Procedure: LEFT HEART CATH AND CORONARY ANGIOGRAPHY;  Surgeon: Alwyn Pea, MD;  Location: [facility] INVASIVE CV LAB;  Service: Cardiovascular;  Laterality: Left;  . PATELLAR TENDON REPAIR Left 07/15/2017   Procedure: PATELLA TENDON REPAIR;  Surgeon: Kennedy Bucker, MD;  Location: [facility] ORS;  Service: Orthopedics;  Laterality: Left;  Marland Kitchen QUADRICEPS TENDON REPAIR Left 07/15/2017   Procedure: REPAIR QUADRICEP TENDON;  Surgeon: Kennedy Bucker, MD;  Location: [facility] ORS;  Service: Orthopedics;  Laterality: Left;  . TONSILLECTOMY    . VASECTOMY      There were no vitals filed for this visit.  Subjective Assessment - 09/19/19 1352    Subjective  Pt reports that he is doing alright today. He denies any back pain or LE soreness/pain. He is performing HEP and has no specific questions or concerns upon arrival today.    Pertinent History  Pt was previously seeing pelvic health PT for urinary incontinence s/p laser TURP procedure October of 2019. With most of his urinary incontinence now resolved he was referred onward due to ongoing LE weakness. Pt reports a history of spinal stenosis with bilateral hip and BLE pain since July of 2019. He also reports bilateral hip OA. He had  an MRI which showed severe spinal stenosis. He was told that he is not a surgical candidate. Pt struggles with his balance and reports approximately 4 falls in the last 6 months. He typically catches his toes resulting in falling forward. He intermittently uses hiking poles for ambulation. He reports DOE with very minimal walking and underwent pulmonary rehab in the early part of 2019 without any improvement in his breathing. Pt would like to improve his leg strength, balance, and endurance with walking. Interval History: 08/22/19: Pt reports that he is not having further LE pain but has been left  with BLE weakness. He reports "a couple falls in the last 6 months." He had a NCV test 6-8 months which showed poor nerve conductivity bilateral LE. He was referred back for therapy for additional LE strengthening.    Limitations  Walking    How long can you walk comfortably?  3/8 mile    Diagnostic tests  Lumbar MRI 03/02/2018: Severe L3-4 stenosis, Mod/severe L4-5 stenosis, Severe L5-S1 stenosis, NCV: BLE nerve conductivity deficits    Patient Stated Goals  Improve leg strength and "decreasing spinal stenosis symptoms"    Currently in Pain?  No/denies         TREATMENT   Ther-ex NuStep BLE L2 x 5 minutes for warm-up during history; Precor leg press BLE 45# x 20, 55# x 20; Standing hip 4 way flexion, extension, abduction, HS curl with 3# ankle weight (AW) x 15 each bilateral; Seated LAQ with 3# AW x 15 bilateral; Standing heel raises with BUE support x 15, minimal heel clearance noted; Hooklying lumbar posterior pelvic tilt 5s hold x 15; Hooklying bridges with cues to maintain posterior tilt during bridge 3-5s hold x 10; Hooklying penguins 3s hold x 10 each direction;   Manual Therapy Supine single knee to chest stretch 45s hold bilateral; Double knee to chest stretch by therapist 45s hold; FABER stretch x 45s bilateral; FADIR stretch x 45s bilateral; Bilateral HS stretch with ankle DF/PF x 45s on each side; Hooklying lumbar rotation stretch 30s hold bilateral; Long axis hip distraction for lumbar mobilization grade II, 30s/bout x 2 bouts on each side; Hip inferior mobilizations for lumbar traction with hips flexed to 45 degrees, grade II, 20s/bout x 2 bouts on each side;   Pt educated throughout session about proper posture and technique with exercises. Improved exercise technique, movement at target joints, use of target muscles after min to mod verbal, visual, tactile cues.   Pt demonstrates good motivation during session today. Continued leg press with patient  today and increased the resistance. Also continued with bilateral hip stretching and manual therapy for low back. Pt encouraged to continue his HEP and follow-up as scheduled.Pt will benefit from PT services to address deficits in strength, balance, and mobility in order to return to full function at home.                        PT Short Term Goals - 08/22/19 1136      PT SHORT TERM GOAL #1   Title  Pt will be independent with HEP in order to improve strength and balance in order to decrease fall risk and improve function at home and decrease risk for falls    Time  6    Period  Weeks    Status  New    Target Date  10/04/19        PT Long Term Goals - 08/22/19 1136  PT LONG TERM GOAL #1   Title  Pt will improve BERG by at least 3 points in order to demonstrate clinically significant improvement in balance.     Baseline  08/22/19: 47/56    Time  12    Period  Weeks    Status  New    Target Date  11/14/19      PT LONG TERM GOAL #2   Title  Pt will improve ABC by at least 13% in order to demonstrate clinically significant improvement in balance confidence.    Baseline  08/22/19: 60.6%    Time  12    Period  Weeks    Status  New    Target Date  11/14/19      PT LONG TERM GOAL #3   Title  Pt will be able to perform at least 5 sit to stands from regular height chair without UE assistance    Baseline  08/23/19: Able to perform 2 but limited by weakness and R knee pain    Time  12    Period  Weeks    Status  New    Target Date  11/14/19      PT LONG TERM GOAL #4   Title  Pt will increase self selected gait speed by at least 0.13 m/s in order to demonstrate clinically significant improvement in community ambulation.    Baseline  08/22/19: 11.0s =  0.91 m/s;    Time  12    Period  Weeks    Status  New    Target Date  11/14/19            Plan - 09/19/19 1353    Clinical Impression Statement  Pt demonstrates good motivation during session  today. Continued leg press with patient today and increased the resistance. Also continued with bilateral hip stretching and manual therapy for low back. Pt encouraged to continue his HEP and follow-up as scheduled. Pt will benefit from PT services to address deficits in strength, balance, and mobility in order to return to full function at home.    Personal Factors and Comorbidities  Comorbidity 3+    Comorbidities  spinal stenosis, COPD, CKD, polymyalgia rheumatica    Examination-Activity Limitations  Bend;Locomotion Level;Transfers;Squat;Stairs    Examination-Participation Restrictions  Community Activity;Yard Work;Cleaning    Stability/Clinical Decision Making  Unstable/Unpredictable    Rehab Potential  Fair    PT Frequency  2x / week    PT Duration  12 weeks    PT Treatment/Interventions  Moist Heat;Manual techniques;Patient/family education;Therapeutic activities;Therapeutic exercise;Stair training;Neuromuscular re-education;Balance training;Scar mobilization;Energy conservation;Electrical Stimulation;Cryotherapy;Biofeedback;Functional mobility training;Taping;ADLs/Self Care Home Management;Aquatic Therapy;Canalith Repostioning;Iontophoresis 4mg /ml Dexamethasone;Traction;Ultrasound;DME Instruction;Gait training;Passive range of motion;Dry needling;Vestibular;Spinal Manipulations;Joint Manipulations    PT Next Visit Plan  Progress LE strengthening and balance training, address spinal stenosis as able, pt would like to work on floor to stand transfers    PT Home Exercise Plan  [employer] Access Code:    Consulted and Agree with Plan of Care  Patient       Patient will benefit from skilled therapeutic intervention in order to improve the following deficits and impairments:  Abnormal gait, Decreased endurance, Decreased activity tolerance, Decreased strength, Decreased balance, Difficulty walking, Decreased mobility, Cardiopulmonary status limiting activity, Pain  Visit Diagnosis: Muscle  weakness (generalized)     Problem List Patient Active Problem List   Diagnosis Date Noted  . Sepsis (HCC) 03/17/2018   03/19/2018 Tempestt Silba PT, DPT, G[facility]  Chole Driver 09/19/2019, 3:54 PM  [place] MAIN [facility] SERVICES [address] [place], [place], [address] Phone: [phone]   Fax:  [phone]  Name: Terral Cooks MRN: [id number] Date of Birth: 04/25/1928

## 2019-09-26 ENCOUNTER — Other Ambulatory Visit: Payer: Self-pay

## 2019-09-26 ENCOUNTER — Ambulatory Visit: Payer: Medicare PPO | Attending: Internal Medicine

## 2019-09-26 DIAGNOSIS — M6281 Muscle weakness (generalized): Secondary | ICD-10-CM

## 2019-09-26 DIAGNOSIS — R2689 Other abnormalities of gait and mobility: Secondary | ICD-10-CM | POA: Diagnosis present

## 2019-09-26 NOTE — Therapy (Signed)
[facility] MAIN [facility] SERVICES [address] [place], [place], [address] Phone: [phone]   Fax:  [phone]  Physical Therapy Treatment  Patient Details  Name: Caleb Khan MRN: [id number] Date of Birth: Jan 27, 1928 Referring Provider (PT): Dr. Tresa Moore   Encounter Date: 09/26/2019  PT End of Session - 09/26/19 1411    Visit Number  6    Number of Visits  25    Date for PT Re-Evaluation  11/14/19    Authorization Type  Eval 08/22/19    PT Start Time  1347    PT Stop Time  1430    PT Time Calculation (min)  43 min    Equipment Utilized During Treatment  Gait belt    Activity Tolerance  Patient tolerated treatment well    Behavior During Therapy  [facility] for tasks assessed/performed       Past Medical History:  Diagnosis Date  . Anemia   . Anginal pain (HCC)   . Arthritis    osteoarthritis  . Chronic kidney disease    stage 3  . COPD (chronic obstructive pulmonary disease) (HCC)    NO INHALERS  . Coronary artery disease   . Dyspnea    with exertion  . Hypertension   . Polymyalgia rheumatica (HCC)    TAKES PREDNISONE  . Spinal stenosis     Past Surgical History:  Procedure Laterality Date  . CARDIAC CATHETERIZATION Left 05/22/2016   Procedure: Left Heart Cath and Coronary Angiography;  Surgeon: Alwyn Pea, MD;  Location: [facility] INVASIVE CV LAB;  Service: Cardiovascular;  Laterality: Left;  . EYE SURGERY Bilateral    Cataract Extraction with IOL  . GREEN LIGHT LASER TURP (TRANSURETHRAL RESECTION OF PROSTATE N/A 04/26/2018   Procedure: GREEN LIGHT LASER TURP (TRANSURETHRAL RESECTION OF PROSTATE;  Surgeon: Orson Ape, MD;  Location: [facility] ORS;  Service: Urology;  Laterality: N/A;  . INTRAVASCULAR PRESSURE WIRE/FFR STUDY N/A 09/15/2018   Procedure: INTRAVASCULAR PRESSURE WIRE/FFR STUDY;  Surgeon: Alwyn Pea, MD;  Location: [facility] INVASIVE CV LAB;  Service: Cardiovascular;  Laterality: N/A;  . JOINT REPLACEMENT  Right 2004   Shoulder Replacement  . JOINT REPLACEMENT Left 2007   Ankle Replacement  . LEFT HEART CATH AND CORONARY ANGIOGRAPHY Left 09/15/2018   Procedure: LEFT HEART CATH AND CORONARY ANGIOGRAPHY;  Surgeon: Alwyn Pea, MD;  Location: [facility] INVASIVE CV LAB;  Service: Cardiovascular;  Laterality: Left;  . PATELLAR TENDON REPAIR Left 07/15/2017   Procedure: PATELLA TENDON REPAIR;  Surgeon: Kennedy Bucker, MD;  Location: [facility] ORS;  Service: Orthopedics;  Laterality: Left;  Marland Kitchen QUADRICEPS TENDON REPAIR Left 07/15/2017   Procedure: REPAIR QUADRICEP TENDON;  Surgeon: Kennedy Bucker, MD;  Location: [facility] ORS;  Service: Orthopedics;  Laterality: Left;  . TONSILLECTOMY    . VASECTOMY      There were no vitals filed for this visit.  Subjective Assessment - 09/26/19 1410    Subjective  Pt reports that he is doing alright today. He denies any back pain or LE soreness/pain. He is performing HEP but reports no perceived benefit from the knee to chest stretch. No specific questions or concerns upon arrival today.    Pertinent History  Pt was previously seeing pelvic health PT for urinary incontinence s/p laser TURP procedure October of 2019. With most of his urinary incontinence now resolved he was referred onward due to ongoing LE weakness. Pt reports a history of spinal stenosis with bilateral hip and BLE pain since July of  2019. He also reports bilateral hip OA. He had an MRI which showed severe spinal stenosis. He was told that he is not a surgical candidate. Pt struggles with his balance and reports approximately 4 falls in the last 6 months. He typically catches his toes resulting in falling forward. He intermittently uses hiking poles for ambulation. He reports DOE with very minimal walking and underwent pulmonary rehab in the early part of 2019 without any improvement in his breathing. Pt would like to improve his leg strength, balance, and endurance with walking. Interval History: 08/22/19: Pt reports  that he is not having further LE pain but has been left with BLE weakness. He reports "a couple falls in the last 6 months." He had a NCV test 6-8 months which showed poor nerve conductivity bilateral LE. He was referred back for therapy for additional LE strengthening.    Limitations  Walking    How long can you walk comfortably?  3/8 mile    Diagnostic tests  Lumbar MRI 03/02/2018: Severe L3-4 stenosis, Mod/severe L4-5 stenosis, Severe L5-S1 stenosis, NCV: BLE nerve conductivity deficits    Patient Stated Goals  Improve leg strength and "decreasing spinal stenosis symptoms"    Currently in Pain?  No/denies          TREATMENT   Ther-ex Hooklying lumbar posterior pelvic tilt 5s hold x 15; Hooklying bridges with cues to maintain posterior tilt during bridge 3-5s hold x 10; Hooklying isometric resisted trunk rotation 3s hold x 10 bilateral; Hooklying clams with manual resistance x 10 bilateral; Hooklying adductor squeeze with manual resistance x 10 bilateral; Hooklying pball press down from core strengthening 3s hold 2 x 10; Attempted sit to stand but had to discontinue due to R knee pain; Precor leg press BLE 55# x 20, 60# x 20;   Manual Therapy FABER stretch x 45s bilateral; FADIR stretch x 45s bilateral; Bilateral HS stretch x 45s on each side; Hooklying lumbar rotation stretch 30s holdbilateral; Long axis hip distraction for lumbar mobilization grade II, 30s/bout x 2 bouts on each side;   Neuromuscular Re-education  NBOS eye closed x 30s; NBOS horizontal and vertical head turns x 30s each; 6" step taps alternating LE x 10 each; Airex NBOS eye closed x 30s; Airex NBOS horizontal and vertical head turns x 30s each; Airex 6" step taps alternating LE x 10 each;   Pt educated throughout session about proper posture and technique with exercises. Improved exercise technique, movement at target joints, use of target muscles after min to mod verbal, visual, tactile  cues.   Pt demonstrates good motivation during session today. Continued leg press with patient today and increased the resistance. Also continued with bilateral hip stretching and manual therapy for low back. Initiated some balance exercises with patient today as he complains of persistent difficulty with his balance. Overall he is making progress with therapy however reports minimal perceived benefit. Pt encouraged to continue his HEP and follow-up as scheduled.Pt will benefit from PT services to address deficits in strength, balance, and mobility in order to return to full function at home.                        PT Short Term Goals - 08/22/19 1136      PT SHORT TERM GOAL #1   Title  Pt will be independent with HEP in order to improve strength and balance in order to decrease fall risk and improve function at home and decrease  risk for falls    Time  6    Period  Weeks    Status  New    Target Date  10/04/19        PT Long Term Goals - 08/22/19 1136      PT LONG TERM GOAL #1   Title  Pt will improve BERG by at least 3 points in order to demonstrate clinically significant improvement in balance.     Baseline  08/22/19: 47/56    Time  12    Period  Weeks    Status  New    Target Date  11/14/19      PT LONG TERM GOAL #2   Title  Pt will improve ABC by at least 13% in order to demonstrate clinically significant improvement in balance confidence.    Baseline  08/22/19: 60.6%    Time  12    Period  Weeks    Status  New    Target Date  11/14/19      PT LONG TERM GOAL #3   Title  Pt will be able to perform at least 5 sit to stands from regular height chair without UE assistance    Baseline  08/23/19: Able to perform 2 but limited by weakness and R knee pain    Time  12    Period  Weeks    Status  New    Target Date  11/14/19      PT LONG TERM GOAL #4   Title  Pt will increase self selected 10MWT gait speed by at least 0.13 m/s in order to demonstrate  clinically significant improvement in community ambulation.    Baseline  08/22/19: 11.0s =  0.91 m/s;    Time  12    Period  Weeks    Status  New    Target Date  11/14/19            Plan - 09/26/19 1411    Clinical Impression Statement  Pt demonstrates good motivation during session today. Continued leg press with patient today and increased the resistance. Also continued with bilateral hip stretching and manual therapy for low back. Initiated some balance exercises with patient today as he complains of persistent difficulty with his balance. Overall he is making progress with therapy however reports minimal perceived benefit. Pt encouraged to continue his HEP and follow-up as scheduled. Pt will benefit from PT services to address deficits in strength, balance, and mobility in order to return to full function at home.    Personal Factors and Comorbidities  Comorbidity 3+    Comorbidities  spinal stenosis, COPD, CKD, polymyalgia rheumatica    Examination-Activity Limitations  Bend;Locomotion Level;Transfers;Squat;Stairs    Examination-Participation Restrictions  Community Activity;Yard Work;Cleaning    Stability/Clinical Decision Making  Unstable/Unpredictable    Clinical Decision Making  Moderate    Rehab Potential  Fair    PT Frequency  2x / week    PT Duration  12 weeks    PT Treatment/Interventions  Moist Heat;Manual techniques;Patient/family education;Therapeutic activities;Therapeutic exercise;Stair training;Neuromuscular re-education;Balance training;Scar mobilization;Energy conservation;Electrical Stimulation;Cryotherapy;Biofeedback;Functional mobility training;Taping;ADLs/Self Care Home Management;Aquatic Therapy;Canalith Repostioning;Iontophoresis 4mg /ml Dexamethasone;Traction;Ultrasound;DME Instruction;Gait training;Passive range of motion;Dry needling;Vestibular;Spinal Manipulations;Joint Manipulations    PT Next Visit Plan  Progress LE strengthening and balance training, address  spinal stenosis as able, pt would like to work on floor to stand transfers    PT [place] Access Code: [email or web address]    Consulted and Agree with Plan of Care  Patient  Patient will benefit from skilled therapeutic intervention in order to improve the following deficits and impairments:  Abnormal gait, Decreased endurance, Decreased activity tolerance, Decreased strength, Decreased balance, Difficulty walking, Decreased mobility, Cardiopulmonary status limiting activity, Pain  Visit Diagnosis: Muscle weakness (generalized)  Other abnormalities of gait and mobility     Problem List Patient Active Problem List   Diagnosis Date Noted  . Sepsis (HCC) 03/17/2018   Lynnea Maizes PT, DPT, GCS  Jaxsun Ciampi 09/26/2019, 2:45 PM  [facility] MAIN [facility] SERVICES [address] [place], [place], [address] Phone: [phone]   Fax:  [phone]  Name: Caleb Khan MRN: [id number] Date of Birth: 07/15/1928

## 2019-10-03 ENCOUNTER — Ambulatory Visit: Payer: Medicare PPO

## 2019-10-03 DIAGNOSIS — M6281 Muscle weakness (generalized): Secondary | ICD-10-CM

## 2019-10-03 DIAGNOSIS — R2689 Other abnormalities of gait and mobility: Secondary | ICD-10-CM

## 2019-10-03 NOTE — Therapy (Signed)
[facility] MAIN [facility] SERVICES [address] [place], [place], [address] Phone: [phone]   Fax:  [phone]  Physical Therapy Treatment  Patient Details  Name: Caleb Khan MRN: [id number] Date of Birth: 1927-12-05 Referring Provider (PT): Dr. Tresa Moore   Encounter Date: 10/03/2019  PT End of Session - 10/03/19 1434    Visit Number  7    Number of Visits  25    Date for PT Re-Evaluation  11/14/19    Authorization Type  Eval 08/22/19    PT Start Time  1417    PT Stop Time  1500    PT Time Calculation (min)  43 min    Equipment Utilized During Treatment  Gait belt    Activity Tolerance  Patient tolerated treatment well    Behavior During Therapy  [facility] for tasks assessed/performed       Past Medical History:  Diagnosis Date  . Anemia   . Anginal pain (HCC)   . Arthritis    osteoarthritis  . Chronic kidney disease    stage 3  . COPD (chronic obstructive pulmonary disease) (HCC)    NO INHALERS  . Coronary artery disease   . Dyspnea    with exertion  . Hypertension   . Polymyalgia rheumatica (HCC)    TAKES PREDNISONE  . Spinal stenosis     Past Surgical History:  Procedure Laterality Date  . CARDIAC CATHETERIZATION Left 05/22/2016   Procedure: Left Heart Cath and Coronary Angiography;  Surgeon: Alwyn Pea, MD;  Location: [facility] INVASIVE CV LAB;  Service: Cardiovascular;  Laterality: Left;  . EYE SURGERY Bilateral    Cataract Extraction with IOL  . GREEN LIGHT LASER TURP (TRANSURETHRAL RESECTION OF PROSTATE N/A 04/26/2018   Procedure: GREEN LIGHT LASER TURP (TRANSURETHRAL RESECTION OF PROSTATE;  Surgeon: Orson Ape, MD;  Location: [facility] ORS;  Service: Urology;  Laterality: N/A;  . INTRAVASCULAR PRESSURE WIRE/FFR STUDY N/A 09/15/2018   Procedure: INTRAVASCULAR PRESSURE WIRE/FFR STUDY;  Surgeon: Alwyn Pea, MD;  Location: [facility] INVASIVE CV LAB;  Service: Cardiovascular;  Laterality: N/A;  . JOINT REPLACEMENT  Right 2004   Shoulder Replacement  . JOINT REPLACEMENT Left 2007   Ankle Replacement  . LEFT HEART CATH AND CORONARY ANGIOGRAPHY Left 09/15/2018   Procedure: LEFT HEART CATH AND CORONARY ANGIOGRAPHY;  Surgeon: Alwyn Pea, MD;  Location: [facility] INVASIVE CV LAB;  Service: Cardiovascular;  Laterality: Left;  . PATELLAR TENDON REPAIR Left 07/15/2017   Procedure: PATELLA TENDON REPAIR;  Surgeon: Kennedy Bucker, MD;  Location: [facility] ORS;  Service: Orthopedics;  Laterality: Left;  Marland Kitchen QUADRICEPS TENDON REPAIR Left 07/15/2017   Procedure: REPAIR QUADRICEP TENDON;  Surgeon: Kennedy Bucker, MD;  Location: [facility] ORS;  Service: Orthopedics;  Laterality: Left;  . TONSILLECTOMY    . VASECTOMY      There were no vitals filed for this visit.  Subjective Assessment - 10/03/19 1434    Subjective  Pt reports that he is doing alright today. He denies any back pain or LE soreness/pain. He is performing HEP. No specific questions or concerns upon arrival today. Reports that he has been on/off oral steroids based on conflicting recommendations by rheumatology/PCP as well as changes in patient's preference.    Pertinent History  Pt was previously seeing pelvic health PT for urinary incontinence s/p laser TURP procedure October of 2019. With most of his urinary incontinence now resolved he was referred onward due to ongoing LE weakness. Pt reports a history of spinal  stenosis with bilateral hip and BLE pain since July of 2019. He also reports bilateral hip OA. He had an MRI which showed severe spinal stenosis. He was told that he is not a surgical candidate. Pt struggles with his balance and reports approximately 4 falls in the last 6 months. He typically catches his toes resulting in falling forward. He intermittently uses hiking poles for ambulation. He reports DOE with very minimal walking and underwent pulmonary rehab in the early part of 2019 without any improvement in his breathing. Pt would like to improve his leg  strength, balance, and endurance with walking. Interval History: 08/22/19: Pt reports that he is not having further LE pain but has been left with BLE weakness. He reports "a couple falls in the last 6 months." He had a NCV test 6-8 months which showed poor nerve conductivity bilateral LE. He was referred back for therapy for additional LE strengthening.    Limitations  Walking    How long can you walk comfortably?  3/8 mile    Diagnostic tests  Lumbar MRI 03/02/2018: Severe L3-4 stenosis, Mod/severe L4-5 stenosis, Severe L5-S1 stenosis, NCV: BLE nerve conductivity deficits    Patient Stated Goals  Improve leg strength and "decreasing spinal stenosis symptoms"    Currently in Pain?  No/denies         TREATMENT   Ther-ex Hooklying lumbar posterior pelvic tilt 5s hold x 15; Hooklying bridges with cues to maintain posterior tilt during bridge 3-5s hold x 10; Hooklying isometric resisted trunk rotation 3s hold x 10 bilateral; Hooklying clams with manual resistance x 10 bilateral; Hooklying adductor squeeze with manual resistance x 10 bilateral; Hooklying pball press down for core strengthening 3s hold x 10; Hooklying pball diagonal press down (opposite arm/knee) for core strengthening 3s hold x 10 on each side; Precor leg press BLE 60# x 20, 70# x 20;  Standing exercises with 2.5# ankle weights bilaterally: Hip flexion marching x 15; Hip abduction x 15; Hip extension x 15; 6" forward step taps alternating LE x 15 each; 6" lateral step taps x 15 each; Heel/toe raises without UE support 3s hold x 10 each direction;   Manual Therapy FABER stretch x 45s bilateral; FADIR stretch x 45s bilateral; Bilateral HS stretch x 45s on each side; Long axis hip distraction for lumbar mobilization grade II, 30s/bout x 2 bouts on each side;   Pt educated throughout session about proper posture and technique with exercises. Improved exercise technique, movement at target joints, use of target  muscles after min to mod verbal, visual, tactile cues.   Pt demonstrates good motivation during session today.Continuedleg press with patient today andincreased the resistance again. Also continued with bilateral hip stretching and manual therapy for low back. Continued incorporating balance activities during strengthening. He complains of some L shoulder pain and does demonstrates painful and limited L shoulder AROM. Pt encouraged to obtain order to address his shoulder once he is content ending therapy for his back/balance. Pt encouraged to continue his HEP and follow-up as scheduled.Pt will benefit from PT services to address deficits in strength, balance, and mobility in order to return to full function at home.                        PT Short Term Goals - 08/22/19 1136      PT SHORT TERM GOAL #1   Title  Pt will be independent with HEP in order to improve strength and balance in  order to decrease fall risk and improve function at home and decrease risk for falls    Time  6    Period  Weeks    Status  New    Target Date  10/04/19        PT Long Term Goals - 08/22/19 1136      PT LONG TERM GOAL #1   Title  Pt will improve BERG by at least 3 points in order to demonstrate clinically significant improvement in balance.     Baseline  08/22/19: 47/56    Time  12    Period  Weeks    Status  New    Target Date  11/14/19      PT LONG TERM GOAL #2   Title  Pt will improve ABC by at least 13% in order to demonstrate clinically significant improvement in balance confidence.    Baseline  08/22/19: 60.6%    Time  12    Period  Weeks    Status  New    Target Date  11/14/19      PT LONG TERM GOAL #3   Title  Pt will be able to perform at least 5 sit to stands from regular height chair without UE assistance    Baseline  08/23/19: Able to perform 2 but limited by weakness and R knee pain    Time  12    Period  Weeks    Status  New    Target Date  11/14/19       PT LONG TERM GOAL #4   Title  Pt will increase self selected gait speed by at least 0.13 m/s in order to demonstrate clinically significant improvement in community ambulation.    Baseline  08/22/19: 11.0s =  0.91 m/s;    Time  12    Period  Weeks    Status  New    Target Date  11/14/19            Plan - 10/03/19 1435    Clinical Impression Statement  Pt demonstrates good motivation during session today. Continued leg press with patient today and increased the resistance again. Also continued with bilateral hip stretching and manual therapy for low back. Continued incorporating balance activities during strengthening. He complains of some L shoulder pain and does demonstrates painful and limited L shoulder AROM. Pt encouraged to obtain order to address his shoulder once he is content ending therapy for his back/balance. Pt encouraged to continue his HEP and follow-up as scheduled. Pt will benefit from PT services to address deficits in strength, balance, and mobility in order to return to full function at home.    Personal Factors and Comorbidities  Comorbidity 3+    Comorbidities  spinal stenosis, COPD, CKD, polymyalgia rheumatica    Examination-Activity Limitations  Bend;Locomotion Level;Transfers;Squat;Stairs    Examination-Participation Restrictions  Community Activity;Yard Work;Cleaning    Stability/Clinical Decision Making  Unstable/Unpredictable    Rehab Potential  Fair    PT Frequency  2x / week    PT Duration  12 weeks    PT Treatment/Interventions  Moist Heat;Manual techniques;Patient/family education;Therapeutic activities;Therapeutic exercise;Stair training;Neuromuscular re-education;Balance training;Scar mobilization;Energy conservation;Electrical Stimulation;Cryotherapy;Biofeedback;Functional mobility training;Taping;ADLs/Self Care Home Management;Aquatic Therapy;Canalith Repostioning;Iontophoresis 4mg /ml Dexamethasone;Traction;Ultrasound;DME Instruction;Gait  training;Passive range of motion;Dry needling;Vestibular;Spinal Manipulations;Joint Manipulations    PT Next Visit Plan  Progress LE strengthening and balance training, address spinal stenosis as able, pt would like to work on floor to stand transfers    PT Home Exercise Plan  [employer] Access Code: [email or web address]    Consulted and Agree with Plan of Care  Patient       Patient will benefit from skilled therapeutic intervention in order to improve the following deficits and impairments:  Abnormal gait, Decreased endurance, Decreased activity tolerance, Decreased strength, Decreased balance, Difficulty walking, Decreased mobility, Cardiopulmonary status limiting activity, Pain  Visit Diagnosis: Muscle weakness (generalized)  Other abnormalities of gait and mobility     Problem List Patient Active Problem List   Diagnosis Date Noted  . Sepsis ([facility]) 03/17/2018     Phillips Grout PT, DPT, GCS  Allaina Brotzman 10/04/2019, 2:40 PM  [place] MAIN [facility] SERVICES [address] [place], [place], [address] Phone: [phone]   Fax:  [phone]  Name: Jordanny Waddington MRN: [id number] Date of Birth: [birth year]

## 2019-10-10 ENCOUNTER — Ambulatory Visit: Payer: Medicare PPO

## 2019-10-10 ENCOUNTER — Other Ambulatory Visit: Payer: Self-pay

## 2019-10-10 DIAGNOSIS — M6281 Muscle weakness (generalized): Secondary | ICD-10-CM | POA: Diagnosis not present

## 2019-10-10 DIAGNOSIS — R2689 Other abnormalities of gait and mobility: Secondary | ICD-10-CM

## 2019-10-10 NOTE — Therapy (Signed)
[facility] MAIN [facility] SERVICES [address] [place], [place], [address] Phone: [phone]   Fax:  [phone]  Physical Therapy Treatment  Patient Details  Name: Caleb Khan MRN: [id number] Date of Birth: Oct 27, 1927 Referring Provider (PT): Dr. Tresa Moore   Encounter Date: 10/10/2019  PT End of Session - 10/10/19 1402    Visit Number  8    Number of Visits  25    Date for PT Re-Evaluation  11/14/19    Authorization Type  Eval 08/22/19    PT Start Time  1347    PT Stop Time  1430    PT Time Calculation (min)  43 min    Equipment Utilized During Treatment  Gait belt    Activity Tolerance  Patient tolerated treatment well    Behavior During Therapy  [facility] for tasks assessed/performed       Past Medical History:  Diagnosis Date  . Anemia   . Anginal pain (HCC)   . Arthritis    osteoarthritis  . Chronic kidney disease    stage 3  . COPD (chronic obstructive pulmonary disease) (HCC)    NO INHALERS  . Coronary artery disease   . Dyspnea    with exertion  . Hypertension   . Polymyalgia rheumatica (HCC)    TAKES PREDNISONE  . Spinal stenosis     Past Surgical History:  Procedure Laterality Date  . CARDIAC CATHETERIZATION Left 05/22/2016   Procedure: Left Heart Cath and Coronary Angiography;  Surgeon: Alwyn Pea, MD;  Location: [facility] INVASIVE CV LAB;  Service: Cardiovascular;  Laterality: Left;  . EYE SURGERY Bilateral    Cataract Extraction with IOL  . GREEN LIGHT LASER TURP (TRANSURETHRAL RESECTION OF PROSTATE N/A 04/26/2018   Procedure: GREEN LIGHT LASER TURP (TRANSURETHRAL RESECTION OF PROSTATE;  Surgeon: Orson Ape, MD;  Location: [facility] ORS;  Service: Urology;  Laterality: N/A;  . INTRAVASCULAR PRESSURE WIRE/FFR STUDY N/A 09/15/2018   Procedure: INTRAVASCULAR PRESSURE WIRE/FFR STUDY;  Surgeon: Alwyn Pea, MD;  Location: [facility] INVASIVE CV LAB;  Service: Cardiovascular;  Laterality: N/A;  . JOINT REPLACEMENT  Right 2004   Shoulder Replacement  . JOINT REPLACEMENT Left 2007   Ankle Replacement  . LEFT HEART CATH AND CORONARY ANGIOGRAPHY Left 09/15/2018   Procedure: LEFT HEART CATH AND CORONARY ANGIOGRAPHY;  Surgeon: Alwyn Pea, MD;  Location: [facility] INVASIVE CV LAB;  Service: Cardiovascular;  Laterality: Left;  . PATELLAR TENDON REPAIR Left 07/15/2017   Procedure: PATELLA TENDON REPAIR;  Surgeon: Kennedy Bucker, MD;  Location: [facility] ORS;  Service: Orthopedics;  Laterality: Left;  Marland Kitchen QUADRICEPS TENDON REPAIR Left 07/15/2017   Procedure: REPAIR QUADRICEP TENDON;  Surgeon: Kennedy Bucker, MD;  Location: [facility] ORS;  Service: Orthopedics;  Laterality: Left;  . TONSILLECTOMY    . VASECTOMY      There were no vitals filed for this visit.  Subjective Assessment - 10/10/19 1401    Subjective  Pt reports that he is doing alright today. He denies any back pain or LE soreness/pain at rest. He is performing HEP. Would like to spend most of his focus on his balance today and moving forward.    Pertinent History  Pt was previously seeing pelvic health PT for urinary incontinence s/p laser TURP procedure October of 2019. With most of his urinary incontinence now resolved he was referred onward due to ongoing LE weakness. Pt reports a history of spinal stenosis with bilateral hip and BLE pain since July of 2019. He  also reports bilateral hip OA. He had an MRI which showed severe spinal stenosis. He was told that he is not a surgical candidate. Pt struggles with his balance and reports approximately 4 falls in the last 6 months. He typically catches his toes resulting in falling forward. He intermittently uses hiking poles for ambulation. He reports DOE with very minimal walking and underwent pulmonary rehab in the early part of 2019 without any improvement in his breathing. Pt would like to improve his leg strength, balance, and endurance with walking. Interval History: 08/22/19: Pt reports that he is not having further  LE pain but has been left with BLE weakness. He reports "a couple falls in the last 6 months." He had a NCV test 6-8 months which showed poor nerve conductivity bilateral LE. He was referred back for therapy for additional LE strengthening.    Limitations  Walking    How long can you walk comfortably?  3/8 mile    Diagnostic tests  Lumbar MRI 03/02/2018: Severe L3-4 stenosis, Mod/severe L4-5 stenosis, Severe L5-S1 stenosis, NCV: BLE nerve conductivity deficits    Patient Stated Goals  Improve leg strength and "decreasing spinal stenosis symptoms"    Currently in Pain?  No/denies             TREATMENT   Manual Therapy FABER stretch x 45s bilateral; FADIR stretch x 45s bilateral; Bilateral HS stretch x 45s on each side;   Neuromuscular Re-education  NuStep L2 x 5 minutes for warm-up (unbilled); WBOS heel/toe rocking x 10 each direction; NBOS eyes open/closed x 30s each; NBOS horizontal/vertical head turns x 30s each; Airex NBOS eyes open/closed x 30s each; Airex NBOS horizontal/vertical head turns x 30s each; Airex ball passes around body with head/eye follow and therapist adjusting position of ball between waist and overhead x 10 on each side; Airex 6" alternating toe taps x 10 each; One foot on Airex and one foot on 6" step static balance x 30s; One foot on Airex and one foot on 6" step horizontal/vertical head turns x 30s; 1/2 foam roll A/P static balance x 30s; 1/2 foam roll A/P heel/toe weight shifting x 10 each; 1/2 foam roll tandem balance alternating forward LE x 30s each;   Pt educated throughout session about proper posture and technique with exercises. Improved exercise technique, movement at target joints, use of target muscles after min to mod verbal, visual, tactile cues.   Pt demonstrates good motivation during session today. Pt states he would like to focus more on balance so session centered around balance exercises today. He continues to demonstrate  difficulty with balance on unstable surfaces such as Airex pad and 1/2 foam roller. Pt encouraged to continue his HEP and follow-up as scheduled.Pt will benefit from PT services to address deficits in strength, balance, and mobility in order to return to full function at home.                      PT Short Term Goals - 08/22/19 1136      PT SHORT TERM GOAL #1   Title  Pt will be independent with HEP in order to improve strength and balance in order to decrease fall risk and improve function at home and decrease risk for falls    Time  6    Period  Weeks    Status  New    Target Date  10/04/19        PT Long Term Goals - 08/22/19  Caleb Khan #1   Title  Pt will improve BERG by at least 3 points in order to demonstrate clinically significant improvement in balance.     Baseline  08/22/19: 47/56    Time  12    Period  Weeks    Status  New    Target Date  11/14/19      PT LONG TERM GOAL #2   Title  Pt will improve ABC by at least 13% in order to demonstrate clinically significant improvement in balance confidence.    Baseline  08/22/19: 60.6%    Time  12    Period  Weeks    Status  New    Target Date  11/14/19      PT LONG TERM GOAL #3   Title  Pt will be able to perform at least 5 sit to stands from regular height chair without UE assistance    Baseline  08/23/19: Able to perform 2 but limited by weakness and R knee pain    Time  12    Period  Weeks    Status  New    Target Date  11/14/19      PT LONG TERM GOAL #4   Title  Pt will increase self selected 10MWT gait speed by at least 0.13 m/s in order to demonstrate clinically significant improvement in community ambulation.    Baseline  08/22/19: 11.0s =  0.91 m/s;    Time  12    Period  Weeks    Status  New    Target Date  11/14/19            Plan - 10/10/19 1531    Clinical Impression Statement  Pt demonstrates good motivation during session today. Pt states he would like to  focus more on balance so session centered around balance exercises today. He continues to demonstrate difficulty with balance on unstable surfaces such as Airex pad and 1/2 foam roller. Pt encouraged to continue his HEP and follow-up as scheduled. Pt will benefit from PT services to address deficits in strength, balance, and mobility in order to return to full function at home.    Personal Factors and Comorbidities  Comorbidity 3+    Comorbidities  spinal stenosis, COPD, CKD, polymyalgia rheumatica    Examination-Activity Limitations  Bend;Locomotion Level;Transfers;Squat;Stairs    Examination-Participation Restrictions  Community Activity;Yard Work;Cleaning    Stability/Clinical Decision Making  Unstable/Unpredictable    Rehab Potential  Fair    PT Frequency  2x / week    PT Duration  12 weeks    PT Treatment/Interventions  Moist Heat;Manual techniques;Patient/family education;Therapeutic activities;Therapeutic exercise;Stair training;Neuromuscular re-education;Balance training;Scar mobilization;Energy conservation;Electrical Stimulation;Cryotherapy;Biofeedback;Functional mobility training;Taping;ADLs/Self Care Home Management;Aquatic Therapy;Canalith Repostioning;Iontophoresis 4mg /ml Dexamethasone;Traction;Ultrasound;DME Instruction;Gait training;Passive range of motion;Dry needling;Vestibular;Spinal Manipulations;Joint Manipulations    PT Next Visit Plan  Progress balance training;    PT Home Exercise Plan  Medbridge Access Code: [email or web address]    Consulted and Agree with Plan of Care  Patient       Patient will benefit from skilled therapeutic intervention in order to improve the following deficits and impairments:  Abnormal gait, Decreased endurance, Decreased activity tolerance, Decreased strength, Decreased balance, Difficulty walking, Decreased mobility, Cardiopulmonary status limiting activity, Pain  Visit Diagnosis: Muscle weakness (generalized)  Other abnormalities of gait and  mobility     Problem List Patient Active Problem List   Diagnosis Date Noted  . Sepsis ([place]) 03/17/2018   Caleb Khan PT,  DPT, GCS  Caleb Khan 10/10/2019, 3:35 PM  [facility] MAIN [facility] SERVICES [address] [place], [place], [address] Phone: [phone]   Fax:  [phone]  Name: Caleb Khan MRN: [id number] Date of Birth: [birth year]

## 2019-10-17 ENCOUNTER — Ambulatory Visit: Payer: Medicare PPO

## 2019-10-17 ENCOUNTER — Other Ambulatory Visit: Payer: Self-pay

## 2019-10-17 VITALS — BP 160/66 | HR 61

## 2019-10-17 DIAGNOSIS — M6281 Muscle weakness (generalized): Secondary | ICD-10-CM

## 2019-10-17 DIAGNOSIS — R2689 Other abnormalities of gait and mobility: Secondary | ICD-10-CM

## 2019-10-17 NOTE — Therapy (Signed)
[facility] MAIN [facility] SERVICES [address] [place], [place], [address] Phone: [phone]   Fax:  [phone]  Physical Therapy Treatment  Patient Details  Name: Caleb Khan MRN: [id number] Date of Birth: 1928-05-01 Referring Provider (PT): Dr. Tresa Moore   Encounter Date: 10/17/2019  PT End of Session - 10/17/19 1340    Visit Number  9    Number of Visits  25    Date for PT Re-Evaluation  11/14/19    Authorization Type  Eval 08/22/19    PT Start Time  1345    PT Stop Time  1430    PT Time Calculation (min)  45 min    Equipment Utilized During Treatment  Gait belt    Activity Tolerance  Patient tolerated treatment well    Behavior During Therapy  [facility] for tasks assessed/performed       Past Medical History:  Diagnosis Date  . Anemia   . Anginal pain (HCC)   . Arthritis    osteoarthritis  . Chronic kidney disease    stage 3  . COPD (chronic obstructive pulmonary disease) (HCC)    NO INHALERS  . Coronary artery disease   . Dyspnea    with exertion  . Hypertension   . Polymyalgia rheumatica (HCC)    TAKES PREDNISONE  . Spinal stenosis     Past Surgical History:  Procedure Laterality Date  . CARDIAC CATHETERIZATION Left 05/22/2016   Procedure: Left Heart Cath and Coronary Angiography;  Surgeon: Alwyn Pea, MD;  Location: [facility] INVASIVE CV LAB;  Service: Cardiovascular;  Laterality: Left;  . EYE SURGERY Bilateral    Cataract Extraction with IOL  . GREEN LIGHT LASER TURP (TRANSURETHRAL RESECTION OF PROSTATE N/A 04/26/2018   Procedure: GREEN LIGHT LASER TURP (TRANSURETHRAL RESECTION OF PROSTATE;  Surgeon: Orson Ape, MD;  Location: [facility] ORS;  Service: Urology;  Laterality: N/A;  . INTRAVASCULAR PRESSURE WIRE/FFR STUDY N/A 09/15/2018   Procedure: INTRAVASCULAR PRESSURE WIRE/FFR STUDY;  Surgeon: Alwyn Pea, MD;  Location: [facility] INVASIVE CV LAB;  Service: Cardiovascular;  Laterality: N/A;  . JOINT REPLACEMENT  Right 2004   Shoulder Replacement  . JOINT REPLACEMENT Left 2007   Ankle Replacement  . LEFT HEART CATH AND CORONARY ANGIOGRAPHY Left 09/15/2018   Procedure: LEFT HEART CATH AND CORONARY ANGIOGRAPHY;  Surgeon: Alwyn Pea, MD;  Location: [facility] INVASIVE CV LAB;  Service: Cardiovascular;  Laterality: Left;  . PATELLAR TENDON REPAIR Left 07/15/2017   Procedure: PATELLA TENDON REPAIR;  Surgeon: Kennedy Bucker, MD;  Location: [facility] ORS;  Service: Orthopedics;  Laterality: Left;  Marland Kitchen QUADRICEPS TENDON REPAIR Left 07/15/2017   Procedure: REPAIR QUADRICEP TENDON;  Surgeon: Kennedy Bucker, MD;  Location: [facility] ORS;  Service: Orthopedics;  Laterality: Left;  . TONSILLECTOMY    . VASECTOMY      Vitals:   10/17/19 1352  BP: (!) 160/66  Pulse: 61  SpO2: 98%    Subjective Assessment - 10/17/19 1338    Subjective  Pt reports that he is doing alright today. He denies any back pain or LE soreness/pain at rest. He is performing HEP. No falls or stumbles since last therapy session. No specific questions or concerns currently.    Pertinent History  Pt was previously seeing pelvic health PT for urinary incontinence s/p laser TURP procedure October of 2019. With most of his urinary incontinence now resolved he was referred onward due to ongoing LE weakness. Pt reports a history of spinal stenosis with bilateral hip  and BLE pain since July of 2019. He also reports bilateral hip OA. He had an MRI which showed severe spinal stenosis. He was told that he is not a surgical candidate. Pt struggles with his balance and reports approximately 4 falls in the last 6 months. He typically catches his toes resulting in falling forward. He intermittently uses hiking poles for ambulation. He reports DOE with very minimal walking and underwent pulmonary rehab in the early part of 2019 without any improvement in his breathing. Pt would like to improve his leg strength, balance, and endurance with walking. Interval History: 08/22/19:  Pt reports that he is not having further LE pain but has been left with BLE weakness. He reports "a couple falls in the last 6 months." He had a NCV test 6-8 months which showed poor nerve conductivity bilateral LE. He was referred back for therapy for additional LE strengthening.    Limitations  Walking    How long can you walk comfortably?  3/8 mile    Diagnostic tests  Lumbar MRI 03/02/2018: Severe L3-4 stenosis, Mod/severe L4-5 stenosis, Severe L5-S1 stenosis, NCV: BLE nerve conductivity deficits    Patient Stated Goals  Improve leg strength and "decreasing spinal stenosis symptoms"    Currently in Pain?  No/denies         TREATMENT   Ther-ex  6" forward step-ups with faded UE support 2-0 x 10 leading with each LE; 6" lateral step-ups with single UE support x 10 each direction; Sit to stand with Airex pad under feet and no UE support x 5, limited by R knee pain; Seated marches with manual resistance from therapist x 15 bilateral; Seated clams with manual resistance from therapist x 15 bilateral; Seated adductor squeeze with manual resistance from therapist x 15 bilateral;   Neuromuscular Re-education  6" alternating forward and lateral step taps without UE support x 10 each direction; 6" orange hurdle half step-over and back alternating leading LE x 10 on each side; 1/2 foam roller full forward/backward step-over x 10 each direction; 1/2 foam roller lateral step-over x 10 each direction; 1/2 foam roll A/P static balance 2 x 30s; 1/2 foam roll A/P heel/toe weight shifting x 10 each; 1/2 foam roll tandem balance alternating forward LE 2 x 30s each; Airex 6" alternating toe taps x 10 each; One foot on Airex and one foot on 6" step static balance x 30s each; One foot on Airex and one foot on 6" step horizontal/vertical head turns x 30s each;   Pt educated throughout session about proper posture and technique with exercises. Improved exercise technique, movement at target  joints, use of target muscles after min to mod verbal, visual, tactile cues.   Pt demonstrates good motivation during session today. Session today focused on balance. He continues to demonstrate difficulty with balance on unstable surfaces such as Airex pad and 1/2 foam roller in addition to single leg balance. Included some light strengthening in session today however it is somewhat limited due to R knee OA.Marland KitchenPt encouraged to continue his HEP and follow-up as scheduled.Pt will benefit from PT services to address deficits in strength, balance, and mobility in order to return to full function at home.                        PT Short Term Goals - 08/22/19 1136      PT SHORT TERM GOAL #1   Title  Pt will be independent with HEP in order  to improve strength and balance in order to decrease fall risk and improve function at home and decrease risk for falls    Time  6    Period  Weeks    Status  New    Target Date  10/04/19        PT Long Term Goals - 08/22/19 1136      PT LONG TERM GOAL #1   Title  Pt will improve BERG by at least 3 points in order to demonstrate clinically significant improvement in balance.     Baseline  08/22/19: 47/56    Time  12    Period  Weeks    Status  New    Target Date  11/14/19      PT LONG TERM GOAL #2   Title  Pt will improve ABC by at least 13% in order to demonstrate clinically significant improvement in balance confidence.    Baseline  08/22/19: 60.6%    Time  12    Period  Weeks    Status  New    Target Date  11/14/19      PT LONG TERM GOAL #3   Title  Pt will be able to perform at least 5 sit to stands from regular height chair without UE assistance    Baseline  08/23/19: Able to perform 2 but limited by weakness and R knee pain    Time  12    Period  Weeks    Status  New    Target Date  11/14/19      PT LONG TERM GOAL #4   Title  Pt will increase self selected gait speed by at least 0.13 m/s in order to  demonstrate clinically significant improvement in community ambulation.    Baseline  08/22/19: 11.0s =  0.91 m/s;    Time  12    Period  Weeks    Status  New    Target Date  11/14/19            Plan - 10/17/19 1340    Clinical Impression Statement  Pt demonstrates good motivation during session today. Session today focused on balance. He continues to demonstrate difficulty with balance on unstable surfaces such as Airex pad and 1/2 foam roller in addition to single leg balance. Included some light strengthening in session today however it is somewhat limited due to R knee OA.Marland Kitchen Pt encouraged to continue his HEP and follow-up as scheduled. Pt will benefit from PT services to address deficits in strength, balance, and mobility in order to return to full function at home.    Personal Factors and Comorbidities  Comorbidity 3+    Comorbidities  spinal stenosis, COPD, CKD, polymyalgia rheumatica    Examination-Activity Limitations  Bend;Locomotion Level;Transfers;Squat;Stairs    Examination-Participation Restrictions  Community Activity;Yard Work;Cleaning    Stability/Clinical Decision Making  Unstable/Unpredictable    Rehab Potential  Fair    PT Frequency  2x / week    PT Duration  12 weeks    PT Treatment/Interventions  Moist Heat;Manual techniques;Patient/family education;Therapeutic activities;Therapeutic exercise;Stair training;Neuromuscular re-education;Balance training;Scar mobilization;Energy conservation;Electrical Stimulation;Cryotherapy;Biofeedback;Functional mobility training;Taping;ADLs/Self Care Home Management;Aquatic Therapy;Canalith Repostioning;Iontophoresis 4mg /ml Dexamethasone;Traction;Ultrasound;DME Instruction;Gait training;Passive range of motion;Dry needling;Vestibular;Spinal Manipulations;Joint Manipulations    PT Next Visit Plan  Progress balance training;    PT Home Exercise Plan  Medbridge Access Code:    Consulted and Agree with Plan of Care  Patient        Patient will benefit from skilled therapeutic  intervention in order to improve the following deficits and impairments:  Abnormal gait, Decreased endurance, Decreased activity tolerance, Decreased strength, Decreased balance, Difficulty walking, Decreased mobility, Cardiopulmonary status limiting activity, Pain  Visit Diagnosis: Muscle weakness (generalized)  Other abnormalities of gait and mobility     Problem List Patient Active Problem List   Diagnosis Date Noted  . Sepsis (HCC) 03/17/2018   Lynnea Maizes PT, DPT, GCS  Charlane Westry 10/17/2019, 2:45 PM  [facility] MAIN [facility] SERVICES [address] [place], [place], [address] Phone: [phone]   Fax:  [phone]  Name: Greene Diodato MRN: [id number] Date of Birth: 1928-05-11

## 2019-10-24 ENCOUNTER — Ambulatory Visit: Payer: Medicare PPO

## 2019-10-24 ENCOUNTER — Other Ambulatory Visit: Payer: Self-pay

## 2019-10-24 DIAGNOSIS — M6281 Muscle weakness (generalized): Secondary | ICD-10-CM | POA: Diagnosis not present

## 2019-10-24 DIAGNOSIS — R2689 Other abnormalities of gait and mobility: Secondary | ICD-10-CM

## 2019-10-24 NOTE — Patient Instructions (Signed)
Access Code: [email or web address] URL: [email or web address] Date: 10/24/2019 Prepared by: Ria Comment  Exercises Supine Bridge - 1 x daily - 7 x weekly - 10 reps - 2 sets - 3 seconds hold Seated March with Resistance - 1 x daily - 7 x weekly - 10 reps - 2 sets - 3 seconds hold Mini Squat with Counter Support - 1 x daily - 7 x weekly - 10 reps - 2 sets Seated Hip Abduction - 1 x daily - 7 x weekly - 10 reps - 2 sets - 3 seconds hold Seated Figure 4 Piriformis Stretch - 1 x daily - 7 x weekly - 3 reps - 30-45 seconds on each side hold Standing Tandem Balance with Counter Support - 1 x daily - 7 x weekly - 30s x 3 with each foot forward hold Standing Single Leg Stance with Counter Support - 1 x daily - 7 x weekly - 30s x 3 on each foot hold Heel Toe Raises with Counter Support - 1 x daily - 7 x weekly - 2 sets - 10 reps - 3s hold

## 2019-10-24 NOTE — Therapy (Signed)
[place] MAIN [facility] SERVICES [address] [place], [place], [address] Phone: [phone]   Fax:  [phone]  Physical Therapy Progress Note/Discharge   Dates of reporting period  08/22/19   to   10/24/19  Patient Details  Name: Caleb Khan MRN: [id number] Date of Birth: 08-28-1927 Referring Provider (PT): Dr. Annalee Genta   Encounter Date: 10/24/2019  PT End of Session - 10/24/19 1343    Visit Number  10    Number of Visits  25    Date for PT Re-Evaluation  11/14/19    Authorization Type  Eval 08/22/19    PT Start Time  1345    PT Stop Time  1430    PT Time Calculation (min)  45 min    Equipment Utilized During Treatment  Gait belt    Activity Tolerance  Patient tolerated treatment well    Behavior During Therapy  [facility] for tasks assessed/performed       Past Medical History:  Diagnosis Date  . Anemia   . Anginal pain ([place])   . Arthritis    osteoarthritis  . Chronic kidney disease    stage 3  . COPD (chronic obstructive pulmonary disease) ([place])    NO INHALERS  . Coronary artery disease   . Dyspnea    with exertion  . Hypertension   . Polymyalgia rheumatica ([facility])    TAKES PREDNISONE  . Spinal stenosis     Past Surgical History:  Procedure Laterality Date  . CARDIAC CATHETERIZATION Left 05/22/2016   Procedure: Left Heart Cath and Coronary Angiography;  Surgeon: Yolonda Kida, MD;  Location: [place] [facility];  Service: Cardiovascular;  Laterality: Left;  . EYE SURGERY Bilateral    Cataract Extraction with IOL  . GREEN LIGHT LASER TURP (TRANSURETHRAL RESECTION OF PROSTATE N/A 04/26/2018   Procedure: GREEN LIGHT LASER TURP (TRANSURETHRAL RESECTION OF PROSTATE;  Surgeon: Royston Cowper, MD;  Location: [facility];  Service: Urology;  Laterality: N/A;  . INTRAVASCULAR PRESSURE WIRE/FFR STUDY N/A 09/15/2018   Procedure: INTRAVASCULAR PRESSURE WIRE/FFR STUDY;  Surgeon: Yolonda Kida, MD;  Location: [place] [facility];   Service: Cardiovascular;  Laterality: N/A;  . JOINT REPLACEMENT Right 2004   Shoulder Replacement  . JOINT REPLACEMENT Left 2007   Ankle Replacement  . LEFT HEART CATH AND CORONARY ANGIOGRAPHY Left 09/15/2018   Procedure: LEFT HEART CATH AND CORONARY ANGIOGRAPHY;  Surgeon: Yolonda Kida, MD;  Location: [place] [facility];  Service: Cardiovascular;  Laterality: Left;  . PATELLAR TENDON REPAIR Left 07/15/2017   Procedure: PATELLA TENDON REPAIR;  Surgeon: Hessie Knows, MD;  Location: [facility];  Service: Orthopedics;  Laterality: Left;  Marland Kitchen QUADRICEPS TENDON REPAIR Left 07/15/2017   Procedure: REPAIR QUADRICEP TENDON;  Surgeon: Hessie Knows, MD;  Location: [facility];  Service: Orthopedics;  Laterality: Left;  . TONSILLECTOMY    . VASECTOMY      There were no vitals filed for this visit.  Subjective Assessment - 10/24/19 1342    Subjective  Pt reports that he is doing alright today. He denies any back pain or LE soreness/pain at rest. He is performing HEP. No falls or stumbles since last therapy session. Pt feels like he is ready to end therapy. He doesn't feel like it has been helping.    Pertinent History  Pt was previously seeing pelvic health PT for urinary incontinence s/p laser TURP procedure October of 2019. With most of his urinary incontinence now resolved he was  referred onward due to ongoing LE weakness. Pt reports a history of spinal stenosis with bilateral hip and BLE pain since July of 2019. He also reports bilateral hip OA. He had an MRI which showed severe spinal stenosis. He was told that he is not a surgical candidate. Pt struggles with his balance and reports approximately 4 falls in the last 6 months. He typically catches his toes resulting in falling forward. He intermittently uses hiking poles for ambulation. He reports DOE with very minimal walking and underwent pulmonary rehab in the early part of 2019 without any improvement in his breathing. Pt would like to improve his  leg strength, balance, and endurance with walking. Interval History: 08/22/19: Pt reports that he is not having further LE pain but has been left with BLE weakness. He reports "a couple falls in the last 6 months." He had a NCV test 6-8 months which showed poor nerve conductivity bilateral LE. He was referred back for therapy for additional LE strengthening.    Limitations  Walking    How long can you walk comfortably?  3/8 mile    Diagnostic tests  Lumbar MRI 03/02/2018: Severe L3-4 stenosis, Mod/severe L4-5 stenosis, Severe L5-S1 stenosis, NCV: BLE nerve conductivity deficits    Patient Stated Goals  Improve leg strength and "decreasing spinal stenosis symptoms"    Currently in Pain?  No/denies         [facility] PT Assessment - 10/24/19 1406      Berg Balance Test   Sit to Stand  Able to stand without using hands and stabilize independently    Standing Unsupported  Able to stand safely 2 minutes    Sitting with Back Unsupported but Feet Supported on Floor or Stool  Able to sit safely and securely 2 minutes    Stand to Sit  Controls descent by using hands    Transfers  Able to transfer safely, definite need of hands    Standing Unsupported with Eyes Closed  Able to stand 10 seconds safely    Standing Unsupported with Feet Together  Able to place feet together independently and stand 1 minute safely    From Standing, Reach Forward with Outstretched Arm  Can reach forward >12 cm safely (5")    From Standing Position, Pick up Object from Floor  Able to pick up shoe, needs supervision    From Standing Position, Turn to Look Behind Over each Shoulder  Looks behind from both sides and weight shifts well    Turn 360 Degrees  Able to turn 360 degrees safely but slowly    Standing Unsupported, Alternately Place Feet on Step/Stool  Able to stand independently and safely and complete 8 steps in 20 seconds    Standing Unsupported, One Foot in Front  Able to plae foot ahead of the other independently and hold  30 seconds    Standing on One Leg  Tries to lift leg/unable to hold 3 seconds but remains standing independently    Total Score  46        TREATMENT   Neuromuscular Re-education   Updated outcome measures and goals with patient: FOTO: 52 (47 at initial evaluation);   FUNCTIONAL OUTCOME MEASURES     Results Comments  BERG 46/56 (47/56) Fall risk, in need of intervention  30s Sit to Stand Test 3 reps (2 reps) Considerable fatigue and weakness noted, fall risk  5TSTS Able to perform 3 reps without hands, (Able to perform 2 reps without hands) Above normative  values, fall risk. Discontinued due to R knee pain, minA+1 for last repetition  10 Meter Gait Speed Self-selected:10.2s =0.98 m/s; (11.0s =0.91 m/s) Withinnormative values for full community ambulation  ABC Scale 55.6% (60.6%)  Below cut-off, increased fall risk      Pt provided modified HEP to include additional balance exercises; Tandem balance alternating forward LE without UE support x 30s each; Heel/toe raises 3s hold x 10 each, pt unable to lift toes due to foot drop but able to shift weight onto heels;   Pt demonstrates good motivation during session today. He reports that he is ready to discharge today as he does not believe that physical therapy has made a notable difference in his strength or balance. Outcome measures and goals were updated today. No significant change noted with the exception of slight increase in his self-selected walking speed and slight decline in his overall balance confidence. Pt was provided modified home exercise program to continue at home for LE strength and balance. He plans to return to the fitness center at [facility] when it reopens. Pt will be discharged on this date due to patient preference.                      PT Short Term Goals - 10/24/19 1352      PT SHORT TERM GOAL #1   Title  Pt will be independent with HEP in order to improve strength and  balance in order to decrease fall risk and improve function at home and decrease risk for falls    Time  6    Period  Weeks    Status  On-going    Target Date  10/04/19        PT Long Term Goals - 10/24/19 1352      PT LONG TERM GOAL #1   Title  Pt will improve BERG by at least 3 points in order to demonstrate clinically significant improvement in balance.     Baseline  08/22/19: 47/56; 10/24/19: 46/56    Time  12    Period  Weeks    Status  Not Met    Target Date  --      PT LONG TERM GOAL #2   Title  Pt will improve ABC by at least 13% in order to demonstrate clinically significant improvement in balance confidence.    Baseline  08/22/19: 60.6%; 10/24/19: 55.6%    Time  12    Period  Weeks    Status  Not Met    Target Date  --      PT LONG TERM GOAL #3   Title  Pt will be able to perform at least 5 sit to stands from regular height chair without UE assistance    Baseline  08/23/19: Able to perform 2 but limited by weakness and R knee pain; 10/24/19: Able to perform 3 with minA+1 during last rep, limited by R knee pain    Time  12    Period  Weeks    Status  Not Met    Target Date  --      PT LONG TERM GOAL #4   Title  Pt will increase self selected 10MWT gait speed by at least 0.13 m/s in order to demonstrate clinically significant improvement in community ambulation.    Baseline  08/22/19: 11.0s =  0.91 m/s; 10/24/19: 10.2s = 0.98 m/s    Time  12    Period  Weeks  Status  Partially Met    Target Date  --            Plan - 10/24/19 1343    Clinical Impression Statement  Pt demonstrates good motivation during session today. He reports that he is ready to discharge today as he does not believe that physical therapy has made a notable difference in his strength or balance. Outcome measures and goals were updated today. No significant change noted with the exception of slight increase in his self-selected walking speed and slight decline in his overall balance confidence.  Pt was provided modified home exercise program to continue at home for LE strength and balance. He plans to return to the fitness center at [facility] when it reopens. Pt will be discharged on this date due to patient preference.    Personal Factors and Comorbidities  Comorbidity 3+    Comorbidities  spinal stenosis, COPD, CKD, polymyalgia rheumatica    Examination-Activity Limitations  Bend;Locomotion Level;Transfers;Squat;Stairs    Examination-Participation Restrictions  Community Activity;Yard Work;Cleaning    Stability/Clinical Decision Making  Unstable/Unpredictable    Rehab Potential  Fair    PT Frequency  2x / week    PT Duration  12 weeks    PT Treatment/Interventions  Moist Heat;Manual techniques;Patient/family education;Therapeutic activities;Therapeutic exercise;Stair training;Neuromuscular re-education;Balance training;Scar mobilization;Energy conservation;Electrical Stimulation;Cryotherapy;Biofeedback;Functional mobility training;Taping;ADLs/Self Care Home Management;Aquatic Therapy;Canalith Repostioning;Iontophoresis '4mg'$ /ml Dexamethasone;Traction;Ultrasound;DME Instruction;Gait training;Passive range of motion;Dry needling;Vestibular;Spinal Manipulations;Joint Manipulations    PT Next Visit Plan  Discharge today    PT Home Exercise Plan  Medbridge Access Code: [email or web address]    Consulted and Agree with Plan of Care  Patient       Patient will benefit from skilled therapeutic intervention in order to improve the following deficits and impairments:  Abnormal gait, Decreased endurance, Decreased activity tolerance, Decreased strength, Decreased balance, Difficulty walking, Decreased mobility, Cardiopulmonary status limiting activity, Pain  Visit Diagnosis: Muscle weakness (generalized)  Other abnormalities of gait and mobility     Problem List Patient Active Problem List   Diagnosis Date Noted  . Sepsis ([place]) 03/17/2018   Phillips Grout PT, DPT, GCS  Karmine Kauer 10/24/2019,  5:16 PM  [place] [facility]IN [facility] SERVICES [address] [place], [place], [address] Phone: [phone]   Fax:  [phone]  Name: Jettson Crable MRN: [id number] Date of Birth: Apr 07, 1928

## 2019-10-30 ENCOUNTER — Ambulatory Visit: Payer: Medicare PPO

## 2019-11-07 ENCOUNTER — Ambulatory Visit: Payer: Medicare PPO

## 2019-11-14 ENCOUNTER — Ambulatory Visit: Payer: Medicare PPO

## 2019-11-21 ENCOUNTER — Ambulatory Visit: Payer: Medicare PPO

## 2020-03-14 ENCOUNTER — Other Ambulatory Visit: Payer: Self-pay | Admitting: Urology

## 2020-03-14 DIAGNOSIS — R351 Nocturia: Secondary | ICD-10-CM

## 2020-03-26 ENCOUNTER — Ambulatory Visit: Payer: Medicare PPO

## 2020-04-03 ENCOUNTER — Other Ambulatory Visit: Payer: Self-pay

## 2020-04-03 ENCOUNTER — Ambulatory Visit
Admission: RE | Admit: 2020-04-03 | Discharge: 2020-04-03 | Disposition: A | Discharge status: Home / Self Care | Payer: Medicare PPO | Source: Ambulatory Visit | Attending: Urology | Admitting: Urology

## 2020-04-03 DIAGNOSIS — I700 Atherosclerosis of aorta: Secondary | ICD-10-CM | POA: Insufficient documentation

## 2020-04-03 DIAGNOSIS — N400 Enlarged prostate without lower urinary tract symptoms: Secondary | ICD-10-CM | POA: Diagnosis not present

## 2020-04-03 DIAGNOSIS — R351 Nocturia: Secondary | ICD-10-CM | POA: Diagnosis present

## 2020-04-03 DIAGNOSIS — M5489 Other dorsalgia: Secondary | ICD-10-CM | POA: Insufficient documentation

## 2021-01-15 ENCOUNTER — Other Ambulatory Visit: Payer: Self-pay | Admitting: Orthopedic Surgery

## 2021-01-15 DIAGNOSIS — M25511 Pain in right shoulder: Secondary | ICD-10-CM

## 2021-01-22 ENCOUNTER — Other Ambulatory Visit: Payer: Self-pay

## 2021-01-22 ENCOUNTER — Emergency Department: Payer: Medicare PPO

## 2021-01-22 ENCOUNTER — Observation Stay
Admission: EM | Admit: 2021-01-22 | Discharge: 2021-01-23 | Disposition: A | Discharge status: Home / Self Care | Payer: Medicare PPO | Attending: Internal Medicine | Admitting: Internal Medicine

## 2021-01-22 DIAGNOSIS — I129 Hypertensive chronic kidney disease with stage 1 through stage 4 chronic kidney disease, or unspecified chronic kidney disease: Secondary | ICD-10-CM | POA: Diagnosis not present

## 2021-01-22 DIAGNOSIS — J449 Chronic obstructive pulmonary disease, unspecified: Secondary | ICD-10-CM | POA: Insufficient documentation

## 2021-01-22 DIAGNOSIS — I2510 Atherosclerotic heart disease of native coronary artery without angina pectoris: Secondary | ICD-10-CM | POA: Insufficient documentation

## 2021-01-22 DIAGNOSIS — R079 Chest pain, unspecified: Secondary | ICD-10-CM | POA: Diagnosis not present

## 2021-01-22 DIAGNOSIS — R0602 Shortness of breath: Secondary | ICD-10-CM | POA: Diagnosis not present

## 2021-01-22 DIAGNOSIS — Z7982 Long term (current) use of aspirin: Secondary | ICD-10-CM | POA: Insufficient documentation

## 2021-01-22 DIAGNOSIS — Z87891 Personal history of nicotine dependence: Secondary | ICD-10-CM | POA: Insufficient documentation

## 2021-01-22 DIAGNOSIS — I10 Essential (primary) hypertension: Secondary | ICD-10-CM

## 2021-01-22 DIAGNOSIS — N1832 Chronic kidney disease, stage 3b: Secondary | ICD-10-CM | POA: Diagnosis not present

## 2021-01-22 DIAGNOSIS — Z96662 Presence of left artificial ankle joint: Secondary | ICD-10-CM | POA: Diagnosis not present

## 2021-01-22 DIAGNOSIS — Z20822 Contact with and (suspected) exposure to covid-19: Secondary | ICD-10-CM | POA: Insufficient documentation

## 2021-01-22 DIAGNOSIS — M353 Polymyalgia rheumatica: Secondary | ICD-10-CM

## 2021-01-22 DIAGNOSIS — Z96611 Presence of right artificial shoulder joint: Secondary | ICD-10-CM | POA: Insufficient documentation

## 2021-01-22 DIAGNOSIS — Z79899 Other long term (current) drug therapy: Secondary | ICD-10-CM | POA: Diagnosis not present

## 2021-01-22 DIAGNOSIS — N183 Chronic kidney disease, stage 3 unspecified: Secondary | ICD-10-CM | POA: Diagnosis present

## 2021-01-22 DIAGNOSIS — I4891 Unspecified atrial fibrillation: Secondary | ICD-10-CM | POA: Insufficient documentation

## 2021-01-22 DIAGNOSIS — Z66 Do not resuscitate: Secondary | ICD-10-CM

## 2021-01-22 LAB — CBC WITH DIFFERENTIAL/PLATELET
Abs Immature Granulocytes: 0.02 10*3/uL (ref 0.00–0.07)
Basophils Absolute: 0.1 10*3/uL (ref 0.0–0.1)
Basophils Relative: 1 %
Eosinophils Absolute: 0.1 10*3/uL (ref 0.0–0.5)
Eosinophils Relative: 1 %
HCT: 44.7 % (ref 39.0–52.0)
Hemoglobin: 15.3 g/dL (ref 13.0–17.0)
Immature Granulocytes: 0 %
Lymphocytes Relative: 32 %
Lymphs Abs: 2.4 10*3/uL (ref 0.7–4.0)
MCH: 32.3 pg (ref 26.0–34.0)
MCHC: 34.2 g/dL (ref 30.0–36.0)
MCV: 94.5 fL (ref 80.0–100.0)
Monocytes Absolute: 0.6 10*3/uL (ref 0.1–1.0)
Monocytes Relative: 8 %
Neutro Abs: 4.5 10*3/uL (ref 1.7–7.7)
Neutrophils Relative %: 58 %
Platelets: 194 10*3/uL (ref 150–400)
RBC: 4.73 MIL/uL (ref 4.22–5.81)
RDW: 14.6 % (ref 11.5–15.5)
WBC: 7.7 10*3/uL (ref 4.0–10.5)
nRBC: 0 % (ref 0.0–0.2)

## 2021-01-22 LAB — COMPREHENSIVE METABOLIC PANEL
ALT: 10 U/L (ref 0–44)
AST: 5 U/L — ABNORMAL LOW (ref 15–41)
Albumin: 3.8 g/dL (ref 3.5–5.0)
Alkaline Phosphatase: 58 U/L (ref 38–126)
Anion gap: 9 (ref 5–15)
BUN: 30 mg/dL — ABNORMAL HIGH (ref 8–23)
CO2: 25 mmol/L (ref 22–32)
Calcium: 9.1 mg/dL (ref 8.9–10.3)
Chloride: 104 mmol/L (ref 98–111)
Creatinine, Ser: 1.26 mg/dL — ABNORMAL HIGH (ref 0.61–1.24)
GFR, Estimated: 54 mL/min — ABNORMAL LOW (ref >60)
Glucose, Bld: 85 mg/dL (ref 70–99)
Potassium: 4.7 mmol/L (ref 3.5–5.1)
Sodium: 138 mmol/L (ref 135–145)
Total Bilirubin: 1.6 mg/dL — ABNORMAL HIGH (ref 0.3–1.2)
Total Protein: 7.1 g/dL (ref 6.5–8.1)

## 2021-01-22 LAB — PROTIME-INR
INR: 0.9 (ref 0.8–1.2)
INR: 1.1 (ref 0.8–1.2)
Prothrombin Time: 12.4 seconds (ref 11.4–15.2)
Prothrombin Time: 13.7 seconds (ref 11.4–15.2)

## 2021-01-22 LAB — RESP PANEL BY RT-PCR (FLU A&B, COVID) ARPGX2
Influenza A by PCR: NEGATIVE
Influenza B by PCR: NEGATIVE
SARS Coronavirus 2 by RT PCR: NEGATIVE

## 2021-01-22 LAB — BRAIN NATRIURETIC PEPTIDE: B Natriuretic Peptide: 423.2 pg/mL — ABNORMAL HIGH (ref 0.0–100.0)

## 2021-01-22 LAB — LIPASE, BLOOD: Lipase: 34 U/L (ref 11–51)

## 2021-01-22 LAB — APTT
aPTT: 20 seconds — ABNORMAL LOW (ref 24–36)
aPTT: 132 seconds — ABNORMAL HIGH (ref 24–36)

## 2021-01-22 LAB — TROPONIN I (HIGH SENSITIVITY)
Troponin I (High Sensitivity): 8 ng/L (ref <18)
Troponin I (High Sensitivity): 12 ng/L (ref <18)
Troponin I (High Sensitivity): 18 ng/L — ABNORMAL HIGH (ref <18)
Troponin I (High Sensitivity): 22 ng/L — ABNORMAL HIGH (ref <18)
Troponin I (High Sensitivity): 23 ng/L — ABNORMAL HIGH (ref <18)

## 2021-01-22 LAB — HEPARIN LEVEL (UNFRACTIONATED): Heparin Unfractionated: 0.6 IU/mL (ref 0.30–0.70)

## 2021-01-22 LAB — MAGNESIUM: Magnesium: 2.2 mg/dL (ref 1.7–2.4)

## 2021-01-22 MED ORDER — HEPARIN (PORCINE) 25000 UT/250ML-% IV SOLN
900.0000 [IU]/h | INTRAVENOUS | Status: DC
  Administered 2021-01-22: 900 [IU]/h via INTRAVENOUS
  Filled 2021-01-22: qty 250

## 2021-01-22 MED ORDER — OCUVITE-LUTEIN PO CAPS
1.0000 | ORAL_CAPSULE | Freq: Every day | ORAL | Status: DC
  Administered 2021-01-22: 1 via ORAL
  Filled 2021-01-22 (×3): qty 1

## 2021-01-22 MED ORDER — GABAPENTIN 100 MG PO CAPS
100.0000 mg | ORAL_CAPSULE | Freq: Every day | ORAL | Status: DC
  Administered 2021-01-22: 100 mg via ORAL
  Filled 2021-01-22 (×2): qty 1

## 2021-01-22 MED ORDER — ADULT MULTIVITAMIN W/MINERALS CH
ORAL_TABLET | Freq: Every day | ORAL | Status: DC
  Administered 2021-01-22 – 2021-01-23 (×2): 1 via ORAL
  Filled 2021-01-22 (×2): qty 1

## 2021-01-22 MED ORDER — APIXABAN 5 MG PO TABS
5.0000 mg | ORAL_TABLET | Freq: Two times a day (BID) | ORAL | Status: DC
  Administered 2021-01-22 – 2021-01-23 (×2): 5 mg via ORAL
  Filled 2021-01-22 (×2): qty 1

## 2021-01-22 MED ORDER — PREDNISONE 10 MG PO TABS
15.0000 mg | ORAL_TABLET | Freq: Every day | ORAL | Status: DC
  Administered 2021-01-23: 15 mg via ORAL
  Filled 2021-01-22: qty 2

## 2021-01-22 MED ORDER — HYDROCORTISONE NA SUCCINATE PF 100 MG IJ SOLR
50.0000 mg | Freq: Once | INTRAMUSCULAR | Status: AC
  Administered 2021-01-22: 50 mg via INTRAVENOUS
  Filled 2021-01-22: qty 2

## 2021-01-22 MED ORDER — NITROGLYCERIN 0.4 MG SL SUBL: 0.4000 mg | SUBLINGUAL_TABLET | SUBLINGUAL | Status: DC | PRN

## 2021-01-22 MED ORDER — ASPIRIN EC 81 MG PO TBEC
81.0000 mg | DELAYED_RELEASE_TABLET | Freq: Every day | ORAL | Status: DC
  Administered 2021-01-23: 81 mg via ORAL
  Filled 2021-01-22 (×2): qty 1

## 2021-01-22 MED ORDER — ACETAMINOPHEN 325 MG PO TABS: 650.0000 mg | ORAL_TABLET | Freq: Four times a day (QID) | ORAL | Status: DC | PRN

## 2021-01-22 MED ORDER — CALCIUM CARBONATE-VITAMIN D 500-200 MG-UNIT PO TABS
1.0000 | ORAL_TABLET | Freq: Every day | ORAL | Status: DC
  Administered 2021-01-23: 1 via ORAL
  Filled 2021-01-22 (×2): qty 1

## 2021-01-22 MED ORDER — HYDROXYZINE HCL 25 MG PO TABS: 25.0000 mg | ORAL_TABLET | Freq: Three times a day (TID) | ORAL | Status: DC | PRN

## 2021-01-22 MED ORDER — HYDRALAZINE HCL 20 MG/ML IJ SOLN
5.0000 mg | INTRAMUSCULAR | Status: DC | PRN
  Administered 2021-01-23: 5 mg via INTRAVENOUS
  Filled 2021-01-22: qty 1

## 2021-01-22 MED ORDER — DOCUSATE SODIUM 100 MG PO CAPS
100.0000 mg | ORAL_CAPSULE | Freq: Every evening | ORAL | Status: DC
  Administered 2021-01-22: 100 mg via ORAL
  Filled 2021-01-22: qty 1

## 2021-01-22 MED ORDER — MORPHINE SULFATE (PF) 2 MG/ML IV SOLN: 0.5000 mg | INTRAVENOUS | Status: DC | PRN

## 2021-01-22 MED ORDER — AMIODARONE HCL 200 MG PO TABS
200.0000 mg | ORAL_TABLET | Freq: Two times a day (BID) | ORAL | Status: DC
  Administered 2021-01-22 – 2021-01-23 (×2): 200 mg via ORAL
  Filled 2021-01-22 (×2): qty 1

## 2021-01-22 MED ORDER — HEPARIN BOLUS VIA INFUSION
3000.0000 [IU] | Freq: Once | INTRAVENOUS | Status: AC
  Administered 2021-01-22: 3000 [IU] via INTRAVENOUS
  Filled 2021-01-22: qty 3000

## 2021-01-22 MED ORDER — DM-GUAIFENESIN ER 30-600 MG PO TB12
1.0000 | ORAL_TABLET | Freq: Two times a day (BID) | ORAL | Status: DC
  Administered 2021-01-23: 1 via ORAL
  Filled 2021-01-22: qty 1

## 2021-01-22 MED ORDER — LOSARTAN POTASSIUM 50 MG PO TABS
50.0000 mg | ORAL_TABLET | Freq: Every day | ORAL | Status: DC
  Administered 2021-01-22 – 2021-01-23 (×2): 50 mg via ORAL
  Filled 2021-01-22 (×2): qty 1

## 2021-01-22 MED ORDER — DILTIAZEM HCL 60 MG PO TABS
30.0000 mg | ORAL_TABLET | Freq: Once | ORAL | Status: AC
  Administered 2021-01-22: 30 mg via ORAL
  Filled 2021-01-22: qty 1

## 2021-01-22 MED ORDER — PSYLLIUM 95 % PO PACK
1.0000 | PACK | Freq: Every day | ORAL | Status: DC
  Filled 2021-01-22 (×4): qty 1

## 2021-01-22 MED ORDER — AMLODIPINE BESYLATE 10 MG PO TABS
10.0000 mg | ORAL_TABLET | Freq: Every day | ORAL | Status: DC
  Administered 2021-01-22 – 2021-01-23 (×2): 10 mg via ORAL
  Filled 2021-01-22: qty 1
  Filled 2021-01-22: qty 2

## 2021-01-22 MED ORDER — ZOLPIDEM TARTRATE 5 MG PO TABS: 5.0000 mg | ORAL_TABLET | Freq: Every evening | ORAL | Status: DC | PRN

## 2021-01-22 MED ORDER — ISOSORBIDE MONONITRATE ER 30 MG PO TB24
30.0000 mg | ORAL_TABLET | Freq: Every day | ORAL | Status: DC
  Administered 2021-01-22 – 2021-01-23 (×2): 30 mg via ORAL
  Filled 2021-01-22 (×2): qty 1

## 2021-01-22 MED ORDER — DILTIAZEM HCL 25 MG/5ML IV SOLN
10.0000 mg | INTRAVENOUS | Status: AC
  Administered 2021-01-22: 10 mg via INTRAVENOUS
  Filled 2021-01-22: qty 5

## 2021-01-22 MED ORDER — ALBUTEROL SULFATE (2.5 MG/3ML) 0.083% IN NEBU: 2.5000 mg | INHALATION_SOLUTION | RESPIRATORY_TRACT | Status: DC | PRN

## 2021-01-22 NOTE — ED Notes (Signed)
Pt provided warm blanket as requested 

## 2021-01-22 NOTE — ED Triage Notes (Signed)
To ED via [facility] from home for CP that awoke him around 5AM. Given 324 ASA and 1 spray nitro. No hx of a-fib. Pt currently in a-fib but denies CP currently. 18G L forearm

## 2021-01-22 NOTE — H&P (Signed)
History and Physical    Caleb Khan QTM:[id number] DOB: Feb 07, 1928 DOA: 01/22/2021  Referring MD/NP/PA:   PCP: Marisue Ivan, MD   Patient coming from:  The patient is coming from home.  At baseline, pt is independent for most of ADL.        Chief Complaint: chest pain  HPI: Caleb Khan is a 87 y.o. male with medical history significant of HTN, COPD, PMR on chronic prednisone, CAD, CKD-IIIb, spinal stenosis, who presents with chest pain.   Patient states that his chest pain started at about 5:00 AM which woke him from sleep. It is located in the left side of chest, moderate to severe, pressure like, radiating to the left arm.  Denies shortness breath, cough, fever or chills.  Patient does not have nausea, vomiting, diarrhea or abdominal pain.  No symptoms of UTI. When EMS arrived, pt was found in A. fib with heart rate of about 140. They provided a full dose 324 mg of aspirin and 1 nitroglycerin. His chest pain has subsided. When I saw pt in ED, he is chest pain-free.  Patient was given 10 mg of IV Cardizem and 30 mg of oral Cardizem.  His heart rate improved to 100s in ED,   ED Course: pt was found to have trop 8 -->12, BNP 423, INR 0.9, PTT 20, pending COVID-19 PCR, renal function at baseline, lipase 34, temperature normal, blood pressure 139/74, RR 19, oxygen saturation 96% on room air, chest x-ray negative.  Patient is placed on progressive bed for observation, Dr. Juliann Pares of cardiology is consulted  Review of Systems:   General: no fevers, chills, no body weight gain, has fatigue HEENT: no blurry vision, hearing changes or sore throat Respiratory: no dyspnea, coughing, wheezing CV: has chest pain, no palpitations GI: no nausea, vomiting, abdominal pain, diarrhea, constipation GU: no dysuria, burning on urination, increased urinary frequency, hematuria  Ext: no leg edema Neuro: no unilateral weakness, numbness, or tingling, no vision change or hearing loss Skin: no rash,  no skin tear. MSK: No muscle spasm, no deformity, no limitation of range of movement in spin Heme: No easy bruising.  Travel history: No recent long distant travel.  Allergy:  Allergies  Allergen Reactions   Benazepril Cough    Past Medical History:  Diagnosis Date   Anemia    Anginal pain (HCC)    Arthritis    osteoarthritis   Chronic kidney disease    stage 3   COPD (chronic obstructive pulmonary disease) (HCC)    NO INHALERS   Coronary artery disease    Dyspnea    with exertion   Hypertension    Polymyalgia rheumatica (HCC)    TAKES PREDNISONE   Spinal stenosis     Past Surgical History:  Procedure Laterality Date   CARDIAC CATHETERIZATION Left 05/22/2016   Procedure: Left Heart Cath and Coronary Angiography;  Surgeon: Alwyn Pea, MD;  Location: [facility];  Service: Cardiovascular;  Laterality: Left;   EYE SURGERY Bilateral    Cataract Extraction with IOL   GREEN LIGHT LASER TURP (TRANSURETHRAL RESECTION OF PROSTATE N/A 04/26/2018   Procedure: GREEN LIGHT LASER TURP (TRANSURETHRAL RESECTION OF PROSTATE;  Surgeon: Orson Ape, MD;  Location: [facility];  Service: Urology;  Laterality: N/A;   INTRAVASCULAR PRESSURE WIRE/FFR STUDY N/A 09/15/2018   Procedure: INTRAVASCULAR PRESSURE WIRE/FFR STUDY;  Surgeon: Alwyn Pea, MD;  Location: [facility];  Service: Cardiovascular;  Laterality: N/A;   JOINT REPLACEMENT Right 2004  Shoulder Replacement   JOINT REPLACEMENT Left 2007   Ankle Replacement   LEFT HEART CATH AND CORONARY ANGIOGRAPHY Left 09/15/2018   Procedure: LEFT HEART CATH AND CORONARY ANGIOGRAPHY;  Surgeon: Alwyn Peaallwood, Dwayne D, MD;  Location: [facility];  Service: Cardiovascular;  Laterality: Left;   PATELLAR TENDON REPAIR Left 07/15/2017   Procedure: PATELLA TENDON REPAIR;  Surgeon: Kennedy BuckerMenz, Michael, MD;  Location: [facility];  Service: Orthopedics;  Laterality: Left;   QUADRICEPS TENDON REPAIR Left 07/15/2017   Procedure:  REPAIR QUADRICEP TENDON;  Surgeon: Kennedy BuckerMenz, Michael, MD;  Location: [facility];  Service: Orthopedics;  Laterality: Left;   TONSILLECTOMY     VASECTOMY      Social History:  reports that he quit smoking about 48 years ago. His smoking use included cigarettes. He has a 44.00 pack-year smoking history. He has never used smokeless tobacco. He reports current alcohol use of about 7.0 standard drinks of alcohol per week. He reports that he does not use drugs.  Family History:  Family History  Problem Relation Age of Onset   Hypertension Mother      Prior to Admission medications   Medication Sig Start Date End Date Taking? Authorizing Provider  acetaminophen (TYLENOL) 500 MG tablet Take 1,000 mg by mouth every 8 (eight) hours as needed (pain).    [provider]  amLODipine (NORVASC) 5 MG tablet Take 5 mg by mouth every morning.     [provider]  ANORO ELLIPTA 62.5-25 MCG/INH AEPB Inhale 1 puff into the lungs daily. 09/02/18   [provider]  aspirin EC 81 MG tablet Take 81 mg by mouth daily.    [provider]  Calcium Carb-Cholecalciferol (CALCIUM 500+D3) 500-400 MG-UNIT TABS Take 1 tablet by mouth daily.    [provider]  docusate sodium (COLACE) 100 MG capsule Take 100-200 mg by mouth every evening.     [provider]  isosorbide dinitrate (ISORDIL) 30 MG tablet Take 30 mg by mouth daily.    [provider]  losartan (COZAAR) 25 MG tablet Take 25 mg by mouth daily.    [provider]  Multiple Vitamins-Minerals (MULTIVITAMIN PO) Take 1 tablet by mouth daily.    [provider]  nitroGLYCERIN (NITROSTAT) 0.4 MG SL tablet Place 0.4 mg under the tongue every 5 (five) minutes as needed for chest pain.    [provider]  predniSONE (DELTASONE) 10 MG tablet Take 10 mg by mouth daily with breakfast.    [provider]  Red Yeast Rice 600 MG CAPS Take 1,200 mg by mouth 2 (two) times daily.     [provider]  tamsulosin (FLOMAX) 0.4 MG CAPS capsule Take 0.4 mg by mouth.    [provider]  zolpidem (AMBIEN) 5 MG tablet Take 5 mg by mouth at bedtime as needed for sleep.    [provider]    Physical Exam: Vitals:   01/22/21 0700 01/22/21 0715 01/22/21 0730 01/22/21 0800  BP: (!) 134/91  121/80 139/74  Pulse: 100 73 68 (!) 106  Resp: 18 19 18 10   Temp:      TempSrc:      SpO2: 98% 96% 99% 98%  Weight:      Height:       General: Not in acute distress HEENT:       Eyes: PERRL, EOMI, no scleral icterus.       ENT: No discharge from the ears and nose, no pharynx injection, no tonsillar  enlargement.        Neck: No JVD, no bruit, no mass felt. Heme: No neck lymph node enlargement. Cardiac: S1/S2, irregularly irregular rhythm, No murmurs, No gallops or rubs. Respiratory: No rales, wheezing, rhonchi or rubs. GI: Soft, nondistended, nontender, no rebound pain, no organomegaly, BS present. GU: No hematuria Ext: No pitting leg edema bilaterally. 1+DP/PT pulse bilaterally. Musculoskeletal: No joint deformities, No joint redness or warmth, no limitation of ROM in spin. Skin: No rashes.  Neuro: Alert, oriented X3, cranial nerves II-XII grossly intact, moves all extremities normally.  Psych: Patient is not psychotic, no suicidal or hemocidal ideation.  Labs on Admission: I have personally reviewed following labs and imaging studies  CBC: Recent Labs  Lab 01/22/21 0632  WBC 7.7  NEUTROABS 4.5  HGB 15.3  HCT 44.7  MCV 94.5  PLT 194   Basic Metabolic Panel: Recent Labs  Lab 01/22/21 0632 01/22/21 0807  NA 138  --   K 4.7  --   CL 104  --   CO2 25  --   GLUCOSE 85  --   BUN 30*  --   CREATININE 1.26*  --   CALCIUM 9.1  --   MG  --  2.2   GFR: Estimated Creatinine Clearance: 31.4 mL/min (A) (by C-G formula based on SCr of 1.26 mg/dL (H)). Liver Function Tests: Recent Labs  Lab 01/22/21 0632  AST 5*  ALT 10  ALKPHOS 58  BILITOT  1.6*  PROT 7.1  ALBUMIN 3.8   Recent Labs  Lab 01/22/21 0632  LIPASE 34   No results for input(s): AMMONIA in the last 168 hours. Coagulation Profile: Recent Labs  Lab 01/22/21 0632 01/22/21 0824  INR 0.9 1.1   Cardiac Enzymes: No results for input(s): CKTOTAL, CKMB, CKMBINDEX, TROPONINI in the last 168 hours. BNP (last 3 results) No results for input(s): PROBNP in the last 8760 hours. HbA1C: No results for input(s): HGBA1C in the last 72 hours. CBG: No results for input(s): GLUCAP in the last 168 hours. Lipid Profile: No results for input(s): CHOL, HDL, LDLCALC, TRIG, CHOLHDL, LDLDIRECT in the last 72 hours. Thyroid Function Tests: No results for input(s): TSH, T4TOTAL, FREET4, T3FREE, THYROIDAB in the last 72 hours. Anemia Panel: No results for input(s): VITAMINB12, FOLATE, FERRITIN, TIBC, IRON, RETICCTPCT in the last 72 hours. Urine analysis:    Component Value Date/Time   COLORURINE YELLOW (A) 03/17/2018 0424   APPEARANCEUR HAZY (A) 03/17/2018 0424   LABSPEC 1.006 03/17/2018 0424   PHURINE 7.0 03/17/2018 0424   GLUCOSEU NEGATIVE 03/17/2018 0424   HGBUR LARGE (A) 03/17/2018 0424   BILIRUBINUR NEGATIVE 03/17/2018 0424   KETONESUR NEGATIVE 03/17/2018 0424   PROTEINUR 100 (A) 03/17/2018 0424   NITRITE POSITIVE (A) 03/17/2018 0424   LEUKOCYTESUR MODERATE (A) 03/17/2018 0424   Sepsis Labs: (procalcitonin:4,lacticidven:4) ) Recent Results (from the past 240 hour(s))  Resp Panel by RT-PCR (Flu A&B, Covid) Nasopharyngeal Swab     Status: None   Collection Time: 01/22/21  8:24 AM   Specimen: Nasopharyngeal Swab; Nasopharyngeal(NP) swabs in vial transport medium  Result Value Ref Range Status   SARS Coronavirus 2 by RT PCR NEGATIVE NEGATIVE Final    Comment: (NOTE) SARS-CoV-2 target nucleic acids are NOT DETECTED.  The SARS-CoV-2 RNA is generally detectable in upper respiratory specimens during the acute phase of infection. The lowest concentration of  SARS-CoV-2 viral copies this assay can detect is 138 copies/mL. A negative result does not preclude SARS-Cov-2 infection and should not  be used as the sole basis for treatment or other patient management decisions. A negative result may occur with  improper specimen collection/handling, submission of specimen other than nasopharyngeal swab, presence of viral mutation(s) within the areas targeted by this assay, and inadequate number of viral copies(<138 copies/mL). A negative result must be combined with clinical observations, patient history, and epidemiological information. The expected result is Negative.  Fact Sheet for Patients:  BloggerCourse.com  Fact Sheet for Healthcare Providers:  SeriousBroker.it  This test is no t yet approved or cleared by the [place] FDA and  has been authorized for detection and/or diagnosis of SARS-CoV-2 by FDA under an Emergency Use Authorization (EUA). This EUA will remain  in effect (meaning this test can be used) for the duration of the COVID-19 declaration under Section 564(b)(1) of the Act, 21 U.S.C.section 360bbb-3(b)(1), unless the authorization is terminated  or revoked sooner.       Influenza A by PCR NEGATIVE NEGATIVE Final   Influenza B by PCR NEGATIVE NEGATIVE Final    Comment: (NOTE) The Xpert Xpress SARS-CoV-2/FLU/RSV plus assay is intended as an aid in the diagnosis of influenza from Nasopharyngeal swab specimens and should not be used as a sole basis for treatment. Nasal washings and aspirates are unacceptable for Xpert Xpress SARS-CoV-2/FLU/RSV testing.  Fact Sheet for Patients: BloggerCourse.com  Fact Sheet for Healthcare Providers: SeriousBroker.it  This test is not yet approved or cleared by the [place] FDA and has been authorized for detection and/or diagnosis of SARS-CoV-2 by FDA under an Emergency Use  Authorization (EUA). This EUA will remain in effect (meaning this test can be used) for the duration of the COVID-19 declaration under Section 564(b)(1) of the Act, 21 U.S.C. section 360bbb-3(b)(1), unless the authorization is terminated or revoked.  Performed at [facility], [address], [place], [place] [address]      Radiological Exams on Admission: DG Chest [place] 1 View  Result Date: 01/22/2021 CLINICAL DATA:  87 year old male with chest pain which woke him at 0500 hours. Atrial fibrillation. EXAM: PORTABLE CHEST 1 VIEW COMPARISON:  CT Abdomen and Pelvis 04/23/2020. FINDINGS: Portable AP upright view at 0645 hours. Borderline to mild cardiomegaly appears stable. Other mediastinal contours are within normal limits. Visualized tracheal air column is within normal limits. Allowing for portable technique the lungs are clear. No pneumothorax. Right shoulder arthroplasty with heterogeneous lucency about the visible hardware, suggestive of particle disease. Advanced left glenohumeral degeneration. No acute osseous abnormality identified. Negative visible bowel gas pattern. IMPRESSION: No acute cardiopulmonary abnormality. Electronically Signed   By: Odessa Fleming M.D.   On: 01/22/2021 06:55     EKG: I have personally reviewed.  Atrial fibrillation, heart rate 136, QTc 500, ST depression in precordial leads and lead II  Assessment/Plan Principal Problem:   Chest pain Active Problems:   CKD (chronic kidney disease), stage IIIb   COPD (chronic obstructive pulmonary disease) (HCC)   Coronary artery disease   Hypertension   Atrial fibrillation with RVR (HCC)   Polymyalgia rheumatica (HCC)   Chest pain and hx of CAD: his chest pain has since subsided.  Troponin level 8 -->12.  Possibly due to demand ischemia secondary to A. fib with RVR.  Dr. Juliann Pares of cardiology is consulted.  - place to progressive unit for observation - Trend Trop - Repeat EKG in the am  - prn Nitroglycerin,  Morphine, ASA, Imdur - Risk factor stratification: will check FLP and A1C   Atrial fibrillation with RVR :  HR is up to 140. Pt was given Cardizem IV 10 mg and oral 30 mg in ED.  Heart rate improved to 60s. -IV heparin is started in ED --> switch to Eliquis 5 mg twice daily per card -Amiodarone 200 mg twice daily per cards -check Mg  CKD (chronic kidney disease), stage IIIb: stable -f/u with BMP  COPD (chronic obstructive pulmonary disease) (HCC): stable -Bronchodilators  Hypertension -IV hydralazine as needed -Continue amlodipine, Cozaar,  Polymyalgia rheumatica (HCC) -50 mg of Solu-Cortef as stress dose -Increase home prednisone dose from 7 to 15 mg daily      DVT ppx: on IV Heparin    Code Status: DNR (I discussed with patient in the presence of her wife, and explained the meaning of CODE STATUS. Patient wants to be DNR) Family Communication:   Yes, patient's wife   at bed side Disposition Plan:  Anticipate discharge back to previous environment Consults called:  Dr. Juliann Pares of card Admission status and Level of care: Progressive Cardiac:    for obs    Status is: Observation  The patient remains OBS appropriate and will d/c before 2 midnights.  Dispo: The patient is from: Home              Anticipated d/c is to: Home              Patient currently is not medically stable to d/c.   Difficult to place patient No           Date of Service 01/22/2021    Lorretta Harp [facility]   If 7PM-7AM, please contact night-coverage [email or web address] 01/22/2021, 9:55 AM

## 2021-01-22 NOTE — ED Notes (Signed)
Breakfast tray at bedside 

## 2021-01-22 NOTE — Consult Note (Signed)
CARDIOLOGY CONSULT NOTE               Patient ID: Caleb Khan MRN: [id number] DOB/AGE: 1928-06-05 87 y.o.  Admit date: 01/22/2021 Referring Physician: Lorretta Harp, MD  Primary Physician: Marisue Ivan, MD Primary Cardiologist: Dorothyann Peng, MD  Reason for Consultation: new onset atrial fibrillation with RVR  HPI: Caleb Khan is a 87 year old male patient with PMH significant for CAD, mitral valve prolapse, hypertension, hypercholesterolemia, CKD stage IIIb, COPD (pulmonary emphysema), anemia of chronic disease, polymyalgia rheumatica, Graves' disease and sleep apnea who presented to [facility] ED due to c/o chest pain that woke him up from his sleep. Upon EMS arrival the patient was noted to be in atrial fibrillation with RVR and he was given sL nitroglycerin and 324mg  of ASA.   ED Course: patient was found to be in atrial fibrillation with RVR at HR of 130 bpm, BNP elevated at 423, high sensitivity troponin = 8>>12, CXR unremarkable. The patient was given diltiazem 10 mg IV and started on heparin gtt. Thus cardiology was consulted.   Upon examination the patient reports to be doing well. He states that the chest pain and dyspnea has completely resolved; furthermore, he states that he is now feeling like himself. He denies having any palpitations, dizziness or lightheadedness at this time. He is resting comfortably in the ED stretches with rails up, on the monitor, he has just finished eating his lunch and he is in no apparent distress.   Review of systems complete and found to be negative unless listed above    Past Medical History:  Diagnosis Date   Anemia    Anginal pain (HCC)    Arthritis    osteoarthritis   Chronic kidney disease    stage 3   COPD (chronic obstructive pulmonary disease) (HCC)    NO INHALERS   Coronary artery disease    Dyspnea    with exertion   Hypertension    Polymyalgia rheumatica (HCC)    TAKES PREDNISONE   Spinal stenosis     Past Surgical  History:  Procedure Laterality Date   CARDIAC CATHETERIZATION Left 05/22/2016   Procedure: Left Heart Cath and Coronary Angiography;  Surgeon: 05/24/2016, MD;  Location: [facility];  Service: Cardiovascular;  Laterality: Left;   EYE SURGERY Bilateral    Cataract Extraction with IOL   GREEN LIGHT LASER TURP (TRANSURETHRAL RESECTION OF PROSTATE N/A 04/26/2018   Procedure: GREEN LIGHT LASER TURP (TRANSURETHRAL RESECTION OF PROSTATE;  Surgeon: 06/26/2018, MD;  Location: [facility];  Service: Urology;  Laterality: N/A;   INTRAVASCULAR PRESSURE WIRE/FFR STUDY N/A 09/15/2018   Procedure: INTRAVASCULAR PRESSURE WIRE/FFR STUDY;  Surgeon: 09/17/2018, MD;  Location: [facility];  Service: Cardiovascular;  Laterality: N/A;   JOINT REPLACEMENT Right 2004   Shoulder Replacement   JOINT REPLACEMENT Left 2007   Ankle Replacement   LEFT HEART CATH AND CORONARY ANGIOGRAPHY Left 09/15/2018   Procedure: LEFT HEART CATH AND CORONARY ANGIOGRAPHY;  Surgeon: 09/17/2018, MD;  Location: [facility];  Service: Cardiovascular;  Laterality: Left;   PATELLAR TENDON REPAIR Left 07/15/2017   Procedure: PATELLA TENDON REPAIR;  Surgeon: 07/17/2017, MD;  Location: [facility] OR[facility];  Service: Orthopedics;  Laterality: Left;   QUADRICEPS TENDON REPAIR Left 07/15/2017   Procedure: REPAIR QUADRICEP TENDON;  Surgeon: 07/17/2017, MD;  Location: [facility];  Service: Orthopedics;  Laterality: Left;   TONSILLECTOMY     VASECTOMY      (  Not in a hospital admission)  Social History   Socioeconomic History   Marital status: Married    Spouse name: Not on file   Number of children: Not on file   Years of education: Not on file   Highest education level: Not on file  Occupational History   Not on file  Tobacco Use   Smoking status: Former    Packs/day: 2.00    Years: 22.00    Pack years: 44.00    Types: Cigarettes    Quit date: 05/22/1972    Years since quitting: 48.7    Smokeless tobacco: Never  Vaping Use   Vaping Use: Never used  Substance and Sexual Activity   Alcohol use: Yes    Alcohol/week: 7.0 standard drinks    Types: 7 Glasses of wine per week    Comment: daily   Drug use: No   Sexual activity: Not on file  Other Topics Concern   Not on file  Social History Narrative   Not on file   Social Determinants of Health   Financial Resource Strain: Not on file  Food Insecurity: Not on file  Transportation Needs: Not on file  Physical Activity: Not on file  Stress: Not on file  Social Connections: Not on file  Intimate Partner Violence: Not on file    Family History  Problem Relation Age of Onset   Hypertension Mother       Review of systems complete and found to be negative unless listed above      PHYSICAL EXAM  General: Well developed, well nourished, in no acute distress HEENT:  Normocephalic and atraumatic, PERRL Neck:  No JVD.  Lungs: Clear bilaterally to auscultation. Chest expansion symmetrical, no rales, rhonchi or wheezes Heart: HRRR . Normal S1 and S2 without gallops or murmurs.  Abdomen: Bowel sounds are positive, abdomen soft and non-tender  Msk:  Normal strength and tone for age. Extremities: No clubbing, cyanosis or edema.   Neuro: Alert and oriented X 3. Psych:  Good affect, responds appropriately  Labs:   Lab Results  Component Value Date   WBC 7.7 01/22/2021   HGB 15.3 01/22/2021   HCT 44.7 01/22/2021   MCV 94.5 01/22/2021   PLT 194 01/22/2021    Recent Labs  Lab 01/22/21 0632  NA 138  K 4.7  CL 104  CO2 25  BUN 30*  CREATININE 1.26*  CALCIUM 9.1  PROT 7.1  BILITOT 1.6*  ALKPHOS 58  ALT 10  AST 5*  GLUCOSE 85   Lab Results  Component Value Date   TROPONINI <0.03 03/17/2018   No results found for: CHOL No results found for: HDL No results found for: LDLCALC No results found for: TRIG No results found for: CHOLHDL No results found for: LDLDIRECT    Radiology: DG Chest Port 1  View  Result Date: 01/22/2021 CLINICAL DATA:  87 year old male with chest pain which woke him at 0500 hours. Atrial fibrillation. EXAM: PORTABLE CHEST 1 VIEW COMPARISON:  CT Abdomen and Pelvis 04/23/2020. FINDINGS: Portable AP upright view at 0645 hours. Borderline to mild cardiomegaly appears stable. Other mediastinal contours are within normal limits. Visualized tracheal air column is within normal limits. Allowing for portable technique the lungs are clear. No pneumothorax. Right shoulder arthroplasty with heterogeneous lucency about the visible hardware, suggestive of particle disease. Advanced left glenohumeral degeneration. No acute osseous abnormality identified. Negative visible bowel gas pattern. IMPRESSION: No acute cardiopulmonary abnormality. Electronically Signed   By: Odessa Fleming  M.D.   On: 01/22/2021 06:55    EKG: Atrial fibrillation with RVR  Bedside telemetry: SR with HR of 63 bpm  ASSESSMENT AND PLAN:  Mr. Ruderman is a 87 year old male patient with PMH significant for CAD, mitral valve prolapse, hypertension, hypercholesterolemia, CKD stage IIIb, COPD (pulmonary emphysema), anemia of chronic disease, polymyalgia rheumatica, Graves' disease and sleep apnea who presented to [facility] ED due to c/o chest pain that woke him up from his sleep. Upon EMS arrival the patient was noted to be in atrial fibrillation with RVR and he was given sL nitroglycerin and 324mg  of ASA.  Upon EMS arrival patient was found to be in atrial fibrillation with RVR at HR of 130 bpm, BNP elevated at 423, high sensitivity troponin = 8>>12, CXR unremarkable. The patient was given diltiazem 10 mg IV and start on heparin gtt. The patient's HR decreased but he continued to be in atrial fibrillation; however, upon our examination, the patient has now converted back to NSR with a HR of 63 bpm. He is now asymptomatic. Recommend repeat echocardiogram at this time, we will transition the patient to oral anticoagulation and start him on  amiodarone load for 1 week as we do not want to induce bradycardia and for the reason that the patient has converted back to SR at this time.   Atrial fibrillation with RVR, resolved at this time, the patient is now in normal sinus rhythm Agree with admit to cardiac unit with continuous telemetry monitoring until discharge. We will start the patient on amiodarone 200 mg twice daily for 1 week.  We will then continue amiodarone 200 mg once daily thereafter. We will discontinue the heparin drip at this time and transition the patient to Eliquis 5 mg twice daily for stroke risk reduction. CAD, reasonably stable, patient denies chest pain at this time Aspirin not recommended at this time due to starting anticoagulation with eliquis and increased risk of bleeding in the elderly.  Patient is a poor beta-blocker candidate due to bradycardia. Recommend following heart healthy diet. Hypertension, reasonable control, patient is normotensive on today Recommend continuing amlodipine and losartan therapy. Recommend following a low-sodium diet. Continuing to monitor vital signs per protocol. CKD stage IIIb, reasonably stable Agree with current management. COPD, not exacerbated Agree with conservative management this time.  The patient's history, physical exam findings and plan of care were all discussed with Dr. D. Callwood, whom also evaluated the patient, and all decision making was made in collaboration with him.   Signed: Oryon Gary [id number]-AG 01/22/2021, 2:01 PM

## 2021-01-22 NOTE — Consult Note (Signed)
ANTICOAGULATION CONSULT NOTE   Pharmacy Consult for heparin drip Indication: new onset atrial fibrillation and angina  Allergies  Allergen Reactions   Benazepril Cough    Patient Measurements: Height: 5\' 3"  (160 cm) Weight: 63.3 kg (139 lb 8 oz) IBW/kg (Calculated) : 56.9 Heparin Dosing Weight: 63.5kg  Vital Signs: Temp: 97.5 F (36.4 C) (06/29 1532) Temp Source: Oral (06/29 0630) BP: 145/85 (06/29 1532) Pulse Rate: 81 (06/29 1532)  Labs: Recent Labs    01/22/21 01/24/21 01/22/21 01/24/21 01/22/21 0807 01/22/21 0824 01/22/21 1032 01/22/21 1555  HGB 15.3  --   --   --   --   --   HCT 44.7  --   --   --   --   --   PLT 194  --   --   --   --   --   APTT  --  20*  --  132*  --   --   LABPROT 12.4  --   --  13.7  --   --   INR 0.9  --   --  1.1  --   --   HEPARINUNFRC  --   --   --   --   --  0.60  CREATININE 1.26*  --   --   --   --   --   TROPONINIHS 8  --  12  --  18* 22*     Estimated Creatinine Clearance: 31.4 mL/min (A) (by C-G formula based on SCr of 1.26 mg/dL (H)).   Medical History: Past Medical History:  Diagnosis Date   Anemia    Anginal pain (HCC)    Arthritis    osteoarthritis   Chronic kidney disease    stage 3   COPD (chronic obstructive pulmonary disease) (HCC)    NO INHALERS   Coronary artery disease    Dyspnea    with exertion   Hypertension    Polymyalgia rheumatica (HCC)    TAKES PREDNISONE   Spinal stenosis     Medications:  No pta anticoagulation of record  Assessment: 87 y.o. male with acute onset chest pain and new onset a fib.  Baseline CBC/INR as above.  Pharmacy has been consulted to initiate and monitor a heparin drip.  6/29 @ 1555 HL= 0.60  cont rate of 900 u/hr  Goal of Therapy:  Heparin level 0.3-0.7 units/ml Monitor platelets by anticoagulation protocol: Yes   Plan:  6/29 @ 1555 HL= 0.60  therapeutic. Continue rate of 900 units per hour F/u confirmatory heparin level in 8 hours per protocol Daily CBC's while on  heparin drip  7/29, PharmD Clinical Pharmacist 01/22/2021 5:20 PM

## 2021-01-22 NOTE — ED Notes (Signed)
Repeat blue top sent to lab 

## 2021-01-22 NOTE — ED Notes (Signed)
Pt denies cp or shob at this time. Alert and oriented. Call bell in reach

## 2021-01-22 NOTE — ED Provider Notes (Signed)
[facility] Emergency Department Provider Note  ____________________________________________   Event Date/Time   First MD Initiated Contact with Patient 01/22/21 [phone]     (approximate)  I have reviewed the triage vital signs and the nursing notes.   HISTORY  Chief Complaint Chest Pain    HPI Caleb Khan is a 87 y.o. male with medical history as listed below who presents by EMS for evaluation of acute onset chest pain.  He says that it woke him from sleep at about 5 AM.  He was having sharp and pressure-like pain in the left side of his chest that was radiating down his left arm.  When EMS arrived, they found that he was in A. fib with a rate of about 140.  They provided a full dose aspirin.  They provided 1 nitroglycerin sublingual which reduces discomfort down to about a 2.  Upon arrival to the ED, the patient says he feels "pretty good".  He is no longer feeling chest pain but said that it still does not feel quite normal.  He was short of breath when he first awoke with the chest pain but was not short of breath prior to going to bed.  He denies fever, nausea, vomiting, and abdominal pain.  He has history of coronary artery disease but is unaware of any history of atrial fibrillation.  Dr. Juliann Paresallwood is his cardiologist.     Past Medical History:  Diagnosis Date   Anemia    Anginal pain (HCC)    Arthritis    osteoarthritis   Chronic kidney disease    stage 3   COPD (chronic obstructive pulmonary disease) (HCC)    NO INHALERS   Coronary artery disease    Dyspnea    with exertion   Hypertension    Polymyalgia rheumatica (HCC)    TAKES PREDNISONE   Spinal stenosis     Patient Active Problem List   Diagnosis Date Noted   Sepsis (HCC) 03/17/2018    Past Surgical History:  Procedure Laterality Date   CARDIAC CATHETERIZATION Left 05/22/2016   Procedure: Left Heart Cath and Coronary Angiography;  Surgeon: Alwyn Peawayne D Callwood, MD;  Location:  [facility] INVASIVE CV LAB;  Service: Cardiovascular;  Laterality: Left;   EYE SURGERY Bilateral    Cataract Extraction with IOL   GREEN LIGHT LASER TURP (TRANSURETHRAL RESECTION OF PROSTATE N/A 04/26/2018   Procedure: GREEN LIGHT LASER TURP (TRANSURETHRAL RESECTION OF PROSTATE;  Surgeon: Orson ApeWolff, Michael R, MD;  Location: [facility] ORS;  Service: Urology;  Laterality: N/A;   INTRAVASCULAR PRESSURE WIRE/FFR STUDY N/A 09/15/2018   Procedure: INTRAVASCULAR PRESSURE WIRE/FFR STUDY;  Surgeon: Alwyn Peaallwood, Dwayne D, MD;  Location: [facility] INVASIVE CV LAB;  Service: Cardiovascular;  Laterality: N/A;   JOINT REPLACEMENT Right 2004   Shoulder Replacement   JOINT REPLACEMENT Left 2007   Ankle Replacement   LEFT HEART CATH AND CORONARY ANGIOGRAPHY Left 09/15/2018   Procedure: LEFT HEART CATH AND CORONARY ANGIOGRAPHY;  Surgeon: Alwyn Peaallwood, Dwayne D, MD;  Location: [facility] INVASIVE CV LAB;  Service: Cardiovascular;  Laterality: Left;   PATELLAR TENDON REPAIR Left 07/15/2017   Procedure: PATELLA TENDON REPAIR;  Surgeon: Kennedy BuckerMenz, Michael, MD;  Location: [facility] ORS;  Service: Orthopedics;  Laterality: Left;   QUADRICEPS TENDON REPAIR Left 07/15/2017   Procedure: REPAIR QUADRICEP TENDON;  Surgeon: Kennedy BuckerMenz, Michael, MD;  Location: [facility] ORS;  Service: Orthopedics;  Laterality: Left;   TONSILLECTOMY     VASECTOMY      Prior to Admission medications  Medication Sig Start Date End Date Taking? Authorizing Provider  acetaminophen (TYLENOL) 500 MG tablet Take 1,000 mg by mouth every 8 (eight) hours as needed (pain).    [provider]  amLODipine (NORVASC) 5 MG tablet Take 5 mg by mouth every morning.     [provider]  ANORO ELLIPTA 62.5-25 MCG/INH AEPB Inhale 1 puff into the lungs daily. 09/02/18   [provider]  aspirin EC 81 MG tablet Take 81 mg by mouth daily.    [provider]  Calcium Carb-Cholecalciferol (CALCIUM 500+D3) 500-400 MG-UNIT TABS Take 1 tablet by mouth daily.    [provider]   docusate sodium (COLACE) 100 MG capsule Take 100-200 mg by mouth every evening.     [provider]  isosorbide dinitrate (ISORDIL) 30 MG tablet Take 30 mg by mouth daily.    [provider]  losartan (COZAAR) 25 MG tablet Take 25 mg by mouth daily.    [provider]  Multiple Vitamins-Minerals (MULTIVITAMIN PO) Take 1 tablet by mouth daily.    [provider]  nitroGLYCERIN (NITROSTAT) 0.4 MG SL tablet Place 0.4 mg under the tongue every 5 (five) minutes as needed for chest pain.    [provider]  predniSONE (DELTASONE) 10 MG tablet Take 10 mg by mouth daily with breakfast.    [provider]  Red Yeast Rice 600 MG CAPS Take 1,200 mg by mouth 2 (two) times daily.    [provider]  tamsulosin (FLOMAX) 0.4 MG CAPS capsule Take 0.4 mg by mouth.    [provider]  zolpidem (AMBIEN) 5 MG tablet Take 5 mg by mouth at bedtime as needed for sleep.    [provider]    Allergies Benazepril  Family History  Problem Relation Age of Onset   Hypertension Mother     Social History Social History   Tobacco Use   Smoking status: Former    Packs/day: 2.00    Years: 22.00    Pack years: 44.00    Types: Cigarettes    Quit date: 05/22/1972    Years since quitting: 48.7   Smokeless tobacco: Never  Vaping Use   Vaping Use: Never used  Substance Use Topics   Alcohol use: Yes    Alcohol/week: 7.0 standard drinks    Types: 7 Glasses of wine per week    Comment: daily   Drug use: No    Review of Systems Constitutional: No fever/chills Eyes: No visual changes. ENT: No sore throat. Cardiovascular: Positive for acute onset chest pain and atrial fibrillation with RVR. Respiratory: Positive for shortness of breath. Gastrointestinal: No abdominal pain.  No nausea, no vomiting.  No diarrhea.  No constipation. Genitourinary: Negative for dysuria. Musculoskeletal: Negative for neck pain.  Negative for back  pain. Integumentary: Negative for rash. Neurological: Negative for headaches, focal weakness or numbness.   ____________________________________________   PHYSICAL EXAM:  VITAL SIGNS: ED Triage Vitals  Enc Vitals Group     BP 01/22/21 0630 132/88     Pulse Rate 01/22/21 0630 (!) 123     Resp 01/22/21 0630 16     Temp 01/22/21 0630 97.8 F (36.6 C)     Temp Source 01/22/21 0630 Oral     SpO2 01/22/21 0630 96 %     Weight 01/22/21 0629 63.5 kg (140 lb)     Height 01/22/21 0629 1.6 m (5\' 3" )     Head Circumference --      Peak  Flow --      Pain Score 01/22/21 0627 0     Pain Loc --      Pain Edu? --      Excl. in GC? --     Constitutional: Alert and oriented.  Eyes: Conjunctivae are normal.  Head: Atraumatic. Nose: No congestion/rhinnorhea. Mouth/Throat: Patient is wearing a mask. Neck: No stridor.  No meningeal signs.   Cardiovascular: Irregular rhythm with rapid rate in the 120s to 140s.  Good peripheral circulation. Respiratory: Normal respiratory effort.  No retractions. Gastrointestinal: Soft and nontender. No distention.  Musculoskeletal: No lower extremity tenderness nor edema. No gross deformities of extremities. Neurologic:  Normal speech and language. No gross focal neurologic deficits are appreciated.  Skin:  Skin is warm, dry and intact. Psychiatric: Mood and affect are normal. Speech and behavior are normal.  ____________________________________________   LABS (all labs ordered are listed, but only abnormal results are displayed)  Labs Reviewed  BRAIN NATRIURETIC PEPTIDE - Abnormal; Notable for the following components:      Result Value   B Natriuretic Peptide 423.2 (*)    All other components within normal limits  PROTIME-INR  CBC WITH DIFFERENTIAL/PLATELET  COMPREHENSIVE METABOLIC PANEL  LIPASE, BLOOD  TROPONIN I (HIGH SENSITIVITY)   ____________________________________________  EKG  ED ECG REPORT I, Loleta Rose, the attending physician,  personally viewed and interpreted this ECG.  Date: 01/22/2021 EKG Time: 6:29 AM Rate: 136 Rhythm: Atrial fibrillation with RVR QRS Axis: normal Intervals: normal ST/T Wave abnormalities: ST segment depression in leads V3, V4, and V5.  No significant reciprocal changes Narrative Interpretation: Findings suggestive of acute ischemia in the setting of new onset atrial fibrillation, but does not meet STEMI criteria. ____________________________________________  RADIOLOGY I, Loleta Rose, personally viewed and evaluated these images (plain radiographs) as part of my medical decision making, as well as reviewing the written report by the radiologist.  ED MD interpretation: No acute abnormality on chest x-ray  Official radiology report(s): DG Chest Port 1 View  Result Date: 01/22/2021 CLINICAL DATA:  87 year old male with chest pain which woke him at 0500 hours. Atrial fibrillation. EXAM: PORTABLE CHEST 1 VIEW COMPARISON:  CT Abdomen and Pelvis 04/23/2020. FINDINGS: Portable AP upright view at 0645 hours. Borderline to mild cardiomegaly appears stable. Other mediastinal contours are within normal limits. Visualized tracheal air column is within normal limits. Allowing for portable technique the lungs are clear. No pneumothorax. Right shoulder arthroplasty with heterogeneous lucency about the visible hardware, suggestive of particle disease. Advanced left glenohumeral degeneration. No acute osseous abnormality identified. Negative visible bowel gas pattern. IMPRESSION: No acute cardiopulmonary abnormality. Electronically Signed   By: Odessa Fleming M.D.   On: 01/22/2021 06:55    ____________________________________________   PROCEDURES   Procedure(s) performed (including Critical Care):  .1-3 Lead EKG Interpretation  Date/Time: 01/22/2021 7:22 AM Performed by: Loleta Rose, MD Authorized by: Loleta Rose, MD     Interpretation: abnormal     ECG rate:  130   ECG rate assessment: tachycardic      Rhythm: sinus tachycardia     Ectopy: none     Conduction: normal   .Critical Care  Date/Time: 01/22/2021 7:22 AM Performed by: Loleta Rose, MD Authorized by: Loleta Rose, MD   Critical care provider statement:    Critical care time (minutes):  45   Critical care time was exclusive of:  Separately billable procedures and treating other patients   Critical care was necessary to treat or prevent imminent or life-threatening deterioration  of the following conditions:  Cardiac failure   Critical care was time spent personally by me on the following activities:  Development of treatment plan with patient or surrogate, discussions with consultants, evaluation of patient's response to treatment, examination of patient, obtaining history from patient or surrogate, ordering and performing treatments and interventions, ordering and review of laboratory studies, ordering and review of radiographic studies, pulse oximetry, re-evaluation of patient's condition and review of old charts   ____________________________________________   INITIAL IMPRESSION / MDM / ASSESSMENT AND PLAN / ED COURSE  As part of my medical decision making, I reviewed the following data within the electronic MEDICAL RECORD NUMBER Nursing notes reviewed and incorporated, Labs reviewed , EKG interpreted , Old chart reviewed, Patient signed out to Dr. Lenard Lance, Radiograph reviewed , and reviewed Notes from prior ED visits   Differential diagnosis includes, but is not limited to, new onset a-fib, angina, ACS, PNA, PE.  The patient is on the cardiac monitor to evaluate for evidence of arrhythmia and/or significant heart rate changes.  Patient is not on anticoagulation.  Vitals normal except for HR of 120s-140s.  Normotensive.  No hx of a-fib in the record.  Pain mostly resolved but not completely.  EKG demonstrates the atrial fibrillation and suggests ischemia particularly laterally, possibly rate related.  CBC is within  normal limits.  Metabolic panel, lipase, troponin, and BNP are all pending.  Coags are normal.  I ordered diltiazem 10 mg IV and the patient's came down to the 70s to 90s, but he is still in atrial fibrillation.  I ordered heparin bolus plus infusion for the combination of A. fib and possible unstable angina.  Transferring ED care to Dr. Lenard Lance to follow-up on labs and admit the patient.  Patient arrived with DNR paperwork and I confirmed DNR status.         ____________________________________________  FINAL CLINICAL IMPRESSION(S) / ED DIAGNOSES  Final diagnoses:  New onset atrial fibrillation (HCC)  Atrial fibrillation with RVR (HCC)  Chest pain, unspecified type  DNR (do not resuscitate)     MEDICATIONS GIVEN DURING THIS VISIT:  Medications  diltiazem (CARDIZEM) injection 10 mg (10 mg Intravenous Given 01/22/21 8828)     ED Discharge Orders     None        Note:  This document was prepared using Dragon voice recognition software and may include unintentional dictation errors.   Loleta Rose, MD 01/22/21 [phone]

## 2021-01-22 NOTE — Consult Note (Signed)
ANTICOAGULATION CONSULT NOTE - Initial Consult  Pharmacy Consult for heparin drip Indication: new onset atrial fibrillation and angina  Allergies  Allergen Reactions   Benazepril Cough    Patient Measurements: Height: 5\' 3"  (160 cm) Weight: 63.5 kg (140 lb) IBW/kg (Calculated) : 56.9 Heparin Dosing Weight: 63.5kg  Vital Signs: Temp: 97.8 F (36.6 C) (06/29 0630) Temp Source: Oral (06/29 0630) BP: 121/80 (06/29 0730) Pulse Rate: 68 (06/29 0730)  Labs: Recent Labs    01/22/21 0632  HGB 15.3  HCT 44.7  PLT 194  LABPROT 12.4  INR 0.9  CREATININE 1.26*  TROPONINIHS 8    Estimated Creatinine Clearance: 31.4 mL/min (A) (by C-G formula based on SCr of 1.26 mg/dL (H)).   Medical History: Past Medical History:  Diagnosis Date   Anemia    Anginal pain (HCC)    Arthritis    osteoarthritis   Chronic kidney disease    stage 3   COPD (chronic obstructive pulmonary disease) (HCC)    NO INHALERS   Coronary artery disease    Dyspnea    with exertion   Hypertension    Polymyalgia rheumatica (HCC)    TAKES PREDNISONE   Spinal stenosis     Medications:  No pta anticoagulation of record  Assessment: 87 y.o. male with acute onset chest pain and new onset a fib.  Baseline CBC/INR as above.  Pharmacy has been consulted to initiate and monitor a heparin drip.  Goal of Therapy:  Heparin level 0.3-0.7 units/ml Monitor platelets by anticoagulation protocol: Yes   Plan:  Will obtain add-on aptt lab from am labs (now in process)  Will give 3000 unit IV heparin bolus, followed by 900 units per hour  Will obtain first heparin level in 8 hours per protocol  Daily CBC's while on heparin drip  95, PharmD, BCPS Clinical Pharmacist 01/22/2021 7:53 AM

## 2021-01-23 ENCOUNTER — Observation Stay
Admit: 2021-01-23 | Discharge: 2021-01-23 | Disposition: A | Discharge status: Home / Self Care | Payer: Medicare PPO | Attending: Student | Admitting: Student

## 2021-01-23 DIAGNOSIS — R079 Chest pain, unspecified: Secondary | ICD-10-CM | POA: Diagnosis not present

## 2021-01-23 DIAGNOSIS — I4891 Unspecified atrial fibrillation: Secondary | ICD-10-CM | POA: Diagnosis not present

## 2021-01-23 DIAGNOSIS — I2510 Atherosclerotic heart disease of native coronary artery without angina pectoris: Secondary | ICD-10-CM | POA: Diagnosis not present

## 2021-01-23 LAB — GLUCOSE, CAPILLARY: Glucose-Capillary: 91 mg/dL (ref 70–99)

## 2021-01-23 LAB — LIPID PANEL
Cholesterol: 225 mg/dL — ABNORMAL HIGH (ref 0–200)
HDL: 103 mg/dL (ref >40)
LDL Cholesterol: 111 mg/dL — ABNORMAL HIGH (ref 0–99)
Total CHOL/HDL Ratio: 2.2 RATIO
Triglycerides: 56 mg/dL (ref <150)
VLDL: 11 mg/dL (ref 0–40)

## 2021-01-23 LAB — HEMOGLOBIN A1C
Hgb A1c MFr Bld: 5.1 % (ref 4.8–5.6)
Mean Plasma Glucose: 100 mg/dL

## 2021-01-23 LAB — CBC
HCT: 40 % (ref 39.0–52.0)
Hemoglobin: 13.9 g/dL (ref 13.0–17.0)
MCH: 32.1 pg (ref 26.0–34.0)
MCHC: 34.8 g/dL (ref 30.0–36.0)
MCV: 92.4 fL (ref 80.0–100.0)
Platelets: 242 10*3/uL (ref 150–400)
RBC: 4.33 MIL/uL (ref 4.22–5.81)
RDW: 14.5 % (ref 11.5–15.5)
WBC: 7.4 10*3/uL (ref 4.0–10.5)
nRBC: 0 % (ref 0.0–0.2)

## 2021-01-23 LAB — ECHOCARDIOGRAM COMPLETE
AR max vel: 2.16 cm2
AV Area VTI: 2.07 cm2
AV Area mean vel: 2.01 cm2
AV Mean grad: 4 mmHg
AV Peak grad: 7.2 mmHg
Ao pk vel: 1.34 m/s
Area-P 1/2: 2.44 cm2
Height: 63 in
MV VTI: 3.45 cm2
P 1/2 time: 563 msec
S' Lateral: 2.6 cm
Weight: 2232 oz

## 2021-01-23 LAB — TSH: TSH: 3.801 u[IU]/mL (ref 0.350–4.500)

## 2021-01-23 MED ORDER — AMIODARONE HCL 200 MG PO TABS: 200.0000 mg | ORAL_TABLET | Freq: Two times a day (BID) | ORAL | 0 refills | Status: DC

## 2021-01-23 MED ORDER — APIXABAN 5 MG PO TABS: 5.0000 mg | ORAL_TABLET | Freq: Two times a day (BID) | ORAL | 0 refills | Status: DC

## 2021-01-23 NOTE — Progress Notes (Signed)
[facility]  Patient Description:Caleb Khan is a 87 year old male patient with PMH significant for CAD, mitral valve prolapse, hypertension, hypercholesterolemia, CKD stage IIIb, COPD (pulmonary emphysema), anemia of chronic disease, polymyalgia rheumatica, Graves' disease and sleep apnea who was admitted due to a diagnosis of atrial fibrillation with RVR; thus cardiology was consulted.   SUBJECTIVE: The patient reports to be doing well on today and denies having any chest pain, palpitations, dizziness or syncope at this time. He states that he is much better and is looking forward to going home.   OBJECTIVE: The patient appears well on today and appears to be asymptomatic. He is back in atrial fibrillation with at controlled rate of 63 bpm at this time. His vital signs are stable and he is able to ambulate around his room without any complications or symptoms.   Vitals:   01/22/21 2040 01/23/21 0455 01/23/21 0627 01/23/21 0719  BP: (!) 147/75 (!) 176/83 (!) 175/96 (!) 158/93  Pulse: (!) 54 (!) 50 (!) 51 (!) 57  Resp: 18 19 18 19   Temp: 97.7 F (36.5 C) 98.6 F (37 C) (!) 97.5 F (36.4 C) (!) 97.4 F (36.3 C)  TempSrc:   Oral Oral  SpO2: 100% 99% 100% 98%  Weight:      Height:         Intake/Output Summary (Last 24 hours) at 01/23/2021 01/25/2021 Last data filed at 01/23/2021 0720 Gross per 24 hour  Intake --  Output 1000 ml  Net -1000 ml      PHYSICAL EXAM  General: Well developed, well nourished, in no acute distress HEENT:  Normocephalic and atraumatic, PERRL Neck:   No JVD. Lungs: Clear bilaterally to auscultation. Chest expansion symmetrical, no rales, rhonchi or wheezes Heart: irregular heart rate and rhythm. Normal S1 and S2 without gallops or murmurs. Abdomen: Bowel sounds are positive, abdomen soft and non-tender Msk:  Normal strength and tone for age. Extremities: No clubbing, cyanosis or edema.   Neuro: Alert and oriented X 3. Psych:  Good affect, responds  appropriately    LABS: Basic Metabolic Panel: Recent Labs    01/22/21 0632 01/22/21 0807  NA 138  --   K 4.7  --   CL 104  --   CO2 25  --   GLUCOSE 85  --   BUN 30*  --   CREATININE 1.26*  --   CALCIUM 9.1  --   MG  --  2.2   Liver Function Tests: Recent Labs    01/22/21 0632  AST 5*  ALT 10  ALKPHOS 58  BILITOT 1.6*  PROT 7.1  ALBUMIN 3.8   Recent Labs    01/22/21 0632  LIPASE 34   CBC: Recent Labs    01/22/21 0632 01/23/21 0541  WBC 7.7 7.4  NEUTROABS 4.5  --   HGB 15.3 13.9  HCT 44.7 40.0  MCV 94.5 92.4  PLT 194 242   Cardiac Enzymes: No results for input(s): CKTOTAL, CKMB, CKMBINDEX, TROPONINI in the last 72 hours. BNP: Invalid input(s): POCBNP D-Dimer: No results for input(s): DDIMER in the last 72 hours. Hemoglobin A1C: No results for input(s): HGBA1C in the last 72 hours. Fasting Lipid Panel: Recent Labs    01/23/21 0541  CHOL 225*  HDL 103  LDLCALC 111*  TRIG 56  CHOLHDL 2.2   Thyroid Function Tests: Recent Labs    01/23/21 0541  TSH 3.801   Anemia Panel: No results for input(s): VITAMINB12, FOLATE, FERRITIN, TIBC, IRON, RETICCTPCT in  the last 72 hours.  DG Chest Port 1 View  Result Date: 01/22/2021 CLINICAL DATA:  87 year old male with chest pain which woke him at 0500 hours. Atrial fibrillation. EXAM: PORTABLE CHEST 1 VIEW COMPARISON:  CT Abdomen and Pelvis 04/23/2020. FINDINGS: Portable AP upright view at 0645 hours. Borderline to mild cardiomegaly appears stable. Other mediastinal contours are within normal limits. Visualized tracheal air column is within normal limits. Allowing for portable technique the lungs are clear. No pneumothorax. Right shoulder arthroplasty with heterogeneous lucency about the visible hardware, suggestive of particle disease. Advanced left glenohumeral degeneration. No acute osseous abnormality identified. Negative visible bowel gas pattern. IMPRESSION: No acute cardiopulmonary abnormality.  Electronically Signed   By: Odessa Fleming M.D.   On: 01/22/2021 06:55     Echo: IMPRESSIONS   1. Left ventricular ejection fraction, by estimation, is 60 to 65%. The  left ventricle has normal function. The left ventricle has no regional  wall motion abnormalities. Left ventricular diastolic parameters were  normal.   2. Right ventricular systolic function is normal. The right ventricular  size is normal.   3. Left atrial size was mildly dilated.   4. Right atrial size was mildly dilated.   5. The mitral valve is abnormal. Mild mitral valve regurgitation. There  is mild prolapse of the medial segment of the anterior leaflet of the  mitral valve.   6. The aortic valve is tricuspid. Aortic valve regurgitation is mild.   TELEMETRY: atrial fibrillation  ASSESSMENT AND PLAN:  Principal Problem:   Chest pain Active Problems:   CKD (chronic kidney disease), stage IIIb   COPD (chronic obstructive pulmonary disease) (HCC)   Coronary artery disease   Hypertension   Atrial fibrillation with RVR (HCC)   Polymyalgia rheumatica (HCC)    Atrial fibrillation with RVR, the patient previously converted back into NSR but he is currently back in atrial fibrillation with a controlled rate Agree with continuous telemetry monitoring until discharge. We will continue the patient on amiodarone 200 mg twice daily for 1 week.  We will then continue amiodarone 200 mg once daily thereafter. We will continue Eliquis 5 mg twice daily for stroke risk reduction. We will schedule follow up visit as an outpatient with Dr. Juliann Pares in 1 week.  The Echocardiogram is unremarkable.  The patient is cleared for discharge from a cardiac perspective and should follow up with our office in 1 week as outpatient.   CAD, reasonably stable, patient denies chest pain at this time Aspirin not recommended at this time due to starting anticoagulation with eliquis and increased risk of bleeding in the elderly.  Patient is a poor  beta-blocker candidate due to bradycardia. Recommend following heart healthy diet.  Hypertension, reasonable control, patient is normotensive on today Recommend continuing amlodipine and losartan therapy. Recommend following a low-sodium diet. Continuing to monitor vital signs per protocol.  CKD stage IIIb, reasonably stable Agree with current management.  COPD, not exacerbated Agree with conservative management this time.   The patient's history, physical exam findings and plan of care were all discussed with Dr. Gerda Diss D. Callwood, whom also evaluated the patient, and all decision making was made in collaboration with him.   Caleb Khan, ACNPC-AG  01/23/2021 8:38 AM

## 2021-01-23 NOTE — Progress Notes (Signed)
*  PRELIMINARY RESULTS* Echocardiogram 2D Echocardiogram has been performed.  Caleb Khan 01/23/2021, 9:59 AM

## 2021-01-23 NOTE — Discharge Summary (Signed)
Physician Discharge Summary  Patient ID: Caleb Khan MRN: [id number] DOB/AGE: 1928/06/01 87 y.o.  Admit date: 01/22/2021 Discharge date: 01/23/2021  Admission Diagnoses:  Discharge Diagnoses:  Principal Problem:   Chest pain Active Problems:   CKD (chronic kidney disease), stage IIIb   COPD (chronic obstructive pulmonary disease) (HCC)   Coronary artery disease   Hypertension   Atrial fibrillation with RVR (HCC)   Polymyalgia rheumatica (HCC)   Discharged Condition: good  Hospital Course:  Caleb Khan is a 87 y.o. male with medical history significant of HTN, COPD, PMR on chronic prednisone, CAD, CKD-IIIb, spinal stenosis, who presents with chest pain.  He was also found to have atrial fibrillation with rapid ventricular response with heart rate of 140.  He is seen by cardiology,  his troponin was not elevated.  He was given Cardizem IV, he was also started on amnio oral.  He converted to sinus this morning. At this point, he is medically stable to be discharged.  We will continue amiodarone, Eliquis.  Patient will be followed with PCP and cardiology in the near future.    Consults: cardiology  Significant Diagnostic Studies:   Echo: 1. Left ventricular ejection fraction, by estimation, is 60 to 65%. The left ventricle has normal function. The left ventricle has no regional wall motion abnormalities. Left ventricular diastolic parameters were normal. 2. Right ventricular systolic function is normal. The right ventricular size is normal. 3. Left atrial size was mildly dilated. 4. Right atrial size was mildly dilated. 5. The mitral valve is abnormal. Mild mitral valve regurgitation. There is mild prolapse of the medial segment of the anterior leaflet of the mitral valve. 6. The aortic valve is tricuspid. Aortic valve regurgitation is mild.  Treatments: Eliquis, amio  Discharge Exam: Blood pressure 99/61, pulse 64, temperature (!) 97.5 F (36.4 C), temperature source  Oral, resp. rate 19, height 5\' 3"  (1.6 m), weight 63.3 kg, SpO2 99 %. General appearance: alert and cooperative Resp: clear to auscultation bilaterally Cardio: regular rate and rhythm, S1, S2 normal, no murmur, click, rub or gallop GI: soft, non-tender; bowel sounds normal; no masses,  no organomegaly Extremities: extremities normal, atraumatic, no cyanosis or edema  Disposition: Discharge disposition: 01-Home or Self Care       Discharge Instructions     Diet - low sodium heart healthy   Complete by: As directed    Increase activity slowly   Complete by: As directed       Allergies as of 01/23/2021       Reactions   Benazepril Cough        Medication List     STOP taking these medications    tamsulosin 0.4 MG Caps capsule Commonly known as: FLOMAX       TAKE these medications    acetaminophen 500 MG tablet Commonly known as: TYLENOL Take 1,000 mg by mouth every 8 (eight) hours as needed (pain).   amiodarone 200 MG tablet Commonly known as: PACERONE Take 1 tablet (200 mg total) by mouth 2 (two) times daily.   amLODipine 10 MG tablet Commonly known as: NORVASC Take 10 mg by mouth daily.   apixaban 5 MG Tabs tablet Commonly known as: ELIQUIS Take 1 tablet (5 mg total) by mouth 2 (two) times daily.   aspirin EC 81 MG tablet Take 81 mg by mouth daily.   Calcium Carb-Cholecalciferol 500-400 MG-UNIT Tabs Take 1 tablet by mouth daily.   docusate sodium 100 MG capsule Commonly known as: COLACE Take 100  mg by mouth every evening.   gabapentin 100 MG capsule Commonly known as: NEURONTIN Take 100 mg by mouth daily.   isosorbide mononitrate 30 MG 24 hr tablet Commonly known as: IMDUR Take 30 mg by mouth daily.   losartan 50 MG tablet Commonly known as: COZAAR Take 50 mg by mouth daily.   METAMUCIL FIBER PO Take by mouth.   MULTIVITAMIN PO Take 1 tablet by mouth daily.   nitroGLYCERIN 0.4 MG SL tablet Commonly known as: NITROSTAT Place 0.4  mg under the tongue every 5 (five) minutes as needed for chest pain.   predniSONE 10 MG tablet Commonly known as: DELTASONE Take 10 mg by mouth daily with breakfast.   SYSTANE ICAPS AREDS2 PO Take 1 capsule by mouth in the morning and at bedtime.   zolpidem 5 MG tablet Commonly known as: AMBIEN Take 5 mg by mouth at bedtime as needed for sleep.        Follow-up Information     Marisue Ivan, MD Follow up in 1 week(s).   Specialty: Family Medicine Contact information: [address] [facility] [place] [address] [phone]         Dalia Heading, MD Follow up in 2 week(s).   Specialty: Cardiology Contact information: [address] [place] [address] [phone]                 Signed: Marrion Coy 01/23/2021, 1:37 PM

## 2021-01-23 NOTE — Progress Notes (Signed)
   01/23/21 0940  Clinical Encounter Type  Visited With Patient  Visit Type Initial;Social support;Spiritual support  Referral From Other (Comment) (rounding)  Spiritual Encounters  Spiritual Needs Other (Comment) (social support)  Chaplain Burris met Caleb Khan who was sitting up in bed, alert and looking comfortable. He shared that he expected to be discharged today. Chaplain Burris inquired about things he might be looking forward to and we talked about his family and what their plans might include. Caleb Khan looks fit and was certainly engaged, so Caleb Khan encouraged him in having good days ahead.

## 2021-01-23 NOTE — Progress Notes (Signed)
Discharge instructions explained to pt/ verbalized an understanding/ iv and tele removed/ will transport off unit via wheelchair.  

## 2021-07-07 ENCOUNTER — Encounter: Payer: Self-pay | Admitting: Emergency Medicine

## 2021-07-07 ENCOUNTER — Inpatient Hospital Stay
Admission: EM | Admit: 2021-07-07 | Discharge: 2021-07-12 | DRG: 521 | Disposition: A | Discharge status: Home Health | Payer: Medicare PPO | Attending: Internal Medicine | Admitting: Internal Medicine

## 2021-07-07 ENCOUNTER — Emergency Department: Payer: Medicare PPO

## 2021-07-07 ENCOUNTER — Other Ambulatory Visit: Payer: Self-pay

## 2021-07-07 DIAGNOSIS — Z9079 Acquired absence of other genital organ(s): Secondary | ICD-10-CM

## 2021-07-07 DIAGNOSIS — I10 Essential (primary) hypertension: Secondary | ICD-10-CM | POA: Diagnosis present

## 2021-07-07 DIAGNOSIS — J449 Chronic obstructive pulmonary disease, unspecified: Secondary | ICD-10-CM | POA: Diagnosis present

## 2021-07-07 DIAGNOSIS — I959 Hypotension, unspecified: Secondary | ICD-10-CM | POA: Diagnosis not present

## 2021-07-07 DIAGNOSIS — Z66 Do not resuscitate: Secondary | ICD-10-CM | POA: Diagnosis present

## 2021-07-07 DIAGNOSIS — M4800 Spinal stenosis, site unspecified: Secondary | ICD-10-CM | POA: Diagnosis present

## 2021-07-07 DIAGNOSIS — I482 Chronic atrial fibrillation, unspecified: Secondary | ICD-10-CM | POA: Diagnosis present

## 2021-07-07 DIAGNOSIS — E8770 Fluid overload, unspecified: Secondary | ICD-10-CM | POA: Diagnosis not present

## 2021-07-07 DIAGNOSIS — I1310 Hypertensive heart and chronic kidney disease without heart failure, with stage 1 through stage 4 chronic kidney disease, or unspecified chronic kidney disease: Secondary | ICD-10-CM | POA: Diagnosis present

## 2021-07-07 DIAGNOSIS — Z79899 Other long term (current) drug therapy: Secondary | ICD-10-CM

## 2021-07-07 DIAGNOSIS — M353 Polymyalgia rheumatica: Secondary | ICD-10-CM | POA: Diagnosis present

## 2021-07-07 DIAGNOSIS — J9601 Acute respiratory failure with hypoxia: Secondary | ICD-10-CM | POA: Diagnosis not present

## 2021-07-07 DIAGNOSIS — W010XXA Fall on same level from slipping, tripping and stumbling without subsequent striking against object, initial encounter: Secondary | ICD-10-CM | POA: Diagnosis present

## 2021-07-07 DIAGNOSIS — Z9852 Vasectomy status: Secondary | ICD-10-CM

## 2021-07-07 DIAGNOSIS — Y92512 Supermarket, store or market as the place of occurrence of the external cause: Secondary | ICD-10-CM

## 2021-07-07 DIAGNOSIS — R0902 Hypoxemia: Secondary | ICD-10-CM

## 2021-07-07 DIAGNOSIS — S72011A Unspecified intracapsular fracture of right femur, initial encounter for closed fracture: Secondary | ICD-10-CM | POA: Diagnosis present

## 2021-07-07 DIAGNOSIS — Z7982 Long term (current) use of aspirin: Secondary | ICD-10-CM | POA: Diagnosis not present

## 2021-07-07 DIAGNOSIS — Z7901 Long term (current) use of anticoagulants: Secondary | ICD-10-CM | POA: Diagnosis not present

## 2021-07-07 DIAGNOSIS — M1990 Unspecified osteoarthritis, unspecified site: Secondary | ICD-10-CM | POA: Diagnosis present

## 2021-07-07 DIAGNOSIS — S72001A Fracture of unspecified part of neck of right femur, initial encounter for closed fracture: Secondary | ICD-10-CM | POA: Diagnosis not present

## 2021-07-07 DIAGNOSIS — N179 Acute kidney failure, unspecified: Secondary | ICD-10-CM | POA: Diagnosis present

## 2021-07-07 DIAGNOSIS — W19XXXA Unspecified fall, initial encounter: Secondary | ICD-10-CM | POA: Diagnosis not present

## 2021-07-07 DIAGNOSIS — E871 Hypo-osmolality and hyponatremia: Secondary | ICD-10-CM | POA: Diagnosis present

## 2021-07-07 DIAGNOSIS — Z7952 Long term (current) use of systemic steroids: Secondary | ICD-10-CM | POA: Diagnosis not present

## 2021-07-07 DIAGNOSIS — S72009A Fracture of unspecified part of neck of unspecified femur, initial encounter for closed fracture: Secondary | ICD-10-CM | POA: Diagnosis present

## 2021-07-07 DIAGNOSIS — I2510 Atherosclerotic heart disease of native coronary artery without angina pectoris: Secondary | ICD-10-CM | POA: Diagnosis present

## 2021-07-07 DIAGNOSIS — S5001XA Contusion of right elbow, initial encounter: Secondary | ICD-10-CM | POA: Diagnosis present

## 2021-07-07 DIAGNOSIS — N183 Chronic kidney disease, stage 3 unspecified: Secondary | ICD-10-CM | POA: Diagnosis present

## 2021-07-07 DIAGNOSIS — Z87891 Personal history of nicotine dependence: Secondary | ICD-10-CM

## 2021-07-07 DIAGNOSIS — Z888 Allergy status to other drugs, medicaments and biological substances status: Secondary | ICD-10-CM

## 2021-07-07 DIAGNOSIS — Z20822 Contact with and (suspected) exposure to covid-19: Secondary | ICD-10-CM | POA: Diagnosis present

## 2021-07-07 DIAGNOSIS — Z8249 Family history of ischemic heart disease and other diseases of the circulatory system: Secondary | ICD-10-CM

## 2021-07-07 DIAGNOSIS — N1832 Chronic kidney disease, stage 3b: Secondary | ICD-10-CM | POA: Diagnosis present

## 2021-07-07 DIAGNOSIS — Z419 Encounter for procedure for purposes other than remedying health state, unspecified: Secondary | ICD-10-CM

## 2021-07-07 LAB — CBC WITH DIFFERENTIAL/PLATELET
Abs Immature Granulocytes: 0.07 10*3/uL (ref 0.00–0.07)
Basophils Absolute: 0 10*3/uL (ref 0.0–0.1)
Basophils Relative: 0 %
Eosinophils Absolute: 0 10*3/uL (ref 0.0–0.5)
Eosinophils Relative: 0 %
HCT: 40.3 % (ref 39.0–52.0)
Hemoglobin: 13.6 g/dL (ref 13.0–17.0)
Immature Granulocytes: 1 %
Lymphocytes Relative: 7 %
Lymphs Abs: 1 10*3/uL (ref 0.7–4.0)
MCH: 32 pg (ref 26.0–34.0)
MCHC: 33.7 g/dL (ref 30.0–36.0)
MCV: 94.8 fL (ref 80.0–100.0)
Monocytes Absolute: 0.7 10*3/uL (ref 0.1–1.0)
Monocytes Relative: 5 %
Neutro Abs: 13.2 10*3/uL — ABNORMAL HIGH (ref 1.7–7.7)
Neutrophils Relative %: 87 %
Platelets: 260 10*3/uL (ref 150–400)
RBC: 4.25 MIL/uL (ref 4.22–5.81)
RDW: 14.9 % (ref 11.5–15.5)
WBC: 15.1 10*3/uL — ABNORMAL HIGH (ref 4.0–10.5)
nRBC: 0 % (ref 0.0–0.2)

## 2021-07-07 LAB — BASIC METABOLIC PANEL
Anion gap: 9 (ref 5–15)
BUN: 34 mg/dL — ABNORMAL HIGH (ref 8–23)
CO2: 23 mmol/L (ref 22–32)
Calcium: 9.1 mg/dL (ref 8.9–10.3)
Chloride: 103 mmol/L (ref 98–111)
Creatinine, Ser: 1.38 mg/dL — ABNORMAL HIGH (ref 0.61–1.24)
GFR, Estimated: 48 mL/min — ABNORMAL LOW (ref >60)
Glucose, Bld: 95 mg/dL (ref 70–99)
Potassium: 4.5 mmol/L (ref 3.5–5.1)
Sodium: 135 mmol/L (ref 135–145)

## 2021-07-07 LAB — RESP PANEL BY RT-PCR (FLU A&B, COVID) ARPGX2
Influenza A by PCR: NEGATIVE
Influenza B by PCR: NEGATIVE
SARS Coronavirus 2 by RT PCR: NEGATIVE

## 2021-07-07 MED ORDER — PSYLLIUM 95 % PO PACK
1.0000 | PACK | Freq: Every day | ORAL | Status: DC
  Administered 2021-07-08 – 2021-07-12 (×4): 1 via ORAL
  Filled 2021-07-07 (×5): qty 1

## 2021-07-07 MED ORDER — MORPHINE SULFATE (PF) 2 MG/ML IV SOLN: 0.5000 mg | INTRAVENOUS | Status: DC | PRN

## 2021-07-07 MED ORDER — HYDROCODONE-ACETAMINOPHEN 5-325 MG PO TABS
1.0000 | ORAL_TABLET | Freq: Four times a day (QID) | ORAL | Status: DC | PRN
  Administered 2021-07-07 – 2021-07-09 (×3): 2 via ORAL
  Administered 2021-07-10 (×2): 1 via ORAL
  Filled 2021-07-07 (×2): qty 2
  Filled 2021-07-07: qty 1
  Filled 2021-07-07 (×2): qty 2
  Filled 2021-07-07 (×2): qty 1

## 2021-07-07 MED ORDER — METHOCARBAMOL 500 MG PO TABS
500.0000 mg | ORAL_TABLET | Freq: Four times a day (QID) | ORAL | Status: DC | PRN
  Administered 2021-07-09 – 2021-07-10 (×2): 500 mg via ORAL
  Filled 2021-07-07 (×4): qty 1

## 2021-07-07 MED ORDER — ISOSORBIDE MONONITRATE ER 60 MG PO TB24: 30.0000 mg | ORAL_TABLET | Freq: Every day | ORAL | Status: DC

## 2021-07-07 MED ORDER — NITROGLYCERIN 0.4 MG SL SUBL: 0.4000 mg | SUBLINGUAL_TABLET | SUBLINGUAL | Status: DC | PRN

## 2021-07-07 MED ORDER — OYSTER SHELL CALCIUM/D3 500-5 MG-MCG PO TABS
1.0000 | ORAL_TABLET | Freq: Every day | ORAL | Status: DC
  Administered 2021-07-08 – 2021-07-12 (×5): 1 via ORAL
  Filled 2021-07-07 (×5): qty 1

## 2021-07-07 MED ORDER — AMIODARONE HCL 200 MG PO TABS: 200.0000 mg | ORAL_TABLET | Freq: Two times a day (BID) | ORAL | Status: DC

## 2021-07-07 MED ORDER — AMLODIPINE BESYLATE 10 MG PO TABS
10.0000 mg | ORAL_TABLET | Freq: Every day | ORAL | Status: DC
  Administered 2021-07-08 – 2021-07-12 (×5): 10 mg via ORAL
  Filled 2021-07-07: qty 2
  Filled 2021-07-07 (×2): qty 1
  Filled 2021-07-07: qty 2
  Filled 2021-07-07: qty 1

## 2021-07-07 MED ORDER — SENNOSIDES-DOCUSATE SODIUM 8.6-50 MG PO TABS: 2.0000 | ORAL_TABLET | Freq: Every day | ORAL | Status: DC

## 2021-07-07 MED ORDER — LOSARTAN POTASSIUM 50 MG PO TABS
50.0000 mg | ORAL_TABLET | Freq: Every day | ORAL | Status: DC
  Administered 2021-07-07 – 2021-07-10 (×3): 50 mg via ORAL
  Filled 2021-07-07 (×5): qty 1

## 2021-07-07 MED ORDER — ACETAMINOPHEN 500 MG PO TABS: 1000.0000 mg | ORAL_TABLET | Freq: Three times a day (TID) | ORAL | Status: DC | PRN

## 2021-07-07 MED ORDER — METHOCARBAMOL 1000 MG/10ML IJ SOLN
500.0000 mg | Freq: Four times a day (QID) | INTRAVENOUS | Status: DC | PRN
  Filled 2021-07-07: qty 5

## 2021-07-07 MED ORDER — PREDNISONE 20 MG PO TABS: 10.0000 mg | ORAL_TABLET | Freq: Every day | ORAL | Status: DC

## 2021-07-07 MED ORDER — DOCUSATE SODIUM 100 MG PO CAPS
100.0000 mg | ORAL_CAPSULE | Freq: Every evening | ORAL | Status: DC
  Administered 2021-07-08 – 2021-07-09 (×2): 100 mg via ORAL
  Filled 2021-07-07 (×2): qty 1

## 2021-07-07 MED ORDER — ADULT MULTIVITAMIN W/MINERALS CH
1.0000 | ORAL_TABLET | Freq: Every day | ORAL | Status: DC
  Administered 2021-07-08 – 2021-07-12 (×5): 1 via ORAL
  Filled 2021-07-07 (×5): qty 1

## 2021-07-07 MED ORDER — PREDNISONE 10 MG PO TABS
5.0000 mg | ORAL_TABLET | Freq: Every day | ORAL | Status: DC
  Administered 2021-07-08 – 2021-07-12 (×5): 5 mg via ORAL
  Filled 2021-07-07 (×5): qty 1

## 2021-07-07 MED ORDER — ZOLPIDEM TARTRATE 5 MG PO TABS
5.0000 mg | ORAL_TABLET | Freq: Every evening | ORAL | Status: DC | PRN
  Administered 2021-07-07 – 2021-07-10 (×3): 5 mg via ORAL
  Filled 2021-07-07 (×3): qty 1

## 2021-07-07 MED ORDER — AMIODARONE HCL 200 MG PO TABS
100.0000 mg | ORAL_TABLET | Freq: Every day | ORAL | Status: DC
  Administered 2021-07-08 – 2021-07-12 (×5): 100 mg via ORAL
  Filled 2021-07-07 (×5): qty 1

## 2021-07-07 MED ORDER — OCUVITE-LUTEIN PO CAPS
1.0000 | ORAL_CAPSULE | Freq: Every day | ORAL | Status: DC
  Administered 2021-07-08 – 2021-07-11 (×4): 1 via ORAL
  Filled 2021-07-07 (×4): qty 1

## 2021-07-07 MED ORDER — GABAPENTIN 100 MG PO CAPS
100.0000 mg | ORAL_CAPSULE | Freq: Every day | ORAL | Status: DC
  Administered 2021-07-07 – 2021-07-12 (×6): 100 mg via ORAL
  Filled 2021-07-07 (×6): qty 1

## 2021-07-07 NOTE — ED Triage Notes (Signed)
C/O fall this morning about 1 hour PTA.  States lost balance and fell at store onto concrete floor.  C/O right hip pain.  Assisted patient out of car.  Able to bear weight but c/o pain to hip.

## 2021-07-07 NOTE — ED Notes (Signed)
Pt attempted to get up to void in urinal as he had before with help of wife. Pt feels dizzy. Pt laying in bed now. Told him will get help to reposition him in bed and apply promofit.

## 2021-07-07 NOTE — ED Notes (Signed)
Pt repositioned in bed, promofit applied, VS obtained. Warm blankets applied.

## 2021-07-07 NOTE — Consult Note (Addendum)
ORTHOPAEDIC CONSULTATION  REQUESTING PHYSICIAN: Flora Lipps, MD  Chief Complaint: right hip pain  HPI: Caleb Khan is a 87 y.o. male who complains of right hip pain after fall. The pain is sharp in character. The pain is severe and 8/10. The pain is worse with movement and better with rest. Denies any numbness, tingling or constitutional symptoms.  Past Medical History:  Diagnosis Date   Anemia    Anginal pain (HCC)    Arthritis    osteoarthritis   Chronic kidney disease    stage 3   COPD (chronic obstructive pulmonary disease) (HCC)    NO INHALERS   Coronary artery disease    Dyspnea    with exertion   Hypertension    Polymyalgia rheumatica ([place])    TAKES PREDNISONE   Spinal stenosis    Past Surgical History:  Procedure Laterality Date   CARDIAC CATHETERIZATION Left 05/22/2016   Procedure: Left Heart Cath and Coronary Angiography;  Surgeon: Yolonda Kida, MD;  Location: [facility];  Service: Cardiovascular;  Laterality: Left;   EYE SURGERY Bilateral    Cataract Extraction with IOL   GREEN LIGHT LASER TURP (TRANSURETHRAL RESECTION OF PROSTATE N/A 04/26/2018   Procedure: GREEN LIGHT LASER TURP (TRANSURETHRAL RESECTION OF PROSTATE;  Surgeon: Royston Cowper, MD;  Location: [facility];  Service: Urology;  Laterality: N/A;   INTRAVASCULAR PRESSURE WIRE/FFR STUDY N/A 09/15/2018   Procedure: INTRAVASCULAR PRESSURE WIRE/FFR STUDY;  Surgeon: Yolonda Kida, MD;  Location: [facility];  Service: Cardiovascular;  Laterality: N/A;   JOINT REPLACEMENT Right 2004   Shoulder Replacement   JOINT REPLACEMENT Left 2007   Ankle Replacement   LEFT HEART CATH AND CORONARY ANGIOGRAPHY Left 09/15/2018   Procedure: LEFT HEART CATH AND CORONARY ANGIOGRAPHY;  Surgeon: Yolonda Kida, MD;  Location: [facility];  Service: Cardiovascular;  Laterality: Left;   PATELLAR TENDON REPAIR Left 07/15/2017   Procedure: PATELLA TENDON REPAIR;  Surgeon: Hessie Knows, MD;  Location: [facility];  Service: Orthopedics;  Laterality: Left;   QUADRICEPS TENDON REPAIR Left 07/15/2017   Procedure: REPAIR QUADRICEP TENDON;  Surgeon: Hessie Knows, MD;  Location: [facility];  Service: Orthopedics;  Laterality: Left;   TONSILLECTOMY     VASECTOMY     Social History   Socioeconomic History   Marital status: Married    Spouse name: Not on file   Number of children: Not on file   Years of education: Not on file   Highest education level: Not on file  Occupational History   Not on file  Tobacco Use   Smoking status: Former    Packs/day: 2.00    Years: 22.00    Pack years: 44.00    Types: Cigarettes    Quit date: 05/22/1972    Years since quitting: 49.1   Smokeless tobacco: Never  Vaping Use   Vaping Use: Never used  Substance and Sexual Activity   Alcohol use: Yes    Alcohol/week: 7.0 standard drinks    Types: 7 Glasses of wine per week    Comment: daily   Drug use: No   Sexual activity: Not on file  Other Topics Concern   Not on file  Social History Narrative   Not on file   Social Determinants of Health   Financial Resource Strain: Not on file  Food Insecurity: Not on file  Transportation Needs: Not on file  Physical Activity: Not on file  Stress: Not on file  Social  Connections: Not on file   Family History  Problem Relation Age of Onset   Hypertension Mother    Allergies  Allergen Reactions   Benazepril Cough   Prior to Admission medications   Medication Sig Start Date End Date Taking? Authorizing Provider  amiodarone (PACERONE) 200 MG tablet Take 1 tablet (200 mg total) by mouth 2 (two) times daily. Patient taking differently: Take 100 mg by mouth daily. 01/23/21  Yes Sharen Hones, MD  amLODipine (NORVASC) 10 MG tablet Take 10 mg by mouth daily. 01/01/21  Yes [provider]  apixaban (ELIQUIS) 5 MG TABS tablet Take 1 tablet (5 mg total) by mouth 2 (two) times daily. Patient taking differently: Take 2.5 mg by mouth 2  (two) times daily. 01/23/21  Yes Sharen Hones, MD  gabapentin (NEURONTIN) 100 MG capsule Take 100 mg by mouth daily. 11/27/20  Yes [provider]  losartan (COZAAR) 50 MG tablet Take 50 mg by mouth daily. 01/01/21  Yes [provider]  predniSONE (DELTASONE) 10 MG tablet Take 5 mg by mouth daily with breakfast.   Yes [provider]  rosuvastatin (CRESTOR) 5 MG tablet Take 5 mg by mouth daily.   Yes [provider]  zolpidem (AMBIEN) 5 MG tablet Take 5 mg by mouth at bedtime as needed for sleep.   Yes [provider]  acetaminophen (TYLENOL) 500 MG tablet Take 1,000 mg by mouth every 8 (eight) hours as needed (pain).    [provider]  aspirin EC 81 MG tablet Take 81 mg by mouth daily. Patient not taking: Reported on 07/07/2021    [provider]  Calcium Carb-Cholecalciferol 500-400 MG-UNIT TABS Take 1 tablet by mouth daily.    [provider]  docusate sodium (COLACE) 100 MG capsule Take 100 mg by mouth every evening.    [provider]  isosorbide mononitrate (IMDUR) 30 MG 24 hr tablet Take 30 mg by mouth daily. Patient not taking: Reported on 07/07/2021 12/11/20   [provider]  METAMUCIL FIBER PO Take by mouth.    [provider]  Multiple Vitamins-Minerals (MULTIVITAMIN PO) Take 1 tablet by mouth daily.    [provider]  Multiple Vitamins-Minerals (SYSTANE ICAPS AREDS2 PO) Take 1 capsule by mouth in the morning and at bedtime. 11/25/19   [provider]  nitroGLYCERIN (NITROSTAT) 0.4 MG SL tablet Place 0.4 mg under the tongue every 5 (five) minutes as needed for chest pain.    [provider]   DG Elbow Complete Right  Result Date: 07/07/2021 CLINICAL DATA:  Right elbow pain after a fall. EXAM: RIGHT ELBOW - COMPLETE 3+ VIEW COMPARISON:  None. FINDINGS: No acute fracture or dislocation. No joint effusion. Mild degenerative changes with joint space narrowing and  marginal osteophytes. Soft tissues are unremarkable. IMPRESSION: 1. No acute osseous abnormality. 2. Mild osteoarthritis. Electronically Signed   By: Titus Dubin M.D.   On: 07/07/2021 13:53   DG Hip Unilat W or Wo Pelvis 2-3 Views Right  Result Date: 07/07/2021 CLINICAL DATA:  Right hip pain after fall. EXAM: DG HIP (WITH OR WITHOUT PELVIS) 2-3V RIGHT COMPARISON:  CT abdomen pelvis dated April 03, 2020. FINDINGS: Acute mildly impacted right femoral neck fracture. No dislocation. The pubic symphysis and sacroiliac joints are intact. IMPRESSION: 1. Acute mildly impacted right femoral neck fracture. Electronically Signed   By: Titus Dubin M.D.   On: 07/07/2021 13:55    Positive ROS: All other systems have been reviewed and were otherwise  negative with the exception of those mentioned in the HPI and as above.  Physical Exam: General: Alert, no acute distress Cardiovascular: No pedal edema Respiratory: No cyanosis, no use of accessory musculature GI: No organomegaly, abdomen is soft and non-tender Skin: No lesions in the area of chief complaint Neurologic: Sensation intact distally Psychiatric: Patient is competent for consent with normal mood and affect Lymphatic: No axillary or cervical lymphadenopathy  MUSCULOSKELETAL: right hip short, externally roatated. Compartments soft. Good cap refill. Motor and sensory intact distally.  Assessment: Right hip fracture  Plan: Right hip hemiarthroplasty tomorrow. NPO after midnight.  The diagnosis, risks, benefits and alternatives to treatment are all discussed in detail with the patient and family. Risks include but are not limited to bleeding, infection, deep vein thrombosis, pulmonary embolism, nerve or vascular injury, non-union, repeat operation, persistent pain, weakness, stiffness and death. He understands and is eager to proceed.     Lyndle Herrlich, MD    07/08/2021 1:17 PM

## 2021-07-07 NOTE — H&P (Addendum)
History and Physical    Caleb Khan WNI:[id number] DOB: 1928-03-20 DOA: 07/07/2021  PCP: Marisue Ivan, MD   Patient coming from: Home  I have personally briefly reviewed patient's old medical records in [facility]  Chief Complaint: Right hip pain  HPI: Caleb Khan is a 87 y.o. male with medical history significant for spinal stenosis, stage III chronic kidney disease, coronary artery disease, A. fib on anticoagulation who presents to the ER for evaluation after he fell at the hardware store.  He said he tripped on his cart and fell landing on his right hip.  He was unable to bear weight on his right lower extremity and so EMS was called.  He also complains of pain to his right elbow. He denies feeling dizzy or lightheaded and denies hitting his head.  There was no loss of consciousness. He denies having any chest pain, no shortness of breath, no nausea, no vomiting, no changes in his bowel habits, no palpitations, no diaphoresis, no leg swelling, no urinary symptoms, no fever, no chills, no cough, no blurred vision no focal deficit. White count 15, hemoglobin 13.6, hematocrit 40.3, MCV 94.8, RDW 14.9, platelet count 260 Right hip x-ray shows acute mildly impacted right femoral neck fracture Right elbow x-ray shows no acute osseous abnormality.  Mild osteoarthritis. Twelve-lead EKG reviewed by me shows atrial fibrillation    ED Course: Patient is a 87 year old male who presents to the ER for evaluation of right hip pain following a fall and is found to have an acute mildly impacted right femoral neck fracture. He will be admitted to the hospital for further evaluation.   Review of Systems: As per HPI otherwise all other systems reviewed and negative.    Past Medical History:  Diagnosis Date   Anemia    Anginal pain (HCC)    Arthritis    osteoarthritis   Chronic kidney disease    stage 3   COPD (chronic obstructive pulmonary disease) (HCC)    NO INHALERS    Coronary artery disease    Dyspnea    with exertion   Hypertension    Polymyalgia rheumatica (HCC)    TAKES PREDNISONE   Spinal stenosis     Past Surgical History:  Procedure Laterality Date   CARDIAC CATHETERIZATION Left 05/22/2016   Procedure: Left Heart Cath and Coronary Angiography;  Surgeon: Alwyn Pea, MD;  Location: [facility] INVASIVE CV [facility];  Service: Cardiovascular;  Laterality: Left;   EYE SURGERY Bilateral    Cataract Extraction with IOL   GREEN LIGHT LASER TURP (TRANSURETHRAL RESECTION OF PROSTATE N/A 04/26/2018   Procedure: GREEN LIGHT LASER TURP (TRANSURETHRAL RESECTION OF PROSTATE;  Surgeon: Orson Ape, MD;  Location: [facility] ORS;  Service: Urology;  Laterality: N/A;   INTRAVASCULAR PRESSURE WIRE/FFR STUDY N/A 09/15/2018   Procedure: INTRAVASCULAR PRESSURE WIRE/FFR STUDY;  Surgeon: Alwyn Pea, MD;  Location: [facility] INVASIVE CV LAB;  Service: Cardiovascular;  Laterality: N/A;   JOINT REPLACEMENT Right 2004   Shoulder Replacement   JOINT REPLACEMENT Left 2007   Ankle Replacement   LEFT HEART CATH AND CORONARY ANGIOGRAPHY Left 09/15/2018   Procedure: LEFT HEART CATH AND CORONARY ANGIOGRAPHY;  Surgeon: Alwyn Pea, MD;  Location: [facility] INVASIVE CV LAB;  Service: Cardiovascular;  Laterality: Left;   PATELLAR TENDON REPAIR Left 07/15/2017   Procedure: PATELLA TENDON REPAIR;  Surgeon: Kennedy Bucker, MD;  Location: [facility] ORS;  Service: Orthopedics;  Laterality: Left;   QUADRICEPS TENDON REPAIR Left 07/15/2017   Procedure: REPAIR  QUADRICEP TENDON;  Surgeon: Hessie Knows, MD;  Location: [facility] ORS;  Service: Orthopedics;  Laterality: Left;   TONSILLECTOMY     VASECTOMY       reports that he quit smoking about 49 years ago. His smoking use included cigarettes. He has a 44.00 pack-year smoking history. He has never used smokeless tobacco. He reports current alcohol use of about 7.0 standard drinks per week. He reports that he does not use drugs.  Allergies  Allergen  Reactions   Benazepril Cough    Family History  Problem Relation Age of Onset   Hypertension Mother       Prior to Admission medications   Medication Sig Start Date End Date Taking? Authorizing Provider  acetaminophen (TYLENOL) 500 MG tablet Take 1,000 mg by mouth every 8 (eight) hours as needed (pain).    [provider]  amiodarone (PACERONE) 200 MG tablet Take 1 tablet (200 mg total) by mouth 2 (two) times daily. 01/23/21   Sharen Hones, MD  amLODipine (NORVASC) 10 MG tablet Take 10 mg by mouth daily. 01/01/21   [provider]  apixaban (ELIQUIS) 5 MG TABS tablet Take 1 tablet (5 mg total) by mouth 2 (two) times daily. 01/23/21   Sharen Hones, MD  aspirin EC 81 MG tablet Take 81 mg by mouth daily.    [provider]  Calcium Carb-Cholecalciferol 500-400 MG-UNIT TABS Take 1 tablet by mouth daily.    [provider]  docusate sodium (COLACE) 100 MG capsule Take 100 mg by mouth every evening.    [provider]  gabapentin (NEURONTIN) 100 MG capsule Take 100 mg by mouth daily. 11/27/20   [provider]  isosorbide mononitrate (IMDUR) 30 MG 24 hr tablet Take 30 mg by mouth daily. 12/11/20   [provider]  losartan (COZAAR) 50 MG tablet Take 50 mg by mouth daily. 01/01/21   [provider]  METAMUCIL FIBER PO Take by mouth.    [provider]  Multiple Vitamins-Minerals (MULTIVITAMIN PO) Take 1 tablet by mouth daily.    [provider]  Multiple Vitamins-Minerals (SYSTANE ICAPS AREDS2 PO) Take 1 capsule by mouth in the morning and at bedtime. 11/25/19   [provider]  nitroGLYCERIN (NITROSTAT) 0.4 MG SL tablet Place 0.4 mg under the tongue every 5 (five) minutes as needed for chest pain.    [provider]  predniSONE (DELTASONE) 10 MG tablet Take 10 mg by mouth daily with breakfast.    [provider]  zolpidem (AMBIEN) 5 MG tablet Take 5 mg by mouth at bedtime as needed for  sleep.    [provider]    Physical Exam: Vitals:   07/07/21 1120 07/07/21 1131 07/07/21 1134 07/07/21 1425  BP:  128/65  140/71  Pulse:  65 64 64  Resp:  17  16  Temp:  (!) 97.5 F (36.4 C)    TempSrc:  Oral    SpO2:  90% 97% 93%  Weight: 63.3 kg     Height: 5\' 3"  (1.6 m)        Vitals:   07/07/21 1120 07/07/21 1131 07/07/21 1134 07/07/21 1425  BP:  128/65  140/71  Pulse:  65 64 64  Resp:  17  16  Temp:  (!) 97.5 F (36.4 C)    TempSrc:  Oral    SpO2:  90% 97% 93%  Weight: 63.3 kg     Height: 5\' 3"  (1.6 m)  Constitutional: Alert and oriented x 3 . Not in any apparent distress HEENT:      Head: Normocephalic and atraumatic.         Eyes: PERLA, EOMI, Conjunctivae are normal. Sclera is non-icteric.       Mouth/Throat: Mucous membranes are moist.       Neck: Supple with no signs of meningismus. Cardiovascular: Irregularly irregular. No murmurs, gallops, or rubs. 2+ symmetrical distal pulses are present . No JVD. No LE edema Respiratory: Respiratory effort normal .Lungs sounds clear bilaterally. No wheezes, crackles, or rhonchi.  Gastrointestinal: Soft, non tender, and non distended with positive bowel sounds.  Genitourinary: No CVA tenderness. Musculoskeletal: Decreased range of motion right hip  Neurologic:  Face is symmetric. Moving all extremities. No gross focal neurologic deficits .  Able to move all extremities Skin: Skin is warm, dry.  No rash or ulcers Psychiatric: Mood and affect are normal    Labs on Admission: I have personally reviewed following labs and imaging studies  CBC: Recent Labs  Lab 07/07/21 1417  WBC 15.1*  NEUTROABS 13.2*  HGB 13.6  HCT 40.3  MCV 94.8  PLT 123456   Basic Metabolic Panel: Recent Labs  Lab 07/07/21 1417  NA 135  K 4.5  CL 103  CO2 23  GLUCOSE 95  BUN 34*  CREATININE 1.38*  CALCIUM 9.1   GFR: Estimated Creatinine Clearance: 28.1 mL/min (A) (by C-G formula based on SCr of 1.38 mg/dL  (H)). Liver Function Tests: No results for input(s): AST, ALT, ALKPHOS, BILITOT, PROT, ALBUMIN in the last 168 hours. No results for input(s): LIPASE, AMYLASE in the last 168 hours. No results for input(s): AMMONIA in the last 168 hours. Coagulation Profile: No results for input(s): INR, PROTIME in the last 168 hours. Cardiac Enzymes: No results for input(s): CKTOTAL, CKMB, CKMBINDEX, TROPONINI in the last 168 hours. BNP (last 3 results) No results for input(s): PROBNP in the last 8760 hours. HbA1C: No results for input(s): HGBA1C in the last 72 hours. CBG: No results for input(s): GLUCAP in the last 168 hours. Lipid Profile: No results for input(s): CHOL, HDL, LDLCALC, TRIG, CHOLHDL, LDLDIRECT in the last 72 hours. Thyroid Function Tests: No results for input(s): TSH, T4TOTAL, FREET4, T3FREE, THYROIDAB in the last 72 hours. Anemia Panel: No results for input(s): VITAMINB12, FOLATE, FERRITIN, TIBC, IRON, RETICCTPCT in the last 72 hours. Urine analysis:    Component Value Date/Time   COLORURINE YELLOW (A) 03/17/2018 0424   APPEARANCEUR HAZY (A) 03/17/2018 0424   LABSPEC 1.006 03/17/2018 0424   PHURINE 7.0 03/17/2018 0424   GLUCOSEU NEGATIVE 03/17/2018 0424   HGBUR LARGE (A) 03/17/2018 0424   BILIRUBINUR NEGATIVE 03/17/2018 0424   KETONESUR NEGATIVE 03/17/2018 0424   PROTEINUR 100 (A) 03/17/2018 0424   NITRITE POSITIVE (A) 03/17/2018 0424   LEUKOCYTESUR MODERATE (A) 03/17/2018 0424    Radiological Exams on Admission: DG Elbow Complete Right  Result Date: 07/07/2021 CLINICAL DATA:  Right elbow pain after a fall. EXAM: RIGHT ELBOW - COMPLETE 3+ VIEW COMPARISON:  None. FINDINGS: No acute fracture or dislocation. No joint effusion. Mild degenerative changes with joint space narrowing and marginal osteophytes. Soft tissues are unremarkable. IMPRESSION: 1. No acute osseous abnormality. 2. Mild osteoarthritis. Electronically Signed   By: Titus Dubin M.D.   On: 07/07/2021 13:53    DG Hip Unilat W or Wo Pelvis 2-3 Views Right  Result Date: 07/07/2021 CLINICAL DATA:  Right hip pain after fall. EXAM: DG HIP (WITH OR WITHOUT PELVIS) 2-3V  RIGHT COMPARISON:  CT abdomen pelvis dated April 03, 2020. FINDINGS: Acute mildly impacted right femoral neck fracture. No dislocation. The pubic symphysis and sacroiliac joints are intact. IMPRESSION: 1. Acute mildly impacted right femoral neck fracture. Electronically Signed   By: Titus Dubin M.D.   On: 07/07/2021 13:55     Assessment/Plan Principal Problem:   Femoral neck fracture (HCC) Active Problems:   CKD (chronic kidney disease), stage IIIb   COPD (chronic obstructive pulmonary disease) (HCC)   Coronary artery disease   Hypertension   Polymyalgia rheumatica (HCC)   Spinal stenosis   Fall     Patient is a 87 year old male admitted to the hospital for right femoral neck fracture.   Right femoral neck fracture Status post fall Immobilize right lower extremity Pain control Consult orthopedic surgery    Chronic atrial fibrillation Continue amiodarone for rate control Hold Eliquis for planned surgery    Coronary artery disease Hold aspirin Continue nitrates    Hypertension Blood pressure stable Continue amlodipine and losartan    History of polymyalgia rheumatica Continue daily prednisone therapy  DVT prophylaxis: SCD Code Status: DO NOT RESUSCITATE Family Communication: Greater than 50% of time was spent discussing patient's condition and plan of care with him at the bedside.  All questions and concerns have been addressed.  He verbalizes understanding and agrees to the plan.  CODE STATUS was discussed and he is a DNR. Disposition Plan: Back to previous home environment Consults called: Orthopedic surgery Status:At the time of admission, it appears that the appropriate admission status for this patient is inpatient. This is judged to be reasonable and necessary to provide the required  intensity of service to ensure the patient's safety given the presenting symptoms, physical exam findings, and initial radiographic and laboratory data in the context of their comorbid conditions. Patient requires inpatient status due to high intensity of service, high risk for further deterioration and high frequency of surveillance required.     Collier Bullock MD [facility]     07/07/2021, 3:08 PM

## 2021-07-07 NOTE — ED Provider Notes (Signed)
[facility] Emergency Department Provider Note ____________________________________________   Event Date/Time   First MD Initiated Contact with Patient 07/07/21 1224     (approximate)  I have reviewed the triage vital signs and the nursing notes.   HISTORY  Chief Complaint Hip Injury    HPI Caleb Khan is a 87 y.o. male with PMH as noted below who presents with right hip and elbow pain after a mechanical fall, acute onset this afternoon.  The patient states he was in a hardware store and was pushing a cart when he tripped over the wheel.  He fell to the ground but did not hit his head.  He denies any back pain or any other injuries besides the right elbow and hip.  He has had some difficulty putting weight on the hip since it happened.  Past Medical History:  Diagnosis Date   Anemia    Anginal pain (HCC)    Arthritis    osteoarthritis   Chronic kidney disease    stage 3   COPD (chronic obstructive pulmonary disease) (HCC)    NO INHALERS   Coronary artery disease    Dyspnea    with exertion   Hypertension    Polymyalgia rheumatica ([place])    TAKES PREDNISONE   Spinal stenosis     Patient Active Problem List   Diagnosis Date Noted   Femoral neck fracture ([place]) 07/07/2021   Spinal stenosis    Fall    Chest pain 01/22/2021   CKD (chronic kidney disease), stage IIIb 01/22/2021   COPD (chronic obstructive pulmonary disease) (HCC)    Coronary artery disease    Hypertension    Atrial fibrillation with RVR ([place])    Polymyalgia rheumatica ([place])    Sepsis ([place]) 03/17/2018    Past Surgical History:  Procedure Laterality Date   CARDIAC CATHETERIZATION Left 05/22/2016   Procedure: Left Heart Cath and Coronary Angiography;  Surgeon: Yolonda Kida, MD;  Location: [place] [facility];  Service: Cardiovascular;  Laterality: Left;   EYE SURGERY Bilateral    Cataract Extraction with IOL   GREEN LIGHT LASER TURP (TRANSURETHRAL RESECTION OF  PROSTATE N/A 04/26/2018   Procedure: GREEN LIGHT LASER TURP (TRANSURETHRAL RESECTION OF PROSTATE;  Surgeon: Royston Cowper, MD;  Location: [facility] ORS;  Service: Urology;  Laterality: N/A;   INTRAVASCULAR PRESSURE WIRE/FFR STUDY N/A 09/15/2018   Procedure: INTRAVASCULAR PRESSURE WIRE/FFR STUDY;  Surgeon: Yolonda Kida, MD;  Location: [place] [facility];  Service: Cardiovascular;  Laterality: N/A;   JOINT REPLACEMENT Right 2004   Shoulder Replacement   JOINT REPLACEMENT Left 2007   Ankle Replacement   LEFT HEART CATH AND CORONARY ANGIOGRAPHY Left 09/15/2018   Procedure: LEFT HEART CATH AND CORONARY ANGIOGRAPHY;  Surgeon: Yolonda Kida, MD;  Location: [place] CV LAB;  Service: Cardiovascular;  Laterality: Left;   PATELLAR TENDON REPAIR Left 07/15/2017   Procedure: PATELLA TENDON REPAIR;  Surgeon: Hessie Knows, MD;  Location: [facility] ORS;  Service: Orthopedics;  Laterality: Left;   QUADRICEPS TENDON REPAIR Left 07/15/2017   Procedure: REPAIR QUADRICEP TENDON;  Surgeon: Hessie Knows, MD;  Location: [facility] ORS;  Service: Orthopedics;  Laterality: Left;   TONSILLECTOMY     VASECTOMY      Prior to Admission medications   Medication Sig Start Date End Date Taking? Authorizing Provider  acetaminophen (TYLENOL) 500 MG tablet Take 1,000 mg by mouth every 8 (eight) hours as needed (pain).    [provider]  amiodarone (PACERONE)  200 MG tablet Take 1 tablet (200 mg total) by mouth 2 (two) times daily. 01/23/21   Sharen Hones, MD  amLODipine (NORVASC) 10 MG tablet Take 10 mg by mouth daily. 01/01/21   [provider]  apixaban (ELIQUIS) 5 MG TABS tablet Take 1 tablet (5 mg total) by mouth 2 (two) times daily. 01/23/21   Sharen Hones, MD  aspirin EC 81 MG tablet Take 81 mg by mouth daily.    [provider]  Calcium Carb-Cholecalciferol 500-400 MG-UNIT TABS Take 1 tablet by mouth daily.    [provider]  docusate sodium (COLACE) 100 MG capsule Take 100 mg by  mouth every evening.    [provider]  gabapentin (NEURONTIN) 100 MG capsule Take 100 mg by mouth daily. 11/27/20   [provider]  isosorbide mononitrate (IMDUR) 30 MG 24 hr tablet Take 30 mg by mouth daily. 12/11/20   [provider]  losartan (COZAAR) 50 MG tablet Take 50 mg by mouth daily. 01/01/21   [provider]  METAMUCIL FIBER PO Take by mouth.    [provider]  Multiple Vitamins-Minerals (MULTIVITAMIN PO) Take 1 tablet by mouth daily.    [provider]  Multiple Vitamins-Minerals (SYSTANE ICAPS AREDS2 PO) Take 1 capsule by mouth in the morning and at bedtime. 11/25/19   [provider]  nitroGLYCERIN (NITROSTAT) 0.4 MG SL tablet Place 0.4 mg under the tongue every 5 (five) minutes as needed for chest pain.    [provider]  predniSONE (DELTASONE) 10 MG tablet Take 10 mg by mouth daily with breakfast.    [provider]  zolpidem (AMBIEN) 5 MG tablet Take 5 mg by mouth at bedtime as needed for sleep.    [provider]    Allergies Benazepril  Family History  Problem Relation Age of Onset   Hypertension Mother     Social History Social History   Tobacco Use   Smoking status: Former    Packs/day: 2.00    Years: 22.00    Pack years: 44.00    Types: Cigarettes    Quit date: 05/22/1972    Years since quitting: 49.1   Smokeless tobacco: Never  Vaping Use   Vaping Use: Never used  Substance Use Topics   Alcohol use: Yes    Alcohol/week: 7.0 standard drinks    Types: 7 Glasses of wine per week    Comment: daily   Drug use: No    Review of Systems  Constitutional: No fever/chills Eyes: No visual changes. ENT: No neck pain. Cardiovascular: Denies chest pain. Respiratory: Denies shortness of breath. Gastrointestinal: No vomiting or diarrhea.  Genitourinary: Negative for dysuria.  Musculoskeletal: Negative for back pain.  Positive for right knee and elbow pain. Skin: Negative  for rash. Neurological: Negative for headaches, focal weakness or numbness.   ____________________________________________   PHYSICAL EXAM:  VITAL SIGNS: ED Triage Vitals  Enc Vitals Group     BP 07/07/21 1131 128/65     Pulse Rate 07/07/21 1131 65     Resp 07/07/21 1131 17     Temp 07/07/21 1131 (!) 97.5 F (36.4 C)     Temp Source 07/07/21 1131 Oral     SpO2 07/07/21 1131 90 %     Weight 07/07/21 1120 139 lb 8.8 oz (63.3 kg)     Height 07/07/21 1120 5\' 3"  (1.6 m)     Head Circumference --      Peak Flow --  Pain Score 07/07/21 1120 8     Pain Loc --      Pain Edu? --      Excl. in GC? --     Constitutional: Alert and oriented. Well appearing and in no acute distress. Eyes: Conjunctivae are normal.  Head: Atraumatic. Nose: No congestion/rhinnorhea. Mouth/Throat: Mucous membranes are moist.   Neck: Normal range of motion.  Cardiovascular: Normal rate, regular rhythm. Good peripheral circulation. Respiratory: Normal respiratory effort.  No retractions.  Gastrointestinal: No distention.  Musculoskeletal: No lower extremity edema.  Extremities warm and well perfused.  Pain on range of motion of right hip.  No deformity or shortening.  Full range of motion to the right elbow with minimal tenderness. Neurologic:  Normal speech and language. No gross focal neurologic deficits are appreciated.  Skin:  Skin is warm and dry. No rash noted. Psychiatric: Mood and affect are normal. Speech and behavior are normal.  ____________________________________________   LABS (all labs ordered are listed, but only abnormal results are displayed)  Labs Reviewed  BASIC METABOLIC PANEL - Abnormal; Notable for the following components:      Result Value   BUN 34 (*)    Creatinine, Ser 1.38 (*)    GFR, Estimated 48 (*)    All other components within normal limits  CBC WITH DIFFERENTIAL/PLATELET - Abnormal; Notable for the following components:   WBC 15.1 (*)    Neutro Abs 13.2 (*)     All other components within normal limits  RESP PANEL BY RT-PCR (FLU A&B, COVID) ARPGX2  CBC  BASIC METABOLIC PANEL   ____________________________________________  EKG   ____________________________________________  RADIOLOGY  XR R hip: IMPRESSION:  1. Acute mildly impacted right femoral neck fracture.   XR R elbow:  IMPRESSION:  1. No acute osseous abnormality.  2. Mild osteoarthritis.    ____________________________________________   PROCEDURES  Procedure(s) performed: No  Procedures  Critical Care performed: No ____________________________________________   INITIAL IMPRESSION / ASSESSMENT AND PLAN / ED COURSE  Pertinent labs & imaging results that were available during my care of the patient were reviewed by me and considered in my medical decision making (see chart for details).   87 year old male presents with right hip and elbow pain after a mechanical fall from standing height.  He denies other injuries.  He did not hit his head.  On exam his vital signs are normal and the patient is well-appearing.  He has good range of motion and minimal tenderness to the right elbow, but has decreased range of motion and more significant tenderness to the right hip.  Both extremities are neuro/vascular intact.  We will obtain x-rays and reassess.  ----------------------------------------- 3:00 PM on 07/07/2021 -----------------------------------------  X-ray revealed a femoral neck fracture.  I consulted Dr. Odis Luster from orthopedics, and then consulted Dr. Joylene Igo from the hospitalist service for admission. ____________________________________________   FINAL CLINICAL IMPRESSION(S) / ED DIAGNOSES  Final diagnoses:  Closed fracture of neck of right femur, initial encounter (HCC)  Contusion of right elbow, initial encounter      NEW MEDICATIONS STARTED DURING THIS VISIT:  Current Discharge Medication List       Note:  This document was prepared using  Dragon voice recognition software and may include unintentional dictation errors.    Dionne Bucy, MD 07/07/21 1924

## 2021-07-07 NOTE — ED Notes (Addendum)
See triage note. Pt alert and oriented, had mechanical fall to R hip this morning. Complains of pain to R elbow and R hip, worse with weight bearing and movement. Able to move toes R side. Denies head injury.

## 2021-07-07 NOTE — ED Notes (Signed)
Pt presents to the ED from home. Pt had a mechanical fall and landed on his R hip and R elbow. Pt states he has pain to the R hip. Pt is A&OX4 and NAD. Pt is able stand and pivot. Denies head injury but is on Eliquis.

## 2021-07-08 DIAGNOSIS — W19XXXA Unspecified fall, initial encounter: Secondary | ICD-10-CM

## 2021-07-08 DIAGNOSIS — J449 Chronic obstructive pulmonary disease, unspecified: Secondary | ICD-10-CM

## 2021-07-08 DIAGNOSIS — I2510 Atherosclerotic heart disease of native coronary artery without angina pectoris: Secondary | ICD-10-CM

## 2021-07-08 DIAGNOSIS — M4800 Spinal stenosis, site unspecified: Secondary | ICD-10-CM

## 2021-07-08 DIAGNOSIS — M353 Polymyalgia rheumatica: Secondary | ICD-10-CM

## 2021-07-08 LAB — BASIC METABOLIC PANEL
Anion gap: 7 (ref 5–15)
BUN: 37 mg/dL — ABNORMAL HIGH (ref 8–23)
CO2: 24 mmol/L (ref 22–32)
Calcium: 8.4 mg/dL — ABNORMAL LOW (ref 8.9–10.3)
Chloride: 102 mmol/L (ref 98–111)
Creatinine, Ser: 1.38 mg/dL — ABNORMAL HIGH (ref 0.61–1.24)
GFR, Estimated: 48 mL/min — ABNORMAL LOW (ref >60)
Glucose, Bld: 105 mg/dL — ABNORMAL HIGH (ref 70–99)
Potassium: 4.6 mmol/L (ref 3.5–5.1)
Sodium: 133 mmol/L — ABNORMAL LOW (ref 135–145)

## 2021-07-08 LAB — CBC
HCT: 34 % — ABNORMAL LOW (ref 39.0–52.0)
Hemoglobin: 11.5 g/dL — ABNORMAL LOW (ref 13.0–17.0)
MCH: 31.4 pg (ref 26.0–34.0)
MCHC: 33.8 g/dL (ref 30.0–36.0)
MCV: 92.9 fL (ref 80.0–100.0)
Platelets: 227 10*3/uL (ref 150–400)
RBC: 3.66 MIL/uL — ABNORMAL LOW (ref 4.22–5.81)
RDW: 15 % (ref 11.5–15.5)
WBC: 10.9 10*3/uL — ABNORMAL HIGH (ref 4.0–10.5)
nRBC: 0 % (ref 0.0–0.2)

## 2021-07-08 MED ORDER — CEFAZOLIN SODIUM-DEXTROSE 2-4 GM/100ML-% IV SOLN
2.0000 g | INTRAVENOUS | Status: AC
  Administered 2021-07-09: 11:00:00 2 g via INTRAVENOUS

## 2021-07-08 MED ORDER — PANTOPRAZOLE SODIUM 40 MG PO TBEC
40.0000 mg | DELAYED_RELEASE_TABLET | Freq: Every day | ORAL | Status: DC
  Administered 2021-07-08 – 2021-07-12 (×5): 40 mg via ORAL
  Filled 2021-07-08 (×5): qty 1

## 2021-07-08 NOTE — TOC Initial Note (Signed)
Transition of Care [facility]) - Initial/Assessment Note    Patient Details  Name: Caleb Khan MRN: [id number] Date of Birth: 21-Feb-1928  Transition of [facility]) CM/SW Contact:    Marlowe Sax, RN Phone Number: 07/08/2021, 9:50 AM  Clinical Narrative:               Patient comes from home with his wife, he fell at a hardware store, Surgery Planned for Wed, PT to eval after, TOC to monitor for needs and assist with DC planning         Patient Goals and CMS Choice        Expected Discharge Plan and Services                                                Prior Living Arrangements/Services                       Activities of Daily Living Home Assistive Devices/Equipment: Environmental consultant (specify type), Eyeglasses, Hearing aid ADL Screening (condition at time of admission) Patient's cognitive ability adequate to safely complete daily activities?: Yes Is the patient deaf or have difficulty hearing?: Yes Does the patient have difficulty seeing, even when wearing glasses/contacts?: Yes Does the patient have difficulty concentrating, remembering, or making decisions?: Yes Patient able to express need for assistance with ADLs?: Yes Does the patient have difficulty dressing or bathing?: No Independently performs ADLs?: Yes (appropriate for developmental age) Does the patient have difficulty walking or climbing stairs?: Yes Weakness of Legs: Both Weakness of Arms/Hands: Both  Permission Sought/Granted                  Emotional Assessment              Admission diagnosis:  Femoral neck fracture (HCC) [S72.009A] Contusion of right elbow, initial encounter [S50.01XA] Closed fracture of neck of right femur, initial encounter (HCC) [S72.001A] Patient Active Problem List   Diagnosis Date Noted   Femoral neck fracture (HCC) 07/07/2021   Spinal stenosis    Fall    Chest pain 01/22/2021   CKD (chronic kidney disease), stage IIIb 01/22/2021   COPD  (chronic obstructive pulmonary disease) (HCC)    Coronary artery disease    Hypertension    Atrial fibrillation with RVR (HCC)    Polymyalgia rheumatica (HCC)    Sepsis (HCC) 03/17/2018   PCP:  Marisue Ivan, MD Pharmacy:   [facility] [phone] [facility] - [address] [place] [address] Phone: [phone] Fax: [phone]     Social Determinants of Health (SDOH) Interventions    Readmission Risk Interventions No flowsheet data found.

## 2021-07-08 NOTE — Progress Notes (Signed)
Initial Nutrition Assessment  DOCUMENTATION CODES:   Not applicable  INTERVENTION:   -Magic cup BID with meals, each supplement provides 290 kcal and 9 grams of protein  -MVI with minerals daily  NUTRITION DIAGNOSIS:   Increased nutrient needs related to post-op healing as evidenced by estimated needs.  GOAL:   Patient will meet greater than or equal to 90% of their needs  MONITOR:   PO intake, Supplement acceptance, Labs, Weight trends, Skin, I & O's  REASON FOR ASSESSMENT:   Malnutrition Screening Tool    ASSESSMENT:   Caleb Khan is a 87 y.o. male with medical history significant for spinal stenosis, stage III chronic kidney disease, coronary artery disease, A. fib on anticoagulation who presents to the ER for evaluation after he fell at the hardware store.  He said he tripped on his cart and fell landing on his right hip.  He was unable to bear weight on his right lower extremity and so EMS was called.  He also complains of pain to his right elbow.  Pt admitted with rt femoral neck fracture.   Reviewed I/O's: -650 ml x 24 hours  UOP: 650 ml x 24 hours  Per orthopedics notes, plan for surgery tomorrow (07/09/21).   Pt sleeping soundly at time of visit. He did not arouse to voice or touch.   Pt currently on a 2 gram sodium diet. Pt with good appetite. Noted meal completions 100%.  Reviewed wt hx; wt has been stable over the past year.   Medications reviewed and include colace, metamucil, and prednisone.   Labs reviewed: Na: 133, CBGS: 91 (inpatient orders for glycemic control are none).    NUTRITION - FOCUSED PHYSICAL EXAM:  Flowsheet Row Most Recent Value  Orbital Region No depletion  Upper Arm Region No depletion  Thoracic and Lumbar Region No depletion  Buccal Region No depletion  Temple Region No depletion  Clavicle Bone Region No depletion  Clavicle and Acromion Bone Region No depletion  Scapular Bone Region No depletion  Dorsal Hand No depletion   Patellar Region No depletion  Anterior Thigh Region No depletion  Posterior Calf Region No depletion  Edema (RD Assessment) None  Hair Reviewed  Eyes Reviewed  Mouth Reviewed  Skin Reviewed  Nails Reviewed       Diet Order:   Diet Order             Diet NPO time specified  Diet effective midnight           Diet 2 gram sodium Room service appropriate? Yes; Fluid consistency: Thin  Diet effective now                   EDUCATION NEEDS:   No education needs have been identified at this time  Skin:  Skin Assessment: Reviewed RN Assessment  Last BM:  Unknown  Height:   Ht Readings from Last 1 Encounters:  07/07/21 5\' 3"  (1.6 m)    Weight:   Wt Readings from Last 1 Encounters:  07/07/21 63.3 kg    Ideal Body Weight:  56.4 kg  BMI:  Body mass index is 24.72 kg/m.  Estimated Nutritional Needs:   Kcal:  1600-1800  Protein:  80-95 grams  Fluid:  > 1.6 L    14/12/22, RD, LDN, CDCES Registered Dietitian II Certified Diabetes Care and Education Specialist Please refer to [facility] for RD and/or RD on-call/weekend/after hours pager

## 2021-07-08 NOTE — Progress Notes (Addendum)
PROGRESS NOTE  Caleb Khan XEN:[id number] DOB: 06-06-1928 DOA: 07/07/2021 PCP: Marisue Ivan, MD   LOS: 1 day   Brief narrative:   Caleb Khan is a 87 y.o. male with past medical history of spinal stenosis, stage III CKD, coronary artery disease, atrial fibrillation on anticoagulation presented hospital after sustaining a fall at a store with difficulty bearing weight on the right lower extremity.  EMS was called in and patient was brought into the hospital.  Initial white count was elevated at 15,000.  Right hip showed acute mildly impacted right femoral neck fracture.  Right elbow x-ray showed no acute osseous abnormality.  Patient was then admitted hospital for further evaluation and treatment   Assessment/Plan:  Principal Problem:   Femoral neck fracture (HCC) Active Problems:   CKD (chronic kidney disease), stage IIIb   COPD (chronic obstructive pulmonary disease) (HCC)   Coronary artery disease   Hypertension   Polymyalgia rheumatica (HCC)   Spinal stenosis   Fall   Right femoral neck fracture status post fall  Patient has been seen by orthopedics who recommended surgical intervention 07/09/2021.  Continue supportive care including pain management and immobilization.  Eliquis on hold at this time.  Patient is medically optimized for surgical intervention.    Chronic atrial fibrillation Eliquis on hold for surgery on 07/09/2021.  Continue amiodarone.  Rate controlled.  Leukocytosis.  Likely reactive.  Monitor closely.  Has trended down today.  Patient is on chronic steroids as well.  Mild hyponatremia.  Sodium of 133.  We will continue to monitor closely.  CKD stage IIIb.  Latest creatinine of 1.3.  Has remained stable.    Coronary artery disease Stable.  Aspirin on hold.  Continue nitrates.  No acute issues.    Essential hypertension Continue amlodipine and losartan.    History of polymyalgia rheumatica, spinal stenosis Continue daily prednisone.   DVT  prophylaxis: SCDs Start: 07/07/21 1422   Code Status: DNR  Family Communication:  Spoke with the patient's wife at bedside  Status is: Inpatient  Remains inpatient appropriate because: Hip fracture needing operative intervention  Consultants: Orthopedics  Procedures: None  Anti-infectives:  None  Anti-infectives (From admission, onward)    None       Subjective: Today, patient was seen and examined at bedside.  Patient states that he has pain on moving the right hip.  Denies any shortness of breath, dizziness lightheadedness or chest pain  Objective: Vitals:   07/07/21 2001 07/08/21 0451  BP: (!) 145/74 (!) 145/65  Pulse: 76 61  Resp: 20 18  Temp: 98.3 F (36.8 C) 98 F (36.7 C)  SpO2: 92% 93%    Intake/Output Summary (Last 24 hours) at 07/08/2021 0716 Last data filed at 07/08/2021 0458 Gross per 24 hour  Intake --  Output 650 ml  Net -650 ml   Filed Weights   07/07/21 1120  Weight: 63.3 kg   Body mass index is 24.72 kg/m.   Physical Exam:  GENERAL: Patient is alert awake and oriented. Not in obvious distress. HENT: No scleral pallor or icterus. Pupils equally reactive to light. Oral mucosa is moist NECK: is supple, no gross swelling noted. CHEST: Clear to auscultation. No crackles or wheezes.  Diminished breath sounds bilaterally. CVS: S1 and S2 heard, no murmur.  Irregular rhythm ABDOMEN: Soft, non-tender, bowel sounds are present. EXTREMITIES: Tenderness over the hip. CNS: Cranial nerves are intact. No focal motor deficits. SKIN: warm and dry without rashes.  Data Review: I have personally reviewed the  following laboratory data and studies,  CBC: Recent Labs  Lab 07/07/21 1417 07/08/21 0257  WBC 15.1* 10.9*  NEUTROABS 13.2*  --   HGB 13.6 11.5*  HCT 40.3 34.0*  MCV 94.8 92.9  PLT 260 227   Basic Metabolic Panel: Recent Labs  Lab 07/07/21 1417 07/08/21 0257  NA 135 133*  K 4.5 4.6  CL 103 102  CO2 23 24  GLUCOSE 95 105*   BUN 34* 37*  CREATININE 1.38* 1.38*  CALCIUM 9.1 8.4*   Liver Function Tests: No results for input(s): AST, ALT, ALKPHOS, BILITOT, PROT, ALBUMIN in the last 168 hours. No results for input(s): LIPASE, AMYLASE in the last 168 hours. No results for input(s): AMMONIA in the last 168 hours. Cardiac Enzymes: No results for input(s): CKTOTAL, CKMB, CKMBINDEX, TROPONINI in the last 168 hours. BNP (last 3 results) Recent Labs    01/22/21 0632  BNP 423.2*    ProBNP (last 3 results) No results for input(s): PROBNP in the last 8760 hours.  CBG: No results for input(s): GLUCAP in the last 168 hours. Recent Results (from the past 240 hour(s))  Resp Panel by RT-PCR (Flu A&B, Covid) Nasopharyngeal Swab     Status: None   Collection Time: 07/07/21  2:25 PM   Specimen: Nasopharyngeal Swab; Nasopharyngeal(NP) swabs in vial transport medium  Result Value Ref Range Status   SARS Coronavirus 2 by RT PCR NEGATIVE NEGATIVE Final    Comment: (NOTE) SARS-CoV-2 target nucleic acids are NOT DETECTED.  The SARS-CoV-2 RNA is generally detectable in upper respiratory specimens during the acute phase of infection. The lowest concentration of SARS-CoV-2 viral copies this assay can detect is 138 copies/mL. A negative result does not preclude SARS-Cov-2 infection and should not be used as the sole basis for treatment or other patient management decisions. A negative result may occur with  improper specimen collection/handling, submission of specimen other than nasopharyngeal swab, presence of viral mutation(s) within the areas targeted by this assay, and inadequate number of viral copies(<138 copies/mL). A negative result must be combined with clinical observations, patient history, and epidemiological information. The expected result is Negative.  Fact Sheet for Patients:  BloggerCourse.com  Fact Sheet for Healthcare Providers:   SeriousBroker.it  This test is no t yet approved or cleared by the [place] FDA and  has been authorized for detection and/or diagnosis of SARS-CoV-2 by FDA under an Emergency Use Authorization (EUA). This EUA will remain  in effect (meaning this test can be used) for the duration of the COVID-19 declaration under Section 564(b)(1) of the Act, 21 U.S.C.section 360bbb-3(b)(1), unless the authorization is terminated  or revoked sooner.       Influenza A by PCR NEGATIVE NEGATIVE Final   Influenza B by PCR NEGATIVE NEGATIVE Final    Comment: (NOTE) The Xpert Xpress SARS-CoV-2/FLU/RSV plus assay is intended as an aid in the diagnosis of influenza from Nasopharyngeal swab specimens and should not be used as a sole basis for treatment. Nasal washings and aspirates are unacceptable for Xpert Xpress SARS-CoV-2/FLU/RSV testing.  Fact Sheet for Patients: BloggerCourse.com  Fact Sheet for Healthcare Providers: SeriousBroker.it  This test is not yet approved or cleared by the [place] FDA and has been authorized for detection and/or diagnosis of SARS-CoV-2 by FDA under an Emergency Use Authorization (EUA). This EUA will remain in effect (meaning this test can be used) for the duration of the COVID-19 declaration under Section 564(b)(1) of the Act, 21 U.S.C. section 360bbb-3(b)(1), unless the authorization  is terminated or revoked.  Performed at [facility], [address] [place], [place] [address]      Studies: DG Elbow Complete Right  Result Date: 07/07/2021 CLINICAL DATA:  Right elbow pain after a fall. EXAM: RIGHT ELBOW - COMPLETE 3+ VIEW COMPARISON:  None. FINDINGS: No acute fracture or dislocation. No joint effusion. Mild degenerative changes with joint space narrowing and marginal osteophytes. Soft tissues are unremarkable. IMPRESSION: 1. No acute osseous abnormality. 2. Mild  osteoarthritis. Electronically Signed   By: Titus Dubin M.D.   On: 07/07/2021 13:53   DG Hip Unilat W or Wo Pelvis 2-3 Views Right  Result Date: 07/07/2021 CLINICAL DATA:  Right hip pain after fall. EXAM: DG HIP (WITH OR WITHOUT PELVIS) 2-3V RIGHT COMPARISON:  CT abdomen pelvis dated April 03, 2020. FINDINGS: Acute mildly impacted right femoral neck fracture. No dislocation. The pubic symphysis and sacroiliac joints are intact. IMPRESSION: 1. Acute mildly impacted right femoral neck fracture. Electronically Signed   By: Titus Dubin M.D.   On: 07/07/2021 13:55      Flora Lipps, MD  [facility] 07/08/2021  If 7PM-7AM, please contact night-coverage

## 2021-07-08 NOTE — Anesthesia Preprocedure Evaluation (Addendum)
Anesthesia Evaluation  Patient identified by MRN, date of birth, ID band Patient awake    Reviewed: Allergy & Precautions, H&P , NPO status , Patient's Chart, lab work & pertinent test results  Airway Mallampati: III  TM Distance: >3 FB Neck ROM: Limited    Dental no notable dental hx. (+) Implants   Pulmonary shortness of breath and with exertion, COPD, former smoker,    Pulmonary exam normal        Cardiovascular Exercise Tolerance: Poor hypertension, Pt. on medications + angina + CAD and + DOE  + dysrhythmias (takes apixaban and amiodarone) Atrial Fibrillation  Rhythm:Irregular Rate:Normal - Peripheral Edema LEFT HEART CATH AND CORONARY ANGIOGRAPHY  2020: -Ost LAD to Prox LAD lesion is 40% stenosed. -Mid LAD lesion is 25% stenosed. -Ost 1st Diag lesion is 75% stenosed. Conclusion Normal overall left ventricular function Moderate coronary disease in the proximal LAD of around 50% High-grade lesion in the medium-sized diagonal of about 75% bifurcation lesion Normal right coronary artery moderate in size Negative IFR of proximal LAD lesion 0.95 was measured   EKG 12/22: Afib  ECHO 6/22: 1. Left ventricular ejection fraction, by estimation, is 60 to 65%. The  left ventricle has normal function. The left ventricle has no regional  wall motion abnormalities. Left ventricular diastolic parameters were  normal.  2. Right ventricular systolic function is normal. The right ventricular  size is normal.  3. Left atrial size was mildly dilated.  4. Right atrial size was mildly dilated.  5. The mitral valve is abnormal. Mild mitral valve regurgitation. There  is mild prolapse of the medial segment of the anterior leaflet of the  mitral valve.  6. The aortic valve is tricuspid. Aortic valve regurgitation is mild.    Neuro/Psych Spinal stenosis negative psych ROS   GI/Hepatic negative GI ROS, Neg liver ROS,    Endo/Other  negative endocrine ROS  Renal/GU CRFRenal disease (stage 3)  negative genitourinary   Musculoskeletal  (+) Arthritis ,   Abdominal Normal abdominal exam  (+)   Peds negative pediatric ROS (+)  Hematology  (+) anemia ,   Anesthesia Other Findings Right hip fx Polymyalgia rheumatica- on chronic prednisone  2L [place] in place  Reproductive/Obstetrics negative OB ROS                           Anesthesia Physical Anesthesia Plan  ASA: 3  Anesthesia Plan: General   Post-op Pain Management:    Induction:   PONV Risk Score and Plan: 2 and Ondansetron and Dexamethasone  Airway Management Planned: Oral ETT  Additional Equipment:   Intra-op Plan:   Post-operative Plan: Extubation in OR  Informed Consent: I have reviewed the patients History and Physical, chart, labs and discussed the procedure including the risks, benefits and alternatives for the proposed anesthesia with the patient or authorized representative who has indicated his/her understanding and acceptance.    Discussed DNR with patient and Suspend DNR.   Dental advisory given  Plan Discussed with: CRNA  Anesthesia Plan Comments: (Last documented dose of Apixaban 07/07/21. Will confirm with patient pre-op. Plan for GA instead of spinal at this time as it has not been 72 hours since last dose of apixaban.)     Anesthesia Quick Evaluation

## 2021-07-09 ENCOUNTER — Inpatient Hospital Stay: Payer: Medicare PPO | Admitting: Anesthesiology

## 2021-07-09 ENCOUNTER — Inpatient Hospital Stay: Payer: Medicare PPO

## 2021-07-09 ENCOUNTER — Encounter: Payer: Self-pay | Admitting: Internal Medicine

## 2021-07-09 ENCOUNTER — Encounter
Admission: EM | Disposition: A | Discharge status: Home Health | Payer: Self-pay | Source: Home / Self Care | Attending: Internal Medicine

## 2021-07-09 ENCOUNTER — Other Ambulatory Visit: Payer: Self-pay

## 2021-07-09 HISTORY — PX: ANTERIOR APPROACH HEMI HIP ARTHROPLASTY: SHX6690

## 2021-07-09 LAB — CBC
HCT: 36.1 % — ABNORMAL LOW (ref 39.0–52.0)
Hemoglobin: 12.6 g/dL — ABNORMAL LOW (ref 13.0–17.0)
MCH: 32.2 pg (ref 26.0–34.0)
MCHC: 34.9 g/dL (ref 30.0–36.0)
MCV: 92.3 fL (ref 80.0–100.0)
Platelets: 205 10*3/uL (ref 150–400)
RBC: 3.91 MIL/uL — ABNORMAL LOW (ref 4.22–5.81)
RDW: 14.9 % (ref 11.5–15.5)
WBC: 10.3 10*3/uL (ref 4.0–10.5)
nRBC: 0 % (ref 0.0–0.2)

## 2021-07-09 LAB — BASIC METABOLIC PANEL
Anion gap: 7 (ref 5–15)
BUN: 33 mg/dL — ABNORMAL HIGH (ref 8–23)
CO2: 26 mmol/L (ref 22–32)
Calcium: 8.4 mg/dL — ABNORMAL LOW (ref 8.9–10.3)
Chloride: 99 mmol/L (ref 98–111)
Creatinine, Ser: 1.47 mg/dL — ABNORMAL HIGH (ref 0.61–1.24)
GFR, Estimated: 45 mL/min — ABNORMAL LOW (ref >60)
Glucose, Bld: 97 mg/dL (ref 70–99)
Potassium: 4.2 mmol/L (ref 3.5–5.1)
Sodium: 132 mmol/L — ABNORMAL LOW (ref 135–145)

## 2021-07-09 LAB — MAGNESIUM: Magnesium: 2.1 mg/dL (ref 1.7–2.4)

## 2021-07-09 SURGERY — HEMIARTHROPLASTY, HIP, DIRECT ANTERIOR APPROACH, FOR FRACTURE
Anesthesia: General | Site: Hip | Laterality: Right

## 2021-07-09 MED ORDER — APIXABAN 2.5 MG PO TABS
2.5000 mg | ORAL_TABLET | Freq: Two times a day (BID) | ORAL | Status: DC
  Administered 2021-07-10 – 2021-07-12 (×5): 2.5 mg via ORAL
  Filled 2021-07-09 (×5): qty 1

## 2021-07-09 MED ORDER — ACETAMINOPHEN 10 MG/ML IV SOLN: 1000.0000 mg | Freq: Once | INTRAVENOUS | Status: DC | PRN

## 2021-07-09 MED ORDER — OXYCODONE HCL 5 MG PO TABS
ORAL_TABLET | ORAL | Status: AC
  Filled 2021-07-09: qty 1

## 2021-07-09 MED ORDER — LIDOCAINE HCL (PF) 2 % IJ SOLN
INTRAMUSCULAR | Status: AC
  Filled 2021-07-09: qty 5

## 2021-07-09 MED ORDER — PHENOL 1.4 % MT LIQD
1.0000 | OROMUCOSAL | Status: DC | PRN
  Filled 2021-07-09: qty 177

## 2021-07-09 MED ORDER — BUPIVACAINE-EPINEPHRINE (PF) 0.25% -1:200000 IJ SOLN
INTRAMUSCULAR | Status: AC
  Filled 2021-07-09: qty 30

## 2021-07-09 MED ORDER — ROCURONIUM BROMIDE 10 MG/ML (PF) SYRINGE
PREFILLED_SYRINGE | INTRAVENOUS | Status: AC
  Filled 2021-07-09: qty 10

## 2021-07-09 MED ORDER — DOCUSATE SODIUM 100 MG PO CAPS
100.0000 mg | ORAL_CAPSULE | Freq: Two times a day (BID) | ORAL | Status: DC
  Administered 2021-07-09 – 2021-07-12 (×6): 100 mg via ORAL
  Filled 2021-07-09 (×6): qty 1

## 2021-07-09 MED ORDER — LIDOCAINE HCL (CARDIAC) PF 100 MG/5ML IV SOSY
PREFILLED_SYRINGE | INTRAVENOUS | Status: DC | PRN
  Administered 2021-07-09: 80 mg via INTRAVENOUS

## 2021-07-09 MED ORDER — OXYCODONE HCL 5 MG PO TABS
5.0000 mg | ORAL_TABLET | Freq: Once | ORAL | Status: AC | PRN
  Administered 2021-07-09: 13:00:00 5 mg via ORAL

## 2021-07-09 MED ORDER — DEXAMETHASONE SODIUM PHOSPHATE 10 MG/ML IJ SOLN
INTRAMUSCULAR | Status: AC
  Filled 2021-07-09: qty 1

## 2021-07-09 MED ORDER — EPHEDRINE 5 MG/ML INJ
INTRAVENOUS | Status: AC
  Filled 2021-07-09: qty 5

## 2021-07-09 MED ORDER — BUPIVACAINE-EPINEPHRINE (PF) 0.25% -1:200000 IJ SOLN
INTRAMUSCULAR | Status: DC | PRN
  Administered 2021-07-09: 20 mL

## 2021-07-09 MED ORDER — PRONTOSAN WOUND IRRIGATION OPTIME
TOPICAL | Status: DC | PRN
  Administered 2021-07-09: 1 via TOPICAL

## 2021-07-09 MED ORDER — METOCLOPRAMIDE HCL 5 MG/ML IJ SOLN
5.0000 mg | Freq: Three times a day (TID) | INTRAMUSCULAR | Status: DC | PRN
  Administered 2021-07-10: 10 mg via INTRAVENOUS
  Filled 2021-07-09: qty 2

## 2021-07-09 MED ORDER — IPRATROPIUM-ALBUTEROL 0.5-2.5 (3) MG/3ML IN SOLN
RESPIRATORY_TRACT | Status: AC
  Filled 2021-07-09: qty 3

## 2021-07-09 MED ORDER — ALUM & MAG HYDROXIDE-SIMETH 200-200-20 MG/5ML PO SUSP: 30.0000 mL | ORAL | Status: DC | PRN

## 2021-07-09 MED ORDER — MENTHOL 3 MG MT LOZG
1.0000 | LOZENGE | OROMUCOSAL | Status: DC | PRN
  Filled 2021-07-09: qty 9

## 2021-07-09 MED ORDER — LACTATED RINGERS IV SOLN: INTRAVENOUS | Status: DC | PRN

## 2021-07-09 MED ORDER — EPHEDRINE SULFATE 50 MG/ML IJ SOLN
INTRAMUSCULAR | Status: DC | PRN
  Administered 2021-07-09: 5 mg via INTRAVENOUS
  Administered 2021-07-09 (×2): 10 mg via INTRAVENOUS

## 2021-07-09 MED ORDER — TRANEXAMIC ACID 1000 MG/10ML IV SOLN
INTRAVENOUS | Status: DC | PRN
  Administered 2021-07-09: 12:00:00 1000 mg via TOPICAL

## 2021-07-09 MED ORDER — IPRATROPIUM-ALBUTEROL 0.5-2.5 (3) MG/3ML IN SOLN
3.0000 mL | RESPIRATORY_TRACT | Status: AC
  Administered 2021-07-09: 13:00:00 3 mL via RESPIRATORY_TRACT

## 2021-07-09 MED ORDER — PROPOFOL 1000 MG/100ML IV EMUL
INTRAVENOUS | Status: AC
  Filled 2021-07-09: qty 100

## 2021-07-09 MED ORDER — SODIUM CHLORIDE 0.9 % IV SOLN
INTRAVENOUS | Status: AC | PRN
  Administered 2021-07-09: 250 mL

## 2021-07-09 MED ORDER — 0.9 % SODIUM CHLORIDE (POUR BTL) OPTIME
TOPICAL | Status: DC | PRN
  Administered 2021-07-09: 11:00:00 500 mL

## 2021-07-09 MED ORDER — FENTANYL CITRATE (PF) 100 MCG/2ML IJ SOLN
25.0000 ug | INTRAMUSCULAR | Status: DC | PRN
  Administered 2021-07-09 (×2): 25 ug via INTRAVENOUS

## 2021-07-09 MED ORDER — MAGNESIUM HYDROXIDE 400 MG/5ML PO SUSP
30.0000 mL | Freq: Every day | ORAL | Status: DC | PRN
  Administered 2021-07-09 – 2021-07-10 (×2): 30 mL via ORAL
  Filled 2021-07-09 (×3): qty 30

## 2021-07-09 MED ORDER — ONDANSETRON HCL 4 MG PO TABS: 4.0000 mg | ORAL_TABLET | Freq: Four times a day (QID) | ORAL | Status: DC | PRN

## 2021-07-09 MED ORDER — FENTANYL CITRATE (PF) 100 MCG/2ML IJ SOLN
INTRAMUSCULAR | Status: AC
  Administered 2021-07-09: 12:00:00 25 ug via INTRAVENOUS
  Filled 2021-07-09: qty 2

## 2021-07-09 MED ORDER — CEFAZOLIN SODIUM-DEXTROSE 1-4 GM/50ML-% IV SOLN
1.0000 g | Freq: Four times a day (QID) | INTRAVENOUS | Status: AC
  Administered 2021-07-09 – 2021-07-10 (×2): 1 g via INTRAVENOUS
  Filled 2021-07-09 (×3): qty 50

## 2021-07-09 MED ORDER — BISACODYL 10 MG RE SUPP
10.0000 mg | Freq: Every day | RECTAL | Status: DC | PRN
  Administered 2021-07-10: 10 mg via RECTAL
  Filled 2021-07-09: qty 1

## 2021-07-09 MED ORDER — DEXAMETHASONE SODIUM PHOSPHATE 10 MG/ML IJ SOLN
INTRAMUSCULAR | Status: DC | PRN
Start: 2021-07-09 — End: 2021-07-09
  Administered 2021-07-09: 5 mg via INTRAVENOUS

## 2021-07-09 MED ORDER — ONDANSETRON HCL 4 MG/2ML IJ SOLN
INTRAMUSCULAR | Status: AC
  Filled 2021-07-09: qty 2

## 2021-07-09 MED ORDER — PROMETHAZINE HCL 25 MG/ML IJ SOLN: 6.2500 mg | INTRAMUSCULAR | Status: DC | PRN

## 2021-07-09 MED ORDER — OXYCODONE HCL 5 MG/5ML PO SOLN: 5.0000 mg | Freq: Once | ORAL | Status: AC | PRN

## 2021-07-09 MED ORDER — FENTANYL CITRATE (PF) 100 MCG/2ML IJ SOLN
INTRAMUSCULAR | Status: AC
  Filled 2021-07-09: qty 2

## 2021-07-09 MED ORDER — ACETAMINOPHEN 500 MG PO TABS: 1000.0000 mg | ORAL_TABLET | Freq: Once | ORAL | Status: AC

## 2021-07-09 MED ORDER — LACTATED RINGERS IV SOLN: INTRAVENOUS | Status: DC

## 2021-07-09 MED ORDER — ONDANSETRON HCL 4 MG/2ML IJ SOLN
4.0000 mg | Freq: Four times a day (QID) | INTRAMUSCULAR | Status: DC | PRN
  Administered 2021-07-10: 4 mg via INTRAVENOUS
  Filled 2021-07-09: qty 2

## 2021-07-09 MED ORDER — TRANEXAMIC ACID 1000 MG/10ML IV SOLN
INTRAVENOUS | Status: AC
  Filled 2021-07-09: qty 10

## 2021-07-09 MED ORDER — ONDANSETRON HCL 4 MG/2ML IJ SOLN
INTRAMUSCULAR | Status: DC | PRN
  Administered 2021-07-09: 4 mg via INTRAVENOUS

## 2021-07-09 MED ORDER — ACETAMINOPHEN 500 MG PO TABS
ORAL_TABLET | ORAL | Status: AC
  Administered 2021-07-09: 09:00:00 1000 mg via ORAL
  Filled 2021-07-09: qty 2

## 2021-07-09 MED ORDER — FENTANYL CITRATE (PF) 100 MCG/2ML IJ SOLN
INTRAMUSCULAR | Status: DC | PRN
  Administered 2021-07-09: 50 ug via INTRAVENOUS
  Administered 2021-07-09 (×2): 25 ug via INTRAVENOUS

## 2021-07-09 MED ORDER — PHENYLEPHRINE HCL-NACL 20-0.9 MG/250ML-% IV SOLN
INTRAVENOUS | Status: DC | PRN
  Administered 2021-07-09: 30 ug/min via INTRAVENOUS

## 2021-07-09 MED ORDER — SODIUM CHLORIDE 0.9 % IR SOLN
Status: DC | PRN
  Administered 2021-07-09: 3000 mL

## 2021-07-09 MED ORDER — ROCURONIUM BROMIDE 100 MG/10ML IV SOLN
INTRAVENOUS | Status: DC | PRN
  Administered 2021-07-09: 10 mg via INTRAVENOUS
  Administered 2021-07-09: 40 mg via INTRAVENOUS
  Administered 2021-07-09: 20 mg via INTRAVENOUS

## 2021-07-09 MED ORDER — STERILE WATER FOR IRRIGATION IR SOLN
Status: DC | PRN
  Administered 2021-07-09: 1000 mL

## 2021-07-09 MED ORDER — PROPOFOL 10 MG/ML IV BOLUS
INTRAVENOUS | Status: DC | PRN
  Administered 2021-07-09: 100 mg via INTRAVENOUS

## 2021-07-09 MED ORDER — SUGAMMADEX SODIUM 200 MG/2ML IV SOLN
INTRAVENOUS | Status: DC | PRN
  Administered 2021-07-09: 130 mg via INTRAVENOUS

## 2021-07-09 MED ORDER — KETOROLAC TROMETHAMINE 30 MG/ML IJ SOLN
INTRAMUSCULAR | Status: AC
  Filled 2021-07-09: qty 1

## 2021-07-09 MED ORDER — METOCLOPRAMIDE HCL 10 MG PO TABS: 5.0000 mg | ORAL_TABLET | Freq: Three times a day (TID) | ORAL | Status: DC | PRN

## 2021-07-09 SURGICAL SUPPLY — 57 items
BLADE SAGITTAL WIDE XTHICK NO (BLADE) ×2 IMPLANT
BNDG COHESIVE 4X5 TAN ST LF (GAUZE/BANDAGES/DRESSINGS) ×4 IMPLANT
BRUSH SCRUB EZ  4% CHG (MISCELLANEOUS) ×2
BRUSH SCRUB EZ 4% CHG (MISCELLANEOUS) ×2 IMPLANT
CHLORAPREP W/TINT 26 (MISCELLANEOUS) ×2 IMPLANT
DRAPE 3/4 80X56 (DRAPES) ×2 IMPLANT
DRAPE C-ARM 42X72 X-RAY (DRAPES) ×2 IMPLANT
DRAPE STERI IOBAN 125X83 (DRAPES) IMPLANT
DRAPE U-SHAPE 47X51 STRL (DRAPES) ×2 IMPLANT
DRSG AQUACEL AG ADV 3.5X10 (GAUZE/BANDAGES/DRESSINGS) ×1 IMPLANT
DRSG AQUACEL AG ADV 3.5X14 (GAUZE/BANDAGES/DRESSINGS) IMPLANT
ELECT REM PT RETURN 9FT ADLT (ELECTROSURGICAL) ×2
ELECTRODE REM PT RTRN 9FT ADLT (ELECTROSURGICAL) ×1 IMPLANT
GAUZE 4X4 16PLY ~~LOC~~+RFID DBL (SPONGE) ×2 IMPLANT
GAUZE XEROFORM 1X8 LF (GAUZE/BANDAGES/DRESSINGS) IMPLANT
GLOVE SURG ORTHO LTX SZ8 (GLOVE) ×4 IMPLANT
GLOVE SURG UNDER LTX SZ8 (GLOVE) ×2 IMPLANT
GOWN STRL REUS W/ TWL LRG LVL3 (GOWN DISPOSABLE) ×1 IMPLANT
GOWN STRL REUS W/ TWL XL LVL3 (GOWN DISPOSABLE) ×1 IMPLANT
GOWN STRL REUS W/TWL LRG LVL3 (GOWN DISPOSABLE) ×1
GOWN STRL REUS W/TWL XL LVL3 (GOWN DISPOSABLE) ×1
HEAD 28MM 0 (Hips) ×1 IMPLANT
HEAD BIPOLAR 50MM (Hips) ×1 IMPLANT
HOLSTER BOVIE DISP (MISCELLANEOUS) ×2 IMPLANT
IV NS 1000ML (IV SOLUTION)
IV NS 1000ML BAXH (IV SOLUTION) ×1 IMPLANT
IV NS 250ML (IV SOLUTION) ×1
IV NS 250ML BAXH (IV SOLUTION) IMPLANT
IV NS IRRIG 3000ML ARTHROMATIC (IV SOLUTION) ×1 IMPLANT
KIT PATIENT CARE HANA TABLE (KITS) ×2 IMPLANT
KIT TURNOVER CYSTO (KITS) ×2 IMPLANT
MANIFOLD NEPTUNE II (INSTRUMENTS) ×2 IMPLANT
MAT ABSORB  FLUID 56X50 GRAY (MISCELLANEOUS) ×1
MAT ABSORB FLUID 56X50 GRAY (MISCELLANEOUS) ×1 IMPLANT
NDL SAFETY ECLIPSE 18X1.5 (NEEDLE) IMPLANT
NDL SPNL 20GX3.5 QUINCKE YW (NEEDLE) ×1 IMPLANT
NEEDLE HYPO 18GX1.5 SHARP (NEEDLE)
NEEDLE HYPO 22GX1.5 SAFETY (NEEDLE) ×2 IMPLANT
NEEDLE SPNL 20GX3.5 QUINCKE YW (NEEDLE) ×2 IMPLANT
NS IRRIG 500ML POUR BTL (IV SOLUTION) ×1 IMPLANT
PACK HIP PROSTHESIS (MISCELLANEOUS) ×2 IMPLANT
PADDING CAST BLEND 4X4 NS (MISCELLANEOUS) ×4 IMPLANT
PILLOW ABDUCTION MEDIUM (MISCELLANEOUS) ×2 IMPLANT
PULSAVAC PLUS IRRIG FAN TIP (DISPOSABLE) ×2
SOLUTION PRONTOSAN WOUND 350ML (IRRIGATION / IRRIGATOR) IMPLANT
SPONGE T-LAP 18X18 ~~LOC~~+RFID (SPONGE) ×8 IMPLANT
STAPLER SKIN PROX 35W (STAPLE) ×2 IMPLANT
STEM POLAR STD SZ4 COLLAR (Stent) ×1 IMPLANT
SUT BONE WAX W31G (SUTURE) ×2 IMPLANT
SUT DVC 2 QUILL PDO  T11 36X36 (SUTURE) ×1
SUT DVC 2 QUILL PDO T11 36X36 (SUTURE) ×1 IMPLANT
SUT VIC AB 2-0 CT1 18 (SUTURE) ×2 IMPLANT
SYR 20ML LL LF (SYRINGE) ×2 IMPLANT
TIP FAN IRRIG PULSAVAC PLUS (DISPOSABLE) ×1 IMPLANT
WAND WEREWOLF FASTSEAL 6.0 (MISCELLANEOUS) ×1 IMPLANT
WATER STERILE IRR 1000ML POUR (IV SOLUTION) ×1 IMPLANT
WATER STERILE IRR 500ML POUR (IV SOLUTION) ×2 IMPLANT

## 2021-07-09 NOTE — Transfer of Care (Signed)
Immediate Anesthesia Transfer of Care Note  Patient: Caleb Khan  Procedure(s) Performed: ANTERIOR APPROACH HEMI HIP ARTHROPLASTY (Right: Hip)  Patient Location: PACU  Anesthesia Type:General  Level of Consciousness: drowsy and patient cooperative  Airway & Oxygen Therapy: Patient Spontanous Breathing and Patient connected to nasal cannula oxygen  Post-op Assessment: Report given to RN and Post -op Vital signs reviewed and stable  Post vital signs: Reviewed and stable  Last Vitals:  Vitals Value Taken Time  BP 108/67 07/09/21 1400  Temp 36.3 C 07/09/21 1300  Pulse 68 07/09/21 1358  Resp 14 07/09/21 1400  SpO2 92 % 07/09/21 1358  Vitals shown include unvalidated device data.  Last Pain:  Vitals:   07/09/21 1344  TempSrc:   PainSc: 2          Complications: No notable events documented.

## 2021-07-09 NOTE — Progress Notes (Signed)
Gave all meds except losartan per md with sips of water, chg bath done, pt denies pain.

## 2021-07-09 NOTE — Progress Notes (Signed)
PROGRESS NOTE  Caleb Khan QMV:[id number] DOB: 09-15-1927 DOA: 07/07/2021 PCP: Marisue IvanLinthavong, Kanhka, MD   LOS: 2 days   Brief narrative:  Caleb MoodLeon Tangeman is a 87 y.o. male with past medical history of spinal stenosis, stage III CKD, coronary artery disease, atrial fibrillation on anticoagulation presented hospital after sustaining a fall at a store with difficulty bearing weight on the right lower extremity.  EMS was called in and patient was brought into the hospital.  Initial white count was elevated at 15,000.  Right hip showed acute mildly impacted right femoral neck fracture.  Right elbow x-ray showed no acute osseous abnormality.  Patient was then admitted hospital for further evaluation and treatment   Assessment/Plan:  Principal Problem:   Femoral neck fracture (HCC) Active Problems:   CKD (chronic kidney disease), stage IIIb   COPD (chronic obstructive pulmonary disease) (HCC)   Coronary artery disease   Hypertension   Polymyalgia rheumatica (HCC)   Spinal stenosis   Fall   Close traumatic right femoral neck fracture:  N.p.o., IV fluids, adequate pain medications, for right hip arthroplasty today.  Eliquis on hold.    Chronic atrial fibrillation: Last dose of Eliquis 12/12.  Continue amiodarone.  Rate controlled in sinus rhythm today.    Leukocytosis: Mostly reactive.  On chronic steroids.    Coronary artery disease: Is stable.  Aspirin and Eliquis on hold.    Essential hypertension: Stable on amlodipine and losartan.  Continue.    Polymyalgia rheumatica and a spinal stenosis: On chronic prednisone therapy continued.     DVT prophylaxis: SCDs Start: 07/07/21 1422   Code Status: DNR  Family Communication: None at the bedside.  Status is: Inpatient  Remains inpatient appropriate because: Hip fracture needing operative intervention  Consultants: Orthopedics  Procedures: None  Anti-infectives:  None, preoperative antibiotics given.  Anti-infectives (From  admission, onward)    Start     Dose/Rate Route Frequency Ordered Stop   07/08/21 1316  [MAR Hold]  ceFAZolin (ANCEF) IVPB 2g/100 mL premix        (MAR Hold since Wed 07/09/2021 at 0905.Hold Reason: Transfer to a Procedural area)   2 g 200 mL/hr over 30 Minutes Intravenous 30 min pre-op 07/08/21 1316 07/09/21 1047       Subjective:  I have examined patient before he is going for surgery in the morning rounds.  He denies any complaints.  He was well informed and was waiting to go to operating room.  Objective: Vitals:   07/09/21 1300 07/09/21 1315  BP:  119/60  Pulse:  66  Resp:  13  Temp: (!) 97.4 F (36.3 C)   SpO2:  91%    Intake/Output Summary (Last 24 hours) at 07/09/2021 1333 Last data filed at 07/09/2021 1140 Gross per 24 hour  Intake 740 ml  Output 300 ml  Net 440 ml   Filed Weights   07/07/21 1120 07/09/21 0857  Weight: 63.3 kg 63.3 kg   Body mass index is 24.72 kg/m.   Physical Exam:  General: Looks comfortable. Cardiovascular: S1-S2 normal.  Irregularly irregular.  No murmurs. Respiratory: Bilateral clear. Gastrointestinal: Soft and nontender. Ext: Right hip fracture known, no range of motion elicited.  Distal neurovascular status intact.  Data Review: I have personally reviewed the following laboratory data and studies,  CBC: Recent Labs  Lab 07/07/21 1417 07/08/21 0257 07/09/21 0138  WBC 15.1* 10.9* 10.3  NEUTROABS 13.2*  --   --   HGB 13.6 11.5* 12.6*  HCT 40.3 34.0* 36.1*  MCV 94.8 92.9 92.3  PLT 260 227 205   Basic Metabolic Panel: Recent Labs  Lab 07/07/21 1417 07/08/21 0257 07/09/21 0138  NA 135 133* 132*  K 4.5 4.6 4.2  CL 103 102 99  CO2 23 24 26   GLUCOSE 95 105* 97  BUN 34* 37* 33*  CREATININE 1.38* 1.38* 1.47*  CALCIUM 9.1 8.4* 8.4*  MG  --   --  2.1   Liver Function Tests: No results for input(s): AST, ALT, ALKPHOS, BILITOT, PROT, ALBUMIN in the last 168 hours. No results for input(s): LIPASE, AMYLASE in the last  168 hours. No results for input(s): AMMONIA in the last 168 hours. Cardiac Enzymes: No results for input(s): CKTOTAL, CKMB, CKMBINDEX, TROPONINI in the last 168 hours. BNP (last 3 results) Recent Labs    01/22/21 0632  BNP 423.2*    ProBNP (last 3 results) No results for input(s): PROBNP in the last 8760 hours.  CBG: No results for input(s): GLUCAP in the last 168 hours. Recent Results (from the past 240 hour(s))  Resp Panel by RT-PCR (Flu A&B, Covid) Nasopharyngeal Swab     Status: None   Collection Time: 07/07/21  2:25 PM   Specimen: Nasopharyngeal Swab; Nasopharyngeal(NP) swabs in vial transport medium  Result Value Ref Range Status   SARS Coronavirus 2 by RT PCR NEGATIVE NEGATIVE Final    Comment: (NOTE) SARS-CoV-2 target nucleic acids are NOT DETECTED.  The SARS-CoV-2 RNA is generally detectable in upper respiratory specimens during the acute phase of infection. The lowest concentration of SARS-CoV-2 viral copies this assay can detect is 138 copies/mL. A negative result does not preclude SARS-Cov-2 infection and should not be used as the sole basis for treatment or other patient management decisions. A negative result may occur with  improper specimen collection/handling, submission of specimen other than nasopharyngeal swab, presence of viral mutation(s) within the areas targeted by this assay, and inadequate number of viral copies(<138 copies/mL). A negative result must be combined with clinical observations, patient history, and epidemiological information. The expected result is Negative.  Fact Sheet for Patients:  14/12/22  Fact Sheet for Healthcare Providers:  BloggerCourse.com  This test is no t yet approved or cleared by the [employer] FDA and  has been authorized for detection and/or diagnosis of SARS-CoV-2 by FDA under an Emergency Use Authorization (EUA). This EUA will remain  in effect (meaning  this test can be used) for the duration of the COVID-19 declaration under Section 564(b)(1) of the Act, 21 U.S.C.section 360bbb-3(b)(1), unless the authorization is terminated  or revoked sooner.       Influenza A by PCR NEGATIVE NEGATIVE Final   Influenza B by PCR NEGATIVE NEGATIVE Final    Comment: (NOTE) The Xpert Xpress SARS-CoV-2/FLU/RSV plus assay is intended as an aid in the diagnosis of influenza from Nasopharyngeal swab specimens and should not be used as a sole basis for treatment. Nasal washings and aspirates are unacceptable for Xpert Xpress SARS-CoV-2/FLU/RSV testing.  Fact Sheet for Patients: [place]  Fact Sheet for Healthcare Providers: BloggerCourse.com  This test is not yet approved or cleared by the [employer] FDA and has been authorized for detection and/or diagnosis of SARS-CoV-2 by FDA under an Emergency Use Authorization (EUA). This EUA will remain in effect (meaning this test can be used) for the duration of the COVID-19 declaration under Section 564(b)(1) of the Act, 21 U.S.C. section 360bbb-3(b)(1), unless the authorization is terminated or revoked.  Performed at [facility], [address] [place], [place] [address]      Studies: DG Elbow Complete Right  Result Date: 07/07/2021 CLINICAL DATA:  Right elbow pain after a fall. EXAM: RIGHT ELBOW - COMPLETE 3+ VIEW COMPARISON:  None. FINDINGS: No acute fracture or dislocation. No joint effusion. Mild degenerative changes with joint space narrowing and marginal osteophytes. Soft tissues are unremarkable. IMPRESSION: 1. No acute osseous abnormality. 2. Mild osteoarthritis. Electronically Signed   By: Titus Dubin M.D.   On: 07/07/2021 13:53   DG C-Arm 1-60 Min-No Report  Result Date: 07/09/2021 Fluoroscopy was utilized by the requesting physician.  No radiographic interpretation.   DG Hip Unilat W or Wo Pelvis 2-3 Views  Right  Result Date: 07/07/2021 CLINICAL DATA:  Right hip pain after fall. EXAM: DG HIP (WITH OR WITHOUT PELVIS) 2-3V RIGHT COMPARISON:  CT abdomen pelvis dated April 03, 2020. FINDINGS: Acute mildly impacted right femoral neck fracture. No dislocation. The pubic symphysis and sacroiliac joints are intact. IMPRESSION: 1. Acute mildly impacted right femoral neck fracture. Electronically Signed   By: Titus Dubin M.D.   On: 07/07/2021 13:55    Total time spent: 25 minutes  Barb Merino, MD  [facility] 07/09/2021  If 7PM-7AM, please contact night-coverage

## 2021-07-09 NOTE — Op Note (Signed)
07/09/2021  11:50 AM  PATIENT:  Caleb Khan   MRN: [id number]  PRE-OPERATIVE DIAGNOSIS:  Displaced Subcapital fracture right hip   POST-OPERATIVE DIAGNOSIS: Same  Procedure: Right Hip Anterior Hip Hemiarthroplasty   Surgeon: Dola Argyle. Odis Luster, MD   Assist: Altamese Cabal, PA-C  Anesthesia: Spinal   EBL: 100 mL   Specimens: None   Drains: None   Components used: A size 4 Polarstem [employer], a 50 mm by 28 mm +0 mm bipolar head    Description of the procedure in detail: After informed consent was obtained and the appropriate extremity marked in the pre-operative holding area, the patient was taken to the operating room and placed in the supine position on the fracture table. All pressure points were well padded and bilateral lower extremities were place in traction spars. The hip was prepped and draped in standard sterile fashion. A spinal anesthetic had been delivered by the anesthesia team. The skin and subcutaneous tissues were injected with a mixture of Marcaine with epinephrine for post-operative pain. A longitudinal incision approximately 10 cm in length was carried out from the anterior superior iliac spine to the greater trochanter. The tensor fascia was divided and blunt dissection was taken down to the level of the joint capsule. The lateral circumflex vessels were cauterized. Deep retractors were placed and a portion of the anterior capsule was excised. Using fluoroscopy the neck cut was planned and carried out with a sagittal saw. The head was passed from the field with use of a corkscrew and hip skid. Deep retractors were placed along the acetabulum and bony and soft tissue debris was removed.   Attention was then turned to the proximal femur. The leg was placed in extension and external rotation. The canal was opened and sequentially broached to a size 4. The trial components were placed and the hip relocated. The components were found to be in good position using  fluoroscopy. The hip was dislocated and the trial components removed. The final components were impacted in to position and the hip relocated. The final components were again check with fluoroscopy and found to be in good position. Hemostasis was achieved with electrocautery. The deep capsule was injected with Marcaine and epinephrine. The wound was irrigated with bacitracin laced normal saline and the tensor fascia closed with #2 Quill suture. The subcutaneous tissues were closed with 2-0 vicryl and staples for the skin. A sterile dressing was applied and an abduction pillow. Patient tolerated the procedure well and there were no apparent complication. Patient was taken to the recovery room in good condition.    Dola Argyle. Odis Luster, MD  07/09/2021 11:50 AM

## 2021-07-09 NOTE — Anesthesia Procedure Notes (Signed)
Procedure Name: Intubation Date/Time: 07/09/2021 10:24 AM Performed by: Jonna Clark, CRNA Pre-anesthesia Checklist: Patient identified, Patient being monitored, Timeout performed, Emergency Drugs available and Suction available Patient Re-evaluated:Patient Re-evaluated prior to induction Oxygen Delivery Method: Circle system utilized Preoxygenation: Pre-oxygenation with 100% oxygen Induction Type: IV induction Ventilation: Mask ventilation without difficulty Laryngoscope Size: Mac and 3 Grade View: Grade I Tube type: Oral Tube size: 7.0 mm Number of attempts: 2 Airway Equipment and Method: Stylet Placement Confirmation: ETT inserted through vocal cords under direct vision, positive ETCO2 and breath sounds checked- equal and bilateral Secured at: 21 cm Tube secured with: Tape Dental Injury: Teeth and Oropharynx as per pre-operative assessment  Comments: Floppy epiglottis. Would recommend longer blade

## 2021-07-09 NOTE — Anesthesia Postprocedure Evaluation (Signed)
Anesthesia Post Note  Patient: [name]  Procedure(s) Performed: ANTERIOR APPROACH HEMI HIP ARTHROPLASTY (Right: Hip)  Patient location during evaluation: PACU Anesthesia Type: General Level of consciousness: awake and alert Pain management: pain level controlled Vital Signs Assessment: post-procedure vital signs reviewed and stable Respiratory status: spontaneous breathing, nonlabored ventilation, respiratory function stable and patient connected to nasal cannula oxygen Cardiovascular status: blood pressure returned to baseline and stable Postop Assessment: no apparent nausea or vomiting Anesthetic complications: no Comments: Pulse ox with intermittent desaturation records. Pt was in no respiratory distress in pacu. He did obstruct when sleeping. The low saturations occurred during periods of irregular waveform from irregular heart rate. He has no complaints.    No notable events documented.   Last Vitals:  Vitals:   07/09/21 1345 07/09/21 1418  BP: (!) 95/58 101/81  Pulse: 69 69  Resp: 16 16  Temp:  36.7 C  SpO2: 91% 95%    Last Pain:  Vitals:   07/09/21 1418  TempSrc: Oral  PainSc:                  Caleb Khan

## 2021-07-09 NOTE — H&P (Signed)
The patient has been re-examined, and the chart reviewed, and there have been no interval changes to the documented history and physical.  Plan a right hip hemiarthroplasty today.  Anesthesia is not consulted regarding a peripheral nerve block for post-operative pain.  The risks, benefits, and alternatives have been discussed at length, and the patient is willing to proceed.     

## 2021-07-10 ENCOUNTER — Encounter: Payer: Self-pay | Admitting: Orthopedic Surgery

## 2021-07-10 LAB — CBC
HCT: 32.6 % — ABNORMAL LOW (ref 39.0–52.0)
Hemoglobin: 11.3 g/dL — ABNORMAL LOW (ref 13.0–17.0)
MCH: 32.2 pg (ref 26.0–34.0)
MCHC: 34.7 g/dL (ref 30.0–36.0)
MCV: 92.9 fL (ref 80.0–100.0)
Platelets: 197 10*3/uL (ref 150–400)
RBC: 3.51 MIL/uL — ABNORMAL LOW (ref 4.22–5.81)
RDW: 14.6 % (ref 11.5–15.5)
WBC: 13.5 10*3/uL — ABNORMAL HIGH (ref 4.0–10.5)
nRBC: 0 % (ref 0.0–0.2)

## 2021-07-10 LAB — BASIC METABOLIC PANEL
Anion gap: 11 (ref 5–15)
BUN: 45 mg/dL — ABNORMAL HIGH (ref 8–23)
CO2: 24 mmol/L (ref 22–32)
Calcium: 8 mg/dL — ABNORMAL LOW (ref 8.9–10.3)
Chloride: 95 mmol/L — ABNORMAL LOW (ref 98–111)
Creatinine, Ser: 1.99 mg/dL — ABNORMAL HIGH (ref 0.61–1.24)
GFR, Estimated: 31 mL/min — ABNORMAL LOW (ref >60)
Glucose, Bld: 131 mg/dL — ABNORMAL HIGH (ref 70–99)
Potassium: 4.6 mmol/L (ref 3.5–5.1)
Sodium: 130 mmol/L — ABNORMAL LOW (ref 135–145)

## 2021-07-10 MED ORDER — HYDROCODONE-ACETAMINOPHEN 5-325 MG PO TABS: 1.0000 | ORAL_TABLET | Freq: Four times a day (QID) | ORAL | 0 refills | Status: DC | PRN

## 2021-07-10 MED ORDER — LACTATED RINGERS IV SOLN: INTRAVENOUS | Status: AC

## 2021-07-10 NOTE — Care Management Important Message (Signed)
Important Message  Patient Details  Name: Caleb Khan MRN: [id number] Date of Birth: 03/30/1928   Medicare Important Message Given:  Yes     Olegario Messier A Hellen Shanley 07/10/2021, 3:24 PM

## 2021-07-10 NOTE — Plan of Care (Signed)
°  Problem: Education: Goal: Knowledge of General Education information will improve Description: Including pain rating scale, medication(s)/side effects and non-pharmacologic comfort measures Outcome: Progressing   Problem: Health Behavior/Discharge Planning: Goal: Ability to manage health-related needs will improve Outcome: Progressing   Problem: Clinical Measurements: Goal: Ability to maintain clinical measurements within normal limits will improve Outcome: Progressing Goal: Will remain free from infection Outcome: Progressing Goal: Diagnostic test results will improve Outcome: Progressing   Problem: Clinical Measurements: Goal: Will remain free from infection Outcome: Progressing   Problem: Clinical Measurements: Goal: Diagnostic test results will improve Outcome: Progressing

## 2021-07-10 NOTE — Progress Notes (Signed)
Physical Therapy Treatment Patient Details Name: Caleb Khan MRN: [id number] DOB: [birth year] Today's Date: 07/10/2021   History of Present Illness Pt is a 87 y.o. male with past medical history of spinal stenosis, stage III CKD, coronary artery disease, atrial fibrillation on anticoagulation presented hospital after sustaining a fall at a store with difficulty bearing weight on the right lower extremity. MD assessment includes: femoral neck fractuce s/p R hip hemiarthroplasty 12/14.   PT Comments    Pt was pleasant and motivated to participate during the session and put forth good effort throughout. Pt received in recliner on 2L/O2.  Pt was able to complete all ther ex in recliner and supine in bed with resistance. PT tried resting with room air but pt's SpO2 struggled to stay above 90% and would drop into the mid to upper 80s. Pt was placed back on 2L/O2 and SpO2 quickly recovered to mid to upper 90s. Pt was able to STS with min assist and did not experience any adverse symptoms. Pt was able to ambulate ~281ft with min guard for safety and RW usage. Pt demonstrated a safe and consistent gait pattern and was able to slow cadence upon PT request. Pt completed safe and proper stair sequence with bilat rails and min guard for safety. SpO2 remained in mid to upper 90s throughout ambulation and HR remained WNL during session. Pt will benefit from HHPT upon discharge to safely address deficits listed in patient problem list for decreased caregiver assistance and eventual return to PLOF.   Recommendations for follow up therapy are one component of a multi-disciplinary discharge planning process, led by the attending physician.  Recommendations may be updated based on patient status, additional functional criteria and insurance authorization.  Follow Up Recommendations  Home health PT     Assistance Recommended at Discharge Intermittent Supervision/Assistance  Equipment Recommendations  Rolling walker  (2 wheels)    Recommendations for Other Services       Precautions / Restrictions Precautions Precautions: Anterior Hip;Fall Precaution Booklet Issued: Yes (comment) Restrictions Weight Bearing Restrictions: Yes RLE Weight Bearing: Weight bearing as tolerated     Mobility  Bed Mobility Overal bed mobility: Modified Independent             General bed mobility comments: Pt was able to complete bed mobility without any help from PT or use of siderails    Transfers Overall transfer level: Needs assistance Equipment used: Rolling walker (2 wheels) Transfers: Sit to/from Stand Sit to Stand: Min assist           General transfer comment: Increased time and effort, min assist with STS due to weakness    Ambulation/Gait Ambulation/Gait assistance: Min guard Gait Distance (Feet): 200 Feet Assistive device: Rolling walker (2 wheels) Gait Pattern/deviations: Step-through pattern;Decreased step length - right;Decreased step length - left;Decreased stride length Gait velocity: normal     General Gait Details: Pt demonstrated consistent and safe cadence with RW, pt did not report pain and did not experince any LOB, pt began to feel fatigued toward the end of ambulation   Stairs Stairs: Yes Stairs assistance: Min guard Stair Management: Two rails;Forwards Number of Stairs: 4 General stair comments: Able to complete proper and safe sequencing with min guard for safety   Wheelchair Mobility    Modified Rankin (Stroke Patients Only)       Balance Overall balance assessment: Needs assistance Sitting-balance support: Bilateral upper extremity supported;Feet supported Sitting balance-Leahy Scale: Good     Standing balance support: Bilateral upper extremity  supported;During functional activity Standing balance-Leahy Scale: Good Standing balance comment: PT noted not posterior lean when standing from recliner                            Cognition  Arousal/Alertness: Awake/alert Behavior During Therapy: WFL for tasks assessed/performed Overall Cognitive Status: Within Functional Limits for tasks assessed                                          Exercises Total Joint Exercises Ankle Circles/Pumps: AROM;Both;10 reps;Seated Quad Sets: AROM;Both;10 reps;Seated Gluteal Sets: AROM;Both;10 reps;Seated Straight Leg Raises: AROM;Both;10 reps;Seated Knee Flexion: AROM;Both;10 reps;Supine (with resistance) Marching in Standing: AROM;Both;10 reps;Standing General Exercises - Lower Extremity Hip ABduction/ADduction: AROM;Both;10 reps;Supine Other Exercises Other Exercises: Pt education on anterior hip precaution and HEP booklet Other Exercises: Pt education on HHPT versus OPPT    General Comments        Pertinent Vitals/Pain Pain Assessment: No/denies pain    Home Living Family/patient expects to be discharged to:: Private residence Living Arrangements: Spouse/significant other (Wife) Available Help at Discharge: Family;Available PRN/intermittently (Wife) Type of Home: House Home Access: Level entry     Alternate Level Stairs-Number of Steps: 14 Home Layout: Two level;Able to live on main level with bedroom/bathroom;Full bath on main level (Lives on 1st flor, 2nd flor is for storage) Home Equipment: BSC/3in1;Rollator (4 wheels);Cane - single point      Prior Function            PT Goals (current goals can now be found in the care plan section) Acute Rehab PT Goals Patient Stated Goal: to get back to normal PT Goal Formulation: With patient Time For Goal Achievement: 07/23/21 Potential to Achieve Goals: Good Progress towards PT goals: Progressing toward goals    Frequency    BID      PT Plan Current plan remains appropriate    Co-evaluation              AM-PAC PT "6 Clicks" Mobility   Outcome Measure  Help needed turning from your back to your side while in a flat bed without using  bedrails?: A Little Help needed moving from lying on your back to sitting on the side of a flat bed without using bedrails?: A Little Help needed moving to and from a bed to a chair (including a wheelchair)?: A Little Help needed standing up from a chair using your arms (e.g., wheelchair or bedside chair)?: A Little Help needed to walk in hospital room?: A Little Help needed climbing 3-5 steps with a railing? : A Little 6 Click Score: 18    End of Session Equipment Utilized During Treatment: Gait belt;Oxygen Activity Tolerance: Patient tolerated treatment well Patient left: in bed;with call bell/phone within reach;with bed alarm set;with family/visitor present;with SCD's reapplied (Wife in room) Nurse Communication: Mobility status PT Visit Diagnosis: Other abnormalities of gait and mobility (R26.89);History of falling (Z91.81);Muscle weakness (generalized) (M62.81);Pain Pain - Right/Left: Right Pain - part of body: Hip     Time: [id number] PT Time Calculation (min) (ACUTE ONLY): 45 min  Charges:                      Sheldon Silvan SPT 07/10/21, 4:54 PM

## 2021-07-10 NOTE — Evaluation (Signed)
Physical Therapy Evaluation Patient Details Name: Caleb Khan MRN: [id number] DOB: 1927-09-07 Today's Date: 07/10/2021  History of Present Illness  Pt is a 87 y.o. male with past medical history of spinal stenosis, stage III CKD, coronary artery disease, atrial fibrillation on anticoagulation presented hospital after sustaining a fall at a store with difficulty bearing weight on the right lower extremity. MD assessment includes: femoral neck fractuce s/p R hip hemiarthroplasty 12/14.   Clinical Impression  Pt was pleasant and motivated to participate during the session and put forth good effort throughout. Pt was able to complete bed mobility with modified independence and use of bedrails. Pt was able to STS with min assist due to weakness and PT noted a slight posterior lean upon standing but pt was able to self correct. Pt did not report any adverse symptoms in standing. Pt demonstrated adequate static and dynamic balance at EOB while using urinal. Pt was able to ambulate ~241ft with RW and min guard for safety on 2L/O2. Pt demonstrated a consistent and safe gait pattern and did not experience any LOB. Toward the end of ambulation, pt reported feeling fatigued. SpO2 and HR remained WNL throughout the session. Pt will benefit from HHPT upon discharge to safely address deficits listed in patient problem list for decreased caregiver assistance and eventual return to PLOF.   Recommendations for follow up therapy are one component of a multi-disciplinary discharge planning process, led by the attending physician.  Recommendations may be updated based on patient status, additional functional criteria and insurance authorization.  Follow Up Recommendations Home health PT    Assistance Recommended at Discharge Intermittent Supervision/Assistance  Functional Status Assessment Patient has had a recent decline in their functional status and demonstrates the ability to make significant improvements in  function in a reasonable and predictable amount of time.  Equipment Recommendations  Rolling walker (2 wheels)    Recommendations for Other Services       Precautions / Restrictions Precautions Precautions: Anterior Hip;Fall Precaution Booklet Issued: Yes (comment) Restrictions Weight Bearing Restrictions: Yes RLE Weight Bearing: Weight bearing as tolerated      Mobility  Bed Mobility Overal bed mobility: Modified Independent             General bed mobility comments: Pt was able to complete bed mobility without any help from PT or use of siderails    Transfers Overall transfer level: Needs assistance Equipment used: Rolling walker (2 wheels) Transfers: Sit to/from Stand Sit to Stand: Min assist           General transfer comment: Increased time and effort, min assist with STS due to weakness, PT noted posterior lean upon standing but pt was able to self correct and use urinal    Ambulation/Gait Ambulation/Gait assistance: Min guard Gait Distance (Feet): 200 Feet Assistive device: Rolling walker (2 wheels) Gait Pattern/deviations: Step-through pattern;Decreased step length - right;Decreased step length - left;Decreased stride length Gait velocity: normal     General Gait Details: Pt demonstrated consistent and safe cadence with RW, pt did not report pain and did not experince any LOB, pt began to feel fatigued toward the end of ambulation  Stairs            Wheelchair Mobility    Modified Rankin (Stroke Patients Only)       Balance Overall balance assessment: Needs assistance Sitting-balance support: Bilateral upper extremity supported;Feet supported Sitting balance-Leahy Scale: Good     Standing balance support: Bilateral upper extremity supported;During functional activity Standing balance-Leahy  Scale: Good Standing balance comment: PT noted slight posterior lean upon standing but pt was able to self correct                              Pertinent Vitals/Pain Pain Assessment: No/denies pain    Home Living Family/patient expects to be discharged to:: Private residence Living Arrangements: Spouse/significant other (Wife) Available Help at Discharge: Family;Available PRN/intermittently (Wife) Type of Home: House Home Access: Level entry     Alternate Level Stairs-Number of Steps: 14 Home Layout: Two level;Able to live on main level with bedroom/bathroom;Full bath on main level (Lives on 1st flor, 2nd flor is for storage) Home Equipment: BSC/3in1;Rollator (4 wheels);Cane - single point      Prior Function Prior Level of Function : Independent/Modified Independent;History of Falls (last six months)             Mobility Comments: Pt reports being mod independent with mobility, uses SPC in community, no AD at home claims he walks 3/8ths of a mile 2x per day., reports falling 3 times in the past 6 months ADLs Comments: Pt reports being independent with ADLs     Hand Dominance        Extremity/Trunk Assessment   Upper Extremity Assessment Upper Extremity Assessment: Generalized weakness    Lower Extremity Assessment Lower Extremity Assessment: Generalized weakness;RLE deficits/detail RLE Deficits / Details: R hip fx s/p R hip hemiarthroplasty       Communication   Communication: No difficulties  Cognition Arousal/Alertness: Awake/alert Behavior During Therapy: WFL for tasks assessed/performed Overall Cognitive Status: Within Functional Limits for tasks assessed                                          General Comments      Exercises Total Joint Exercises Ankle Circles/Pumps: AROM;Both;10 reps;Seated Quad Sets: AROM;Both;10 reps;Seated Gluteal Sets: AROM;Both;10 reps;Seated Marching in Standing: AROM;Both;10 reps;Standing Other Exercises Other Exercises: Pt education on HEP of APs, QS, and GS 10-20x every hour Other Exercises: Pt and spouse education on discharge  recommendation   Assessment/Plan    PT Assessment Patient needs continued PT services  PT Problem List Decreased strength;Decreased activity tolerance;Decreased balance;Decreased mobility;Decreased knowledge of use of DME;Pain       PT Treatment Interventions DME instruction;Gait training;Stair training;Functional mobility training;Therapeutic activities;Therapeutic exercise;Balance training;Patient/family education    PT Goals (Current goals can be found in the Care Plan section)  Acute Rehab PT Goals Patient Stated Goal: to get back to normal PT Goal Formulation: With patient Time For Goal Achievement: 07/23/21 Potential to Achieve Goals: Good    Frequency BID   Barriers to discharge        Co-evaluation               AM-PAC PT "6 Clicks" Mobility  Outcome Measure Help needed turning from your back to your side while in a flat bed without using bedrails?: A Little Help needed moving from lying on your back to sitting on the side of a flat bed without using bedrails?: A Little Help needed moving to and from a bed to a chair (including a wheelchair)?: A Little Help needed standing up from a chair using your arms (e.g., wheelchair or bedside chair)?: A Little Help needed to walk in hospital room?: A Little Help needed climbing 3-5 steps with a railing? :  A Little 6 Click Score: 18    End of Session Equipment Utilized During Treatment: Gait belt;Oxygen Activity Tolerance: Patient tolerated treatment well Patient left: in chair;with call bell/phone within reach;with chair alarm set;with family/visitor present;with SCD's reapplied (Wife in room) Nurse Communication: Mobility status PT Visit Diagnosis: Other abnormalities of gait and mobility (R26.89);History of falling (Z91.81);Muscle weakness (generalized) (M62.81);Pain Pain - Right/Left: Right Pain - part of body: Hip    Time: 1021-1105 PT Time Calculation (min) (ACUTE ONLY): 44 min   Charges:               Hildred Alamin SPT 07/10/21, 1:31 PM

## 2021-07-10 NOTE — Progress Notes (Signed)
Subjective:  Patient reports pain as mild.    Objective:   VITALS:   Vitals:   07/09/21 1541 07/09/21 1953 07/09/21 2309 07/10/21 0420  BP: (!) 100/50 (!) 108/57 (!) 108/57 109/80  Pulse: 63 61 (!) 54 (!) 58  Resp: 16 20 20 20   Temp: 98 F (36.7 C) 98 F (36.7 C) 98.3 F (36.8 C) 97.9 F (36.6 C)  TempSrc:      SpO2: 94% 90% 98% 94%  Weight:      Height:        PHYSICAL EXAM:  Neurologically intact ABD soft Neurovascular intact Sensation intact distally Intact pulses distally Dorsiflexion/Plantar flexion intact Incision: dressing C/D/I No cellulitis present Compartment soft  LABS  Results for orders placed or performed during the hospital encounter of 07/07/21 (from the past 24 hour(s))  CBC     Status: Abnormal   Collection Time: 07/10/21  4:15 AM  Result Value Ref Range   WBC 13.5 (H) 4.0 - 10.5 K/uL   RBC 3.51 (L) 4.22 - 5.81 MIL/uL   Hemoglobin 11.3 (L) 13.0 - 17.0 g/dL   HCT 32.6 (L) 39.0 - 52.0 %   MCV 92.9 80.0 - 100.0 fL   MCH 32.2 26.0 - 34.0 pg   MCHC 34.7 30.0 - 36.0 g/dL   RDW 14.6 11.5 - 15.5 %   Platelets 197 150 - 400 K/uL   nRBC 0.0 0.0 - 0.2 %  Basic metabolic panel     Status: Abnormal   Collection Time: 07/10/21  4:15 AM  Result Value Ref Range   Sodium 130 (L) 135 - 145 mmol/L   Potassium 4.6 3.5 - 5.1 mmol/L   Chloride 95 (L) 98 - 111 mmol/L   CO2 24 22 - 32 mmol/L   Glucose, Bld 131 (H) 70 - 99 mg/dL   BUN 45 (H) 8 - 23 mg/dL   Creatinine, Ser 1.99 (H) 0.61 - 1.24 mg/dL   Calcium 8.0 (L) 8.9 - 10.3 mg/dL   GFR, Estimated 31 (L) >60 mL/min   Anion gap 11 5 - 15    DG C-Arm 1-60 Min-No Report  Result Date: 07/09/2021 CLINICAL DATA:  Status post right total hip replacement. EXAM: DG HIP (WITH OR WITHOUT PELVIS) 1V RIGHT; DG C-ARM 1-60 MIN-NO REPORT Radiation exposure index: 0.8181 mGy. COMPARISON:  July 07, 2021. FINDINGS: Two intraoperative fluoroscopic images were obtained of the right hip. These demonstrate the right  acetabular and femoral components to be in grossly good position. IMPRESSION: Fluoroscopic guidance provided during right total hip arthroplasty. Electronically Signed   By: Marijo Conception M.D.   On: 07/09/2021 13:48   DG HIP UNILAT WITH PELVIS 1V RIGHT  Result Date: 07/09/2021 CLINICAL DATA:  Status post right total hip replacement. EXAM: DG HIP (WITH OR WITHOUT PELVIS) 1V RIGHT; DG C-ARM 1-60 MIN-NO REPORT Radiation exposure index: 0.8181 mGy. COMPARISON:  July 07, 2021. FINDINGS: Two intraoperative fluoroscopic images were obtained of the right hip. These demonstrate the right acetabular and femoral components to be in grossly good position. IMPRESSION: Fluoroscopic guidance provided during right total hip arthroplasty. Electronically Signed   By: Marijo Conception M.D.   On: 07/09/2021 13:48    Assessment/Plan: 1 Day Post-Op   Principal Problem:   Femoral neck fracture ([place]) Active Problems:   CKD (chronic kidney disease), stage IIIb   COPD (chronic obstructive pulmonary disease) (HCC)   Coronary artery disease   Hypertension   Polymyalgia rheumatica (HCC)   Spinal stenosis  Fall   Advance diet Up with therapy Discharge per hospitalist Continue Eliquis (home dose) Hydrocodone for pain  (rx in chart) WBAT RLE Follow up in office in 2 weeks for staple removal 07/22/21 Call office to confirm appointment [phone]   Altamese Cabal , PA-C 07/10/2021, 7:02 AM

## 2021-07-10 NOTE — Discharge Instructions (Signed)

## 2021-07-10 NOTE — Progress Notes (Signed)
PROGRESS NOTE  Caleb Khan [id number] DOB: 21-Apr-1928 DOA: 07/07/2021 PCP: Dion Body, MD   LOS: 3 days   Brief narrative:  Caleb Khan is a 87 y.o. male with past medical history of spinal stenosis, stage III CKD, coronary artery disease, atrial fibrillation on anticoagulation presented hospital after sustaining a fall at a store with difficulty bearing weight on the right lower extremity.  EMS was called in and patient was brought into the hospital.  Initial white count was elevated at 15,000.  Right hip showed acute mildly impacted right femoral neck fracture.  Right elbow x-ray showed no acute osseous abnormality.  Patient was then admitted hospital for further evaluation and treatment   Assessment/Plan:  Principal Problem:   Femoral neck fracture ([place]) Active Problems:   CKD (chronic kidney disease), stage IIIb   COPD (chronic obstructive pulmonary disease) (HCC)   Coronary artery disease   Hypertension   Polymyalgia rheumatica ([place])   Spinal stenosis   Fall   Close traumatic right femoral neck fracture:  Status post right hip hemiarthroplasty 12/14 Weightbearing as tolerated Adequate pain medications Eliquis for DVT prophylaxis PT OT to start rehab assessment today.  Chronic atrial fibrillation: Continue amiodarone.  Rate controlled in sinus rhythm today.  Eliquis resumed.  Leukocytosis: Mostly reactive.  On chronic steroids.    Coronary artery disease: Is stable.  Aspirin and Eliquis resumed.  Essential hypertension: Stable on amlodipine and losartan.  Continue.    Polymyalgia rheumatica and a spinal stenosis: On chronic prednisone therapy continued.    Acute kidney injury with history of chronic kidney disease stage IIIb: Baseline creatinine about 1.3-1.4.  Evidence of some perioperative hypotension. Gentle IV fluids overnight 100 mL/h for 12 hours.  Recheck tomorrow morning.  Urine output is adequate.   DVT prophylaxis: apixaban (ELIQUIS) tablet  2.5 mg Start: 07/10/21 1000 SCDs Start: 07/09/21 1554 SCDs Start: 07/07/21 1422 apixaban (ELIQUIS) tablet 2.5 mg   Code Status: DNR  Family Communication: Wife at the bedside.  Status is: Inpatient  Remains inpatient appropriate because: Immediate postop.  Consultants: Orthopedics  Procedures: None  Anti-infectives:  None, preoperative antibiotics given.  Anti-infectives (From admission, onward)    Start     Dose/Rate Route Frequency Ordered Stop   07/09/21 1645  ceFAZolin (ANCEF) IVPB 1 g/50 mL premix        1 g 100 mL/hr over 30 Minutes Intravenous Every 6 hours 07/09/21 1553 07/10/21 0141   07/08/21 1316  ceFAZolin (ANCEF) IVPB 2g/100 mL premix        2 g 200 mL/hr over 30 Minutes Intravenous 30 min pre-op 07/08/21 1316 07/09/21 1047       Subjective: Seen and examined.  No overnight events.  Wife at the bedside.  Very mild pain at rest not needing any pain medications.  He stood at the edge of the bed with minimal pain and bearing weight on the right side.  He is very eager to work with rehab.  Wants to go home ultimately.  Objective: Vitals:   07/10/21 0840 07/10/21 1140  BP: (!) 117/55 110/60  Pulse: 64 (!) 45  Resp: 18 18  Temp: 98.7 F (37.1 C) 98.4 F (36.9 C)  SpO2: 92% 93%    Intake/Output Summary (Last 24 hours) at 07/10/2021 1217 Last data filed at 07/10/2021 0420 Gross per 24 hour  Intake 60 ml  Output 350 ml  Net -290 ml   Filed Weights   07/07/21 1120 07/09/21 0857  Weight: 63.3 kg 63.3 kg  Body mass index is 24.72 kg/m.   Physical Exam:  General: Looks comfortable.  Laying in the bed. Cardiovascular: S1-S2 normal.  Irregularly irregular.  No murmurs. Respiratory: Bilateral clear. Gastrointestinal: Soft and nontender. Ext: Right anterior thigh incision clean and dry.  Distal neurovascular status intact.  Minimally tender along the incision line expected from postop.  Data Review: I have personally reviewed the following  laboratory data and studies,  CBC: Recent Labs  Lab 07/07/21 1417 07/08/21 0257 07/09/21 0138 07/10/21 0415  WBC 15.1* 10.9* 10.3 13.5*  NEUTROABS 13.2*  --   --   --   HGB 13.6 11.5* 12.6* 11.3*  HCT 40.3 34.0* 36.1* 32.6*  MCV 94.8 92.9 92.3 92.9  PLT 260 227 205 [id number]   Basic Metabolic Panel: Recent Labs  Lab 07/07/21 1417 07/08/21 0257 07/09/21 0138 07/10/21 0415  NA 135 133* 132* 130*  K 4.5 4.6 4.2 4.6  CL 103 102 99 95*  CO2 23 24 26 24   GLUCOSE 95 105* 97 131*  BUN 34* 37* 33* 45*  CREATININE 1.38* 1.38* 1.47* 1.99*  CALCIUM 9.1 8.4* 8.4* 8.0*  MG  --   --  2.1  --    Liver Function Tests: No results for input(s): AST, ALT, ALKPHOS, BILITOT, PROT, ALBUMIN in the last 168 hours. No results for input(s): LIPASE, AMYLASE in the last 168 hours. No results for input(s): AMMONIA in the last 168 hours. Cardiac Enzymes: No results for input(s): CKTOTAL, CKMB, CKMBINDEX, TROPONINI in the last 168 hours. BNP (last 3 results) Recent Labs    01/22/21 0632  BNP 423.2*    ProBNP (last 3 results) No results for input(s): PROBNP in the last 8760 hours.  CBG: No results for input(s): GLUCAP in the last 168 hours. Recent Results (from the past 240 hour(s))  Resp Panel by RT-PCR (Flu A&B, Covid) Nasopharyngeal Swab     Status: None   Collection Time: 07/07/21  2:25 PM   Specimen: Nasopharyngeal Swab; Nasopharyngeal(NP) swabs in vial transport medium  Result Value Ref Range Status   SARS Coronavirus 2 by RT PCR NEGATIVE NEGATIVE Final    Comment: (NOTE) SARS-CoV-2 target nucleic acids are NOT DETECTED.  The SARS-CoV-2 RNA is generally detectable in upper respiratory specimens during the acute phase of infection. The lowest concentration of SARS-CoV-2 viral copies this assay can detect is 138 copies/mL. A negative result does not preclude SARS-Cov-2 infection and should not be used as the sole basis for treatment or other patient management decisions. A negative  result may occur with  improper specimen collection/handling, submission of specimen other than nasopharyngeal swab, presence of viral mutation(s) within the areas targeted by this assay, and inadequate number of viral copies(<138 copies/mL). A negative result must be combined with clinical observations, patient history, and epidemiological information. The expected result is Negative.  Fact Sheet for Patients:  EntrepreneurPulse.com.au  Fact Sheet for Healthcare Providers:  IncredibleEmployment.be  This test is no t yet approved or cleared by the [place] FDA and  has been authorized for detection and/or diagnosis of SARS-CoV-2 by FDA under an Emergency Use Authorization (EUA). This EUA will remain  in effect (meaning this test can be used) for the duration of the COVID-19 declaration under Section 564(b)(1) of the Act, 21 U.S.C.section 360bbb-3(b)(1), unless the authorization is terminated  or revoked sooner.       Influenza A by PCR NEGATIVE NEGATIVE Final   Influenza B by PCR NEGATIVE NEGATIVE Final    Comment: (NOTE) The  Xpert Xpress SARS-CoV-2/FLU/RSV plus assay is intended as an aid in the diagnosis of influenza from Nasopharyngeal swab specimens and should not be used as a sole basis for treatment. Nasal washings and aspirates are unacceptable for Xpert Xpress SARS-CoV-2/FLU/RSV testing.  Fact Sheet for Patients: BloggerCourse.com  Fact Sheet for Healthcare Providers: SeriousBroker.it  This test is not yet approved or cleared by the [place] FDA and has been authorized for detection and/or diagnosis of SARS-CoV-2 by FDA under an Emergency Use Authorization (EUA). This EUA will remain in effect (meaning this test can be used) for the duration of the COVID-19 declaration under Section 564(b)(1) of the Act, 21 U.S.C. section 360bbb-3(b)(1), unless the authorization is  terminated or revoked.  Performed at [facility], [address] [place], [place] [address]      Studies: DG C-Arm 1-60 Min-No Report  Result Date: 07/09/2021 CLINICAL DATA:  Status post right total hip replacement. EXAM: DG HIP (WITH OR WITHOUT PELVIS) 1V RIGHT; DG C-ARM 1-60 MIN-NO REPORT Radiation exposure index: 0.8181 mGy. COMPARISON:  July 07, 2021. FINDINGS: Two intraoperative fluoroscopic images were obtained of the right hip. These demonstrate the right acetabular and femoral components to be in grossly good position. IMPRESSION: Fluoroscopic guidance provided during right total hip arthroplasty. Electronically Signed   By: Lupita Raider M.D.   On: 07/09/2021 13:48   DG HIP UNILAT WITH PELVIS 1V RIGHT  Result Date: 07/09/2021 CLINICAL DATA:  Status post right total hip replacement. EXAM: DG HIP (WITH OR WITHOUT PELVIS) 1V RIGHT; DG C-ARM 1-60 MIN-NO REPORT Radiation exposure index: 0.8181 mGy. COMPARISON:  July 07, 2021. FINDINGS: Two intraoperative fluoroscopic images were obtained of the right hip. These demonstrate the right acetabular and femoral components to be in grossly good position. IMPRESSION: Fluoroscopic guidance provided during right total hip arthroplasty. Electronically Signed   By: Lupita Raider M.D.   On: 07/09/2021 13:48    Total time spent: 25 minutes  Dorcas Carrow, MD  [facility] 07/10/2021  If 7PM-7AM, please contact night-coverage

## 2021-07-10 NOTE — TOC Progression Note (Addendum)
Transition of Care [facility]) - Progression Note    Patient Details  Name: Caleb Khan MRN: [id number] Date of Birth: 06/10/1928  Transition of [facility]) CM/SW Contact  Marlowe Sax, RN Phone Number: 07/10/2021, 12:53 PM  Clinical Narrative:   The patient lives at [facility] in independent living with his wife, He wants to go home with [facility] PT, He has a Museum/gallery exhibitions officer and a BSC at home, needs a RW, [employer] will deliver to the bedside prior to DC, I set him up with [facility] for [facility]       Expected Discharge Plan and Services                                                 Social Determinants of Health (SDOH) Interventions    Readmission Risk Interventions No flowsheet data found.

## 2021-07-10 NOTE — [place] FL2 (Signed)
[place] MEDICAID FL2 LEVEL OF CARE SCREENING TOOL     IDENTIFICATION  Patient Name: Caleb Khan Birthdate: 1927/12/16 Sex: male Admission Date (Current Location): 07/07/2021  [facility] and [place] Number:  Chiropodist and Address:  [facility], [address] [place], [place] [address]      Provider Number: [id number]  Attending Physician Name and Address:  Dorcas Carrow, MD  Relative Name and Phone Number:  Zigmund Daniel [phone]    Current Level of Care: Hospital Recommended Level of Care: Skilled Nursing Facility Prior Approval Number:    Date Approved/Denied:   PASRR Number: [id number]  Discharge Plan: SNF    Current Diagnoses: Patient Active Problem List   Diagnosis Date Noted   Femoral neck fracture (HCC) 07/07/2021   Spinal stenosis    Fall    Chest pain 01/22/2021   CKD (chronic kidney disease), stage IIIb 01/22/2021   COPD (chronic obstructive pulmonary disease) (HCC)    Coronary artery disease    Hypertension    Atrial fibrillation with RVR (HCC)    Polymyalgia rheumatica (HCC)    Sepsis (HCC) 03/17/2018    Orientation RESPIRATION BLADDER Height & Weight     Self, Time, Situation, Place  Normal Continent Weight: 63.3 kg Height:  5\' 3"  (160 cm)  BEHAVIORAL SYMPTOMS/MOOD NEUROLOGICAL BOWEL NUTRITION STATUS      Continent Diet (regular)  AMBULATORY STATUS COMMUNICATION OF NEEDS Skin   Extensive Assist Verbally Normal, Surgical wounds                       Personal Care Assistance Level of Assistance  Bathing, Feeding, Dressing Bathing Assistance: Limited assistance Feeding assistance: Independent Dressing Assistance: Limited assistance     Functional Limitations Info             SPECIAL CARE FACTORS FREQUENCY  PT (By licensed PT)     PT Frequency: 5 times per week              Contractures Contractures Info: Not present    Additional Factors Info  Allergies, Code Status Code Status  Info: DNR Allergies Info: Benazepril           Current Medications (07/10/2021):  This is the current hospital active medication list Current Facility-Administered Medications  Medication Dose Route Frequency Provider Last Rate Last Admin   acetaminophen (TYLENOL) tablet 1,000 mg  1,000 mg Oral Q8H PRN 07/12/2021, MD       alum & mag hydroxide-simeth (MAALOX/MYLANTA) 200-200-20 MG/5ML suspension 30 mL  30 mL Oral Q4H PRN 02-02-2001, MD       amiodarone (PACERONE) tablet 100 mg  100 mg Oral Daily Lyndle Herrlich, MD   100 mg at 07/10/21 0908   amLODipine (NORVASC) tablet 10 mg  10 mg Oral Daily 07/12/21, MD   10 mg at 07/10/21 07/12/21   apixaban (ELIQUIS) tablet 2.5 mg  2.5 mg Oral BID 8469, MD   2.5 mg at 07/10/21 07/12/21   bisacodyl (DULCOLAX) suppository 10 mg  10 mg Rectal Daily PRN 6295, MD       calcium-vitamin D Lyndle Herrlich WITH D) 500-5 MG-MCG per tablet 1 tablet  1 tablet Oral Daily Ruthell Rummage, MD   1 tablet at 07/10/21 07/12/21   docusate sodium (COLACE) capsule 100 mg  100 mg Oral BID 2841, MD   100 mg at 07/10/21 0909   gabapentin (NEURONTIN) capsule 100  mg  100 mg Oral Daily Lovell Sheehan, MD   100 mg at 07/10/21 0908   HYDROcodone-acetaminophen (NORCO/VICODIN) 5-325 MG per tablet 1-2 tablet  1-2 tablet Oral Q6H PRN Lovell Sheehan, MD   1 tablet at 07/10/21 [id number]   lactated ringers infusion   Intravenous Continuous Barb Merino, MD       losartan (COZAAR) tablet 50 mg  50 mg Oral Daily Lovell Sheehan, MD   50 mg at 07/10/21 [id number]   magnesium hydroxide (MILK OF MAGNESIA) suspension 30 mL  30 mL Oral Daily PRN Lovell Sheehan, MD   30 mL at 07/09/21 1633   menthol-cetylpyridinium (CEPACOL) lozenge 3 mg  1 lozenge Oral PRN Lovell Sheehan, MD       Or   phenol (CHLORASEPTIC) mouth spray 1 spray  1 spray Mouth/Throat PRN Lovell Sheehan, MD       methocarbamol (ROBAXIN) tablet 500 mg  500 mg Oral Q6H PRN Lovell Sheehan, MD   500 mg at  07/10/21 [id number]   Or   methocarbamol (ROBAXIN) 500 mg in dextrose 5 % 50 mL IVPB  500 mg Intravenous Q6H PRN Lovell Sheehan, MD       metoCLOPramide (REGLAN) tablet 5-10 mg  5-10 mg Oral Q8H PRN Lovell Sheehan, MD       Or   metoCLOPramide (REGLAN) injection 5-10 mg  5-10 mg Intravenous Q8H PRN Lovell Sheehan, MD       morphine 2 MG/ML injection 0.5 mg  0.5 mg Intravenous Q2H PRN Lovell Sheehan, MD       multivitamin with minerals tablet 1 tablet  1 tablet Oral Daily Lovell Sheehan, MD   1 tablet at 07/10/21 0908   multivitamin-lutein (OCUVITE-LUTEIN) capsule 1 capsule  1 capsule Oral Daily Lovell Sheehan, MD   1 capsule at 07/10/21 0909   nitroGLYCERIN (NITROSTAT) SL tablet 0.4 mg  0.4 mg Sublingual Q5 min PRN Lovell Sheehan, MD       ondansetron [facility]) tablet 4 mg  4 mg Oral Q6H PRN Lovell Sheehan, MD       Or   ondansetron [facility]) injection 4 mg  4 mg Intravenous Q6H PRN Lovell Sheehan, MD       pantoprazole (PROTONIX) EC tablet 40 mg  40 mg Oral Daily Lovell Sheehan, MD   40 mg at 07/10/21 [id number]   predniSONE (DELTASONE) tablet 5 mg  5 mg Oral Q breakfast Lovell Sheehan, MD   5 mg at 07/10/21 0909   psyllium (HYDROCIL/METAMUCIL) 1 packet  1 packet Oral Daily Lovell Sheehan, MD   1 packet at 07/10/21 1008   zolpidem (AMBIEN) tablet 5 mg  5 mg Oral QHS PRN Lovell Sheehan, MD   5 mg at 07/08/21 2109     Discharge Medications: Please see discharge summary for a list of discharge medications.  Relevant Imaging Results:  Relevant Lab Results:   Additional Information [id number]  Conception Oms, RN

## 2021-07-11 ENCOUNTER — Inpatient Hospital Stay: Payer: Medicare PPO

## 2021-07-11 LAB — BASIC METABOLIC PANEL
Anion gap: 6 (ref 5–15)
BUN: 54 mg/dL — ABNORMAL HIGH (ref 8–23)
CO2: 25 mmol/L (ref 22–32)
Calcium: 8.1 mg/dL — ABNORMAL LOW (ref 8.9–10.3)
Chloride: 96 mmol/L — ABNORMAL LOW (ref 98–111)
Creatinine, Ser: 1.78 mg/dL — ABNORMAL HIGH (ref 0.61–1.24)
GFR, Estimated: 36 mL/min — ABNORMAL LOW (ref >60)
Glucose, Bld: 95 mg/dL (ref 70–99)
Potassium: 4.8 mmol/L (ref 3.5–5.1)
Sodium: 127 mmol/L — ABNORMAL LOW (ref 135–145)

## 2021-07-11 LAB — CBC
HCT: 30.8 % — ABNORMAL LOW (ref 39.0–52.0)
Hemoglobin: 10.7 g/dL — ABNORMAL LOW (ref 13.0–17.0)
MCH: 32.1 pg (ref 26.0–34.0)
MCHC: 34.7 g/dL (ref 30.0–36.0)
MCV: 92.5 fL (ref 80.0–100.0)
Platelets: 203 10*3/uL (ref 150–400)
RBC: 3.33 MIL/uL — ABNORMAL LOW (ref 4.22–5.81)
RDW: 14.6 % (ref 11.5–15.5)
WBC: 10.4 10*3/uL (ref 4.0–10.5)
nRBC: 0 % (ref 0.0–0.2)

## 2021-07-11 LAB — SURGICAL PATHOLOGY

## 2021-07-11 MED ORDER — ALBUTEROL SULFATE (2.5 MG/3ML) 0.083% IN NEBU
2.5000 mg | INHALATION_SOLUTION | Freq: Four times a day (QID) | RESPIRATORY_TRACT | Status: DC | PRN
  Administered 2021-07-11: 2.5 mg via RESPIRATORY_TRACT
  Filled 2021-07-11: qty 3

## 2021-07-11 MED ORDER — FUROSEMIDE 10 MG/ML IJ SOLN
40.0000 mg | Freq: Once | INTRAMUSCULAR | Status: AC
  Administered 2021-07-11: 40 mg via INTRAVENOUS
  Filled 2021-07-11: qty 4

## 2021-07-11 NOTE — TOC Progression Note (Signed)
Transition of Care [facility]) - Progression Note    Patient Details  Name: Josimar Corning MRN: [id number] Date of Birth: [birth year]  Transition of Care [facility]) CM/SW [place], RN Phone Number: 07/11/2021, 4:03 PM  Clinical Narrative:    Met with the patient and his wife at the bedside, He is agreeable that if he has to he will go to STR SNF at [facility], I explained that the process will require to get insurance approval from [facility], The patient is managed by [facility] and the patient is found in the portal, The patient stated that in the morning they will decide first thing wether or not he will go home or to SNF, Likely leaning toward SNF,         Expected Discharge Plan and Services                                                 Social Determinants of Health (SDOH) Interventions    Readmission Risk Interventions No flowsheet data found.

## 2021-07-11 NOTE — Progress Notes (Signed)
Physical Therapy Treatment Patient Details Name: Caleb Khan MRN: [id number] DOB: 1927/09/09 Today's Date: 07/11/2021   History of Present Illness Pt is a 87 y.o. male with past medical history of spinal stenosis, stage III CKD, coronary artery disease, atrial fibrillation on anticoagulation presented hospital after sustaining a fall at a store with difficulty bearing weight on the right lower extremity. MD assessment includes: femoral neck fractuce s/p R hip hemiarthroplasty 12/14.   PT Comments    Pt was pleasant and motivated to participate during the session and put forth good effort throughout. MD came into room during treatment wanting to see how pt gets OOB with room air. Pt was able to complete bed mobility with increased time and effort and mod I using bedrails. PT could not get good pleth, but O2 was consistently reading mid 70s. Once back on 2L/O2, pt's SpO2 increased to upper 80s. Pt did not report any adverse symptoms. Pt was able to STS with min assist after 2 attempts. Pt reported feeling weaker than yesterday and was evident during ambulation. Pt struggled to take a short walk to hallway and return due to weakness with min guard for safety and experiencing SOB. Nursing notified and planning on using ear lobe SpO2. Pt did not report any symptoms. We will be seeing pt for PM session but will keep a close eye on him and the potential decline to recommendation of SNF. For the time being, keeping HHPT recommendation. Pt will benefit from HHPT upon discharge to safely address deficits listed in patient problem list for decreased caregiver assistance and eventual return to PLOF.   Recommendations for follow up therapy are one component of a multi-disciplinary discharge planning process, led by the attending physician.  Recommendations may be updated based on patient status, additional functional criteria and insurance authorization.  Follow Up Recommendations  Home health PT      Assistance Recommended at Discharge Intermittent Supervision/Assistance  Equipment Recommendations  Rolling walker (2 wheels)    Recommendations for Other Services       Precautions / Restrictions Precautions Precautions: Anterior Hip;Fall Precaution Booklet Issued: Yes (comment) Restrictions Weight Bearing Restrictions: Yes RLE Weight Bearing: Weight bearing as tolerated     Mobility  Bed Mobility Overal bed mobility: Modified Independent             General bed mobility comments: Pt was able to complete bed mobility without any help from PT but used berails, increased time and effort    Transfers Overall transfer level: Needs assistance Equipment used: Rolling walker (2 wheels) Transfers: Sit to/from Stand Sit to Stand: Min assist           General transfer comment: Increased time and effort, min assist with STS due to weakness, took 2 attempts to stand    Ambulation/Gait Ambulation/Gait assistance: Min guard Gait Distance (Feet): 30 Feet Assistive device: Rolling walker (2 wheels) Gait Pattern/deviations: Step-through pattern;Decreased step length - right;Decreased step length - left;Decreased stride length Gait velocity: decreased     General Gait Details: Pt noticibly weaker today, difficulty walking ~29ft, experienced SOB, min guard for safety, trouble locking out legs   Stairs             Wheelchair Mobility    Modified Rankin (Stroke Patients Only)       Balance Overall balance assessment: Needs assistance Sitting-balance support: Bilateral upper extremity supported;Feet supported Sitting balance-Leahy Scale: Good     Standing balance support: Bilateral upper extremity supported;During functional activity Standing balance-Leahy Scale: Fair  Standing balance comment: PT noted increased difficulty to remain standing today compare to yesterday's session                            Cognition Arousal/Alertness:  Awake/alert Behavior During Therapy: [facility] for tasks assessed/performed Overall Cognitive Status: Within Functional Limits for tasks assessed                                          Exercises Total Joint Exercises Ankle Circles/Pumps: AROM;Both;10 reps;Seated Straight Leg Raises: AROM;Both;10 reps;Supine Knee Flexion: AROM;Both;10 reps;Supine Other Exercises Other Exercises: Pt education on oxygen use and optimal level being >88% Other Exercises: Pt education on pursed lip breathing    General Comments        Pertinent Vitals/Pain Pain Assessment: No/denies pain    Home Living                          Prior Function            PT Goals (current goals can now be found in the care plan section) Acute Rehab PT Goals Patient Stated Goal: to get back to normal PT Goal Formulation: With patient Time For Goal Achievement: 07/23/21 Potential to Achieve Goals: Good Progress towards PT goals: Progressing toward goals    Frequency    BID      PT Plan Current plan remains appropriate    Co-evaluation              AM-PAC PT "6 Clicks" Mobility   Outcome Measure  Help needed turning from your back to your side while in a flat bed without using bedrails?: A Little Help needed moving from lying on your back to sitting on the side of a flat bed without using bedrails?: A Little Help needed moving to and from a bed to a chair (including a wheelchair)?: A Little Help needed standing up from a chair using your arms (e.g., wheelchair or bedside chair)?: A Little Help needed to walk in hospital room?: A Little Help needed climbing 3-5 steps with a railing? : A Little 6 Click Score: 18    End of Session Equipment Utilized During Treatment: Gait belt;Oxygen Activity Tolerance: Patient limited by fatigue Patient left: in chair;with call bell/phone within reach;with chair alarm set;with SCD's reapplied Nurse Communication: Mobility status;Other  (comment) (oxygen status) PT Visit Diagnosis: Other abnormalities of gait and mobility (R26.89);History of falling (Z91.81);Muscle weakness (generalized) (M62.81);Pain Pain - Right/Left: Right Pain - part of body: Hip     Time: [id number] PT Time Calculation (min) (ACUTE ONLY): 31 min  Charges:                        Sheldon Silvan SPT 07/11/21, 1:55 PM

## 2021-07-11 NOTE — Plan of Care (Signed)
°  Problem: Education: Goal: Knowledge of General Education information will improve Description: Including pain rating scale, medication(s)/side effects and non-pharmacologic comfort measures Outcome: Progressing   Problem: Health Behavior/Discharge Planning: Goal: Ability to manage health-related needs will improve Outcome: Progressing   Problem: Clinical Measurements: Goal: Ability to maintain clinical measurements within normal limits will improve Outcome: Progressing Goal: Will remain free from infection Outcome: Progressing Goal: Diagnostic test results will improve Outcome: Progressing Goal: Respiratory complications will improve Outcome: Progressing   Problem: Pain Managment: Goal: General experience of comfort will improve Outcome: Progressing   Problem: Safety: Goal: Ability to remain free from injury will improve Outcome: Progressing   Problem: Skin Integrity: Goal: Risk for impaired skin integrity will decrease Outcome: Progressing   Problem: Education: Goal: Verbalization of understanding the information provided (i.e., activity precautions, restrictions, etc) will improve Outcome: Progressing Goal: Individualized Educational Video(s) Outcome: Progressing   Problem: Self-Concept: Goal: Ability to maintain and perform role responsibilities to the fullest extent possible will improve Outcome: Progressing   Problem: Pain Management: Goal: Pain level will decrease Outcome: Progressing

## 2021-07-11 NOTE — Progress Notes (Signed)
Physical Therapy Treatment Patient Details Name: Caleb Khan MRN: [id number] DOB: 02/08/1928 Today's Date: 07/11/2021   History of Present Illness Pt is a 87 y.o. male with past medical history of spinal stenosis, stage III CKD, coronary artery disease, atrial fibrillation on anticoagulation presented hospital after sustaining a fall at a store with difficulty bearing weight on the right lower extremity. MD assessment includes: femoral neck fractuce s/p R hip hemiarthroplasty 12/14.   PT Comments    Pt was pleasant and motivated to participate during the session and put forth good effort throughout. Pt's SpO2 upon entering the room was 89%. Pt was able to complete all ther ex in bed with no adverse symptoms and SpO2 remaining in the high 80s. Pt completed bed mobility with mod independence and min cuing for hand and foot placement to complete rolling sequence. Upon sitting EOB, pt's SpO2 dropped to 86% but quickly recovered following pursed lip breathing to 90%. Pt completed STS with min assist due to BLE weakness. SpO2 remained in the low 90s. Pt demonstrated good static standing balance with occasional cuing to shift weight forward. Pt was able to complete forward and backward stepping sequence from EOB, but reported feeling tired and needing a rest break. SpO2 had briefly dropped to 87% but quickly recovered to low 90s. Pt reported feeling good to walk to doorway and return. Pt needed min guard for safety and demonstrated BLE weakness during ambulation. Pt reported 24ft walk felt hard, but SpO2 increased to 94%. Due to increased weakness compared to yesterday's sessions and fluctuating SpO2, pt will benefit from PT services in a SNF setting upon discharge to safely address deficits listed in patient problem list for decreased caregiver assistance and eventual return to PLOF.   Recommendations for follow up therapy are one component of a multi-disciplinary discharge planning process, led by the  attending physician.  Recommendations may be updated based on patient status, additional functional criteria and insurance authorization.  Follow Up Recommendations  Skilled nursing-short term rehab (<3 hours/day)     Assistance Recommended at Discharge Intermittent Supervision/Assistance  Equipment Recommendations  Rolling walker (2 wheels)    Recommendations for Other Services       Precautions / Restrictions Precautions Precautions: Anterior Hip;Fall Precaution Booklet Issued: Yes (comment) Restrictions Weight Bearing Restrictions: Yes RLE Weight Bearing: Weight bearing as tolerated     Mobility  Bed Mobility Overal bed mobility: Modified Independent             General bed mobility comments: Pt was able to complete bed mobility without any help from PT but used berails, increased time and effort    Transfers Overall transfer level: Needs assistance Equipment used: Rolling walker (2 wheels) Transfers: Sit to/from Stand Sit to Stand: Min assist           General transfer comment: Increased time and effort, min assist with STS due to weakness, took 2 attempts to stand    Ambulation/Gait Ambulation/Gait assistance: Min guard Gait Distance (Feet): 30 Feet Assistive device: Rolling walker (2 wheels) Gait Pattern/deviations: Step-through pattern;Decreased step length - right;Decreased step length - left;Decreased stride length Gait velocity: decreased     General Gait Details: Pt displayed the same amount of weakness during gait as AM session, started with forward and backward stepping from EOB, difficulty walking ~36ft to hallway, experienced SOB, min guard for safety, trouble locking out legs   Stairs             Wheelchair Mobility    Modified  Rankin (Stroke Patients Only)       Balance Overall balance assessment: Needs assistance Sitting-balance support: Bilateral upper extremity supported;Feet supported Sitting balance-Leahy Scale: Good      Standing balance support: Bilateral upper extremity supported;During functional activity Standing balance-Leahy Scale: Fair Standing balance comment: PT noted increased difficulty to remain standing during PM session as AM session                            Cognition Arousal/Alertness: Awake/alert Behavior During Therapy: WFL for tasks assessed/performed Overall Cognitive Status: Within Functional Limits for tasks assessed                                          Exercises Total Joint Exercises Ankle Circles/Pumps: AROM;Both;10 reps;Supine Quad Sets: AROM;Both;10 reps;Supine Straight Leg Raises: AROM;Both;10 reps;Supine Knee Flexion: AROM;Both;10 reps;Supine Other Exercises Other Exercises: Pt education on rolling in bed Other Exercises: Pt education on pursed lip breathing    General Comments        Pertinent Vitals/Pain Pain Assessment: No/denies pain    Home Living                          Prior Function            PT Goals (current goals can now be found in the care plan section) Acute Rehab PT Goals Patient Stated Goal: to get back to normal PT Goal Formulation: With patient Time For Goal Achievement: 07/23/21 Potential to Achieve Goals: Good Progress towards PT goals: Progressing toward goals    Frequency    BID      PT Plan Discharge plan needs to be updated    Co-evaluation              AM-PAC PT "6 Clicks" Mobility   Outcome Measure  Help needed turning from your back to your side while in a flat bed without using bedrails?: A Little Help needed moving from lying on your back to sitting on the side of a flat bed without using bedrails?: A Little Help needed moving to and from a bed to a chair (including a wheelchair)?: A Little Help needed standing up from a chair using your arms (e.g., wheelchair or bedside chair)?: A Little Help needed to walk in hospital room?: A Little Help needed climbing  3-5 steps with a railing? : A Lot 6 Click Score: 17    End of Session Equipment Utilized During Treatment: Gait belt;Oxygen Activity Tolerance: Patient limited by fatigue Patient left: in chair;with call bell/phone within reach;with chair alarm set;with SCD's reapplied Nurse Communication: Mobility status PT Visit Diagnosis: Other abnormalities of gait and mobility (R26.89);History of falling (Z91.81);Muscle weakness (generalized) (M62.81);Pain Pain - Right/Left: Right Pain - part of body: Hip     Time: 6644-0347 PT Time Calculation (min) (ACUTE ONLY): 27 min  Charges:                        Hildred Alamin SPT 07/11/21, 5:10 PM

## 2021-07-11 NOTE — Progress Notes (Signed)
Nutrition Follow-up  DOCUMENTATION CODES:   Not applicable  INTERVENTION:   -Continue Magic cup BID with meals, each supplement provides 290 kcal and 9 grams of protein  -Continue MVI with minerals daily  NUTRITION DIAGNOSIS:   Increased nutrient needs related to post-op healing as evidenced by estimated needs.  Ongoing  GOAL:   Patient will meet greater than or equal to 90% of their needs  Progressing   MONITOR:   PO intake, Supplement acceptance, Labs, Weight trends, Skin, I & O's  REASON FOR ASSESSMENT:   Malnutrition Screening Tool    ASSESSMENT:   Caleb Khan is a 87 y.o. male with medical history significant for spinal stenosis, stage III chronic kidney disease, coronary artery disease, A. fib on anticoagulation who presents to the ER for evaluation after he fell at the hardware store.  He said he tripped on his cart and fell landing on his right hip.  He was unable to bear weight on his right lower extremity and so EMS was called.  He also complains of pain to his right elbow.  12/14- s/p Procedure: Right Hip Anterior Hip Hemiarthroplasty   Reviewed I/O's: +1.3 L x 24 hours and +1.2 L since admission  UOP: 350 ml x 24 hours   Pt receiving nursing care at time of visit.   Pt currently on a regular diet with good appetite. Noted meal completions 25-100%.   Per TOC notes, plan to d/c with home health services once medically stable.   Medications reviewed and include oscal with vitamin D, colace, prednisone, and metamucil.   Labs reviewed: Na: 127.    Diet Order:   Diet Order             Diet regular Room service appropriate? Yes; Fluid consistency: Thin  Diet effective now                   EDUCATION NEEDS:   No education needs have been identified at this time  Skin:  Skin Assessment: Reviewed RN Assessment  Last BM:  Unknown  Height:   Ht Readings from Last 1 Encounters:  07/09/21 5\' 3"  (1.6 m)    Weight:   Wt Readings from Last  1 Encounters:  07/09/21 63.3 kg    Ideal Body Weight:  56.4 kg  BMI:  Body mass index is 24.72 kg/m.  Estimated Nutritional Needs:   Kcal:  1600-1800  Protein:  80-95 grams  Fluid:  > 1.6 L    07/11/21, RD, LDN, CDCES Registered Dietitian II Certified Diabetes Care and Education Specialist Please refer to [facility] for RD and/or RD on-call/weekend/after hours pager

## 2021-07-11 NOTE — Plan of Care (Signed)
°  Problem: Education: Goal: Knowledge of General Education information will improve Description: Including pain rating scale, medication(s)/side effects and non-pharmacologic comfort measures Outcome: Progressing   Problem: Pain Managment: Goal: General experience of comfort will improve Outcome: Progressing   Problem: Safety: Goal: Ability to remain free from injury will improve Outcome: Progressing   Problem: Skin Integrity: Goal: Risk for impaired skin integrity will decrease Outcome: Progressing   Problem: Pain Management: Goal: Pain level will decrease Outcome: Progressing

## 2021-07-11 NOTE — Progress Notes (Signed)
°  Subjective:  Patient reports pain as mild to moderate.    Objective:   VITALS:   Vitals:   07/11/21 0248 07/11/21 0729 07/11/21 1208 07/11/21 1257  BP: (!) 100/58 (!) 127/52 (!) 121/99   Pulse: 62 79 71   Resp: 15 18 18    Temp: 97.6 F (36.4 C) 99.4 F (37.4 C) 98.4 F (36.9 C)   TempSrc:      SpO2: 90% (!) 83% 99% 92%  Weight:      Height:        PHYSICAL EXAM:  Neurologically intact ABD soft Neurovascular intact Sensation intact distally Intact pulses distally Dorsiflexion/Plantar flexion intact Incision: scant drainage and drsg changed No cellulitis present Compartment soft  LABS  Results for orders placed or performed during the hospital encounter of 07/07/21 (from the past 24 hour(s))  CBC     Status: Abnormal   Collection Time: 07/11/21  5:42 AM  Result Value Ref Range   WBC 10.4 4.0 - 10.5 K/uL   RBC 3.33 (L) 4.22 - 5.81 MIL/uL   Hemoglobin 10.7 (L) 13.0 - 17.0 g/dL   HCT 07/13/21 (L) 97.6 - 73.4 %   MCV 92.5 80.0 - 100.0 fL   MCH 32.1 26.0 - 34.0 pg   MCHC 34.7 30.0 - 36.0 g/dL   RDW 19.3 79.0 - 24.0 %   Platelets 203 150 - 400 K/uL   nRBC 0.0 0.0 - 0.2 %  Basic metabolic panel     Status: Abnormal   Collection Time: 07/11/21  5:42 AM  Result Value Ref Range   Sodium 127 (L) 135 - 145 mmol/L   Potassium 4.8 3.5 - 5.1 mmol/L   Chloride 96 (L) 98 - 111 mmol/L   CO2 25 22 - 32 mmol/L   Glucose, Bld 95 70 - 99 mg/dL   BUN 54 (H) 8 - 23 mg/dL   Creatinine, Ser 07/13/21 (H) 0.61 - 1.24 mg/dL   Calcium 8.1 (L) 8.9 - 10.3 mg/dL   GFR, Estimated 36 (L) >60 mL/min   Anion gap 6 5 - 15    DG Chest Port 1 View  Result Date: 07/11/2021 CLINICAL DATA:  Hypoxia. EXAM: PORTABLE CHEST 1 VIEW COMPARISON:  01/22/2021 FINDINGS: The cardiac silhouette remains enlarged. Aortic atherosclerosis is noted. There is pulmonary vascular congestion with diffusely accentuated and indistinct interstitial markings. No sizable pleural effusion or pneumothorax is identified.  Biapical lung scarring and calcification are unchanged. A right shoulder arthroplasty is present with chronic periprosthetic lucency, incompletely evaluated. Advanced left glenohumeral osteoarthrosis is noted. IMPRESSION: Cardiomegaly with evidence of pulmonary edema. Electronically Signed   By: 01/24/2021 M.D.   On: 07/11/2021 14:57    Assessment/Plan: 2 Days Post-Op   Principal Problem:   Femoral neck fracture (HCC) Active Problems:   CKD (chronic kidney disease), stage IIIb   COPD (chronic obstructive pulmonary disease) (HCC)   Coronary artery disease   Hypertension   Polymyalgia rheumatica (HCC)   Spinal stenosis   Fall   Advance diet Up with therapy Discharge per hospitalist Most likely home with HHPT Continue Eliquis (home dose) Hydrocodone for pain  (rx in chart) WBAT RLE Follow up in office in 2 weeks for staple removal 07/22/21 Call office to confirm appointment [phone]  [phone] , PA-C 07/11/2021, 2:59 PM

## 2021-07-11 NOTE — Progress Notes (Signed)
PROGRESS NOTE  Caleb Khan [id number] DOB: June 13, 1928 DOA: 07/07/2021 PCP: Dion Body, MD   LOS: 4 days   Brief narrative:  Caleb Khan is a 87 y.o. male with past medical history of spinal stenosis, stage III CKD, coronary artery disease, atrial fibrillation on anticoagulation presented hospital after sustaining a fall at a store with difficulty bearing weight on the right lower extremity.  EMS was called in and patient was brought into the hospital.  Initial white count was elevated at 15,000.  Right hip showed acute mildly impacted right femoral neck fracture.  Right elbow x-ray showed no acute osseous abnormality.  Patient was then admitted hospital for further evaluation and treatment   Assessment/Plan:  Principal Problem:   Femoral neck fracture ([place]) Active Problems:   CKD (chronic kidney disease), stage IIIb   COPD (chronic obstructive pulmonary disease) (HCC)   Coronary artery disease   Hypertension   Polymyalgia rheumatica ([place])   Spinal stenosis   Fall   Close traumatic right femoral neck fracture:  Status post right hip hemiarthroplasty 12/14 Weightbearing as tolerated Adequate pain medications, mostly controlled with oral pain medications. Eliquis for DVT prophylaxis PT OT following.  Patient willing to go home with home health PT OT.  Acute hypoxemic respiratory failure: Possible fluid overload.  Known diastolic dysfunction.  Normal ejection fraction.  Lasix was on hold. Repeat chest x-ray today.  Keep on oxygen to keep saturation more than 90%. Will give 1 dose of IV Lasix and monitor.  Bronchodilator therapy.  Chronic atrial fibrillation: Continue amiodarone.  Rate controlled in sinus rhythm today.  Eliquis resumed.  Leukocytosis: Mostly reactive.  On chronic steroids.    Coronary artery disease: Is stable.  Aspirin and Eliquis resumed.  Essential hypertension: Stable on amlodipine and losartan.  Holding losartan today with acute kidney  injury.  Polymyalgia rheumatica and a spinal stenosis: On chronic prednisone therapy continued.    Acute kidney injury with history of chronic kidney disease stage IIIb: Baseline creatinine about 1.3-1.4.  Evidence of some perioperative hypotension. Gentle IV fluids overnight 100 mL/h for 12 hours.  Now with fluid overload.  Creatinine improving.  1 dose of Lasix.  Discontinue IV fluid. Urine output is adequate.   DVT prophylaxis: apixaban (ELIQUIS) tablet 2.5 mg Start: 07/10/21 1000 SCDs Start: 07/09/21 1554 SCDs Start: 07/07/21 1422 apixaban (ELIQUIS) tablet 2.5 mg   Code Status: DNR  Family Communication: None.  Status is: Inpatient  Remains inpatient appropriate because: Immediate postop.  Now with hypoxemia.  Consultants: Orthopedics  Procedures: None  Anti-infectives:  None, preoperative antibiotics given.  Anti-infectives (From admission, onward)    Start     Dose/Rate Route Frequency Ordered Stop   07/09/21 1645  ceFAZolin (ANCEF) IVPB 1 g/50 mL premix        1 g 100 mL/hr over 30 Minutes Intravenous Every 6 hours 07/09/21 1553 07/10/21 0141   07/08/21 1316  ceFAZolin (ANCEF) IVPB 2g/100 mL premix        2 g 200 mL/hr over 30 Minutes Intravenous 30 min pre-op 07/08/21 1316 07/09/21 1047       Subjective:  Patient was seen and examined.  No overnight events.  At rest he felt okay.  He was on 2 L oxygen.  Monitor patient working with therapist, he became very short of breath needing 3 L of oxygen and visibly uncomfortable. Denied any chest pain.  Pain is managed and was able to bear weight on right leg.  Objective: Vitals:   07/11/21 1208  07/11/21 1257  BP: (!) 121/99   Pulse: 71   Resp: 18   Temp: 98.4 F (36.9 C)   SpO2: 99% 92%    Intake/Output Summary (Last 24 hours) at 07/11/2021 1341 Last data filed at 07/11/2021 0321 Gross per 24 hour  Intake 1655 ml  Output 350 ml  Net 1305 ml   Filed Weights   07/07/21 1120 07/09/21 0857  Weight:  63.3 kg 63.3 kg   Body mass index is 24.72 kg/m.   Physical Exam:  General: Was comfortable at rest.  Slightly anxious on mobility. Cardiovascular: S1-S2 normal.  Irregularly irregular.  No murmurs. Respiratory: Bilateral clear.  Poor air entry at bases. Gastrointestinal: Soft and nontender. Ext: Right anterior thigh incision clean and dry.  Distal neurovascular status intact.  Minimally tender along the incision line expected from postop.  Data Review: I have personally reviewed the following laboratory data and studies,  CBC: Recent Labs  Lab 07/07/21 1417 07/08/21 0257 07/09/21 0138 07/10/21 0415 07/11/21 0542  WBC 15.1* 10.9* 10.3 13.5* 10.4  NEUTROABS 13.2*  --   --   --   --   HGB 13.6 11.5* 12.6* 11.3* 10.7*  HCT 40.3 34.0* 36.1* 32.6* 30.8*  MCV 94.8 92.9 92.3 92.9 92.5  PLT 260 227 205 197 [id number]   Basic Metabolic Panel: Recent Labs  Lab 07/07/21 1417 07/08/21 0257 07/09/21 0138 07/10/21 0415 07/11/21 0542  NA 135 133* 132* 130* 127*  K 4.5 4.6 4.2 4.6 4.8  CL 103 102 99 95* 96*  CO2 23 24 26 24 25   GLUCOSE 95 105* 97 131* 95  BUN 34* 37* 33* 45* 54*  CREATININE 1.38* 1.38* 1.47* 1.99* 1.78*  CALCIUM 9.1 8.4* 8.4* 8.0* 8.1*  MG  --   --  2.1  --   --    Liver Function Tests: No results for input(s): AST, ALT, ALKPHOS, BILITOT, PROT, ALBUMIN in the last 168 hours. No results for input(s): LIPASE, AMYLASE in the last 168 hours. No results for input(s): AMMONIA in the last 168 hours. Cardiac Enzymes: No results for input(s): CKTOTAL, CKMB, CKMBINDEX, TROPONINI in the last 168 hours. BNP (last 3 results) Recent Labs    01/22/21 0632  BNP 423.2*    ProBNP (last 3 results) No results for input(s): PROBNP in the last 8760 hours.  CBG: No results for input(s): GLUCAP in the last 168 hours. Recent Results (from the past 240 hour(s))  Resp Panel by RT-PCR (Flu A&B, Covid) Nasopharyngeal Swab     Status: None   Collection Time: 07/07/21  2:25 PM    Specimen: Nasopharyngeal Swab; Nasopharyngeal(NP) swabs in vial transport medium  Result Value Ref Range Status   SARS Coronavirus 2 by RT PCR NEGATIVE NEGATIVE Final    Comment: (NOTE) SARS-CoV-2 target nucleic acids are NOT DETECTED.  The SARS-CoV-2 RNA is generally detectable in upper respiratory specimens during the acute phase of infection. The lowest concentration of SARS-CoV-2 viral copies this assay can detect is 138 copies/mL. A negative result does not preclude SARS-Cov-2 infection and should not be used as the sole basis for treatment or other patient management decisions. A negative result may occur with  improper specimen collection/handling, submission of specimen other than nasopharyngeal swab, presence of viral mutation(s) within the areas targeted by this assay, and inadequate number of viral copies(<138 copies/mL). A negative result must be combined with clinical observations, patient history, and epidemiological information. The expected result is Negative.  Fact Sheet for Patients:  EntrepreneurPulse.com.au  Fact Sheet for Healthcare Providers:  SeriousBroker.it  This test is no t yet approved or cleared by the [place] FDA and  has been authorized for detection and/or diagnosis of SARS-CoV-2 by FDA under an Emergency Use Authorization (EUA). This EUA will remain  in effect (meaning this test can be used) for the duration of the COVID-19 declaration under Section 564(b)(1) of the Act, 21 U.S.C.section 360bbb-3(b)(1), unless the authorization is terminated  or revoked sooner.       Influenza A by PCR NEGATIVE NEGATIVE Final   Influenza B by PCR NEGATIVE NEGATIVE Final    Comment: (NOTE) The Xpert Xpress SARS-CoV-2/FLU/RSV plus assay is intended as an aid in the diagnosis of influenza from Nasopharyngeal swab specimens and should not be used as a sole basis for treatment. Nasal washings and aspirates are  unacceptable for Xpert Xpress SARS-CoV-2/FLU/RSV testing.  Fact Sheet for Patients: BloggerCourse.com  Fact Sheet for Healthcare Providers: SeriousBroker.it  This test is not yet approved or cleared by the [place] FDA and has been authorized for detection and/or diagnosis of SARS-CoV-2 by FDA under an Emergency Use Authorization (EUA). This EUA will remain in effect (meaning this test can be used) for the duration of the COVID-19 declaration under Section 564(b)(1) of the Act, 21 U.S.C. section 360bbb-3(b)(1), unless the authorization is terminated or revoked.  Performed at [facility], [address] [place], [place] [address]      Studies: No results found.  Total time spent: 25 minutes  Dorcas Carrow, MD  [facility] 07/11/2021  If 7PM-7AM, please contact night-coverage

## 2021-07-12 LAB — BASIC METABOLIC PANEL
Anion gap: 7 (ref 5–15)
BUN: 57 mg/dL — ABNORMAL HIGH (ref 8–23)
CO2: 26 mmol/L (ref 22–32)
Calcium: 7.7 mg/dL — ABNORMAL LOW (ref 8.9–10.3)
Chloride: 96 mmol/L — ABNORMAL LOW (ref 98–111)
Creatinine, Ser: 1.96 mg/dL — ABNORMAL HIGH (ref 0.61–1.24)
GFR, Estimated: 32 mL/min — ABNORMAL LOW (ref >60)
Glucose, Bld: 89 mg/dL (ref 70–99)
Potassium: 4.3 mmol/L (ref 3.5–5.1)
Sodium: 129 mmol/L — ABNORMAL LOW (ref 135–145)

## 2021-07-12 LAB — CBC WITH DIFFERENTIAL/PLATELET
Abs Immature Granulocytes: 0.07 10*3/uL (ref 0.00–0.07)
Basophils Absolute: 0 10*3/uL (ref 0.0–0.1)
Basophils Relative: 0 %
Eosinophils Absolute: 0.2 10*3/uL (ref 0.0–0.5)
Eosinophils Relative: 2 %
HCT: 30.8 % — ABNORMAL LOW (ref 39.0–52.0)
Hemoglobin: 10.7 g/dL — ABNORMAL LOW (ref 13.0–17.0)
Immature Granulocytes: 1 %
Lymphocytes Relative: 10 %
Lymphs Abs: 0.9 10*3/uL (ref 0.7–4.0)
MCH: 31.8 pg (ref 26.0–34.0)
MCHC: 34.7 g/dL (ref 30.0–36.0)
MCV: 91.4 fL (ref 80.0–100.0)
Monocytes Absolute: 1 10*3/uL (ref 0.1–1.0)
Monocytes Relative: 11 %
Neutro Abs: 6.9 10*3/uL (ref 1.7–7.7)
Neutrophils Relative %: 76 %
Platelets: 220 10*3/uL (ref 150–400)
RBC: 3.37 MIL/uL — ABNORMAL LOW (ref 4.22–5.81)
RDW: 14.6 % (ref 11.5–15.5)
WBC: 9 10*3/uL (ref 4.0–10.5)
nRBC: 0 % (ref 0.0–0.2)

## 2021-07-12 LAB — RESP PANEL BY RT-PCR (FLU A&B, COVID) ARPGX2
Influenza A by PCR: NEGATIVE
Influenza B by PCR: NEGATIVE
SARS Coronavirus 2 by RT PCR: NEGATIVE

## 2021-07-12 LAB — MAGNESIUM: Magnesium: 2.4 mg/dL (ref 1.7–2.4)

## 2021-07-12 MED ORDER — FUROSEMIDE 20 MG PO TABS: 20.0000 mg | ORAL_TABLET | Freq: Every day | ORAL | 0 refills | Status: DC

## 2021-07-12 NOTE — TOC Progression Note (Signed)
Transition of Care [facility]) - Progression Note    Patient Details  Name: Caleb Khan MRN: [id number] Date of Birth: 20-Jan-1928  Transition of Care [facility]) CM/SW Contact  Marlowe Sax, RN Phone Number: 07/12/2021, 1:17 PM  Clinical Narrative:  Insurance Berkley Harvey is still pending          Expected Discharge Plan and Services           Expected Discharge Date: 07/12/21                                     Social Determinants of Health (SDOH) Interventions    Readmission Risk Interventions No flowsheet data found.

## 2021-07-12 NOTE — Plan of Care (Signed)
°  Problem: Education: Goal: Knowledge of General Education information will improve Description: Including pain rating scale, medication(s)/side effects and non-pharmacologic comfort measures Outcome: Progressing   Problem: Health Behavior/Discharge Planning: Goal: Ability to manage health-related needs will improve Outcome: Progressing   Problem: Clinical Measurements: Goal: Ability to maintain clinical measurements within normal limits will improve Outcome: Progressing Goal: Will remain free from infection Outcome: Progressing Goal: Diagnostic test results will improve Outcome: Progressing Goal: Respiratory complications will improve Outcome: Progressing   Problem: Pain Managment: Goal: General experience of comfort will improve Outcome: Progressing   Problem: Safety: Goal: Ability to remain free from injury will improve Outcome: Progressing   Problem: Skin Integrity: Goal: Risk for impaired skin integrity will decrease Outcome: Progressing   Problem: Education: Goal: Verbalization of understanding the information provided (i.e., activity precautions, restrictions, etc) will improve Outcome: Progressing Goal: Individualized Educational Video(s) Outcome: Progressing   Problem: Self-Concept: Goal: Ability to maintain and perform role responsibilities to the fullest extent possible will improve Outcome: Progressing   Problem: Pain Management: Goal: Pain level will decrease Outcome: Progressing

## 2021-07-12 NOTE — Progress Notes (Signed)
Subjective:  Patient reports pain as mild.    Objective:   VITALS:   Vitals:   07/11/21 2106 07/12/21 0407 07/12/21 0808 07/12/21 1149  BP: (!) 117/57 (!) 146/64 (!) 145/75 (!) 121/55  Pulse: 67 70 (!) 108 70  Resp: 18 18 18 18   Temp: 98.3 F (36.8 C) 97.7 F (36.5 C) 98.4 F (36.9 C) 98.2 F (36.8 C)  TempSrc:      SpO2: 93% 91% (!) 87% (!) 88%  Weight:      Height:        PHYSICAL EXAM:  Sensation intact distally Dorsiflexion/Plantar flexion intact Incision: dressing C/D/I No cellulitis present Compartment soft  LABS  Results for orders placed or performed during the hospital encounter of 07/07/21 (from the past 24 hour(s))  CBC with Differential/Platelet     Status: Abnormal   Collection Time: 07/12/21  3:58 AM  Result Value Ref Range   WBC 9.0 4.0 - 10.5 K/uL   RBC 3.37 (L) 4.22 - 5.81 MIL/uL   Hemoglobin 10.7 (L) 13.0 - 17.0 g/dL   HCT 30.8 (L) 39.0 - 52.0 %   MCV 91.4 80.0 - 100.0 fL   MCH 31.8 26.0 - 34.0 pg   MCHC 34.7 30.0 - 36.0 g/dL   RDW 14.6 11.5 - 15.5 %   Platelets 220 150 - 400 K/uL   nRBC 0.0 0.0 - 0.2 %   Neutrophils Relative % 76 %   Neutro Abs 6.9 1.7 - 7.7 K/uL   Lymphocytes Relative 10 %   Lymphs Abs 0.9 0.7 - 4.0 K/uL   Monocytes Relative 11 %   Monocytes Absolute 1.0 0.1 - 1.0 K/uL   Eosinophils Relative 2 %   Eosinophils Absolute 0.2 0.0 - 0.5 K/uL   Basophils Relative 0 %   Basophils Absolute 0.0 0.0 - 0.1 K/uL   Immature Granulocytes 1 %   Abs Immature Granulocytes 0.07 0.00 - 0.07 K/uL  Basic metabolic panel     Status: Abnormal   Collection Time: 07/12/21  3:58 AM  Result Value Ref Range   Sodium 129 (L) 135 - 145 mmol/L   Potassium 4.3 3.5 - 5.1 mmol/L   Chloride 96 (L) 98 - 111 mmol/L   CO2 26 22 - 32 mmol/L   Glucose, Bld 89 70 - 99 mg/dL   BUN 57 (H) 8 - 23 mg/dL   Creatinine, Ser 1.96 (H) 0.61 - 1.24 mg/dL   Calcium 7.7 (L) 8.9 - 10.3 mg/dL   GFR, Estimated 32 (L) >60 mL/min   Anion gap 7 5 - 15  Magnesium      Status: None   Collection Time: 07/12/21  3:58 AM  Result Value Ref Range   Magnesium 2.4 1.7 - 2.4 mg/dL  Resp Panel by RT-PCR (Flu A&B, Covid) Nasopharyngeal Swab     Status: None   Collection Time: 07/12/21  9:45 AM   Specimen: Nasopharyngeal Swab; Nasopharyngeal(NP) swabs in vial transport medium  Result Value Ref Range   SARS Coronavirus 2 by RT PCR NEGATIVE NEGATIVE   Influenza A by PCR NEGATIVE NEGATIVE   Influenza B by PCR NEGATIVE NEGATIVE    DG Chest Port 1 View  Result Date: 07/11/2021 CLINICAL DATA:  Hypoxia. EXAM: PORTABLE CHEST 1 VIEW COMPARISON:  01/22/2021 FINDINGS: The cardiac silhouette remains enlarged. Aortic atherosclerosis is noted. There is pulmonary vascular congestion with diffusely accentuated and indistinct interstitial markings. No sizable pleural effusion or pneumothorax is identified. Biapical lung scarring and calcification are unchanged. A  right shoulder arthroplasty is present with chronic periprosthetic lucency, incompletely evaluated. Advanced left glenohumeral osteoarthrosis is noted. IMPRESSION: Cardiomegaly with evidence of pulmonary edema. Electronically Signed   By: Sebastian Ache M.D.   On: 07/11/2021 14:57    Assessment/Plan: 3 Days Post-Op   Principal Problem:   Femoral neck fracture (HCC) Active Problems:   CKD (chronic kidney disease), stage IIIb   COPD (chronic obstructive pulmonary disease) (HCC)   Coronary artery disease   Hypertension   Polymyalgia rheumatica (HCC)   Spinal stenosis   Fall   Discharge to SNF  Advance diet Up with therapy Discharge per hospitalist  Continue Eliquis (home dose) Hydrocodone for pain  (rx in chart) WBAT RLE Follow up in office in 2 weeks for staple removal 07/22/21 Call office to confirm appointment [phone]   Lyndle Herrlich , MD 07/12/2021, 12:35 PM

## 2021-07-12 NOTE — TOC Progression Note (Signed)
Transition of Care [facility]) - Progression Note    Patient Details  Name: Caleb Khan MRN: [id number] Date of Birth: [birth year]  Transition of Care [facility]) CM/SW Contact  Marlowe Sax, RN Phone Number: 07/12/2021, 8:26 AM  Clinical Narrative:   Submitted clinical documents to obtain Insurance approval for the patient to go to [place] today, Ins is pending         Expected Discharge Plan and Services                                                 Social Determinants of Health (SDOH) Interventions    Readmission Risk Interventions No flowsheet data found.

## 2021-07-12 NOTE — TOC Progression Note (Signed)
Transition of Care [facility]) - Progression Note    Patient Details  Name: Caleb Khan MRN: [id number] Date of Birth: 1928-06-22  Transition of Care [facility]) CM/SW Contact  Marlowe Sax, RN Phone Number: 07/12/2021, 2:41 PM  Clinical Narrative:     The patient will go home with Home health PT with [facility], He needs Home oxygen to go home I spoke with [facility] with [employer], it will be delivered to his room prior to DC,        Expected Discharge Plan and Services           Expected Discharge Date: 07/12/21                                     Social Determinants of Health (SDOH) Interventions    Readmission Risk Interventions No flowsheet data found.

## 2021-07-12 NOTE — TOC Progression Note (Signed)
Transition of Care [facility]) - Progression Note    Patient Details  Name: Caleb Khan MRN: [id number] Date of Birth: [birth year]  Transition of Care [facility]) CM/SW Contact  Marlowe Sax, RN Phone Number: 07/12/2021, 2:12 PM  Clinical Narrative:    Insurance auth still pending        Expected Discharge Plan and Services           Expected Discharge Date: 07/12/21                                     Social Determinants of Health (SDOH) Interventions    Readmission Risk Interventions No flowsheet data found.

## 2021-07-12 NOTE — Progress Notes (Signed)
Physical Therapy Treatment Patient Details Name: Caleb Khan MRN: [id number] DOB: 06/09/1928 Today's Date: 07/12/2021   History of Present Illness Pt is a 87 y.o. male with past medical history of spinal stenosis, stage III CKD, coronary artery disease, atrial fibrillation on anticoagulation presented hospital after sustaining a fall at a store with difficulty bearing weight on the right lower extremity. MD assessment includes: femoral neck fractuce s/p R hip hemiarthroplasty 12/14.    PT Comments    Pt was long sitting in bed with supportive spouse at bedside. He agrees to PT session and is cooperative and motivated throughout. He was able to exit L side of bed, stand to RW and ambulate short distances with CGA. Attempted weaning pt of during session however unsuccessful. He desaturates to 83% on rm air but quickly recovers to 90s with 2 L o2 applied. PT recommends DC to SNF to progress pt to PLOF.    Recommendations for follow up therapy are one component of a multi-disciplinary discharge planning process, led by the attending physician.  Recommendations may be updated based on patient status, additional functional criteria and insurance authorization.  Follow Up Recommendations  Skilled nursing-short term rehab (<3 hours/day)     Assistance Recommended at Discharge PRN  Equipment Recommendations  Other (comment) (defer to next level of care)       Precautions / Restrictions Precautions Precautions: Anterior Hip;Fall Precaution Booklet Issued: Yes (comment) Restrictions Weight Bearing Restrictions: Yes RLE Weight Bearing: Weight bearing as tolerated     Mobility  Bed Mobility Overal bed mobility: Modified Independent     Transfers Overall transfer level: Modified independent Equipment used: Rolling walker (2 wheels) Transfers: Sit to/from Stand Sit to Stand: Supervision;Min guard     Ambulation/Gait Ambulation/Gait assistance: Min guard Gait Distance (Feet): 50  Feet Assistive device: Rolling walker (2 wheels) Gait Pattern/deviations: Step-through pattern;Decreased step length - right;Decreased step length - left;Decreased stride length Gait velocity: decreased     General Gait Details: Pt attempted ambulation without O2 however pt desaturates quickly to low 80s with short ambulation distances.     Balance Overall balance assessment: Needs assistance Sitting-balance support: Bilateral upper extremity supported;Feet supported Sitting balance-Leahy Scale: Good     Standing balance support: Bilateral upper extremity supported;During functional activity Standing balance-Leahy Scale: Fair       Cognition Arousal/Alertness: Awake/alert Behavior During Therapy: WFL for tasks assessed/performed Overall Cognitive Status: Within Functional Limits for tasks assessed      General Comments: Pt is A and O x 4           General Comments General comments (skin integrity, edema, etc.): Reviewed HEP and pt performed. no difficulty or assistance required.      Pertinent Vitals/Pain Pain Assessment: 0-10 Pain Score: 4  Pain Descriptors / Indicators: Aching;Burning;Constant;Contraction Pain Intervention(s): Limited activity within patient's tolerance;Monitored during session;Premedicated before session;Repositioned;Ice applied     PT Goals (current goals can now be found in the care plan section) Acute Rehab PT Goals Patient Stated Goal: to get back to normal Progress towards PT goals: Progressing toward goals    Frequency    BID      PT Plan Discharge plan needs to be updated       AM-PAC PT "6 Clicks" Mobility   Outcome Measure  Help needed turning from your back to your side while in a flat bed without using bedrails?: A Little Help needed moving from lying on your back to sitting on the side of a flat bed without  using bedrails?: A Little Help needed moving to and from a bed to a chair (including a wheelchair)?: A Little Help  needed standing up from a chair using your arms (e.g., wheelchair or bedside chair)?: A Little Help needed to walk in hospital room?: A Little Help needed climbing 3-5 steps with a railing? : A Little 6 Click Score: 18    End of Session Equipment Utilized During Treatment: Gait belt;Oxygen Activity Tolerance: Patient limited by fatigue Patient left: in chair;with call bell/phone within reach;with chair alarm set;with SCD's reapplied Nurse Communication: Mobility status PT Visit Diagnosis: Other abnormalities of gait and mobility (R26.89);History of falling (Z91.81);Muscle weakness (generalized) (M62.81);Pain Pain - Right/Left: Right Pain - part of body: Hip     Time: [id number] PT Time Calculation (min) (ACUTE ONLY): 26 min  Charges:  $Gait Training: 8-22 mins $Therapeutic Exercise: 8-22 mins                     Julaine Fusi PTA 07/12/21, 10:17 AM

## 2021-07-12 NOTE — Discharge Summary (Addendum)
Physician Discharge Summary  Caleb Khan [id number] DOB: 06/27/1928 DOA: 07/07/2021  PCP: Dion Body, MD  Admit date: 07/07/2021 Discharge date: 07/12/2021  Admitted From: Independent living Disposition: Skilled nursing facility  Recommendations for Outpatient Follow-up:  Follow up with PCP in 1-2 weeks Please obtain BMP/CBC in one week Orthopedics will schedule follow-up  Home Health: N/A Equipment/Devices: Oxygen  Discharge Condition: Stable CODE STATUS: DNR Diet recommendation: Low-salt diet  Discharge summary: Caleb Khan is a 87 y.o. male with past medical history of spinal stenosis, stage III CKD, coronary artery disease, atrial fibrillation on anticoagulation presented hospital after sustaining a fall at a store with difficulty bearing weight on the right lower extremity.  EMS was called in and patient was brought into the hospital.  Initial white count was elevated at 15,000.  Right hip showed acute mildly impacted right femoral neck fracture.  Right elbow x-ray showed no acute osseous abnormality.  Patient was then admitted hospital for further evaluation and treatment    Assessment/Plan of care:   Close traumatic right femoral neck fracture:  Status post right hip hemiarthroplasty 12/14 Weightbearing as tolerated Adequate pain medications, mostly controlled with oral pain medications. Eliquis for DVT prophylaxis PT OT following.  Going to SNF for rehab. Dry dressing.  No special wound care needed.  Orthopedics will schedule follow-up.   Acute hypoxemic respiratory failure: Possible fluid overload.  Known diastolic dysfunction.  Normal ejection fraction.   Patient developed fluid overload after treatment with IV fluids and perioperative treatment.  1 dose of Lasix IV given with improvement.  Symptoms improved.  Still on 2 L oxygen. Keep on 2 to 4 L of oxygen to keep saturation more than 90%.  Can gradually wean off. Patient is not on diuretics before.  To  avoid polypharmacy, will prescribe very low-dose of Lasix 20 mg daily for next week.  Recheck CBC and BMP in 1 week.   Chronic atrial fibrillation: Continue amiodarone.  Rate controlled in sinus rhythm today.  Eliquis resumed.   Leukocytosis: Mostly reactive.  On chronic steroids.  Stable.   Coronary artery disease: Is stable.  On Eliquis.  Continued.  Not on aspirin.   Essential hypertension: Stable on amlodipine and losartan.  Blood pressures are adequate.  Will discontinue losartan until renal functions are stabilized.  Reassess in 1 to 2 weeks.   Polymyalgia rheumatica and a spinal stenosis: On chronic prednisone therapy continued.     Acute kidney injury with history of chronic kidney disease stage IIIb: Baseline creatinine about 1.3-1.4.  Evidence of some perioperative hypotension.  His renal functions may take some time to stabilize.  Recheck in 1 week.  Patient is medically stable to transfer to skilled level of care.     Discharge Diagnoses:  Principal Problem:   Femoral neck fracture (HCC) Active Problems:   CKD (chronic kidney disease), stage IIIb   COPD (chronic obstructive pulmonary disease) (HCC)   Coronary artery disease   Hypertension   Polymyalgia rheumatica (HCC)   Spinal stenosis   Fall    Discharge Instructions  Discharge Instructions     Call MD for:  difficulty breathing, headache or visual disturbances   Complete by: As directed    Call MD for:  redness, tenderness, or signs of infection (pain, swelling, redness, odor or green/yellow discharge around incision site)   Complete by: As directed    Call MD for:  severe uncontrolled pain   Complete by: As directed    Diet - low sodium heart healthy  Complete by: As directed    Discharge instructions   Complete by: As directed    Keep on oxygen to keep saturation more than 90%. Recheck CBC and BMP in 1 week.   Increase activity slowly   Complete by: As directed    No dressing needed   Complete by:  As directed    Keep it clean and dry.  Can shower.      Allergies as of 07/12/2021       Reactions   Benazepril Cough        Medication List     STOP taking these medications    aspirin EC 81 MG tablet   isosorbide mononitrate 30 MG 24 hr tablet Commonly known as: IMDUR   losartan 50 MG tablet Commonly known as: COZAAR   SYSTANE ICAPS AREDS2 PO       TAKE these medications    acetaminophen 500 MG tablet Commonly known as: TYLENOL Take 1,000 mg by mouth every 8 (eight) hours as needed (pain).   amiodarone 200 MG tablet Commonly known as: PACERONE Take 1 tablet (200 mg total) by mouth 2 (two) times daily. What changed:  how much to take when to take this   amLODipine 10 MG tablet Commonly known as: NORVASC Take 10 mg by mouth daily.   apixaban 5 MG Tabs tablet Commonly known as: ELIQUIS Take 1 tablet (5 mg total) by mouth 2 (two) times daily. What changed: how much to take   Calcium Carb-Cholecalciferol 500-400 MG-UNIT Tabs Take 1 tablet by mouth daily.   docusate sodium 100 MG capsule Commonly known as: COLACE Take 100 mg by mouth every evening.   furosemide 20 MG tablet Commonly known as: Lasix Take 1 tablet (20 mg total) by mouth daily for 7 days.   gabapentin 100 MG capsule Commonly known as: NEURONTIN Take 100 mg by mouth daily.   HYDROcodone-acetaminophen 5-325 MG tablet Commonly known as: NORCO/VICODIN Take 1 tablet by mouth every 6 (six) hours as needed for moderate pain.   METAMUCIL FIBER PO Take by mouth.   MULTIVITAMIN PO Take 1 tablet by mouth daily.   nitroGLYCERIN 0.4 MG SL tablet Commonly known as: NITROSTAT Place 0.4 mg under the tongue every 5 (five) minutes as needed for chest pain.   predniSONE 10 MG tablet Commonly known as: DELTASONE Take 5 mg by mouth daily with breakfast.   rosuvastatin 5 MG tablet Commonly known as: CRESTOR Take 5 mg by mouth daily.   zolpidem 5 MG tablet Commonly known as: AMBIEN Take  5 mg by mouth at bedtime as needed for sleep.               Durable Medical Equipment  (From admission, onward)           Start     Ordered   07/12/21 1453  For home use only DME oxygen  Once       Comments: Patient Saturations on Room Air at Rest = 88%   Patient Saturations on Room Air while Ambulating = 86%   Patient Saturations on 3 Liters of oxygen while Ambulating = 96%  Question Answer Comment  Length of Need Lifetime   Mode or (Route) Nasal cannula   Liters per Minute 3   Frequency Continuous (stationary and portable oxygen unit needed)   Oxygen conserving device Yes   Oxygen delivery system Gas      07/12/21 1452   07/10/21 1423  For home use only DME Walker rolling  Once       Question Answer Comment  Walker: With [place]   Patient needs a walker to treat with the following condition Weakness      07/10/21 1423              Discharge Care Instructions  (From admission, onward)           Start     Ordered   07/12/21 0000  No dressing needed       Comments: Keep it clean and dry.  Can shower.   07/12/21 0910            Contact information for after-discharge care     Destination     [facility] .   Service: Skilled Nursing Contact information: [place] [address] [phone]                    Allergies  Allergen Reactions   Benazepril Cough    Consultations: Orthopedics   Procedures/Studies: DG Elbow Complete Right  Result Date: 07/07/2021 CLINICAL DATA:  Right elbow pain after a fall. EXAM: RIGHT ELBOW - COMPLETE 3+ VIEW COMPARISON:  None. FINDINGS: No acute fracture or dislocation. No joint effusion. Mild degenerative changes with joint space narrowing and marginal osteophytes. Soft tissues are unremarkable. IMPRESSION: 1. No acute osseous abnormality. 2. Mild osteoarthritis. Electronically Signed   By: Titus Dubin M.D.   On: 07/07/2021 13:53   DG  Chest Port 1 View  Result Date: 07/11/2021 CLINICAL DATA:  Hypoxia. EXAM: PORTABLE CHEST 1 VIEW COMPARISON:  01/22/2021 FINDINGS: The cardiac silhouette remains enlarged. Aortic atherosclerosis is noted. There is pulmonary vascular congestion with diffusely accentuated and indistinct interstitial markings. No sizable pleural effusion or pneumothorax is identified. Biapical lung scarring and calcification are unchanged. A right shoulder arthroplasty is present with chronic periprosthetic lucency, incompletely evaluated. Advanced left glenohumeral osteoarthrosis is noted. IMPRESSION: Cardiomegaly with evidence of pulmonary edema. Electronically Signed   By: Logan Bores M.D.   On: 07/11/2021 14:57   DG C-Arm 1-60 Min-No Report  Result Date: 07/09/2021 CLINICAL DATA:  Status post right total hip replacement. EXAM: DG HIP (WITH OR WITHOUT PELVIS) 1V RIGHT; DG C-ARM 1-60 MIN-NO REPORT Radiation exposure index: 0.8181 mGy. COMPARISON:  July 07, 2021. FINDINGS: Two intraoperative fluoroscopic images were obtained of the right hip. These demonstrate the right acetabular and femoral components to be in grossly good position. IMPRESSION: Fluoroscopic guidance provided during right total hip arthroplasty. Electronically Signed   By: Marijo Conception M.D.   On: 07/09/2021 13:48   DG HIP UNILAT WITH PELVIS 1V RIGHT  Result Date: 07/09/2021 CLINICAL DATA:  Status post right total hip replacement. EXAM: DG HIP (WITH OR WITHOUT PELVIS) 1V RIGHT; DG C-ARM 1-60 MIN-NO REPORT Radiation exposure index: 0.8181 mGy. COMPARISON:  July 07, 2021. FINDINGS: Two intraoperative fluoroscopic images were obtained of the right hip. These demonstrate the right acetabular and femoral components to be in grossly good position. IMPRESSION: Fluoroscopic guidance provided during right total hip arthroplasty. Electronically Signed   By: Marijo Conception M.D.   On: 07/09/2021 13:48   DG Hip Unilat W or Wo Pelvis 2-3 Views  Right  Result Date: 07/07/2021 CLINICAL DATA:  Right hip pain after fall. EXAM: DG HIP (WITH OR WITHOUT PELVIS) 2-3V RIGHT COMPARISON:  CT abdomen pelvis dated April 03, 2020. FINDINGS: Acute mildly impacted right femoral neck fracture. No dislocation. The pubic symphysis and sacroiliac joints  are intact. IMPRESSION: 1. Acute mildly impacted right femoral neck fracture. Electronically Signed   By: Titus Dubin M.D.   On: 07/07/2021 13:55   (Echo, Carotid, EGD, Colonoscopy, ERCP)    Subjective: Patient seen and examined.  No overnight events.  Shortness of breath has improved.  No pain or discomfort at rest.   Discharge Exam: Vitals:   07/12/21 0808 07/12/21 1149  BP: (!) 145/75 (!) 121/55  Pulse: (!) 108 70  Resp: 18 18  Temp: 98.4 F (36.9 C) 98.2 F (36.8 C)  SpO2: (!) 87% (!) 88%   Vitals:   07/11/21 2106 07/12/21 0407 07/12/21 0808 07/12/21 1149  BP: (!) 117/57 (!) 146/64 (!) 145/75 (!) 121/55  Pulse: 67 70 (!) 108 70  Resp: 18 18 18 18   Temp: 98.3 F (36.8 C) 97.7 F (36.5 C) 98.4 F (36.9 C) 98.2 F (36.8 C)  TempSrc:      SpO2: 93% 91% (!) 87% (!) 88%  Weight:      Height:        General: Pt is alert, awake, not in acute distress On 1 to 2 L of oxygen.  Looks very comfortable. Cardiovascular: RRR, S1/S2 +, no rubs, no gallops Respiratory: CTA bilaterally, no wheezing, no rhonchi, no added sound. Abdominal: Soft, NT, ND, bowel sounds + Extremities: no edema, no cyanosis Right anterior thigh incision looks clean and dry.  Nontender.    The results of significant diagnostics from this hospitalization (including imaging, microbiology, ancillary and laboratory) are listed below for reference.     Microbiology: Recent Results (from the past 240 hour(s))  Resp Panel by RT-PCR (Flu A&B, Covid) Nasopharyngeal Swab     Status: None   Collection Time: 07/07/21  2:25 PM   Specimen: Nasopharyngeal Swab; Nasopharyngeal(NP) swabs in vial transport medium  Result  Value Ref Range Status   SARS Coronavirus 2 by RT PCR NEGATIVE NEGATIVE Final    Comment: (NOTE) SARS-CoV-2 target nucleic acids are NOT DETECTED.  The SARS-CoV-2 RNA is generally detectable in upper respiratory specimens during the acute phase of infection. The lowest concentration of SARS-CoV-2 viral copies this assay can detect is 138 copies/mL. A negative result does not preclude SARS-Cov-2 infection and should not be used as the sole basis for treatment or other patient management decisions. A negative result may occur with  improper specimen collection/handling, submission of specimen other than nasopharyngeal swab, presence of viral mutation(s) within the areas targeted by this assay, and inadequate number of viral copies(<138 copies/mL). A negative result must be combined with clinical observations, patient history, and epidemiological information. The expected result is Negative.  Fact Sheet for Patients:  EntrepreneurPulse.com.au  Fact Sheet for Healthcare Providers:  IncredibleEmployment.be  This test is no t yet approved or cleared by the [place] FDA and  has been authorized for detection and/or diagnosis of SARS-CoV-2 by FDA under an Emergency Use Authorization (EUA). This EUA will remain  in effect (meaning this test can be used) for the duration of the COVID-19 declaration under Section 564(b)(1) of the Act, 21 U.S.C.section 360bbb-3(b)(1), unless the authorization is terminated  or revoked sooner.       Influenza A by PCR NEGATIVE NEGATIVE Final   Influenza B by PCR NEGATIVE NEGATIVE Final    Comment: (NOTE) The Xpert Xpress SARS-CoV-2/FLU/RSV plus assay is intended as an aid in the diagnosis of influenza from Nasopharyngeal swab specimens and should not be used as a sole basis for treatment. Nasal washings and aspirates are  unacceptable for Xpert Xpress SARS-CoV-2/FLU/RSV testing.  Fact Sheet for  Patients: BloggerCourse.com  Fact Sheet for Healthcare Providers: SeriousBroker.it  This test is not yet approved or cleared by the [place] FDA and has been authorized for detection and/or diagnosis of SARS-CoV-2 by FDA under an Emergency Use Authorization (EUA). This EUA will remain in effect (meaning this test can be used) for the duration of the COVID-19 declaration under Section 564(b)(1) of the Act, 21 U.S.C. section 360bbb-3(b)(1), unless the authorization is terminated or revoked.  Performed at [facility], [address] [place], [place] [address]   Resp Panel by RT-PCR (Flu A&B, Covid) Nasopharyngeal Swab     Status: None   Collection Time: 07/12/21  9:45 AM   Specimen: Nasopharyngeal Swab; Nasopharyngeal(NP) swabs in vial transport medium  Result Value Ref Range Status   SARS Coronavirus 2 by RT PCR NEGATIVE NEGATIVE Final    Comment: (NOTE) SARS-CoV-2 target nucleic acids are NOT DETECTED.  The SARS-CoV-2 RNA is generally detectable in upper respiratory specimens during the acute phase of infection. The lowest concentration of SARS-CoV-2 viral copies this assay can detect is 138 copies/mL. A negative result does not preclude SARS-Cov-2 infection and should not be used as the sole basis for treatment or other patient management decisions. A negative result may occur with  improper specimen collection/handling, submission of specimen other than nasopharyngeal swab, presence of viral mutation(s) within the areas targeted by this assay, and inadequate number of viral copies(<138 copies/mL). A negative result must be combined with clinical observations, patient history, and epidemiological information. The expected result is Negative.  Fact Sheet for Patients:  BloggerCourse.com  Fact Sheet for Healthcare Providers:  SeriousBroker.it  This test is no t  yet approved or cleared by the [place] FDA and  has been authorized for detection and/or diagnosis of SARS-CoV-2 by FDA under an Emergency Use Authorization (EUA). This EUA will remain  in effect (meaning this test can be used) for the duration of the COVID-19 declaration under Section 564(b)(1) of the Act, 21 U.S.C.section 360bbb-3(b)(1), unless the authorization is terminated  or revoked sooner.       Influenza A by PCR NEGATIVE NEGATIVE Final   Influenza B by PCR NEGATIVE NEGATIVE Final    Comment: (NOTE) The Xpert Xpress SARS-CoV-2/FLU/RSV plus assay is intended as an aid in the diagnosis of influenza from Nasopharyngeal swab specimens and should not be used as a sole basis for treatment. Nasal washings and aspirates are unacceptable for Xpert Xpress SARS-CoV-2/FLU/RSV testing.  Fact Sheet for Patients: BloggerCourse.com  Fact Sheet for Healthcare Providers: SeriousBroker.it  This test is not yet approved or cleared by the [place] FDA and has been authorized for detection and/or diagnosis of SARS-CoV-2 by FDA under an Emergency Use Authorization (EUA). This EUA will remain in effect (meaning this test can be used) for the duration of the COVID-19 declaration under Section 564(b)(1) of the Act, 21 U.S.C. section 360bbb-3(b)(1), unless the authorization is terminated or revoked.  Performed at [facility], [address] [place], [place] [address]      Labs: BNP (last 3 results) Recent Labs    01/22/21 0632  BNP 423.2*   Basic Metabolic Panel: Recent Labs  Lab 07/08/21 0257 07/09/21 0138 07/10/21 0415 07/11/21 0542 07/12/21 0358  NA 133* 132* 130* 127* 129*  K 4.6 4.2 4.6 4.8 4.3  CL 102 99 95* 96* 96*  CO2 24 26 24 25 26   GLUCOSE 105* 97 131* 95 89  BUN 37* 33* 45* 54* 57*  CREATININE 1.38* 1.47* 1.99* 1.78* 1.96*  CALCIUM 8.4* 8.4* 8.0* 8.1* 7.7*  MG  --  2.1  --   --  2.4    Liver Function Tests: No results for input(s): AST, ALT, ALKPHOS, BILITOT, PROT, ALBUMIN in the last 168 hours. No results for input(s): LIPASE, AMYLASE in the last 168 hours. No results for input(s): AMMONIA in the last 168 hours. CBC: Recent Labs  Lab 07/07/21 1417 07/08/21 0257 07/09/21 0138 07/10/21 0415 07/11/21 0542 07/12/21 0358  WBC 15.1* 10.9* 10.3 13.5* 10.4 9.0  NEUTROABS 13.2*  --   --   --   --  6.9  HGB 13.6 11.5* 12.6* 11.3* 10.7* 10.7*  HCT 40.3 34.0* 36.1* 32.6* 30.8* 30.8*  MCV 94.8 92.9 92.3 92.9 92.5 91.4  PLT 260 227 205 197 203 220   Cardiac Enzymes: No results for input(s): CKTOTAL, CKMB, CKMBINDEX, TROPONINI in the last 168 hours. BNP: Invalid input(s): POCBNP CBG: No results for input(s): GLUCAP in the last 168 hours. D-Dimer No results for input(s): DDIMER in the last 72 hours. Hgb A1c No results for input(s): HGBA1C in the last 72 hours. Lipid Profile No results for input(s): CHOL, HDL, LDLCALC, TRIG, CHOLHDL, LDLDIRECT in the last 72 hours. Thyroid function studies No results for input(s): TSH, T4TOTAL, T3FREE, THYROIDAB in the last 72 hours.  Invalid input(s): FREET3 Anemia work up No results for input(s): VITAMINB12, FOLATE, FERRITIN, TIBC, IRON, RETICCTPCT in the last 72 hours. Urinalysis    Component Value Date/Time   COLORURINE YELLOW (A) 03/17/2018 0424   APPEARANCEUR HAZY (A) 03/17/2018 0424   LABSPEC 1.006 03/17/2018 0424   PHURINE 7.0 03/17/2018 0424   GLUCOSEU NEGATIVE 03/17/2018 0424   HGBUR LARGE (A) 03/17/2018 0424   BILIRUBINUR NEGATIVE 03/17/2018 0424   KETONESUR NEGATIVE 03/17/2018 0424   PROTEINUR 100 (A) 03/17/2018 0424   NITRITE POSITIVE (A) 03/17/2018 0424   LEUKOCYTESUR MODERATE (A) 03/17/2018 0424   Sepsis Labs Invalid input(s): PROCALCITONIN,  WBC,  LACTICIDVEN Microbiology Recent Results (from the past 240 hour(s))  Resp Panel by RT-PCR (Flu A&B, Covid) Nasopharyngeal Swab     Status: None   Collection  Time: 07/07/21  2:25 PM   Specimen: Nasopharyngeal Swab; Nasopharyngeal(NP) swabs in vial transport medium  Result Value Ref Range Status   SARS Coronavirus 2 by RT PCR NEGATIVE NEGATIVE Final    Comment: (NOTE) SARS-CoV-2 target nucleic acids are NOT DETECTED.  The SARS-CoV-2 RNA is generally detectable in upper respiratory specimens during the acute phase of infection. The lowest concentration of SARS-CoV-2 viral copies this assay can detect is 138 copies/mL. A negative result does not preclude SARS-Cov-2 infection and should not be used as the sole basis for treatment or other patient management decisions. A negative result may occur with  improper specimen collection/handling, submission of specimen other than nasopharyngeal swab, presence of viral mutation(s) within the areas targeted by this assay, and inadequate number of viral copies(<138 copies/mL). A negative result must be combined with clinical observations, patient history, and epidemiological information. The expected result is Negative.  Fact Sheet for Patients:  BloggerCourse.comhttps://www.fda.gov/media/152166/download  Fact Sheet for Healthcare Providers:  SeriousBroker.ithttps://www.fda.gov/media/152162/download  This test is no t yet approved or cleared by the [place] FDA and  has been authorized for detection and/or diagnosis of SARS-CoV-2 by FDA under an Emergency Use Authorization (EUA). This EUA will remain  in effect (meaning this test can be used) for the duration of the COVID-19 declaration under  Section 564(b)(1) of the Act, 21 U.S.C.section 360bbb-3(b)(1), unless the authorization is terminated  or revoked sooner.       Influenza A by PCR NEGATIVE NEGATIVE Final   Influenza B by PCR NEGATIVE NEGATIVE Final    Comment: (NOTE) The Xpert Xpress SARS-CoV-2/FLU/RSV plus assay is intended as an aid in the diagnosis of influenza from Nasopharyngeal swab specimens and should not be used as a sole basis for treatment. Nasal washings  and aspirates are unacceptable for Xpert Xpress SARS-CoV-2/FLU/RSV testing.  Fact Sheet for Patients: EntrepreneurPulse.com.au  Fact Sheet for Healthcare Providers: IncredibleEmployment.be  This test is not yet approved or cleared by the [place] FDA and has been authorized for detection and/or diagnosis of SARS-CoV-2 by FDA under an Emergency Use Authorization (EUA). This EUA will remain in effect (meaning this test can be used) for the duration of the COVID-19 declaration under Section 564(b)(1) of the Act, 21 U.S.C. section 360bbb-3(b)(1), unless the authorization is terminated or revoked.  Performed at [facility], [address] [place], [place] [address]   Resp Panel by RT-PCR (Flu A&B, Covid) Nasopharyngeal Swab     Status: None   Collection Time: 07/12/21  9:45 AM   Specimen: Nasopharyngeal Swab; Nasopharyngeal(NP) swabs in vial transport medium  Result Value Ref Range Status   SARS Coronavirus 2 by RT PCR NEGATIVE NEGATIVE Final    Comment: (NOTE) SARS-CoV-2 target nucleic acids are NOT DETECTED.  The SARS-CoV-2 RNA is generally detectable in upper respiratory specimens during the acute phase of infection. The lowest concentration of SARS-CoV-2 viral copies this assay can detect is 138 copies/mL. A negative result does not preclude SARS-Cov-2 infection and should not be used as the sole basis for treatment or other patient management decisions. A negative result may occur with  improper specimen collection/handling, submission of specimen other than nasopharyngeal swab, presence of viral mutation(s) within the areas targeted by this assay, and inadequate number of viral copies(<138 copies/mL). A negative result must be combined with clinical observations, patient history, and epidemiological information. The expected result is Negative.  Fact Sheet for Patients:  EntrepreneurPulse.com.au  Fact  Sheet for Healthcare Providers:  IncredibleEmployment.be  This test is no t yet approved or cleared by the [place] FDA and  has been authorized for detection and/or diagnosis of SARS-CoV-2 by FDA under an Emergency Use Authorization (EUA). This EUA will remain  in effect (meaning this test can be used) for the duration of the COVID-19 declaration under Section 564(b)(1) of the Act, 21 U.S.C.section 360bbb-3(b)(1), unless the authorization is terminated  or revoked sooner.       Influenza A by PCR NEGATIVE NEGATIVE Final   Influenza B by PCR NEGATIVE NEGATIVE Final    Comment: (NOTE) The Xpert Xpress SARS-CoV-2/FLU/RSV plus assay is intended as an aid in the diagnosis of influenza from Nasopharyngeal swab specimens and should not be used as a sole basis for treatment. Nasal washings and aspirates are unacceptable for Xpert Xpress SARS-CoV-2/FLU/RSV testing.  Fact Sheet for Patients: EntrepreneurPulse.com.au  Fact Sheet for Healthcare Providers: IncredibleEmployment.be  This test is not yet approved or cleared by the [place] FDA and has been authorized for detection and/or diagnosis of SARS-CoV-2 by FDA under an Emergency Use Authorization (EUA). This EUA will remain in effect (meaning this test can be used) for the duration of the COVID-19 declaration under Section 564(b)(1) of the Act, 21 U.S.C. section 360bbb-3(b)(1), unless the authorization is terminated or revoked.  Performed at [facility], [phone]  [address] [place], [place] [address]      Time coordinating discharge:  40 minutes  SIGNED:   Barb Merino, MD  [facility] 07/12/2021, 2:52 PM   Addendum 2:52 PM: Patient was reexamined because he wanted to go home instead of going to SNF.  Patient demonstrated safely walking around in the nursing station without much support.  Good safety awareness.  He wants to go home.  Wife also  wants him to go home instead of going to SNF.  Plan: Discharge recommendations changed from SNF to home with home health PT OT.  Patient did need 3 L of oxygen with underlying history of COPD and CHF.  Will discharge with supplemental oxygen.

## 2021-07-12 NOTE — Progress Notes (Addendum)
SATURATION QUALIFICATIONS: (This note is used to comply with regulatory documentation for home oxygen)  Patient Saturations on Room Air at Rest = 88%  Patient Saturations on Room Air while Ambulating = 86%  Patient Saturations on 3 Liters of oxygen while Ambulating = 96%  Please briefly explain why patient needs home oxygen:

## 2021-07-12 NOTE — TOC Progression Note (Signed)
Transition of Care [facility]) - Progression Note    Patient Details  Name: Caleb Khan MRN: [id number] Date of Birth: 14-Jan-1928  [facility] of [facility]) CM/SW Contact  Marlowe Sax, RN Phone Number: 07/12/2021, 11:28 AM  Clinical Narrative:      Insurance auth pending      Expected Discharge Plan and Services           Expected Discharge Date: 07/12/21                                     Social Determinants of Health (SDOH) Interventions    Readmission Risk Interventions No flowsheet data found.

## 2022-06-25 ENCOUNTER — Other Ambulatory Visit: Payer: Self-pay | Admitting: Nephrology

## 2022-06-25 DIAGNOSIS — N1832 Chronic kidney disease, stage 3b: Secondary | ICD-10-CM

## 2022-06-29 ENCOUNTER — Ambulatory Visit
Admission: RE | Admit: 2022-06-29 | Discharge: 2022-06-29 | Disposition: A | Discharge status: Home / Self Care | Payer: Medicare PPO | Source: Ambulatory Visit | Attending: Nephrology | Admitting: Nephrology

## 2022-06-29 DIAGNOSIS — N1832 Chronic kidney disease, stage 3b: Secondary | ICD-10-CM | POA: Diagnosis present

## 2022-08-13 ENCOUNTER — Other Ambulatory Visit: Payer: Self-pay | Admitting: Orthopedic Surgery

## 2022-08-13 DIAGNOSIS — M4807 Spinal stenosis, lumbosacral region: Secondary | ICD-10-CM

## 2022-08-21 ENCOUNTER — Other Ambulatory Visit: Payer: Self-pay

## 2022-08-21 ENCOUNTER — Inpatient Hospital Stay
Admission: EM | Admit: 2022-08-21 | Discharge: 2022-08-24 | DRG: 291 | Disposition: A | Discharge status: Home Health | Payer: Medicare PPO | Attending: Internal Medicine | Admitting: Internal Medicine

## 2022-08-21 ENCOUNTER — Inpatient Hospital Stay
Admit: 2022-08-21 | Discharge: 2022-08-21 | Disposition: A | Discharge status: Home / Self Care | Payer: Medicare PPO | Attending: Internal Medicine | Admitting: Internal Medicine

## 2022-08-21 ENCOUNTER — Emergency Department: Payer: Medicare PPO

## 2022-08-21 DIAGNOSIS — M549 Dorsalgia, unspecified: Secondary | ICD-10-CM | POA: Diagnosis present

## 2022-08-21 DIAGNOSIS — Z96612 Presence of left artificial shoulder joint: Secondary | ICD-10-CM | POA: Diagnosis present

## 2022-08-21 DIAGNOSIS — R338 Other retention of urine: Secondary | ICD-10-CM | POA: Diagnosis present

## 2022-08-21 DIAGNOSIS — I2510 Atherosclerotic heart disease of native coronary artery without angina pectoris: Secondary | ICD-10-CM | POA: Diagnosis present

## 2022-08-21 DIAGNOSIS — Z7901 Long term (current) use of anticoagulants: Secondary | ICD-10-CM | POA: Diagnosis not present

## 2022-08-21 DIAGNOSIS — R339 Retention of urine, unspecified: Secondary | ICD-10-CM | POA: Insufficient documentation

## 2022-08-21 DIAGNOSIS — Z87891 Personal history of nicotine dependence: Secondary | ICD-10-CM

## 2022-08-21 DIAGNOSIS — I083 Combined rheumatic disorders of mitral, aortic and tricuspid valves: Secondary | ICD-10-CM | POA: Diagnosis present

## 2022-08-21 DIAGNOSIS — J439 Emphysema, unspecified: Secondary | ICD-10-CM | POA: Diagnosis present

## 2022-08-21 DIAGNOSIS — N1832 Chronic kidney disease, stage 3b: Secondary | ICD-10-CM | POA: Diagnosis present

## 2022-08-21 DIAGNOSIS — Z8249 Family history of ischemic heart disease and other diseases of the circulatory system: Secondary | ICD-10-CM

## 2022-08-21 DIAGNOSIS — I10 Essential (primary) hypertension: Secondary | ICD-10-CM | POA: Diagnosis not present

## 2022-08-21 DIAGNOSIS — Z96611 Presence of right artificial shoulder joint: Secondary | ICD-10-CM | POA: Diagnosis present

## 2022-08-21 DIAGNOSIS — Z7952 Long term (current) use of systemic steroids: Secondary | ICD-10-CM

## 2022-08-21 DIAGNOSIS — Z1152 Encounter for screening for COVID-19: Secondary | ICD-10-CM

## 2022-08-21 DIAGNOSIS — Z79899 Other long term (current) drug therapy: Secondary | ICD-10-CM | POA: Diagnosis not present

## 2022-08-21 DIAGNOSIS — Z66 Do not resuscitate: Secondary | ICD-10-CM | POA: Diagnosis not present

## 2022-08-21 DIAGNOSIS — I5033 Acute on chronic diastolic (congestive) heart failure: Secondary | ICD-10-CM | POA: Diagnosis present

## 2022-08-21 DIAGNOSIS — I480 Paroxysmal atrial fibrillation: Secondary | ICD-10-CM | POA: Diagnosis present

## 2022-08-21 DIAGNOSIS — Z96641 Presence of right artificial hip joint: Secondary | ICD-10-CM | POA: Diagnosis present

## 2022-08-21 DIAGNOSIS — M1990 Unspecified osteoarthritis, unspecified site: Secondary | ICD-10-CM | POA: Diagnosis present

## 2022-08-21 DIAGNOSIS — N183 Chronic kidney disease, stage 3 unspecified: Secondary | ICD-10-CM | POA: Diagnosis present

## 2022-08-21 DIAGNOSIS — G8929 Other chronic pain: Secondary | ICD-10-CM | POA: Diagnosis present

## 2022-08-21 DIAGNOSIS — M48061 Spinal stenosis, lumbar region without neurogenic claudication: Secondary | ICD-10-CM | POA: Diagnosis present

## 2022-08-21 DIAGNOSIS — N401 Enlarged prostate with lower urinary tract symptoms: Secondary | ICD-10-CM | POA: Diagnosis present

## 2022-08-21 DIAGNOSIS — E871 Hypo-osmolality and hyponatremia: Secondary | ICD-10-CM | POA: Diagnosis present

## 2022-08-21 DIAGNOSIS — R0602 Shortness of breath: Secondary | ICD-10-CM | POA: Diagnosis not present

## 2022-08-21 DIAGNOSIS — M353 Polymyalgia rheumatica: Secondary | ICD-10-CM | POA: Diagnosis present

## 2022-08-21 DIAGNOSIS — Z888 Allergy status to other drugs, medicaments and biological substances status: Secondary | ICD-10-CM

## 2022-08-21 DIAGNOSIS — E785 Hyperlipidemia, unspecified: Secondary | ICD-10-CM | POA: Diagnosis present

## 2022-08-21 DIAGNOSIS — I130 Hypertensive heart and chronic kidney disease with heart failure and stage 1 through stage 4 chronic kidney disease, or unspecified chronic kidney disease: Secondary | ICD-10-CM | POA: Diagnosis not present

## 2022-08-21 DIAGNOSIS — J449 Chronic obstructive pulmonary disease, unspecified: Secondary | ICD-10-CM | POA: Diagnosis present

## 2022-08-21 DIAGNOSIS — I509 Heart failure, unspecified: Secondary | ICD-10-CM

## 2022-08-21 LAB — COMPREHENSIVE METABOLIC PANEL
ALT: 27 U/L (ref 0–44)
ALT: 25 U/L (ref 0–44)
AST: 50 U/L — ABNORMAL HIGH (ref 15–41)
AST: 44 U/L — ABNORMAL HIGH (ref 15–41)
Albumin: 3.1 g/dL — ABNORMAL LOW (ref 3.5–5.0)
Albumin: 2.8 g/dL — ABNORMAL LOW (ref 3.5–5.0)
Alkaline Phosphatase: 49 U/L (ref 38–126)
Alkaline Phosphatase: 45 U/L (ref 38–126)
Anion gap: 10 (ref 5–15)
Anion gap: 9 (ref 5–15)
BUN: 40 mg/dL — ABNORMAL HIGH (ref 8–23)
BUN: 37 mg/dL — ABNORMAL HIGH (ref 8–23)
CO2: 23 mmol/L (ref 22–32)
CO2: 22 mmol/L (ref 22–32)
Calcium: 8.5 mg/dL — ABNORMAL LOW (ref 8.9–10.3)
Calcium: 8.2 mg/dL — ABNORMAL LOW (ref 8.9–10.3)
Chloride: 88 mmol/L — ABNORMAL LOW (ref 98–111)
Chloride: 92 mmol/L — ABNORMAL LOW (ref 98–111)
Creatinine, Ser: 1.77 mg/dL — ABNORMAL HIGH (ref 0.61–1.24)
Creatinine, Ser: 1.66 mg/dL — ABNORMAL HIGH (ref 0.61–1.24)
GFR, Estimated: 36 mL/min — ABNORMAL LOW (ref >60)
GFR, Estimated: 38 mL/min — ABNORMAL LOW (ref >60)
Glucose, Bld: 122 mg/dL — ABNORMAL HIGH (ref 70–99)
Glucose, Bld: 91 mg/dL (ref 70–99)
Potassium: 4.8 mmol/L (ref 3.5–5.1)
Potassium: 4.3 mmol/L (ref 3.5–5.1)
Sodium: 121 mmol/L — ABNORMAL LOW (ref 135–145)
Sodium: 123 mmol/L — ABNORMAL LOW (ref 135–145)
Total Bilirubin: 0.9 mg/dL (ref 0.3–1.2)
Total Bilirubin: 0.9 mg/dL (ref 0.3–1.2)
Total Protein: 6 g/dL — ABNORMAL LOW (ref 6.5–8.1)
Total Protein: 5.6 g/dL — ABNORMAL LOW (ref 6.5–8.1)

## 2022-08-21 LAB — OSMOLALITY: Osmolality: 266 mOsm/kg — ABNORMAL LOW (ref 275–295)

## 2022-08-21 LAB — LIPID PANEL
Cholesterol: 206 mg/dL — ABNORMAL HIGH (ref 0–200)
HDL: 97 mg/dL (ref >40)
LDL Cholesterol: 98 mg/dL (ref 0–99)
Total CHOL/HDL Ratio: 2.1 RATIO
Triglycerides: 54 mg/dL (ref <150)
VLDL: 11 mg/dL (ref 0–40)

## 2022-08-21 LAB — CBC
HCT: 33 % — ABNORMAL LOW (ref 39.0–52.0)
Hemoglobin: 11.5 g/dL — ABNORMAL LOW (ref 13.0–17.0)
MCH: 32.2 pg (ref 26.0–34.0)
MCHC: 34.8 g/dL (ref 30.0–36.0)
MCV: 92.4 fL (ref 80.0–100.0)
Platelets: 276 10*3/uL (ref 150–400)
RBC: 3.57 MIL/uL — ABNORMAL LOW (ref 4.22–5.81)
RDW: 13.9 % (ref 11.5–15.5)
WBC: 9.1 10*3/uL (ref 4.0–10.5)
nRBC: 0 % (ref 0.0–0.2)

## 2022-08-21 LAB — BASIC METABOLIC PANEL
Anion gap: 12 (ref 5–15)
BUN: 40 mg/dL — ABNORMAL HIGH (ref 8–23)
CO2: 20 mmol/L — ABNORMAL LOW (ref 22–32)
Calcium: 8.6 mg/dL — ABNORMAL LOW (ref 8.9–10.3)
Chloride: 89 mmol/L — ABNORMAL LOW (ref 98–111)
Creatinine, Ser: 1.85 mg/dL — ABNORMAL HIGH (ref 0.61–1.24)
GFR, Estimated: 34 mL/min — ABNORMAL LOW (ref >60)
Glucose, Bld: 106 mg/dL — ABNORMAL HIGH (ref 70–99)
Potassium: 4.8 mmol/L (ref 3.5–5.1)
Sodium: 121 mmol/L — ABNORMAL LOW (ref 135–145)

## 2022-08-21 LAB — TROPONIN I (HIGH SENSITIVITY)
Troponin I (High Sensitivity): 10 ng/L (ref <18)
Troponin I (High Sensitivity): 10 ng/L (ref <18)

## 2022-08-21 LAB — URINALYSIS, COMPLETE (UACMP) WITH MICROSCOPIC
Bilirubin Urine: NEGATIVE
Glucose, UA: NEGATIVE mg/dL
Hgb urine dipstick: NEGATIVE
Ketones, ur: NEGATIVE mg/dL
Leukocytes,Ua: NEGATIVE
Nitrite: NEGATIVE
Protein, ur: NEGATIVE mg/dL
Specific Gravity, Urine: 1.01 (ref 1.005–1.030)
Squamous Epithelial / HPF: NONE SEEN /HPF (ref 0–5)
WBC, UA: NONE SEEN WBC/hpf (ref 0–5)
pH: 5 (ref 5.0–8.0)

## 2022-08-21 LAB — SODIUM, URINE, RANDOM: Sodium, Ur: 16 mmol/L

## 2022-08-21 LAB — RESP PANEL BY RT-PCR (RSV, FLU A&B, COVID)  RVPGX2
Influenza A by PCR: NEGATIVE
Influenza B by PCR: NEGATIVE
Resp Syncytial Virus by PCR: NEGATIVE
SARS Coronavirus 2 by RT PCR: NEGATIVE

## 2022-08-21 LAB — OSMOLALITY, URINE: Osmolality, Ur: 319 mOsm/kg (ref 300–900)

## 2022-08-21 LAB — BRAIN NATRIURETIC PEPTIDE: B Natriuretic Peptide: 477.3 pg/mL — ABNORMAL HIGH (ref 0.0–100.0)

## 2022-08-21 MED ORDER — SENNOSIDES-DOCUSATE SODIUM 8.6-50 MG PO TABS: 1.0000 | ORAL_TABLET | Freq: Every evening | ORAL | Status: DC | PRN

## 2022-08-21 MED ORDER — ZOLPIDEM TARTRATE 5 MG PO TABS: 5.0000 mg | ORAL_TABLET | Freq: Every evening | ORAL | Status: DC | PRN

## 2022-08-21 MED ORDER — TAMSULOSIN HCL 0.4 MG PO CAPS
0.4000 mg | ORAL_CAPSULE | Freq: Every day | ORAL | Status: DC
  Administered 2022-08-21 – 2022-08-23 (×3): 0.4 mg via ORAL
  Filled 2022-08-21 (×3): qty 1

## 2022-08-21 MED ORDER — HEPARIN SODIUM (PORCINE) 5000 UNIT/ML IJ SOLN: 5000.0000 [IU] | Freq: Three times a day (TID) | INTRAMUSCULAR | Status: DC

## 2022-08-21 MED ORDER — GABAPENTIN 300 MG PO CAPS
300.0000 mg | ORAL_CAPSULE | Freq: Every day | ORAL | Status: DC
  Administered 2022-08-21 – 2022-08-23 (×3): 300 mg via ORAL
  Filled 2022-08-21 (×3): qty 1

## 2022-08-21 MED ORDER — NITROGLYCERIN 0.4 MG SL SUBL: 0.4000 mg | SUBLINGUAL_TABLET | SUBLINGUAL | Status: DC | PRN

## 2022-08-21 MED ORDER — LOSARTAN POTASSIUM 50 MG PO TABS
50.0000 mg | ORAL_TABLET | Freq: Two times a day (BID) | ORAL | Status: DC
  Administered 2022-08-21 – 2022-08-24 (×6): 50 mg via ORAL
  Filled 2022-08-21 (×6): qty 1

## 2022-08-21 MED ORDER — SODIUM CHLORIDE 1 G PO TABS
1.0000 g | ORAL_TABLET | Freq: Two times a day (BID) | ORAL | Status: AC
  Administered 2022-08-21 – 2022-08-22 (×2): 1 g via ORAL
  Filled 2022-08-21 (×2): qty 1

## 2022-08-21 MED ORDER — HYDROCODONE-ACETAMINOPHEN 5-325 MG PO TABS
1.0000 | ORAL_TABLET | Freq: Four times a day (QID) | ORAL | Status: DC | PRN
  Administered 2022-08-21 – 2022-08-23 (×3): 1 via ORAL
  Filled 2022-08-21 (×3): qty 1

## 2022-08-21 MED ORDER — AMLODIPINE BESYLATE 10 MG PO TABS
10.0000 mg | ORAL_TABLET | Freq: Every day | ORAL | Status: DC
  Administered 2022-08-21 – 2022-08-23 (×3): 10 mg via ORAL
  Filled 2022-08-21 (×2): qty 2
  Filled 2022-08-21 (×2): qty 1

## 2022-08-21 MED ORDER — APIXABAN 2.5 MG PO TABS
2.5000 mg | ORAL_TABLET | Freq: Two times a day (BID) | ORAL | Status: DC
  Administered 2022-08-21 – 2022-08-24 (×6): 2.5 mg via ORAL
  Filled 2022-08-21 (×7): qty 1

## 2022-08-21 MED ORDER — ROSUVASTATIN CALCIUM 5 MG PO TABS
5.0000 mg | ORAL_TABLET | Freq: Every day | ORAL | Status: DC
  Administered 2022-08-21 – 2022-08-23 (×3): 5 mg via ORAL
  Filled 2022-08-21 (×4): qty 1

## 2022-08-21 MED ORDER — ONDANSETRON HCL 4 MG PO TABS: 4.0000 mg | ORAL_TABLET | Freq: Four times a day (QID) | ORAL | Status: DC | PRN

## 2022-08-21 MED ORDER — PREDNISONE 10 MG PO TABS
5.0000 mg | ORAL_TABLET | Freq: Every day | ORAL | Status: DC
  Administered 2022-08-22 – 2022-08-24 (×3): 5 mg via ORAL
  Filled 2022-08-21 (×3): qty 1

## 2022-08-21 MED ORDER — GABAPENTIN 100 MG PO CAPS
100.0000 mg | ORAL_CAPSULE | Freq: Every day | ORAL | Status: DC
  Administered 2022-08-22 – 2022-08-24 (×3): 100 mg via ORAL
  Filled 2022-08-21 (×3): qty 1

## 2022-08-21 MED ORDER — ACETAMINOPHEN 325 MG PO TABS: 650.0000 mg | ORAL_TABLET | Freq: Four times a day (QID) | ORAL | Status: DC | PRN

## 2022-08-21 MED ORDER — HYDROCODONE-ACETAMINOPHEN 5-325 MG PO TABS
1.0000 | ORAL_TABLET | Freq: Once | ORAL | Status: AC
  Administered 2022-08-21: 1 via ORAL
  Filled 2022-08-21: qty 1

## 2022-08-21 MED ORDER — AMIODARONE HCL 200 MG PO TABS
100.0000 mg | ORAL_TABLET | Freq: Every day | ORAL | Status: DC
  Administered 2022-08-22 – 2022-08-24 (×3): 100 mg via ORAL
  Filled 2022-08-21 (×4): qty 1

## 2022-08-21 MED ORDER — ONDANSETRON HCL 4 MG/2ML IJ SOLN: 4.0000 mg | Freq: Four times a day (QID) | INTRAMUSCULAR | Status: DC | PRN

## 2022-08-21 MED ORDER — SODIUM CHLORIDE 0.9 % IV SOLN: INTRAVENOUS | Status: DC

## 2022-08-21 MED ORDER — HYDRALAZINE HCL 20 MG/ML IJ SOLN: 5.0000 mg | Freq: Three times a day (TID) | INTRAMUSCULAR | Status: DC | PRN

## 2022-08-21 MED ORDER — ACETAMINOPHEN 650 MG RE SUPP: 650.0000 mg | Freq: Four times a day (QID) | RECTAL | Status: DC | PRN

## 2022-08-21 NOTE — ED Notes (Addendum)
18f in/out cath performed using sterile technique for urine sample and to drain bladder. Verbal order from Dr. Tobie Poet that she only wants an in/out cath, no indwelling cath. 751ml urine drained

## 2022-08-21 NOTE — Assessment & Plan Note (Addendum)
-  Resumed home amlodipine 10 mg daily, losartan 50 mg daily - Chlorthalidone not resumed on admission due to hyponatremia - Hydralazine 5 mg IV every 8 hours as needed for SBP greater than 175, 4 days ordered

## 2022-08-21 NOTE — ED Notes (Signed)
Patient requesting Norco pain medication for left leg/hip pain

## 2022-08-21 NOTE — Assessment & Plan Note (Signed)
-  Strict I's and O's - Complete echo ordered - Deferring IV Lasix at this time given moderate hyponatremia

## 2022-08-21 NOTE — ED Provider Notes (Signed)
[facility] Provider Note    Event Date/Time   First MD Initiated Contact with Patient 08/21/22 1218     (approximate)   History   Urinary Retention and Shortness of Breath   HPI  Caleb Khan is a 89 y.o. male with a history of CHF, spinal stenosis who presents with complaints of lower extremity swelling, shortness of breath with exertion and possible urinary retention.  Patient reports he has started taking his Lasix daily as opposed to every other day to try to treat his fluid but it has not helped.  No chest pain     Physical Exam   Triage Vital Signs: ED Triage Vitals  Enc Vitals Group     BP 08/21/22 0840 (!) 151/63     Pulse Rate 08/21/22 0840 65     Resp 08/21/22 0840 18     Temp 08/21/22 0840 97.6 F (36.4 C)     Temp src --      SpO2 08/21/22 0840 94 %     Weight --      Height --      Head Circumference --      Peak Flow --      Pain Score 08/21/22 0838 0     Pain Loc --      Pain Edu? --      Excl. in [place]? --     Most recent vital signs: Vitals:   08/21/22 0840  BP: (!) 151/63  Pulse: 65  Resp: 18  Temp: 97.6 F (36.4 C)  SpO2: 94%     General: Awake, no distress.  CV:  Good peripheral perfusion.  Resp:  Normal effort.  Bibasilar Rales Abd:  No distention.  Other:  2+ edema bilaterally   ED Results / Procedures / Treatments   Labs (all labs ordered are listed, but only abnormal results are displayed) Labs Reviewed  BASIC METABOLIC PANEL - Abnormal; Notable for the following components:      Result Value   Sodium 121 (*)    Chloride 89 (*)    CO2 20 (*)    Glucose, Bld 106 (*)    BUN 40 (*)    Creatinine, Ser 1.85 (*)    Calcium 8.6 (*)    GFR, Estimated 34 (*)    All other components within normal limits  CBC - Abnormal; Notable for the following components:   RBC 3.57 (*)    Hemoglobin 11.5 (*)    HCT 33.0 (*)    All other components within normal limits  BRAIN NATRIURETIC PEPTIDE - Abnormal;  Notable for the following components:   B Natriuretic Peptide 477.3 (*)    All other components within normal limits  RESP PANEL BY RT-PCR (RSV, FLU A&B, COVID)  [id number]  TROPONIN I (HIGH SENSITIVITY)  TROPONIN I (HIGH SENSITIVITY)     EKG  ED ECG REPORT I, Lavonia Drafts, the attending physician, personally viewed and interpreted this ECG.  Date: 08/21/2022  Rhythm: normal sinus rhythm QRS Axis: normal Intervals: normal ST/T Wave abnormalities: normal Narrative Interpretation: no evidence of acute ischemia    RADIOLOGY Chest x-ray viewed interpret by me, no evidence of significant pulmonary edema    PROCEDURES:  Critical Care performed: CRITICAL CARE Performed by: Lavonia Drafts   Total critical care time: 30 minutes  Critical care time was exclusive of separately billable procedures and treating other patients.  Critical care was necessary to treat or prevent imminent or life-threatening deterioration.  Critical  care was time spent personally by me on the following activities: development of treatment plan with patient and/or surrogate as well as nursing, discussions with consultants, evaluation of patient's response to treatment, examination of patient, obtaining history from patient or surrogate, ordering and performing treatments and interventions, ordering and review of laboratory studies, ordering and review of radiographic studies, pulse oximetry and re-evaluation of patient's condition.   Procedures   MEDICATIONS ORDERED IN ED: Medications  acetaminophen (TYLENOL) tablet 650 mg (has no administration in time range)    Or  acetaminophen (TYLENOL) suppository 650 mg (has no administration in time range)  ondansetron (ZOFRAN) tablet 4 mg (has no administration in time range)    Or  ondansetron (ZOFRAN) injection 4 mg (has no administration in time range)  heparin injection 5,000 Units (has no administration in time range)  senna-docusate (Senokot-S) tablet 1  tablet (has no administration in time range)  HYDROcodone-acetaminophen (NORCO/VICODIN) 5-325 MG per tablet 1 tablet (1 tablet Oral Given 08/21/22 1328)     IMPRESSION / MDM / ASSESSMENT AND PLAN / ED COURSE  I reviewed the triage vital signs and the nursing notes. Patient's presentation is most consistent with acute presentation with potential threat to life or bodily function.   Patient presents with lower extremity swelling as above in the setting of CHF.  Suspect CHF exacerbation, he complains of some urinary retention but no significant suprapubic pain or swelling.  BNP is elevated at 473, he is significantly hyponatremic, typical sodium is around 130, today is 121  High sensitive troponin is reassuring, chest x-ray without evidence of edema  He will require admission for diuresis, sodium correction given critically low sodium  I discussed with the hospitalist       FINAL CLINICAL IMPRESSION(S) / ED DIAGNOSES   Final diagnoses:  Acute on chronic congestive heart failure, unspecified heart failure type ([place])  Hyponatremia     Rx / DC Orders   ED Discharge Orders     None        Note:  This document was prepared using Dragon voice recognition software and may include unintentional dictation errors.   Lavonia Drafts, MD 08/21/22 [phone]

## 2022-08-21 NOTE — Hospital Course (Signed)
Mr. Caleb Khan is a 89 year old male with history of heart failure preserved ejection fraction, aortic atherosclerosis, hypertension, hyperlipidemia, insomnia, centrilobar emphysema, CKD 3B, urinary incontinence, bilateral lower extremity edema, paroxysmal atrial fibrillation, on Eliquis, CAD, polymyalgia rheumatica syndrome, who presents to the emergency department for chief concerns of shortness of breath with exertion and worsening bilateral lower extremity swelling along with urinary retention and weight gain.  Initial vitals in the ED showed temperature 97.6, respiration rate of 18, heart rate of 65, blood pressure 151/63, SpO2 of 94% on room air.  Serum sodium is 121, potassium 4.8, chloride 89, bicarb 20, BUN of 40, serum creatinine of 1.85, nonfasting blood glucose 106, EGFR 34, WBC 9.1, hemoglobin 11.5, platelets of 276.  BNP is 477.3.  High sensitive troponin was 10.  ED treatment: Hydrocodone 5-325 mg p.o. one-time dose.

## 2022-08-21 NOTE — Assessment & Plan Note (Addendum)
Asymptomatic, moderate hyponatremia - Etiology workup in progress, I suspect this is secondary to hypotonic, hypovolemic hyponatremia in setting of acute on chronic heart failure exacerbation - Clinically, patient appears to be intravascularly dry in setting of hypotonic hypovolemia hyponatremia - Strict I's and O's - Check serum osmolality, urine osmolality, urine sodium level - Check lipid panel - Sodium chloride 1 g tablet, twice daily, 2 doses ordered - Sodium chloride 100 mL/h, 10 hours ordered - Recheck CMP every 6 hours, 2 times - Holding home chlorthalidone - Admit to telemetry cardiac, inpatient

## 2022-08-21 NOTE — Assessment & Plan Note (Addendum)
-  Eliquis 2.5 mg p.o. twice daily and amiodarone 100 mg daily, resumed on admission

## 2022-08-21 NOTE — ED Notes (Signed)
Patient currently getting echo at bedside

## 2022-08-21 NOTE — H&P (Addendum)
History and Physical   Caleb Khan ZOX:[id number] DOB: 08-03-1927 DOA: 08/21/2022  PCP: Dion Body, MD  Outpatient Specialists: Dr. Edd Arbour clinic cardiology Patient coming from: Home via [place]  I have personally briefly reviewed patient's old medical records in [place].  Chief Concern: Shortness of breath and weight gain  HPI: Mr. Caleb Khan is a 89 year old male with history of heart failure preserved ejection fraction, aortic atherosclerosis, hypertension, hyperlipidemia, insomnia, centrilobar emphysema, CKD 3B, urinary incontinence, bilateral lower extremity edema, paroxysmal atrial fibrillation, on Eliquis, CAD, polymyalgia rheumatica syndrome, who presents to the emergency department for chief concerns of shortness of breath with exertion and worsening bilateral lower extremity swelling along with urinary retention and weight gain.  Initial vitals in the ED showed temperature 97.6, respiration rate of 18, heart rate of 65, blood pressure 151/63, SpO2 of 94% on room air.  Serum sodium is 121, potassium 4.8, chloride 89, bicarb 20, BUN of 40, serum creatinine of 1.85, nonfasting blood glucose 106, EGFR 34, WBC 9.1, hemoglobin 11.5, platelets of 276.  BNP is 477.3.  High sensitive troponin was 10.  ED treatment: Hydrocodone 5-325 mg p.o. one-time dose. -------------------- At bedside, he is able to tell me his name, age, current location, current calendar year.  He reports he went to his PCP for chief concern of difficulty urinating for two weeks. He denies chest pain. He endorses shortness of breath, bilateral lower extremity edema and weight gain, approximately 15-20 pounds.   He increased his home lasix about one week ago. At baseline, he takes 1/2 tablet lasix (10 mg) every other day and increased to 1/2 tablet every day.   He denies fever, vomiting, chest pain, abdominal pain. He endorses nausea and increased urinary urgency.   Social history: He  lives with his spouse. He denies tobacco use. He endorses infrequent etoh use, 4 oz of wine. He is retired and formerly was a Training and development officer.   ROS: Constitutional: no weight change, no fever ENT/Mouth: no sore throat, no rhinorrhea Eyes: no eye pain, no vision changes Cardiovascular: no chest pain, + dyspnea,  + bilateral lower extremity edema, no palpitations Respiratory: no cough, no sputum, no wheezing Gastrointestinal: no nausea, no vomiting, no diarrhea, no constipation Genitourinary: no urinary incontinence, no dysuria, no hematuria Musculoskeletal: no arthralgias, no myalgias Skin: no skin lesions, no pruritus, Neuro: + weakness, no loss of consciousness, no syncope Psych: no anxiety, no depression, + decrease appetite Heme/Lymph: no bruising, no bleeding  ED Course: Discussed with emergency medicine provider, patient requiring hospitalization for chief concerns of heart failure exacerbation.  Assessment/Plan  Principal Problem:   Hyponatremia Active Problems:   CKD (chronic kidney disease), stage IIIb   COPD (chronic obstructive pulmonary disease) (HCC)   Coronary artery disease   Hypertension   Polymyalgia rheumatica (HCC)   Acute exacerbation of CHF (congestive heart failure) (HCC)   Paroxysmal A-fib (HCC)   Acute on chronic urinary retention   Assessment and Plan:  * Hyponatremia Asymptomatic, moderate hyponatremia - Etiology workup in progress, I suspect this is secondary to hypotonic, hypovolemic hyponatremia in setting of acute on chronic heart failure exacerbation - Clinically, patient appears to be intravascularly dry in setting of hypotonic hypovolemia hyponatremia - Strict I's and O's - Check serum osmolality, urine osmolality, urine sodium level - Check lipid panel - Sodium chloride 1 g tablet, twice daily, 2 doses ordered - Sodium chloride 100 mL/h, 10 hours ordered - Recheck CMP every 6 hours, 2 times -  Holding home  chlorthalidone - Admit to telemetry cardiac, inpatient  Acute on chronic urinary retention - Presumed multifactorial including secondary to BPH - Strict I's and O's - Check a UA - In and out catheter ordered, one-time, discussed with nursing staff that I do not want a Foley catheter - Resumed home tamsulosin 0.4 mg daily  Paroxysmal A-fib (HCC) - Eliquis 2.5 mg p.o. twice daily and amiodarone 100 mg daily, resumed on admission  Acute exacerbation of CHF (congestive heart failure) (HCC) - Strict I's and O's - Complete echo ordered - Deferring IV Lasix at this time given moderate hyponatremia  Polymyalgia rheumatica (HCC) - Resumed home prednisone 5 mg daily  Hypertension - Resumed home amlodipine 10 mg daily, losartan 50 mg daily - Chlorthalidone not resumed on admission due to hyponatremia - Hydralazine 5 mg IV every 8 hours as needed for SBP greater than 175, 4 days ordered  Coronary artery disease - Rosuvastatin 5 mg daily resumed  Pending med reconciliation.  Chart reviewed.   DVT prophylaxis: Eliquis 2.5 mg BID Code Status: DNR. He is okay with intubation and shocks and pressor support Diet: Regular diet due to patient presenting with hyponatremia; AM team to order cardiac diet when appropriate Family Communication: [name], spouse at bedside, with patient's permission Disposition Plan: Pending clinical course Consults called: none at this time Admission status: Inpatient, telemetry cardiac  Past Medical History:  Diagnosis Date   Anemia    Anginal pain (HCC)    Arthritis    osteoarthritis   Chronic kidney disease    stage 3   COPD (chronic obstructive pulmonary disease) (HCC)    NO INHALERS   Coronary artery disease    Dyspnea    with exertion   Hypertension    Polymyalgia rheumatica (HCC)    TAKES PREDNISONE   Spinal stenosis    Past Surgical History:  Procedure Laterality Date   ANTERIOR APPROACH HEMI HIP ARTHROPLASTY Right 07/09/2021   Procedure:  ANTERIOR APPROACH HEMI HIP ARTHROPLASTY;  Surgeon: Lyndle Herrlich, MD;  Location: [facility] ORS;  Service: Orthopedics;  Laterality: Right;   CARDIAC CATHETERIZATION Left 05/22/2016   Procedure: Left Heart Cath and Coronary Angiography;  Surgeon: Alwyn Pea, MD;  Location: [facility] INVASIVE CV LAB;  Service: Cardiovascular;  Laterality: Left;   EYE SURGERY Bilateral    Cataract Extraction with IOL   GREEN LIGHT LASER TURP (TRANSURETHRAL RESECTION OF PROSTATE N/A 04/26/2018   Procedure: GREEN LIGHT LASER TURP (TRANSURETHRAL RESECTION OF PROSTATE;  Surgeon: Orson Ape, MD;  Location: [facility] ORS;  Service: Urology;  Laterality: N/A;   INTRAVASCULAR PRESSURE WIRE/FFR STUDY N/A 09/15/2018   Procedure: INTRAVASCULAR PRESSURE WIRE/FFR STUDY;  Surgeon: Alwyn Pea, MD;  Location: [facility] INVASIVE CV LAB;  Service: Cardiovascular;  Laterality: N/A;   JOINT REPLACEMENT Right 2004   Shoulder Replacement   JOINT REPLACEMENT Left 2007   Ankle Replacement   LEFT HEART CATH AND CORONARY ANGIOGRAPHY Left 09/15/2018   Procedure: LEFT HEART CATH AND CORONARY ANGIOGRAPHY;  Surgeon: Alwyn Pea, MD;  Location: [facility] INVASIVE CV LAB;  Service: Cardiovascular;  Laterality: Left;   PATELLAR TENDON REPAIR Left 07/15/2017   Procedure: PATELLA TENDON REPAIR;  Surgeon: Kennedy Bucker, MD;  Location: [facility] ORS;  Service: Orthopedics;  Laterality: Left;   QUADRICEPS TENDON REPAIR Left 07/15/2017   Procedure: REPAIR QUADRICEP TENDON;  Surgeon: Kennedy Bucker, MD;  Location: [facility] ORS;  Service: Orthopedics;  Laterality: Left;   TONSILLECTOMY     VASECTOMY  Social History:  reports that he quit smoking about 50 years ago. His smoking use included cigarettes. He has a 44.00 pack-year smoking history. He has never used smokeless tobacco. He reports current alcohol use of about 7.0 standard drinks of alcohol per week. He reports that he does not use drugs.  Allergies  Allergen Reactions   Benazepril Cough    Family History  Problem Relation Age of Onset   Hypertension Mother    Family history: Family history reviewed and not pertinent.  Prior to Admission medications   Medication Sig Start Date End Date Taking? Authorizing Provider  acetaminophen (TYLENOL) 500 MG tablet Take 1,000 mg by mouth every 8 (eight) hours as needed (pain).    [provider]  amiodarone (PACERONE) 200 MG tablet Take 1 tablet (200 mg total) by mouth 2 (two) times daily. Patient taking differently: Take 100 mg by mouth daily. 01/23/21   Sharen Hones, MD  amLODipine (NORVASC) 10 MG tablet Take 10 mg by mouth daily. 01/01/21   [provider]  apixaban (ELIQUIS) 5 MG TABS tablet Take 1 tablet (5 mg total) by mouth 2 (two) times daily. Patient taking differently: Take 2.5 mg by mouth 2 (two) times daily. 01/23/21   Sharen Hones, MD  Calcium Carb-Cholecalciferol 500-400 MG-UNIT TABS Take 1 tablet by mouth daily.    [provider]  docusate sodium (COLACE) 100 MG capsule Take 100 mg by mouth every evening.    [provider]  furosemide (LASIX) 20 MG tablet Take 1 tablet (20 mg total) by mouth daily for 7 days. 07/12/21 07/19/21  Barb Merino, MD  gabapentin (NEURONTIN) 100 MG capsule Take 100 mg by mouth daily. 11/27/20   [provider]  HYDROcodone-acetaminophen (NORCO/VICODIN) 5-325 MG tablet Take 1 tablet by mouth every 6 (six) hours as needed for moderate pain. 07/10/21   Carlynn Spry, PA-C  METAMUCIL FIBER PO Take by mouth.    [provider]  Multiple Vitamins-Minerals (MULTIVITAMIN PO) Take 1 tablet by mouth daily.    [provider]  nitroGLYCERIN (NITROSTAT) 0.4 MG SL tablet Place 0.4 mg under the tongue every 5 (five) minutes as needed for chest pain.    [provider]  predniSONE (DELTASONE) 10 MG tablet Take 5 mg by mouth daily with breakfast.    [provider]  rosuvastatin (CRESTOR) 5 MG tablet Take 5 mg by mouth daily.     [provider]  zolpidem (AMBIEN) 5 MG tablet Take 5 mg by mouth at bedtime as needed for sleep.    [provider]   Physical Exam: Vitals:   08/21/22 1730 08/21/22 1800 08/21/22 1804 08/21/22 1830  BP: (!) 119/58 (!) 141/82  (!) 138/58  Pulse: 68 67  66  Resp: 14 16  10   Temp:   97.8 F (36.6 C)   TempSrc:   Oral   SpO2: (!) 88% (!) 87%  93%   Constitutional: appears age-appropriate, NAD, calm, comfortable Eyes: PERRL, lids and conjunctivae normal ENMT: Mucous membranes are moist. Posterior pharynx clear of any exudate or lesions. Age-appropriate dentition. Hearing appropriate Neck: normal, supple, no masses, no thyromegaly Respiratory: clear to auscultation bilaterally, no wheezing, no crackles. Normal respiratory effort. No accessory muscle use.  Cardiovascular: Regular rate and rhythm, no murmurs / rubs / gallops.  Bilateral lower extremity edema, 2+ pitting. 2+ pedal pulses. No carotid bruits.  Abdomen: no tenderness, no masses palpated, no hepatosplenomegaly. Bowel sounds positive.  Musculoskeletal: no clubbing / cyanosis. No joint deformity  upper and lower extremities. Good ROM, no contractures, no atrophy. Normal muscle tone.  Skin: no rashes, lesions, ulcers. No induration Neurologic: Sensation intact. Strength 5/5 in all 4.  Psychiatric: Normal judgment and insight. Alert and oriented x 3. Normal mood.   EKG: independently reviewed, showing sinus rhythm with first-degree AV block present, rate of 67, QTc 429  Chest x-ray on Admission: I personally reviewed and I agree with radiologist reading as below.  DG Chest 2 View  Result Date: 08/21/2022 CLINICAL DATA:  Chest pain, shortness of breath with exertion, peripheral swelling, urinary retention, 20 pound weight gain EXAM: CHEST - 2 VIEW COMPARISON:  07/11/2021 FINDINGS: Upper normal size of cardiac silhouette. Mediastinal contours and pulmonary vascularity normal. Atherosclerotic calcification aorta. Mild  biapical scarring. Lungs otherwise clear. No acute infiltrate or pulmonary edema. No pleural effusion or pneumothorax. Bones demineralized with RIGHT shoulder prosthesis and age-indeterminate compression deformity of a lower thoracic vertebra. IMPRESSION: Biapical scarring. No acute abnormalities. Aortic Atherosclerosis (ICD10-I70.0). Electronically Signed   By: Ulyses Southward M.D.   On: 08/21/2022 09:02    Labs on Admission: I have personally reviewed following labs  CBC: Recent Labs  Lab 08/21/22 0844  WBC 9.1  HGB 11.5*  HCT 33.0*  MCV 92.4  PLT 276   Basic Metabolic Panel: Recent Labs  Lab 08/21/22 0844 08/21/22 1502  NA 121* 121*  K 4.8 4.8  CL 89* 88*  CO2 20* 23  GLUCOSE 106* 122*  BUN 40* 40*  CREATININE 1.85* 1.77*  CALCIUM 8.6* 8.5*   GFR: CrCl cannot be calculated (Unknown ideal weight.).  Urine analysis:    Component Value Date/Time   COLORURINE YELLOW (A) 08/21/2022 1502   APPEARANCEUR CLEAR (A) 08/21/2022 1502   LABSPEC 1.010 08/21/2022 1502   PHURINE 5.0 08/21/2022 1502   GLUCOSEU NEGATIVE 08/21/2022 1502   HGBUR NEGATIVE 08/21/2022 1502   BILIRUBINUR NEGATIVE 08/21/2022 1502   KETONESUR NEGATIVE 08/21/2022 1502   PROTEINUR NEGATIVE 08/21/2022 1502   NITRITE NEGATIVE 08/21/2022 1502   LEUKOCYTESUR NEGATIVE 08/21/2022 1502   This document was prepared using Dragon Voice Recognition software and may include unintentional dictation errors.  Dr. Sedalia Muta [facility]  If 7PM-7AM, please contact overnight-coverage provider If 7AM-7PM, please contact day coverage provider [email or web address]  08/21/2022, 6:59 PM

## 2022-08-21 NOTE — ED Notes (Signed)
Patient oxygen sat decreased to 87% on RA. Patient placed on 2L [place]. Oxygen sat increased to 95% on 2L

## 2022-08-21 NOTE — ED Notes (Signed)
Bladder scan completed at this time. Scan resulted 445mL in the bladder.

## 2022-08-21 NOTE — Assessment & Plan Note (Signed)
-  Resumed home prednisone 5 mg daily

## 2022-08-21 NOTE — Assessment & Plan Note (Addendum)
-  Presumed multifactorial including secondary to BPH - Strict I's and O's - Check a UA - In and out catheter ordered, one-time, discussed with nursing staff that I do not want a Foley catheter - Resumed home tamsulosin 0.4 mg daily

## 2022-08-21 NOTE — ED Notes (Signed)
Pharmacy notified wife is at bedside with home medication list

## 2022-08-21 NOTE — Progress Notes (Signed)
*  PRELIMINARY RESULTS* Echocardiogram 2D Echocardiogram has been performed.  Elpidio Anis 08/21/2022, 4:25 PM

## 2022-08-21 NOTE — Assessment & Plan Note (Signed)
-  Rosuvastatin 5 mg daily resumed 

## 2022-08-21 NOTE — ED Triage Notes (Addendum)
Pt to ED via POV from home. Pt was seen and sent over by [facility]. Pt is having SOB with exertion, swelling from extremities up to abdomen, urinary retention and approximately 20lb weight gain. Pt reports symptoms have been present x1 wk. Pt states has been taking lasix everyday instead of every other day (as prescribed).

## 2022-08-22 ENCOUNTER — Inpatient Hospital Stay: Payer: Medicare PPO

## 2022-08-22 ENCOUNTER — Ambulatory Visit: Admission: RE | Admit: 2022-08-22 | Payer: Medicare PPO | Source: Ambulatory Visit

## 2022-08-22 DIAGNOSIS — N1832 Chronic kidney disease, stage 3b: Secondary | ICD-10-CM | POA: Diagnosis not present

## 2022-08-22 DIAGNOSIS — I5033 Acute on chronic diastolic (congestive) heart failure: Secondary | ICD-10-CM | POA: Diagnosis present

## 2022-08-22 DIAGNOSIS — I10 Essential (primary) hypertension: Secondary | ICD-10-CM

## 2022-08-22 DIAGNOSIS — I480 Paroxysmal atrial fibrillation: Secondary | ICD-10-CM

## 2022-08-22 DIAGNOSIS — E871 Hypo-osmolality and hyponatremia: Secondary | ICD-10-CM | POA: Diagnosis not present

## 2022-08-22 LAB — ECHOCARDIOGRAM COMPLETE
AR max vel: 2.41 cm2
AV Area VTI: 2.24 cm2
AV Area mean vel: 2.46 cm2
AV Mean grad: 6 mmHg
AV Peak grad: 11.2 mmHg
Ao pk vel: 1.67 m/s
Area-P 1/2: 4.17 cm2
MV VTI: 3 cm2
P 1/2 time: 585 msec
S' Lateral: 3.1 cm

## 2022-08-22 LAB — COMPREHENSIVE METABOLIC PANEL WITH GFR
ALT: 25 U/L (ref 0–44)
AST: 45 U/L — ABNORMAL HIGH (ref 15–41)
Albumin: 3 g/dL — ABNORMAL LOW (ref 3.5–5.0)
Alkaline Phosphatase: 46 U/L (ref 38–126)
Anion gap: 9 (ref 5–15)
BUN: 34 mg/dL — ABNORMAL HIGH (ref 8–23)
CO2: 22 mmol/L (ref 22–32)
Calcium: 8.3 mg/dL — ABNORMAL LOW (ref 8.9–10.3)
Chloride: 94 mmol/L — ABNORMAL LOW (ref 98–111)
Creatinine, Ser: 1.54 mg/dL — ABNORMAL HIGH (ref 0.61–1.24)
GFR, Estimated: 42 mL/min — ABNORMAL LOW (ref >60)
Glucose, Bld: 83 mg/dL (ref 70–99)
Potassium: 4 mmol/L (ref 3.5–5.1)
Sodium: 125 mmol/L — ABNORMAL LOW (ref 135–145)
Total Bilirubin: 1 mg/dL (ref 0.3–1.2)
Total Protein: 5.6 g/dL — ABNORMAL LOW (ref 6.5–8.1)

## 2022-08-22 LAB — CBC
HCT: 30.7 % — ABNORMAL LOW (ref 39.0–52.0)
Hemoglobin: 10.6 g/dL — ABNORMAL LOW (ref 13.0–17.0)
MCH: 31.6 pg (ref 26.0–34.0)
MCHC: 34.5 g/dL (ref 30.0–36.0)
MCV: 91.6 fL (ref 80.0–100.0)
Platelets: 278 10*3/uL (ref 150–400)
RBC: 3.35 MIL/uL — ABNORMAL LOW (ref 4.22–5.81)
RDW: 13.6 % (ref 11.5–15.5)
WBC: 7.1 10*3/uL (ref 4.0–10.5)
nRBC: 0 % (ref 0.0–0.2)

## 2022-08-22 MED ORDER — FUROSEMIDE 10 MG/ML IJ SOLN
20.0000 mg | Freq: Two times a day (BID) | INTRAMUSCULAR | Status: DC
  Administered 2022-08-22 – 2022-08-24 (×4): 20 mg via INTRAVENOUS
  Filled 2022-08-22 (×4): qty 4

## 2022-08-22 MED ORDER — BISACODYL 5 MG PO TBEC
10.0000 mg | DELAYED_RELEASE_TABLET | Freq: Every day | ORAL | Status: DC
  Administered 2022-08-22 – 2022-08-24 (×3): 10 mg via ORAL
  Filled 2022-08-22 (×3): qty 2

## 2022-08-22 MED ORDER — SENNOSIDES-DOCUSATE SODIUM 8.6-50 MG PO TABS
1.0000 | ORAL_TABLET | Freq: Two times a day (BID) | ORAL | Status: DC
  Administered 2022-08-22 – 2022-08-24 (×5): 1 via ORAL
  Filled 2022-08-22 (×5): qty 1

## 2022-08-22 NOTE — Progress Notes (Signed)
PT Cancellation Note  Patient Details Name: Caleb Khan MRN: [id number] DOB: 1927-09-15   Cancelled Treatment:    Reason Eval/Treat Not Completed: Other (comment).  PT consult received.  Chart reviewed.  Pt pending MRI of L-spine (d/t LBP and increased fracture risk).  Will hold PT at this time and re-attempt PT evaluation at a later date/time (once imaging completed and pt medically appropriate for therapy participation).  Leitha Bleak, PT 08/22/22, 2:44 PM

## 2022-08-22 NOTE — Progress Notes (Signed)
[facility] [place] NAME: Caleb Khan    MR#:  [id number]  DATE OF BIRTH:  1928/02/09  SUBJECTIVE:  wife at bedside. Patient came in from PCP's office with increased weight gain, leg edema, shortness of breath, found to be in congestive heart failure however received IV fluids per admitting physician. Patient remains short winded while completing sentence. Sats drop intermittently into the upper 80s. No chest pain. Has complained of back pain due to his lumbar spinal stenosis.    VITALS:  Blood pressure 129/68, pulse 66, temperature 98 F (36.7 C), temperature source Oral, resp. rate 19, height 5\' 3"  (1.6 m), weight 76.2 kg, SpO2 97 %.  PHYSICAL EXAMINATION:   GENERAL:  89 y.o.-year-old patient with mild  acute distress.  LUNGS: Normal breath sounds bilaterally, no wheezing. Bibasilar rales + CARDIOVASCULAR: S1, S2 normal. No murmur   ABDOMEN: Soft, nontender, nondistended. Bowel sounds present.  EXTREMITIES:bilateral 3+ pitting edema NEUROLOGIC: nonfocal  patient is alert and awake SKIN: per RN  LABORATORY PANEL:  CBC Recent Labs  Lab 08/22/22 0446  WBC 7.1  HGB 10.6*  HCT 30.7*  PLT 278    Chemistries  Recent Labs  Lab 08/22/22 0446  NA 125*  K 4.0  CL 94*  CO2 22  GLUCOSE 83  BUN 34*  CREATININE 1.54*  CALCIUM 8.3*  AST 45*  ALT 25  ALKPHOS 46  BILITOT 1.0   Cardiac Enzymes No results for input(s): "TROPONINI" in the last 168 hours. RADIOLOGY:  ECHOCARDIOGRAM COMPLETE  Result Date: 08/22/2022    ECHOCARDIOGRAM REPORT   Patient Name:   Caleb Khan Date of Exam: 08/21/2022 Medical Rec #:  [id number]     Height:       63.0 in Accession #:    [id number]    Weight:       139.6 lb Date of Birth:  May 14, 1928     BSA:          1.659 m Patient Age:    89 years      BP:           151/63 mmHg Patient Gender: M             HR:           69 bpm. Exam Location:  [facility] Procedure: 2D Echo, Cardiac Doppler and Color Doppler  Indications:     Dyspnea  History:         Patient has prior history of Echocardiogram examinations, most                  recent 01/23/2021. CHF, CAD, COPD, Arrythmias:Atrial                  Fibrillation, Signs/Symptoms:Chest Pain; Risk                  Factors:Hypertension.  Sonographer:     Wenda Low Referring Phys:  [id number] AMY N COX Diagnosing Phys: Yolonda Kida MD IMPRESSIONS  1. Left ventricular ejection fraction, by estimation, is 65 to 70%. The left ventricle has normal function. The left ventricle has no regional wall motion abnormalities. The left ventricular internal cavity size was mildly dilated. Left ventricular diastolic parameters were normal.  2. Right ventricular systolic function is normal. The right ventricular size is mildly enlarged. There is moderately elevated pulmonary artery systolic pressure.  3. The mitral valve is normal in structure. Mild mitral valve regurgitation.  4. The  aortic valve is normal in structure. Aortic valve regurgitation is mild. FINDINGS  Left Ventricle: Left ventricular ejection fraction, by estimation, is 65 to 70%. The left ventricle has normal function. The left ventricle has no regional wall motion abnormalities. The left ventricular internal cavity size was mildly dilated. There is  no left ventricular hypertrophy. Left ventricular diastolic parameters were normal. Right Ventricle: The right ventricular size is mildly enlarged. No increase in right ventricular wall thickness. Right ventricular systolic function is normal. There is moderately elevated pulmonary artery systolic pressure. The tricuspid regurgitant velocity is 3.32 m/s, and with an assumed right atrial pressure of 15 mmHg, the estimated right ventricular systolic pressure is 59.1 mmHg. Left Atrium: Left atrial size was normal in size. Right Atrium: Right atrial size was normal in size. Pericardium: There is no evidence of pericardial effusion. Mitral Valve: The mitral valve is normal in  structure. Mild mitral valve regurgitation. MV peak gradient, 5.3 mmHg. The mean mitral valve gradient is 2.0 mmHg. Tricuspid Valve: The tricuspid valve is normal in structure. Tricuspid valve regurgitation is mild. Aortic Valve: The aortic valve is normal in structure. Aortic valve regurgitation is mild. Aortic regurgitation PHT measures 585 msec. Aortic valve mean gradient measures 6.0 mmHg. Aortic valve peak gradient measures 11.2 mmHg. Aortic valve area, by VTI measures 2.24 cm. Pulmonic Valve: The pulmonic valve was normal in structure. Pulmonic valve regurgitation is not visualized. Aorta: The ascending aorta was not well visualized. IAS/Shunts: No atrial level shunt detected by color flow Doppler.  LEFT VENTRICLE PLAX 2D LVIDd:         4.80 cm   Diastology LVIDs:         3.10 cm   LV e' medial:    9.68 cm/s LV PW:         1.10 cm   LV E/e' medial:  9.5 LV IVS:        1.00 cm   LV e' lateral:   9.79 cm/s LVOT diam:     2.00 cm   LV E/e' lateral: 9.4 LV SV:         94 LV SV Index:   57 LVOT Area:     3.14 cm  RIGHT VENTRICLE RV Basal diam:  4.45 cm RV Mid diam:    4.40 cm RV S prime:     15.60 cm/s TAPSE (M-mode): 3.3 cm LEFT ATRIUM              Index        RIGHT ATRIUM           Index LA diam:        3.70 cm  2.23 cm/m   RA Area:     26.70 cm LA Vol (A2C):   135.0 ml 81.35 ml/m  RA Volume:   84.60 ml  50.98 ml/m LA Vol (A4C):   118.0 ml 71.11 ml/m LA Biplane Vol: 131.0 ml 78.94 ml/m  AORTIC VALVE                     PULMONIC VALVE AV Area (Vmax):    2.41 cm      PV Vmax:       1.05 m/s AV Area (Vmean):   2.46 cm      PV Peak grad:  4.4 mmHg AV Area (VTI):     2.24 cm AV Vmax:           167.00 cm/s AV Vmean:  112.000 cm/s AV VTI:            0.419 m AV Peak Grad:      11.2 mmHg AV Mean Grad:      6.0 mmHg LVOT Vmax:         128.00 cm/s LVOT Vmean:        87.800 cm/s LVOT VTI:          0.299 m LVOT/AV VTI ratio: 0.71 AI PHT:            585 msec  AORTA Ao Root diam: 3.60 cm Ao Asc diam:  3.30  cm MITRAL VALVE               TRICUSPID VALVE MV Area (PHT): 4.17 cm    TR Peak grad:   44.1 mmHg MV Area VTI:   3.00 cm    TR Vmax:        332.00 cm/s MV Peak grad:  5.3 mmHg MV Mean grad:  2.0 mmHg    SHUNTS MV Vmax:       1.15 m/s    Systemic VTI:  0.30 m MV Vmean:      56.4 cm/s   Systemic Diam: 2.00 cm MV Decel Time: 182 msec MV E velocity: 91.60 cm/s MV A velocity: 74.90 cm/s MV E/A ratio:  1.22 Dwayne D Callwood MD Electronically signed by Alwyn Pea MD Signature Date/Time: 08/22/2022/11:35:54 AM    Final    DG Chest 2 View  Result Date: 08/21/2022 CLINICAL DATA:  Chest pain, shortness of breath with exertion, peripheral swelling, urinary retention, 20 pound weight gain EXAM: CHEST - 2 VIEW COMPARISON:  07/11/2021 FINDINGS: Upper normal size of cardiac silhouette. Mediastinal contours and pulmonary vascularity normal. Atherosclerotic calcification aorta. Mild biapical scarring. Lungs otherwise clear. No acute infiltrate or pulmonary edema. No pleural effusion or pneumothorax. Bones demineralized with RIGHT shoulder prosthesis and age-indeterminate compression deformity of a lower thoracic vertebra. IMPRESSION: Biapical scarring. No acute abnormalities. Aortic Atherosclerosis (ICD10-I70.0). Electronically Signed   By: Ulyses Southward M.D.   On: 08/21/2022 09:02    Assessment and Plan Caleb Khan is a 89 y.o. male with a history of CHF, spinal stenosis who presents with complaints of lower extremity swelling, shortness of breath with exertion and possible urinary retention.  Patient reports he has started taking his Lasix daily as opposed to every other day to try to treat his fluid but it has not helped.  BNP is 477.3  Weight gain of about 20 lbs Sodium 121  Hyponatremia secondary to volume overloaded with congestive heart failure acute on chronic diastolic CKD stage IIIb -- patient came in with increased weight gain of about 20 pounds, increase leg swelling, elevated BNP of 477, increase  shortness of breath and intermittent hypoxia. -- Patient received IV fluids in the ER. -- Discontinued IV fluids and start patient on IV Lasix 20 mg BID. Monitor input output. -- Monitor sodium. -- Patient patient on oxygen. Do overnight pulse oximetry. Patient does not wear oxygen chronically -- follows with [facility] clinic cardiology -- patient follows with nephrology Dr. Thedore Mins as outpatient  Decreased urine output history of BPH -- patient making good urine currently. -- Continue Flomax  Paroxysmal a fib -- continue eliquis and amiodarone  History of polymyalgia rheumatica -- continue prednisone  Back pain acute on chronic lower -- will get MRI lumbar spine without contrast. Patient is supposed to see Dr. Rosita Kea as outpatient -- PT OT to see patient  Hypertension --  resume home meds  CAD -- no chest pain. Continue home meds   Procedures:none Family communication :wife in the ER Consults :none CODE STATUS: DNR DVT Prophylaxis :eliquis Level of care: Telemetry Cardiac Status is: Inpatient Remains inpatient appropriate because: management of CHF Anticipate d/c 1-2 days    TOTAL TIME TAKING CARE OF THIS PATIENT: 35 minutes.  >50% time spent on counselling and coordination of care  Note: This dictation was prepared with Dragon dictation along with smaller phrase technology. Any transcriptional errors that result from this process are unintentional.  Fritzi Mandes M.D    [facility]   CC: Primary care physician; Dion Body, MD

## 2022-08-23 DIAGNOSIS — I5033 Acute on chronic diastolic (congestive) heart failure: Secondary | ICD-10-CM | POA: Diagnosis not present

## 2022-08-23 DIAGNOSIS — I10 Essential (primary) hypertension: Secondary | ICD-10-CM | POA: Diagnosis not present

## 2022-08-23 DIAGNOSIS — E871 Hypo-osmolality and hyponatremia: Secondary | ICD-10-CM | POA: Diagnosis not present

## 2022-08-23 DIAGNOSIS — N1832 Chronic kidney disease, stage 3b: Secondary | ICD-10-CM | POA: Diagnosis not present

## 2022-08-23 NOTE — Plan of Care (Signed)
  Problem: Activity: ?Goal: Risk for activity intolerance will decrease ?Outcome: Progressing ?  ?Problem: Nutrition: ?Goal: Adequate nutrition will be maintained ?Outcome: Progressing ?  ?Problem: Elimination: ?Goal: Will not experience complications related to urinary retention ?Outcome: Progressing ?  ?Problem: Pain Managment: ?Goal: General experience of comfort will improve ?Outcome: Progressing ?  ?

## 2022-08-23 NOTE — TOC Initial Note (Addendum)
Transition of Care [facility]) - Initial/Assessment Note    Patient Details  Name: Caleb Khan MRN: [id number] Date of Birth: 02-11-1928  Transition of [facility]) CM/SW Contact:    Magnus Ivan, LCSW Phone Number: 08/23/2022, 3:37 PM  Clinical Narrative:                 Patient to DC home tomorrow, needs home o2 per MD. PT/OT rec HH and RW.  Spoke with patient. Patient lives at [place] with his wife who will provide transportation.  PCP is Dr. Netty Starring. Pharmacy is [place] or mail order. Patient has grab bars, shower seat, walker, rollator, and cane at home.  Patient is agreeable to o2 being ordered - referral made to [facility] with [employer] for delivery tomorrow morning. RN to complete sat note.  Patient states he prefers to do OPPT at [facility] instead of [facility] if possible. CSW called and spoke to Seth Bake at [facility] who confirmed this is an option. Per Seth Bake, [facility] order needs to include comment that OPPT at [facility]. She requested order be faxed to her at [phone] and stated she will notify their Therapy team. [place] faxed order.  Expected Discharge Plan: [place] Barriers to Discharge: Continued Medical Work up   Patient Goals and CMS Choice Patient states their goals for this hospitalization and ongoing recovery are:: return to [place], would like to use their OPPT rehab gym CMS Medicare.gov Compare Post Acute Care list provided to:: Patient Choice offered to / list presented to : Patient [place] ownership interest in [facility].provided to:: Patient    Expected Discharge Plan and Services       Living arrangements for the past 2 months: [place]                 DME Arranged: Walker rolling, Oxygen DME Agency: [employer] Date DME Agency Contacted: 08/23/22   Representative spoke with at DME Agency: [place] Arrangements/Services Living arrangements for the  past 2 months: [place] Lives with:: Spouse Patient language and need for interpreter reviewed:: Yes Do you feel safe going back to the place where you live?: Yes      Need for Family Participation in Patient Care: Yes (Comment) Care giver support system in place?: Yes (comment) Current home services: DME Criminal Activity/Legal Involvement Pertinent to Current Situation/Hospitalization: No - Comment as needed  Activities of Daily Living Home Assistive Devices/Equipment: None ADL Screening (condition at time of admission) Patient's cognitive ability adequate to safely complete daily activities?: Yes Is the patient deaf or have difficulty hearing?: Yes Does the patient have difficulty seeing, even when wearing glasses/contacts?: No Does the patient have difficulty concentrating, remembering, or making decisions?: No Patient able to express need for assistance with ADLs?: Yes Does the patient have difficulty dressing or bathing?: No Independently performs ADLs?: Yes (appropriate for developmental age) Does the patient have difficulty walking or climbing stairs?: No Weakness of Legs: None Weakness of Arms/Hands: None  Permission Sought/Granted Permission sought to share information with : Chartered certified accountant granted to share information with : Yes, Verbal Permission Granted     Permission granted to share info w AGENCY: DME, [place]        Emotional Assessment       Orientation: : Oriented to Self, Oriented to Situation, Oriented to Place, Oriented to  Time Alcohol / Substance Use: Not Applicable Psych Involvement: No (comment)  Admission diagnosis:  Hyponatremia [E87.1] Acute exacerbation of CHF (congestive heart failure) (HCC) [I50.9] Acute on chronic diastolic CHF (congestive heart failure) (HCC) [I50.33] Acute on chronic congestive heart failure, unspecified heart failure type [facility]) [I50.9] Patient Active Problem List    Diagnosis Date Noted   Acute on chronic diastolic CHF (congestive heart failure) ([place]) 08/22/2022   Acute exacerbation of CHF (congestive heart failure) ([place]) 08/21/2022   Paroxysmal A-fib ([place]) 08/21/2022   Hyponatremia 08/21/2022   Acute on chronic urinary retention 08/21/2022   Femoral neck fracture ([place]) 07/07/2021   Spinal stenosis    Fall    Chest pain 01/22/2021   CKD (chronic kidney disease), stage IIIb 01/22/2021   COPD (chronic obstructive pulmonary disease) ([place])    Coronary artery disease    Hypertension    Atrial fibrillation with RVR ([place])    Polymyalgia rheumatica ([place])    Sepsis ([place]) 03/17/2018   PCP:  Dion Body, MD Pharmacy:   [id number] - [place], [place] [address] [place] [address] Phone: [phone] Fax: [phone]     Social Determinants of Health (SDOH) Social History: SDOH Screenings   Food Insecurity: No Food Insecurity (08/22/2022)  Housing: Low Risk  (08/22/2022)  Transportation Needs: No Transportation Needs (08/22/2022)  Utilities: Not At Risk (08/22/2022)  Tobacco Use: Medium Risk (08/21/2022)   SDOH Interventions:     Readmission Risk Interventions     No data to display

## 2022-08-23 NOTE — Evaluation (Signed)
Occupational Therapy Evaluation Patient Details Name: Caleb Khan MRN: [id number] DOB: 1928/07/20 Today's Date: 08/23/2022   History of Present Illness Pt is a 89 year old male resents to the emergency department for chief concerns of shortness of breath with exertion and worsening bilateral lower extremity swelling along with urinary retention and weight gain, admitted with hyponatremia; PMH significant for heart failure preserved ejection fraction, aortic atherosclerosis, hypertension, hyperlipidemia, insomnia, centrilobar emphysema, CKD 3B, urinary incontinence, bilateral lower extremity edema, paroxysmal atrial fibrillation, on Eliquis, CAD, polymyalgia rheumatica syndrome   Clinical Impression   Chart reviewed, nurse cleared pt for participation in OT evaluation. Pt is alert and oriented x4, agreeable to evaluation. PTA pt is generally MOD I in ADL/IADL however recently requiring assist for LB dressing, increased assist for IADLs in the past two weeks, amb with prn use of AD however frequent use of RW the last two weeks. Pt presents with deficits in strength, endurance, activity tolerance all affecting safe and optimal ADL completion. Pt is performing ADL mobility with supervision with RW approx 150', grooming at sink level with RW with supervision, MAX A for LB dressing. Discussed use of AE with pt for LB dressing- pt reports he has a Sports administrator and sock aid, wife is a retired Pharmacist, community. Educated on alternate methods of donning compression socks. Pt is left in bedside chair, all needs met. OT will follow acutely, no OT needs identified following discharge.      Recommendations for follow up therapy are one component of a multi-disciplinary discharge planning process, led by the attending physician.  Recommendations may be updated based on patient status, additional functional criteria and insurance authorization.   Follow Up Recommendations  No OT follow up     Assistance Recommended at Discharge  Intermittent Supervision/Assistance  Patient can return home with the following A little help with bathing/dressing/bathroom;Assistance with cooking/housework    Functional Status Assessment  Patient has had a recent decline in their functional status and demonstrates the ability to make significant improvements in function in a reasonable and predictable amount of time.  Equipment Recommendations  None recommended by OT    Recommendations for Other Services       Precautions / Restrictions Precautions Precautions: Fall Restrictions Weight Bearing Restrictions: No      Mobility Bed Mobility               General bed mobility comments: NT in chair pre/post session    Transfers Overall transfer level: Needs assistance Equipment used: Rolling walker (2 wheels) Transfers: Sit to/from Stand Sit to Stand: Min guard                  Balance Overall balance assessment: Needs assistance Sitting-balance support: No upper extremity supported, Feet supported Sitting balance-Leahy Scale: Normal     Standing balance support: Bilateral upper extremity supported, During functional activity, Reliant on assistive device for balance Standing balance-Leahy Scale: Fair                             ADL either performed or assessed with clinical judgement   ADL Overall ADL's : Needs assistance/impaired Eating/Feeding: Set up;Sitting   Grooming: Wash/dry hands;Standing;Supervision/safety               Lower Body Dressing: Maximal assistance   Toilet Transfer: Supervision/safety;Rolling walker (2 wheels);Min Engineering geologist Details (indicate cue type and reason): intermittent vcs for technique, use of grab bars Toileting- Clothing Manipulation  and Hygiene: Supervision/safety;Min guard;Sit to/from stand       Functional mobility during ADLs: Supervision/safety;Min guard;Rolling walker (2 wheels) (approx 150' with RW)       Vision  Patient Visual Report: No change from baseline       Perception     Praxis      Pertinent Vitals/Pain Pain Assessment Pain Assessment: 0-10 Pain Score: 2  Pain Location: lower back, L side Pain Descriptors / Indicators: Aching Pain Intervention(s): Limited activity within patient's tolerance, Monitored during session, Repositioned     Hand Dominance     Extremity/Trunk Assessment Upper Extremity Assessment Upper Extremity Assessment: Overall WFL for tasks assessed;LUE deficits/detail;RUE deficits/detail RUE Deficits / Details: arthritic changes notedin DIP joints D 1-4; arthritic changes to R shoulder with pt able to flex to 90 degrees, all other AROM WFL LUE Deficits / Details: arthritic changes notedin DIP joints D 1-4   Lower Extremity Assessment Lower Extremity Assessment: Generalized weakness RLE Deficits / Details: hip flexion, knee flexion/extension, and DF 4+/5 LLE Deficits / Details: hip flexion 2+/5 (pt reports chronic weakness form spinal stenosis); knee flexion/extension and DF 4+/5   Cervical / Trunk Assessment Cervical / Trunk Assessment: Normal   Communication Communication Communication: HOH (Pt utilized B hearing aides during session)   Cognition Arousal/Alertness: Awake/alert Behavior During Therapy: WFL for tasks assessed/performed Overall Cognitive Status: Within Functional Limits for tasks assessed                                       General Comments  vital signs montiored, appear stable throughout. Spo2 >88% on RA throughout    Exercises Other Exercises Other Exercises: edu pt re: discharge recommendations, alternate donning methods for compression socks (plastic bag trick), DME use;   Shoulder Instructions      Home Living Family/patient expects to be discharged to:: Private residence [facility]) Living Arrangements: Spouse/significant other Available Help at Discharge: Family;Available PRN/intermittently Type of Home:  House Home Access: Level entry     Home Layout: One level     Bathroom Shower/Tub: Occupational psychologist: Handicapped height     Home Equipment: Grab bars - tub/shower;Shower seat - built in;Shower Land (2 wheels);Rollator (4 wheels);Cane - single point;Grab bars - toilet;Adaptive equipment Adaptive Equipment: Reacher;Sock aid Additional Comments: Pt reports his wife is a retired Secretary/administrator.  Lives at [facility].      Prior Functioning/Environment Prior Level of Function : Independent/Modified Independent             Mobility Comments: amb with AD approx 2 weeks prior to admission due to decreased endurance and strength ADLs Comments: generally MOD I with ADLs, wife assists with compression sock donning the past few weeks; wife assists with IADLs as needed however pt helps with meal prep, unloading dishwasher etc        OT Problem List: Decreased activity tolerance;Decreased knowledge of use of DME or AE      OT Treatment/Interventions: Self-care/ADL training;Patient/family education;Therapeutic activities;Therapeutic exercise;DME and/or AE instruction    OT Goals(Current goals can be found in the care plan section) Acute Rehab OT Goals Patient Stated Goal: get stronger OT Goal Formulation: With patient/family Time For Goal Achievement: 09/06/22 Potential to Achieve Goals: Good ADL Goals Pt Will Perform Grooming: with modified independence;standing Pt Will Perform Lower Body Dressing: with modified independence;sit to/from stand;with adaptive equipment Pt Will Transfer to Toilet: with  modified independence;ambulating Pt Will Perform Toileting - Clothing Manipulation and hygiene: with modified independence;sit to/from stand  OT Frequency: Min 2X/week    Co-evaluation              AM-PAC OT "6 Clicks" Daily Activity     Outcome Measure Help from another person eating meals?: None Help from another person taking care of personal grooming?:  None Help from another person toileting, which includes using toliet, bedpan, or urinal?: None Help from another person bathing (including washing, rinsing, drying)?: A Little Help from another person to put on and taking off regular upper body clothing?: None Help from another person to put on and taking off regular lower body clothing?: A Lot 6 Click Score: 21   End of Session Equipment Utilized During Treatment: Rolling walker (2 wheels) Nurse Communication: Mobility status  Activity Tolerance: Patient tolerated treatment well Patient left: in chair;with call bell/phone within reach;with chair alarm set;with family/visitor present  OT Visit Diagnosis: Unsteadiness on feet (R26.81)                Time: 1610-9604 OT Time Calculation (min): 27 min Charges:  OT General Charges $OT Visit: 1 Visit OT Evaluation $OT Eval Low Complexity: 1 Low Shanon Payor, OTD OTR/L  08/23/22, 1:25 PM

## 2022-08-23 NOTE — Progress Notes (Addendum)
[facility]  - [place] [facility]   PATIENT NAME: Caleb Khan    MR#:  [id number]  DATE OF BIRTH:  April 02, 1928  SUBJECTIVE:  wife at bedside. Patient came in from PCP's office with increased weight gain, leg edema, shortness of breath, found to be in congestive heart failure however received IV fluids per admitting physician. Patient remains short winded while completing sentence. Sats drop intermittently into the upper 80s. No chest pain. Has complained of back pain due to his lumbar spinal stenosis.  Overall feels better. Out in the chair Oxygen sats dropped to 80's with ambulation.    VITALS:  Blood pressure 123/60, pulse 100, temperature 97.6 F (36.4 C), resp. rate 17, height 5\' 3"  (1.6 m), weight 71.9 kg, SpO2 94 %.  PHYSICAL EXAMINATION:   GENERAL:  89 y.o.-year-old patient with mild  acute distress.  LUNGS: Normal breath sounds bilaterally, no wheezing.few rales CARDIOVASCULAR: S1, S2 normal. No murmur   ABDOMEN: Soft, nontender, nondistended. Bowel sounds present.  EXTREMITIES:bilateral + pitting edema NEUROLOGIC: nonfocal  patient is alert and awake SKIN: per RN  LABORATORY PANEL:  CBC Recent Labs  Lab 08/22/22 0446  WBC 7.1  HGB 10.6*  HCT 30.7*  PLT 278     Chemistries  Recent Labs  Lab 08/22/22 0446  NA 125*  K 4.0  CL 94*  CO2 22  GLUCOSE 83  BUN 34*  CREATININE 1.54*  CALCIUM 8.3*  AST 45*  ALT 25  ALKPHOS 46  BILITOT 1.0    Cardiac Enzymes No results for input(s): "TROPONINI" in the last 168 hours. RADIOLOGY:  MR LUMBAR SPINE WO CONTRAST  Result Date: 08/22/2022 CLINICAL DATA:  Initial evaluation for low back pain. EXAM: MRI LUMBAR SPINE WITHOUT CONTRAST TECHNIQUE: Multiplanar, multisequence MR imaging of the lumbar spine was performed. No intravenous contrast was administered. COMPARISON:  Prior MRI from 03/02/2018. FINDINGS: Segmentation: Transitional lumbosacral anatomy. S1 segment is labeled the transitional  segment. Alignment: Mild dextroscoliosis, apex at L3-4. 2 mm retrolisthesis of L1 on L2 and L2 on L3, with trace anterolisthesis of L4 on L5. 5 mm anterolisthesis of L5 on S1. Vertebrae: Vertebral body height maintained without acute or chronic fracture. Bone marrow signal intensity within normal limits. No worrisome osseous lesions. No abnormal marrow edema. Conus medullaris and cauda equina: Conus extends to the L1-2 level. Conus and cauda equina appear normal. Paraspinal and other soft tissues: Paraspinous soft tissues demonstrate no acute finding. Mild asymmetric atrophy involving the right psoas muscle, likely related to right hip arthroplasty. Multiple T2 hyperintense cyst noted about the visualized kidneys, benign in appearance, and similar to prior, no follow-up imaging recommended. Disc levels: Degree of underlying congenital spinal stenosis noted. T12-L1: Degenerative intervertebral disc space narrowing with diffuse disc bulge and disc desiccation. Right paracentral disc protrusion with slight superior migration indents the ventral thecal sac. No significant spinal stenosis. Mild right foraminal narrowing. Left neural foramen remains patent. L1-2: Advanced degenerative intervertebral disc space narrowing with diffuse disc bulge and reactive endplate spurring. Mild facet hypertrophy. No significant spinal stenosis. Mild bilateral L1 foraminal narrowing. L2-3: Disc bulge with mild endplate spurring. Moderate bilateral facet hypertrophy. Resultant mild left lateral recess stenosis. Central canal remains patent. Mild right with mild-to-moderate left L2 foraminal stenosis. L3-4: Diffuse disc bulge. Moderate facet and ligament flavum hypertrophy. Resultant severe spinal stenosis, similar to prior. Severe bilateral L3 foraminal narrowing. L4-5: Diffuse disc bulge. Superimposed left subarticular disc protrusion indents the left ventral thecal sac (series 8, image 26).  Moderate facet hypertrophy. Resultant moderate  to severe canal with left worse than right lateral recess stenosis. Moderate right worse than left L4 foraminal narrowing. L5-S1: Anterolisthesis. Diffuse disc bulge, asymmetric to the right. Severe bilateral facet arthrosis. Resultant severe spinal stenosis with near complete effacement of the thecal sac, similar to prior. Severe right with mild-to-moderate left L5 foraminal narrowing. S1-2: Disc desiccation without significant disc bulge. Right worse than left facet hypertrophy. No spinal stenosis. Foramina remain patent. IMPRESSION: 1. No acute fracture or other abnormality within the lumbar spine. 2. Multilevel lumbar spondylosis with resultant severe spinal stenosis at L3-4 through L5-S1, similar to prior. 3. Multifactorial degenerative changes with resultant multilevel foraminal narrowing as above. Notable findings include severe bilateral L3 foraminal stenosis, moderate right worse than left L4 foraminal narrowing, with severe right L5 foraminal stenosis. 4. Transitional lumbosacral anatomy. Careful correlation with numbering system on this exam recommended prior to any potential future intervention. Electronically Signed   By: Jeannine Boga M.D.   On: 08/22/2022 20:42   ECHOCARDIOGRAM COMPLETE  Result Date: 08/22/2022    ECHOCARDIOGRAM REPORT   Patient Name:   Caleb Khan Date of Exam: 08/21/2022 Medical Rec #:  [id number]     Height:       63.0 in Accession #:    [id number]    Weight:       139.6 lb Date of Birth:  1928/05/25     BSA:          1.659 m Patient Age:    89 years      BP:           151/63 mmHg Patient Gender: M             HR:           69 bpm. Exam Location:  [facility] Procedure: 2D Echo, Cardiac Doppler and Color Doppler Indications:     Dyspnea  History:         Patient has prior history of Echocardiogram examinations, most                  recent 01/23/2021. CHF, CAD, COPD, Arrythmias:Atrial                  Fibrillation, Signs/Symptoms:Chest Pain; Risk                   Factors:Hypertension.  Sonographer:     Wenda Low Referring Phys:  [id number] AMY N COX Diagnosing Phys: Yolonda Kida MD IMPRESSIONS  1. Left ventricular ejection fraction, by estimation, is 65 to 70%. The left ventricle has normal function. The left ventricle has no regional wall motion abnormalities. The left ventricular internal cavity size was mildly dilated. Left ventricular diastolic parameters were normal.  2. Right ventricular systolic function is normal. The right ventricular size is mildly enlarged. There is moderately elevated pulmonary artery systolic pressure.  3. The mitral valve is normal in structure. Mild mitral valve regurgitation.  4. The aortic valve is normal in structure. Aortic valve regurgitation is mild. FINDINGS  Left Ventricle: Left ventricular ejection fraction, by estimation, is 65 to 70%. The left ventricle has normal function. The left ventricle has no regional wall motion abnormalities. The left ventricular internal cavity size was mildly dilated. There is  no left ventricular hypertrophy. Left ventricular diastolic parameters were normal. Right Ventricle: The right ventricular size is mildly enlarged. No increase in right ventricular wall thickness. Right ventricular systolic function is normal. There is moderately elevated pulmonary  artery systolic pressure. The tricuspid regurgitant velocity is 3.32 m/s, and with an assumed right atrial pressure of 15 mmHg, the estimated right ventricular systolic pressure is 66.4 mmHg. Left Atrium: Left atrial size was normal in size. Right Atrium: Right atrial size was normal in size. Pericardium: There is no evidence of pericardial effusion. Mitral Valve: The mitral valve is normal in structure. Mild mitral valve regurgitation. MV peak gradient, 5.3 mmHg. The mean mitral valve gradient is 2.0 mmHg. Tricuspid Valve: The tricuspid valve is normal in structure. Tricuspid valve regurgitation is mild. Aortic Valve: The aortic valve is normal  in structure. Aortic valve regurgitation is mild. Aortic regurgitation PHT measures 585 msec. Aortic valve mean gradient measures 6.0 mmHg. Aortic valve peak gradient measures 11.2 mmHg. Aortic valve area, by VTI measures 2.24 cm. Pulmonic Valve: The pulmonic valve was normal in structure. Pulmonic valve regurgitation is not visualized. Aorta: The ascending aorta was not well visualized. IAS/Shunts: No atrial level shunt detected by color flow Doppler.  LEFT VENTRICLE PLAX 2D LVIDd:         4.80 cm   Diastology LVIDs:         3.10 cm   LV e' medial:    9.68 cm/s LV PW:         1.10 cm   LV E/e' medial:  9.5 LV IVS:        1.00 cm   LV e' lateral:   9.79 cm/s LVOT diam:     2.00 cm   LV E/e' lateral: 9.4 LV SV:         94 LV SV Index:   57 LVOT Area:     3.14 cm  RIGHT VENTRICLE RV Basal diam:  4.45 cm RV Mid diam:    4.40 cm RV S prime:     15.60 cm/s TAPSE (M-mode): 3.3 cm LEFT ATRIUM              Index        RIGHT ATRIUM           Index LA diam:        3.70 cm  2.23 cm/m   RA Area:     26.70 cm LA Vol (A2C):   135.0 ml 81.35 ml/m  RA Volume:   84.60 ml  50.98 ml/m LA Vol (A4C):   118.0 ml 71.11 ml/m LA Biplane Vol: 131.0 ml 78.94 ml/m  AORTIC VALVE                     PULMONIC VALVE AV Area (Vmax):    2.41 cm      PV Vmax:       1.05 m/s AV Area (Vmean):   2.46 cm      PV Peak grad:  4.4 mmHg AV Area (VTI):     2.24 cm AV Vmax:           167.00 cm/s AV Vmean:          112.000 cm/s AV VTI:            0.419 m AV Peak Grad:      11.2 mmHg AV Mean Grad:      6.0 mmHg LVOT Vmax:         128.00 cm/s LVOT Vmean:        87.800 cm/s LVOT VTI:          0.299 m LVOT/AV VTI ratio: 0.71 AI PHT:  585 msec  AORTA Ao Root diam: 3.60 cm Ao Asc diam:  3.30 cm MITRAL VALVE               TRICUSPID VALVE MV Area (PHT): 4.17 cm    TR Peak grad:   44.1 mmHg MV Area VTI:   3.00 cm    TR Vmax:        332.00 cm/s MV Peak grad:  5.3 mmHg MV Mean grad:  2.0 mmHg    SHUNTS MV Vmax:       1.15 m/s    Systemic VTI:  0.30  m MV Vmean:      56.4 cm/s   Systemic Diam: 2.00 cm MV Decel Time: 182 msec MV E velocity: 91.60 cm/s MV A velocity: 74.90 cm/s MV E/A ratio:  1.22 Dwayne D Callwood MD Electronically signed by Yolonda Kida MD Signature Date/Time: 08/22/2022/11:35:54 AM    Final     Assessment and Plan Emileo Semel is a 89 y.o. male with a history of CHF, spinal stenosis who presents with complaints of lower extremity swelling, shortness of breath with exertion and possible urinary retention.  Patient reports he has started taking his Lasix daily as opposed to every other day to try to treat his fluid but it has not helped.  BNP is 477.3  Weight gain of about 20 lbs Sodium 121  Hyponatremia secondary to volume overloaded with congestive heart failure acute on chronic diastolic CKD stage IIIb -- patient came in with increased weight gain of about 20 pounds, increase leg swelling, elevated BNP of 477, increase shortness of breath and intermittent hypoxia. -- Patient received IV fluids in the ER. -- Discontinued IV fluids and start patient on IV Lasix 20 mg BID. Monitor input output. -- Monitor sodium. -- overnight pulse oximetry--> desated <88% for 2.5 hours and also during ambulation. Pt will need home oxygen Patient does not wear oxygen chronically -- [facility] cardiology -- patient follows with nephrology Dr. Candiss Norse as outpatient  Decreased urine output history of BPH -- patient making good urine currently. -- Continue Flomax  Paroxysmal a fib -- continue eliquis and amiodarone  History of polymyalgia rheumatica -- continue prednisone  Back pain acute on chronic lower --  MRI lumbar spine without contrast--shows lower L4-5 severe spinal stenosis . Patient is supposed to see Dr. Rudene Christians as outpatient -- PT OT --recommends HHPT  Hypertension -- resume home meds  CAD -- no chest pain. Continue home meds     Procedures:none Family communication :left VM for wife\   Consults :none CODE STATUS: DNR DVT Prophylaxis :eliquis Level of care: Telemetry Medical Status is: Inpatient Remains inpatient appropriate because: management of CHF Anticipate d/c 1-2 days    TOTAL TIME TAKING CARE OF THIS PATIENT: 35 minutes.  >50% time spent on counselling and coordination of care  Note: This dictation was prepared with Dragon dictation along with smaller phrase technology. Any transcriptional errors that result from this process are unintentional.  Fritzi Mandes M.D    [facility]   CC: Primary care physician; Dion Body, MD

## 2022-08-23 NOTE — Evaluation (Signed)
Physical Therapy Evaluation Patient Details Name: Caleb Khan MRN: [id number] DOB: [birth year] Today's Date: 08/23/2022  History of Present Illness  Pt is a 89 y.o. male presenting to hospital 08/21/22 (sent over by clinic) d/t LE swelling, SOB with exertion, weight gain, and possible urinary retention.  Pt admitted with hyponatremia, acute on chronic urinary retention, paroxysmal a-fib, and acute exacerbation of CHF.  PMH includes CHF, spinal stenosis, aortic atherosclerosis, htn, HLD, insomnia, centrilobar emphysema, CKD 3B, urinary incontinence, B LE edema, paroxysmal a-fib, CAD, polymyalgia rheumatic syndrome, R anterior hemi hip arthroplasty, L quadriceps/patellar tendon repair 2018.  Clinical Impression  Prior to hospital admission, pt was modified independent ambulating with 4ww; lives at [facility] with his wife.  2/10 chronic L hip pain at rest and 4/10 with ambulation (pt reports pain d/t spinal stenosis).  Currently pt is SBA supine to sitting edge of bed; min assist progressing to CGA with transfers using RW; and CGA ambulating 140 feet with RW use  (impaired gait mechanics noted d/t L LE weakness/pain).  Limited distance ambulating d/t c/o dizziness.  O2 sats 93% or greater at rest on room air but 87-88% post ambulation on room air (nurse and MD notified).  L hip flexion weakness noted (2+/5)--pt reports chronic d/t spinal stenosis.  Pt would benefit from skilled PT to address noted impairments and functional limitations (see below for any additional details).  Upon hospital discharge, pt would benefit from [place].    Recommendations for follow up therapy are one component of a multi-disciplinary discharge planning process, led by the attending physician.  Recommendations may be updated based on patient status, additional functional criteria and insurance authorization.  Follow Up Recommendations Home health PT      Assistance Recommended at Discharge Intermittent Supervision/Assistance   Patient can return home with the following  A little help with walking and/or transfers;A little help with bathing/dressing/bathroom;Assistance with cooking/housework;Assist for transportation;Help with stairs or ramp for entrance    Equipment Recommendations Rolling walker (2 wheels)  Recommendations for Other Services  OT consult    Functional Status Assessment Patient has had a recent decline in their functional status and demonstrates the ability to make significant improvements in function in a reasonable and predictable amount of time.     Precautions / Restrictions Precautions Precautions: Fall Restrictions Weight Bearing Restrictions: No      Mobility  Bed Mobility Overal bed mobility: Needs Assistance Bed Mobility: Supine to Sit     Supine to sit: Supervision     General bed mobility comments: mild increased effort/time for pt to perform on own    Transfers Overall transfer level: Needs assistance Equipment used: Rolling walker (2 wheels) Transfers: Sit to/from Stand, Bed to chair/wheelchair/BSC Sit to Stand: Min guard, Min assist   Step pivot transfers: Min guard, Min assist       General transfer comment: min assist to stand from bed and take steps bed to recliner with RW use (assist for balance initially); CGA to stand from recliner x1 trial; initial vc's for UE placement    Ambulation/Gait Ambulation/Gait assistance: Min guard Gait Distance (Feet): 140 Feet Assistive device: Rolling walker (2 wheels)   Gait velocity: decreased     General Gait Details: antalgic; decreased stance time L LE; steady with RW use; limited distance ambulating d/t pt reporting mild dizziness  Stairs            Wheelchair Mobility    Modified Rankin (Stroke Patients Only)       Balance  Overall balance assessment: Needs assistance Sitting-balance support: No upper extremity supported, Feet supported Sitting balance-Leahy Scale: Normal Sitting balance -  Comments: steady sitting reaching outside BOS   Standing balance support: Bilateral upper extremity supported, During functional activity, Reliant on assistive device for balance Standing balance-Leahy Scale: Fair Standing balance comment: initially min assist for balance but improved to CGA during standing activities                             Pertinent Vitals/Pain Pain Assessment Pain Assessment: 0-10 Pain Score: 2  (2/10 at rest; 4/10 with activity) Pain Location: L hip (chronic) Pain Descriptors / Indicators: Aching Pain Intervention(s): Limited activity within patient's tolerance, Monitored during session, Repositioned HR WFL during sessions activities.    Home Living Family/patient expects to be discharged to:: Private residence Living Arrangements: Spouse/significant other Available Help at Discharge: Family;Available PRN/intermittently (pt's wife) Type of Home: House Home Access: Level entry       Home Layout: One level Home Equipment: Grab bars - tub/shower;Shower seat - built in;Shower Land (2 wheels);Rollator (4 wheels);Cane - single point;Grab bars - toilet Additional Comments: Pt reports his wife is a retired Secretary/administrator.  [facility] at [facility].    Prior Function Prior Level of Function : Independent/Modified Independent             Mobility Comments: Modified independent ambulating with rollator recently (occasional use of cane prior to that).  No recent falls.       Hand Dominance        Extremity/Trunk Assessment   Upper Extremity Assessment Upper Extremity Assessment: Defer to OT evaluation;Overall WFL for tasks assessed    Lower Extremity Assessment Lower Extremity Assessment: RLE deficits/detail;LLE deficits/detail RLE Deficits / Details: hip flexion, knee flexion/extension, and DF 4+/5 LLE Deficits / Details: hip flexion 2+/5 (pt reports chronic weakness form spinal stenosis); knee flexion/extension and DF 4+/5    Cervical /  Trunk Assessment Cervical / Trunk Assessment: Normal  Communication   Communication: HOH (Pt utilized B hearing aides during session)  Cognition Arousal/Alertness: Awake/alert Behavior During Therapy: WFL for tasks assessed/performed Overall Cognitive Status: Within Functional Limits for tasks assessed                                          General Comments  Nursing cleared pt for participation in physical therapy.  Pt agreeable to PT session.    Exercises     Assessment/Plan    PT Assessment Patient needs continued PT services  PT Problem List Decreased strength;Decreased activity tolerance;Decreased balance;Decreased mobility;Cardiopulmonary status limiting activity;Pain       PT Treatment Interventions DME instruction;Gait training;Functional mobility training;Therapeutic activities;Therapeutic exercise;Balance training;Patient/family education    PT Goals (Current goals can be found in the Care Plan section)  Acute Rehab PT Goals Patient Stated Goal: to improve strength and mobility (and go home) PT Goal Formulation: With patient Time For Goal Achievement: 09/06/22 Potential to Achieve Goals: Good    Frequency Min 2X/week     Co-evaluation               AM-PAC PT "6 Clicks" Mobility  Outcome Measure Help needed turning from your back to your side while in a flat bed without using bedrails?: None Help needed moving from lying on your back to sitting on the side of a flat  bed without using bedrails?: None Help needed moving to and from a bed to a chair (including a wheelchair)?: A Little Help needed standing up from a chair using your arms (e.g., wheelchair or bedside chair)?: A Little Help needed to walk in hospital room?: A Little Help needed climbing 3-5 steps with a railing? : A Little 6 Click Score: 20    End of Session Equipment Utilized During Treatment: Gait belt Activity Tolerance: Other (comment) (limited ambulation distance d/t  dizziness (nurse and MD notified)) Patient left: in chair;with call bell/phone within reach;with chair alarm set Nurse Communication: Mobility status;Precautions;Other (comment) (pt's dizziness and O2 sats during session) PT Visit Diagnosis: Unsteadiness on feet (R26.81);Muscle weakness (generalized) (M62.81);Pain Pain - Right/Left: Left Pain - part of body: Hip    Time: 5462-7035 PT Time Calculation (min) (ACUTE ONLY): 29 min   Charges:   PT Evaluation $PT Eval Low Complexity: 1 Low PT Treatments $Therapeutic Activity: 8-22 mins       Hendricks Limes, PT 08/23/22, 10:04 AM

## 2022-08-23 NOTE — Progress Notes (Signed)
Attempted calls into patient's room to discuss PT/OT recs for [facility], home o2 needs. Unable to reach. Asked RN to request patient call CSW when able.  Oleh Genin, [place]

## 2022-08-24 DIAGNOSIS — I5033 Acute on chronic diastolic (congestive) heart failure: Secondary | ICD-10-CM | POA: Diagnosis not present

## 2022-08-24 DIAGNOSIS — E871 Hypo-osmolality and hyponatremia: Secondary | ICD-10-CM | POA: Diagnosis not present

## 2022-08-24 DIAGNOSIS — I10 Essential (primary) hypertension: Secondary | ICD-10-CM | POA: Diagnosis not present

## 2022-08-24 DIAGNOSIS — N1832 Chronic kidney disease, stage 3b: Secondary | ICD-10-CM | POA: Diagnosis not present

## 2022-08-24 LAB — BASIC METABOLIC PANEL
Anion gap: 12 (ref 5–15)
BUN: 44 mg/dL — ABNORMAL HIGH (ref 8–23)
CO2: 24 mmol/L (ref 22–32)
Calcium: 8.3 mg/dL — ABNORMAL LOW (ref 8.9–10.3)
Chloride: 95 mmol/L — ABNORMAL LOW (ref 98–111)
Creatinine, Ser: 1.67 mg/dL — ABNORMAL HIGH (ref 0.61–1.24)
GFR, Estimated: 38 mL/min — ABNORMAL LOW (ref >60)
Glucose, Bld: 89 mg/dL (ref 70–99)
Potassium: 3.8 mmol/L (ref 3.5–5.1)
Sodium: 131 mmol/L — ABNORMAL LOW (ref 135–145)

## 2022-08-24 MED ORDER — FUROSEMIDE 20 MG PO TABS: 20.0000 mg | ORAL_TABLET | Freq: Every day | ORAL | 0 refills | Status: AC

## 2022-08-24 MED ORDER — FUROSEMIDE 20 MG PO TABS: 20.0000 mg | ORAL_TABLET | Freq: Two times a day (BID) | ORAL | Status: DC

## 2022-08-24 NOTE — Consult Note (Signed)
   Heart Failure Nurse Navigator Note  HFpEF 65 to 70%.  Left ventricular internal cavity is mildly dilated.  Normal right ventricular systolic function.  Right ventricle is mildly enlarged.  Moderately elevated pulmonary artery systolic pressures.  Mild mitral regurgitation.  Mild aortic insufficiency.  Mild tricuspid regurgitation.  He presented to the emergency room with complaints of shortness of breath, dyspnea on exertion, worsening lower extremity edema and weight gain of 15 to 20 pounds.  BNP 477.  Comorbidities:  Polymyalgia rheumatica Hypertension Hyperlipidemia Emphysema Chronic kidney disease stage III Paroxysmal atrial fibrillation Coronary artery disease  Medications:  Amiodarone 100 mg daily Apixaban 2.5 mg 2 times a day Losartan 50 mg 2 times a day Crestor 5 mg at bedtime To start Lasix 20 mg 2 times a day  Labs:  Sodium 125, potassium 4, chloride 94, CO2 22, BUN 34, creatinine 1.54, estimated GFR 42, albumin 3.0 Weight is 71.9 kg Intake 240 mL Output 1550 mL   Initial meeting with patient who is sitting up in the chair at bedside.  There are no family members present.  He states that he lives in independent living with his wife.  He has a scale but does not weigh himself daily.  Discussed the importance of daily weight and reporting 2 to 3 pound weight gain overnight or 5 pounds within the week.  Also discussed reporting changes in symptoms such as increased swelling, increased shortness of breath, PND and orthopnea, dry cough etc.   Went over the importance of 2000 mg sodium restriction.  Patient states he does not use salt at the table.  He also is voices concern over his low sodium level.  Explained with fluid overload that that dilutes your bodies sodium levels.  Given another good reason to keep your fluid restriction at no more 64 ounces daily.  Went over all that is included in being considered a liquid.  This includes things that melt at room temperature  such as ice cream, Jell-O, and puddings.  Discussed follow up in the outpatient  heart failure clinic for which he has an appointment on September 01, 2022 at 11:30 AM.  Was given in with heart failure teaching booklet, zone magnet, info on heart failure and low-sodium along with weight chart.  He had no further questions and is being discharged home today.  Pricilla Riffle RN [facility]

## 2022-08-24 NOTE — Discharge Instructions (Addendum)
Low Sodium Nutrition Therapy  Eating less sodium can help you if you have high blood pressure, heart failure, or kidney or liver disease.   Your body needs a little sodium, but too much sodium can cause your body to hold onto extra water. This extra water will raise your blood pressure and can cause damage to your heart, kidneys, or liver as they are forced to work harder.   Sometimes you can see how the extra fluid affects you because your hands, legs, or belly swell. You may also hold water around your heart and lungs, which makes it hard to breathe.   Even if you take medication for blood pressure or a water pill (diuretic) to remove fluid, it is still important to have less salt in your diet.   Check with your primary care provider before drinking alcohol since it may affect the amount of fluid in your body and how your heart, kidneys, or liver work. Sodium in Food A low-sodium meal plan limits the sodium that you get from food and beverages to 1,500-2,000 milligrams (mg) per day. Salt is the main source of sodium. Read the nutrition label on the package to find out how much sodium is in one serving of a food.  Select foods with 140 milligrams (mg) of sodium or less per serving.  You may be able to eat one or two servings of foods with a little more than 140 milligrams (mg) of sodium if you are closely watching how much sodium you eat in a day.  Check the serving size on the label. The amount of sodium listed on the label shows the amount in one serving of the food. So, if you eat more than one serving, you will get more sodium than the amount listed.  Tips Cutting Back on Sodium Eat more fresh foods.  Fresh fruits and vegetables are low in sodium, as well as frozen vegetables and fruits that have no added juices or sauces.  Fresh meats are lower in sodium than processed meats, such as bacon, sausage, and hotdogs.  Not all processed foods are unhealthy, but some processed foods may have too  much sodium.  Eat less salt at the table and when cooking. One of the ingredients in salt is sodium.  One teaspoon of table salt has 2,300 milligrams of sodium.  Leave the salt out of recipes for pasta, casseroles, and soups. Be a Paramedic.  Food packages that say "Salt-free", sodium-free", "very low sodium," and "low sodium" have less than 140 milligrams of sodium per serving.  Beware of products identified as "Unsalted," "No Salt Added," "Reduced Sodium," or "Lower Sodium." These items may still be high in sodium. You should always check the nutrition label. Add flavors to your food without adding sodium.  Try lemon juice, lime juice, or vinegar.  Dry or fresh herbs add flavor.  Buy a sodium-free seasoning blend or make your own at home. You can purchase salt-free or sodium-free condiments like barbeque sauce in stores and online. Ask your registered dietitian nutritionist for recommendations and where to find them.   Eating in Restaurants Choose foods carefully when you eat outside your home. Restaurant foods can be very high in sodium. Many restaurants provide nutrition facts on their menus or their websites. If you cannot find that information, ask your server. Let your server know that you want your food to be cooked without salt and that you would like your salad dressing and sauces to be served on the  side.    Foods Recommended Food Group Foods Recommended  Grains Bread, bagels, rolls without salted tops Homemade bread made with reduced-sodium baking powder Cold cereals, especially shredded wheat and puffed rice Oats, grits, or cream of wheat Pastas, quinoa, and rice Popcorn, pretzels or crackers without salt Corn tortillas  Protein Foods Fresh meats and fish; [place] bacon (check the nutrition labels - make sure they are not packaged in a sodium solution) Canned or packed tuna (no more than 4 ounces at 1 serving) Beans and peas Soybeans) and tofu Eggs Nuts or nut butters  without salt  Dairy Milk or milk powder Plant milks, such as rice and soy Yogurt, including Greek yogurt Small amounts of natural cheese (blocks of cheese) or reduced-sodium cheese can be used in moderation. (Swiss, ricotta, and fresh mozzarella cheese are lower in sodium than the others) Cream Cheese Low sodium cottage cheese  Vegetables Fresh and frozen vegetables without added sauces or salt Homemade soups (without salt) Low-sodium, salt-free or sodium-free canned vegetables and soups  Fruit Fresh and canned fruits Dried fruits, such as raisins, cranberries, and prunes  Oils Tub or liquid margarine, regular or without salt Canola, corn, peanut, olive, safflower, or sunflower oils  Condiments Fresh or dried herbs such as basil, bay leaf, dill, mustard (dry), nutmeg, paprika, parsley, rosemary, sage, or thyme.  Low sodium ketchup Vinegar  Lemon or lime juice Pepper, red pepper flakes, and cayenne. Hot sauce contains sodium, but if you use just a drop or two, it will not add up to much.  Salt-free or sodium-free seasoning mixes and marinades Simple salad dressings: vinegar and oil   Foods Not Recommended Food Group Foods Not Recommended  Grains Breads or crackers topped with salt Cereals (hot/cold) with more than 300 mg sodium per serving Biscuits, cornbread, and other "quick" breads prepared with baking soda Pre-packaged bread crumbs Seasoned and packaged rice and pasta mixes Self-rising flours  Protein Foods Cured meats: Bacon, ham, sausage, pepperoni and hot dogs Canned meats (chili, vienna sausage, or sardines) Smoked fish and meats Frozen meals that have more than 600 mg of sodium per serving Egg substitute (with added sodium)  Dairy Buttermilk Processed cheese spreads Cottage cheese (1 cup may have over 500 mg of sodium; look for low-sodium.) American or feta cheese Shredded Cheese has more sodium than blocks of cheese String cheese  Vegetables Canned vegetables  (unless they are salt-free, sodium-free or low sodium) Frozen vegetables with seasoning and sauces Sauerkraut and pickled vegetables Canned or dried soups (unless they are salt-free, sodium-free, or low sodium) [place] fries and onion rings  Fruit Dried fruits preserved with additives that have sodium  Oils Salted butter or margarine, all types of olives  Condiments Salt, sea salt, kosher salt, onion salt, and garlic salt Seasoning mixes with salt Bouillon cubes Ketchup Barbeque sauce and Worcestershire sauce unless low sodium Soy sauce Salsa, pickles, olives, relish Salad dressings: ranch, blue cheese, [place], and [place].   Low Sodium Sample 1-Day Menu  Breakfast 1 cup cooked oatmeal  1 slice whole wheat bread toast  1 tablespoon peanut butter without salt  1 banana  1 cup 1% milk  Lunch Tacos made with: 2 corn tortillas   cup black beans, low sodium   cup roasted or grilled chicken (without skin)   avocado  Squeeze of lime juice  1 cup salad greens  1 tablespoon low-sodium salad dressing   cup strawberries  1 orange  Afternoon Snack 1/3 cup grapes  6 ounces yogurt  Evening Meal 3 ounces herb-baked fish  1 baked potato  2 teaspoons olive oil   cup cooked carrots  2 thick slices tomatoes on:  2 lettuce leaves  1 teaspoon olive oil  1 teaspoon balsamic vinegar  1 cup 1% milk  Evening Snack 1 apple   cup almonds without salt   Low-Sodium Vegetarian (Lacto-Ovo) Sample 1-Day Menu  Breakfast 1 cup cooked oatmeal  1 slice whole wheat toast  1 tablespoon peanut butter without salt  1 banana  1 cup 1% milk  Lunch Tacos made with: 2 corn tortillas   cup black beans, low sodium   cup roasted or grilled chicken (without skin)   avocado  Squeeze of lime juice  1 cup salad greens  1 tablespoon low-sodium salad dressing   cup strawberries  1 orange  Evening Meal Stir fry made with:  cup tofu  1 cup brown rice   cup broccoli   cup green beans   cup  peppers   tablespoon peanut oil  1 orange  1 cup 1% milk  Evening Snack 4 strips celery  2 tablespoons hummus  1 hard-boiled egg   Low-Sodium Vegan Sample 1-Day Menu  Breakfast 1 cup cooked oatmeal  1 tablespoon peanut butter without salt  1 cup blueberries  1 cup soymilk fortified with calcium, vitamin B12, and vitamin D  Lunch 1 small whole wheat pita   cup cooked lentils  2 tablespoons hummus  4 carrot sticks  1 medium apple  1 cup soymilk fortified with calcium, vitamin B12, and vitamin D  Evening Meal Stir fry made with:  cup tofu  1 cup brown rice   cup broccoli   cup green beans   cup peppers   tablespoon peanut oil  1 cup cantaloupe  Evening Snack 1 cup soy yogurt   cup mixed nuts  Copyright 2020  [employer]. All rights reserved  Sodium Free Flavoring Tips  When cooking, the following items may be used for flavoring instead of salt or seasonings that contain sodium. Remember: A little bit of spice goes a long way! Be careful not to overseason. Spice Blend Recipe (makes about ? cup) 5 teaspoons onion powder  2 teaspoons garlic powder  2 teaspoons paprika  2 teaspoon dry mustard  1 teaspoon crushed thyme leaves   teaspoon white pepper   teaspoon celery seed Food Item Flavorings  Beef Basil, bay leaf, caraway, curry, dill, dry mustard, garlic, grape jelly, green pepper, mace, marjoram, mushrooms (fresh), nutmeg, onion or onion powder, parsley, pepper, rosemary, sage  Chicken Basil, cloves, cranberries, mace, mushrooms (fresh), nutmeg, oregano, paprika, parsley, pineapple, saffron, sage, savory, tarragon, thyme, tomato, turmeric  Egg Chervil, curry, dill, dry mustard, garlic or garlic powder, green pepper, jelly, mushrooms (fresh), nutmeg, onion powder, paprika, parsley, rosemary, tarragon, tomato  Fish Basil, bay leaf, chervil, curry, dill, dry mustard, green pepper, lemon juice, marjoram, mushrooms (fresh), paprika, pepper,  tarragon, tomato, turmeric  Lamb Cloves, curry, dill, garlic or garlic powder, mace, mint, mint jelly, onion, oregano, parsley, pineapple, rosemary, tarragon, thyme  Pork Applesauce, basil, caraway, chives, cloves, garlic or garlic powder, onion or onion powder, rosemary, thyme  Veal Apricots, basil, bay leaf, currant jelly, curry, ginger, marjoram, mushrooms (fresh), oregano, paprika  Vegetables Basil, dill, garlic or garlic powder, ginger, lemon juice, mace, marjoram, nutmeg, onion or onion powder, tarragon, tomato, sugar or sugar substitute, salt-free salad dressing, vinegar  Desserts Allspice, anise, cinnamon, cloves, ginger, mace, nutmeg, vanilla extract, other  extracts   Copyright 2020  [employer]. All rights reserved  Fluid Restricted Nutrition Therapy  You have been prescribed this diet because your condition affects how much fluid you can eat or drink. If your heart, liver, or kidneys aren't working properly, you may not be able to effectively eliminate fluids from the body and this may cause swelling (edema) in the legs, arms, and/or stomach. Drink no more than _________ liters or ________ ounces or ________cups of fluid per day.  You don't need to stop eating or drinking the same fluids you normally would, but you may need to eat or drink less than usual.  Your registered dietitian nutritionist will help you determine the correct amount of fluid to consume during the day Breakfast Include fluids taken with medications  Lunch Include fluids taken with medications  Dinner Include fluids taken with medications  Bedtime Snack Include fluids taken with medications     Tips What Are Fluids?  A fluid is anything that is liquid or anything that would melt if left at room temperature. You will need to count these foods and liquids--including any liquid used to take medication--as part of your daily fluid intake. Some examples are: Alcohol (drink only with your  doctor's permission)  Coffee, tea, and other hot beverages  Gelatin (Jell-O)  Gravy  Ice cream, sherbet, sorbet  Ice cubes, ice chips  Milk, liquid creamer  Nutritional supplements  Popsicles  Vegetable and fruit juices; fluid in canned fruit  Watermelon  Yogurt  Soft drinks, lemonade, limeade  Soups  Syrup How Do I Measure My Fluid Intake? Record your fluid intake daily.  Tip: Every day, each time you eat or drink fluids, pour water in the same amount into an empty container that can hold the same amount of fluids you are allowed daily. This may help you keep track of how much fluid you are taking in throughout the day.  To accurately keep track of how much liquid you take in, measure the size of the cups, glasses, and bowls you use. If you eat soup, measure how much of it is liquid and how much is solid (such as noodles, vegetables, meat). Conversions for Measuring Fluid Intake  Milliliters (mL) Liters (L) Ounces (oz) Cups (c)  1000 1 32 4  1200 1.2 40 5  1500 1.5 50 6 1/4  1800 1.8 60 7 1/2  2000 2 67 8 1/3  Tips to Reduce Your Thirst Chew gum or suck on hard candy.  Rinse or gargle with mouthwash. Do not swallow.  Ice chips or popsicles my help quench thirst, but this too needs to be calculated into the total restriction. Melt ice chips or cubes first to figure out how much fluid they produce (for example, experiment with melting  cup ice chips or 2 ice cubes).  Add a lemon wedge to your water.  Limit how much salt you take in. A high salt intake might make you thirstier.  Don't eat or drink all your allowed liquids at once. Space your liquids out through the day.  Use small glasses and cups and sip slowly. If allowed, take your medications with fluids you eat or drink during a meal.   Fluid-Restricted Nutrition Therapy Sample 1-Day Menu  Breakfast 1 slice wheat toast  1 tablespoon peanut butter  1/2 cup yogurt (120 milliliters)  1/2 cup blueberries  1 cup milk (240  milliliters)   Lunch 3 ounces sliced [place]  2 slices whole wheat  bread  1/2 cup lettuce for sandwich  2 slices tomato for sandwich  1 ounce reduced-fat, reduced-sodium cheese  1/2 cup fresh carrot sticks  1 banana  1 cup unsweetened tea (240 milliliters)   Evening Meal 8 ounces soup (240 milliliters)  3 ounces salmon  1/2 cup quinoa  1 cup green beans  1 cup mixed greens salad  1 tablespoon olive oil  1 cup coffee (240 milliliters)  Evening Snack 1/2 cup sliced peaches  1/2 cup frozen yogurt (120 milliliters)  1 cup water (240 milliliters)  Copyright 2020  [employer]. All rights reserved   F/U Dr [facility] in 1-2 weeks

## 2022-08-24 NOTE — Discharge Summary (Signed)
Physician Discharge Summary   Patient: Blayden Conwell MRN: [id number] DOB: Jul 02, 1928  Admit date:     08/21/2022  Discharge date: 08/24/22  Discharge Physician: Enedina Finner   PCP: Marisue Ivan, MD   Recommendations at discharge:      Discharge Diagnoses: Principal Problem:   Hyponatremia Active Problems:   CKD (chronic kidney disease), stage IIIb   COPD (chronic obstructive pulmonary disease) (HCC)   Coronary artery disease   Hypertension   Polymyalgia rheumatica (HCC)   Acute exacerbation of CHF (congestive heart failure) (HCC)   Paroxysmal A-fib (HCC)   Acute on chronic urinary retention   Acute on chronic diastolic CHF (congestive heart failure) (HCC)  Craigory Toste is a 89 y.o. male with a history of CHF, spinal stenosis who presents with complaints of lower extremity swelling, shortness of breath with exertion and possible urinary retention.  Patient reports he has started taking his Lasix daily as opposed to every other day to try to treat his fluid but it has not helped.  BNP is 477.3  Weight gain of about 20 lbs Sodium 121   Hyponatremia secondary to volume overloaded with congestive heart failure acute on chronic diastolic CKD stage IIIb -- patient came in with increased weight gain of about 20 pounds, increase leg swelling, elevated BNP of 477, increase shortness of breath and intermittent hypoxia. -- Patient received IV fluids in the ER. -- Discontinued IV fluids and start patient on IV Lasix 20 mg BID. Monitor input output. -- Monitor sodium 121-->131 -- overnight pulse oximetry--> desated <88% for 2.5 hours and also during ambulation. Pt will need home oxygen Patient does not wear oxygen chronically -- [facility]--advised to f/u in 1 week -- patient follows with nephrology Dr. Thedore Mins as outpatient. --overall well diuresed. weight trending down. Change to po lasix 20 mg qd and Chlorthalidone. Pt advised to wear oxygen as  instructed. --D/c norvasc due to leg edema   history of BPH -- Continue Flomax   Paroxysmal a fib -- continue eliquis and amiodarone   History of polymyalgia rheumatica -- continue prednisone   Back pain acute on chronic lower --  MRI lumbar spine without contrast--shows lower L4-5 severe spinal stenosis . Patient is supposed to see Dr. Rosita Kea as outpatient -- PT OT --recommends HHPT   Hypertension -- resume home meds   CAD -- no chest pain. Continue home meds      pt very eager to go home since he came in the hospital. Will d/c to home with HHPT, oxygen    Procedures:none Family communication :d/w wife on the phone  Consults :none CODE STATUS: DNR DVT Prophylaxis :eliquis    Pain control - [place] Controlled Substance Reporting System database was reviewed. and patient was instructed, not to drive, operate heavy machinery, perform activities at heights, swimming or participation in water activities or provide baby-sitting services while on Pain, Sleep and Anxiety Medications; until their outpatient Physician has advised to do so again. Also recommended to not to take more than prescribed Pain, Sleep and Anxiety Medications.    Disposition: Home health Diet recommendation:  Discharge Diet Orders (From admission, onward)     Start     Ordered   08/24/22 0000  Diet - low sodium heart healthy        08/24/22 1221           Cardiac diet DISCHARGE MEDICATION: Allergies as of 08/24/2022       Reactions   Benazepril Cough  Medication List     STOP taking these medications    amLODipine 5 MG tablet Commonly known as: NORVASC   nitroGLYCERIN 0.4 MG SL tablet Commonly known as: NITROSTAT       TAKE these medications    acetaminophen 500 MG tablet Commonly known as: TYLENOL Take 1,000 mg by mouth every 8 (eight) hours as needed (pain).   amiodarone 100 MG tablet Commonly known as: PACERONE Take 100 mg by mouth daily. What changed:  Another medication with the same name was removed. Continue taking this medication, and follow the directions you see here.   Calcium Carb-Cholecalciferol 500-400 MG-UNIT Tabs Take 1 tablet by mouth daily.   chlorthalidone 25 MG tablet Commonly known as: HYGROTON Take 0.5 tablets by mouth daily.   docusate sodium 100 MG capsule Commonly known as: COLACE Take 100 mg by mouth every evening.   Eliquis 2.5 MG Tabs tablet Generic drug: apixaban Take 2.5 mg by mouth 2 (two) times daily. What changed: Another medication with the same name was removed. Continue taking this medication, and follow the directions you see here.   furosemide 20 MG tablet Commonly known as: Lasix Take 1 tablet (20 mg total) by mouth daily. What changed: when to take this   gabapentin 100 MG capsule Commonly known as: NEURONTIN Take 100 mg by mouth daily after breakfast. What changed: Another medication with the same name was removed. Continue taking this medication, and follow the directions you see here.   HYDROcodone-acetaminophen 5-325 MG tablet Commonly known as: NORCO/VICODIN Take 1 tablet by mouth every 6 (six) hours as needed for moderate pain.   losartan 50 MG tablet Commonly known as: COZAAR Take 1 tablet by mouth 2 (two) times daily.   METAMUCIL FIBER PO Take by mouth.   MULTIVITAMIN PO Take 1 tablet by mouth daily.   predniSONE 5 MG tablet Commonly known as: DELTASONE Take 5 mg by mouth daily.   PRESERVISION AREDS 2 PO Take 2 capsules by mouth 2 (two) times daily.   rosuvastatin 5 MG tablet Commonly known as: CRESTOR Take 5 mg by mouth daily.   tamsulosin 0.4 MG Caps capsule Commonly known as: FLOMAX Take 0.4 mg by mouth daily.   zolpidem 5 MG tablet Commonly known as: AMBIEN Take 5 mg by mouth at bedtime as needed for sleep.               Durable Medical Equipment  (From admission, onward)           Start     Ordered   08/23/22 1352  For home use only DME  oxygen  Once       Question Answer Comment  Length of Need 12 Months   Mode or (Route) Nasal cannula   Liters per Minute 2   Frequency Continuous (stationary and portable oxygen unit needed)   Oxygen conserving device Yes   Oxygen delivery system Gas      08/23/22 1351              Discharge Care Instructions  (From admission, onward)           Start     Ordered   08/24/22 0000  Discharge wound care:       Comments: Foam dressing as needed   08/24/22 1221            Follow-up Information     Marisue Ivan, MD. Schedule an appointment as soon as possible for a visit in 1 week(s).  Specialty: Family Medicine Contact information: [place] [address] [phone]         Yolonda Kida, MD. Schedule an appointment as soon as possible for a visit in 1 week(s).   Specialties: Cardiology, Internal Medicine Why: f/u for CHF Contact information: [place] [address] [phone]                 [place] [employer]   08/22/22 1130 08/23/22 0500 08/24/22 0500  Weight: 76.2 kg 71.9 kg 71.9 kg     Condition at discharge: fair  The results of significant diagnostics from this hospitalization (including imaging, microbiology, ancillary and laboratory) are listed below for reference.   Imaging Studies: MR LUMBAR SPINE WO CONTRAST  Result Date: 08/22/2022 CLINICAL DATA:  Initial evaluation for low back pain. EXAM: MRI LUMBAR SPINE WITHOUT CONTRAST TECHNIQUE: Multiplanar, multisequence MR imaging of the lumbar spine was performed. No intravenous contrast was administered. COMPARISON:  Prior MRI from 03/02/2018. FINDINGS: Segmentation: Transitional lumbosacral anatomy. S1 segment is labeled the transitional segment. Alignment: Mild dextroscoliosis, apex at L3-4. 2 mm retrolisthesis of L1 on L2 and L2 on L3, with trace anterolisthesis of L4 on L5. 5 mm anterolisthesis of L5 on S1.  Vertebrae: Vertebral body height maintained without acute or chronic fracture. Bone marrow signal intensity within normal limits. No worrisome osseous lesions. No abnormal marrow edema. Conus medullaris and cauda equina: Conus extends to the L1-2 level. Conus and cauda equina appear normal. Paraspinal and other soft tissues: Paraspinous soft tissues demonstrate no acute finding. Mild asymmetric atrophy involving the right psoas muscle, likely related to right hip arthroplasty. Multiple T2 hyperintense cyst noted about the visualized kidneys, benign in appearance, and similar to prior, no follow-up imaging recommended. Disc levels: Degree of underlying congenital spinal stenosis noted. T12-L1: Degenerative intervertebral disc space narrowing with diffuse disc bulge and disc desiccation. Right paracentral disc protrusion with slight superior migration indents the ventral thecal sac. No significant spinal stenosis. Mild right foraminal narrowing. Left neural foramen remains patent. L1-2: Advanced degenerative intervertebral disc space narrowing with diffuse disc bulge and reactive endplate spurring. Mild facet hypertrophy. No significant spinal stenosis. Mild bilateral L1 foraminal narrowing. L2-3: Disc bulge with mild endplate spurring. Moderate bilateral facet hypertrophy. Resultant mild left lateral recess stenosis. Central canal remains patent. Mild right with mild-to-moderate left L2 foraminal stenosis. L3-4: Diffuse disc bulge. Moderate facet and ligament flavum hypertrophy. Resultant severe spinal stenosis, similar to prior. Severe bilateral L3 foraminal narrowing. L4-5: Diffuse disc bulge. Superimposed left subarticular disc protrusion indents the left ventral thecal sac (series 8, image 26). Moderate facet hypertrophy. Resultant moderate to severe canal with left worse than right lateral recess stenosis. Moderate right worse than left L4 foraminal narrowing. L5-S1: Anterolisthesis. Diffuse disc bulge,  asymmetric to the right. Severe bilateral facet arthrosis. Resultant severe spinal stenosis with near complete effacement of the thecal sac, similar to prior. Severe right with mild-to-moderate left L5 foraminal narrowing. S1-2: Disc desiccation without significant disc bulge. Right worse than left facet hypertrophy. No spinal stenosis. Foramina remain patent. IMPRESSION: 1. No acute fracture or other abnormality within the lumbar spine. 2. Multilevel lumbar spondylosis with resultant severe spinal stenosis at L3-4 through L5-S1, similar to prior. 3. Multifactorial degenerative changes with resultant multilevel foraminal narrowing as above. Notable findings include severe bilateral L3 foraminal stenosis, moderate right worse than left L4 foraminal narrowing, with severe right L5 foraminal stenosis. 4. Transitional lumbosacral anatomy. Careful correlation with numbering system  on this exam recommended prior to any potential future intervention. Electronically Signed   By: Jeannine Boga M.D.   On: 08/22/2022 20:42   ECHOCARDIOGRAM COMPLETE  Result Date: 08/22/2022    ECHOCARDIOGRAM REPORT   Patient Name:   JASHAN COTTEN Date of Exam: 08/21/2022 Medical Rec #:  [id number]     Height:       63.0 in Accession #:    [id number]    Weight:       139.6 lb Date of Birth:  Oct 09, 1927     BSA:          1.659 m Patient Age:    30 years      BP:           151/63 mmHg Patient Gender: M             HR:           69 bpm. Exam Location:  [facility] Procedure: 2D Echo, Cardiac Doppler and Color Doppler Indications:     Dyspnea  History:         Patient has prior history of Echocardiogram examinations, most                  recent 01/23/2021. CHF, CAD, COPD, Arrythmias:Atrial                  Fibrillation, Signs/Symptoms:Chest Pain; Risk                  Factors:Hypertension.  Sonographer:     Wenda Low Referring Phys:  [id number] AMY N COX Diagnosing Phys: Yolonda Kida MD IMPRESSIONS  1. Left ventricular ejection fraction,  by estimation, is 65 to 70%. The left ventricle has normal function. The left ventricle has no regional wall motion abnormalities. The left ventricular internal cavity size was mildly dilated. Left ventricular diastolic parameters were normal.  2. Right ventricular systolic function is normal. The right ventricular size is mildly enlarged. There is moderately elevated pulmonary artery systolic pressure.  3. The mitral valve is normal in structure. Mild mitral valve regurgitation.  4. The aortic valve is normal in structure. Aortic valve regurgitation is mild. FINDINGS  Left Ventricle: Left ventricular ejection fraction, by estimation, is 65 to 70%. The left ventricle has normal function. The left ventricle has no regional wall motion abnormalities. The left ventricular internal cavity size was mildly dilated. There is  no left ventricular hypertrophy. Left ventricular diastolic parameters were normal. Right Ventricle: The right ventricular size is mildly enlarged. No increase in right ventricular wall thickness. Right ventricular systolic function is normal. There is moderately elevated pulmonary artery systolic pressure. The tricuspid regurgitant velocity is 3.32 m/s, and with an assumed right atrial pressure of 15 mmHg, the estimated right ventricular systolic pressure is 32.9 mmHg. Left Atrium: Left atrial size was normal in size. Right Atrium: Right atrial size was normal in size. Pericardium: There is no evidence of pericardial effusion. Mitral Valve: The mitral valve is normal in structure. Mild mitral valve regurgitation. MV peak gradient, 5.3 mmHg. The mean mitral valve gradient is 2.0 mmHg. Tricuspid Valve: The tricuspid valve is normal in structure. Tricuspid valve regurgitation is mild. Aortic Valve: The aortic valve is normal in structure. Aortic valve regurgitation is mild. Aortic regurgitation PHT measures 585 msec. Aortic valve mean gradient measures 6.0 mmHg. Aortic valve peak gradient measures 11.2  mmHg. Aortic valve area, by VTI measures 2.24 cm. Pulmonic Valve: The pulmonic valve was normal in structure. Pulmonic valve regurgitation  is not visualized. Aorta: The ascending aorta was not well visualized. IAS/Shunts: No atrial level shunt detected by color flow Doppler.  LEFT VENTRICLE PLAX 2D LVIDd:         4.80 cm   Diastology LVIDs:         3.10 cm   LV e' medial:    9.68 cm/s LV PW:         1.10 cm   LV E/e' medial:  9.5 LV IVS:        1.00 cm   LV e' lateral:   9.79 cm/s LVOT diam:     2.00 cm   LV E/e' lateral: 9.4 LV SV:         94 LV SV Index:   57 LVOT Area:     3.14 cm  RIGHT VENTRICLE RV Basal diam:  4.45 cm RV Mid diam:    4.40 cm RV S prime:     15.60 cm/s TAPSE (M-mode): 3.3 cm LEFT ATRIUM              Index        RIGHT ATRIUM           Index LA diam:        3.70 cm  2.23 cm/m   RA Area:     26.70 cm LA Vol (A2C):   135.0 ml 81.35 ml/m  RA Volume:   84.60 ml  50.98 ml/m LA Vol (A4C):   118.0 ml 71.11 ml/m LA Biplane Vol: 131.0 ml 78.94 ml/m  AORTIC VALVE                     PULMONIC VALVE AV Area (Vmax):    2.41 cm      PV Vmax:       1.05 m/s AV Area (Vmean):   2.46 cm      PV Peak grad:  4.4 mmHg AV Area (VTI):     2.24 cm AV Vmax:           167.00 cm/s AV Vmean:          112.000 cm/s AV VTI:            0.419 m AV Peak Grad:      11.2 mmHg AV Mean Grad:      6.0 mmHg LVOT Vmax:         128.00 cm/s LVOT Vmean:        87.800 cm/s LVOT VTI:          0.299 m LVOT/AV VTI ratio: 0.71 AI PHT:            585 msec  AORTA Ao Root diam: 3.60 cm Ao Asc diam:  3.30 cm MITRAL VALVE               TRICUSPID VALVE MV Area (PHT): 4.17 cm    TR Peak grad:   44.1 mmHg MV Area VTI:   3.00 cm    TR Vmax:        332.00 cm/s MV Peak grad:  5.3 mmHg MV Mean grad:  2.0 mmHg    SHUNTS MV Vmax:       1.15 m/s    Systemic VTI:  0.30 m MV Vmean:      56.4 cm/s   Systemic Diam: 2.00 cm MV Decel Time: 182 msec MV E velocity: 91.60 cm/s MV A velocity: 74.90 cm/s MV E/A ratio:  1.22 Alwyn Peawayne D Callwood MD  Electronically signed by  Alwyn Pea MD Signature Date/Time: 08/22/2022/11:35:54 AM    Final    DG Chest 2 View  Result Date: 08/21/2022 CLINICAL DATA:  Chest pain, shortness of breath with exertion, peripheral swelling, urinary retention, 20 pound weight gain EXAM: CHEST - 2 VIEW COMPARISON:  07/11/2021 FINDINGS: Upper normal size of cardiac silhouette. Mediastinal contours and pulmonary vascularity normal. Atherosclerotic calcification aorta. Mild biapical scarring. Lungs otherwise clear. No acute infiltrate or pulmonary edema. No pleural effusion or pneumothorax. Bones demineralized with RIGHT shoulder prosthesis and age-indeterminate compression deformity of a lower thoracic vertebra. IMPRESSION: Biapical scarring. No acute abnormalities. Aortic Atherosclerosis (ICD10-I70.0). Electronically Signed   By: Ulyses Southward M.D.   On: 08/21/2022 09:02    Microbiology: Results for orders placed or performed during the hospital encounter of 08/21/22  Resp panel by RT-PCR (RSV, Flu A&B, Covid) Anterior Nasal Swab     Status: None   Collection Time: 08/21/22  3:02 PM   Specimen: Anterior Nasal Swab  Result Value Ref Range Status   SARS Coronavirus 2 by RT PCR NEGATIVE NEGATIVE Final    Comment: (NOTE) SARS-CoV-2 target nucleic acids are NOT DETECTED.  The SARS-CoV-2 RNA is generally detectable in upper respiratory specimens during the acute phase of infection. The lowest concentration of SARS-CoV-2 viral copies this assay can detect is 138 copies/mL. A negative result does not preclude SARS-Cov-2 infection and should not be used as the sole basis for treatment or other patient management decisions. A negative result may occur with  improper specimen collection/handling, submission of specimen other than nasopharyngeal swab, presence of viral mutation(s) within the areas targeted by this assay, and inadequate number of viral copies(<138 copies/mL). A negative result must be combined  with clinical observations, patient history, and epidemiological information. The expected result is Negative.  Fact Sheet for Patients:  BloggerCourse.com  Fact Sheet for Healthcare Providers:  SeriousBroker.it  This test is no t yet approved or cleared by the [place] FDA and  has been authorized for detection and/or diagnosis of SARS-CoV-2 by FDA under an Emergency Use Authorization (EUA). This EUA will remain  in effect (meaning this test can be used) for the duration of the COVID-19 declaration under Section 564(b)(1) of the Act, 21 U.S.C.section 360bbb-3(b)(1), unless the authorization is terminated  or revoked sooner.       Influenza A by PCR NEGATIVE NEGATIVE Final   Influenza B by PCR NEGATIVE NEGATIVE Final    Comment: (NOTE) The Xpert Xpress SARS-CoV-2/FLU/RSV plus assay is intended as an aid in the diagnosis of influenza from Nasopharyngeal swab specimens and should not be used as a sole basis for treatment. Nasal washings and aspirates are unacceptable for Xpert Xpress SARS-CoV-2/FLU/RSV testing.  Fact Sheet for Patients: BloggerCourse.com  Fact Sheet for Healthcare Providers: SeriousBroker.it  This test is not yet approved or cleared by the [place] FDA and has been authorized for detection and/or diagnosis of SARS-CoV-2 by FDA under an Emergency Use Authorization (EUA). This EUA will remain in effect (meaning this test can be used) for the duration of the COVID-19 declaration under Section 564(b)(1) of the Act, 21 U.S.C. section 360bbb-3(b)(1), unless the authorization is terminated or revoked.     Resp Syncytial Virus by PCR NEGATIVE NEGATIVE Final    Comment: (NOTE) Fact Sheet for Patients: BloggerCourse.com  Fact Sheet for Healthcare Providers: SeriousBroker.it  This test is not yet approved  or cleared by the [place] FDA and has been authorized for detection and/or diagnosis of SARS-CoV-2 by FDA  under an Emergency Use Authorization (EUA). This EUA will remain in effect (meaning this test can be used) for the duration of the COVID-19 declaration under Section 564(b)(1) of the Act, 21 U.S.C. section 360bbb-3(b)(1), unless the authorization is terminated or revoked.  Performed at [facility], [address] [place], [place] [address]     Labs: CBC: Recent Labs  Lab 08/21/22 0844 08/22/22 0446  WBC 9.1 7.1  HGB 11.5* 10.6*  HCT 33.0* 30.7*  MCV 92.4 91.6  PLT 276 278   Basic Metabolic Panel: Recent Labs  Lab 08/21/22 0844 08/21/22 1502 08/21/22 2133 08/22/22 0446 08/24/22 0221  NA 121* 121* 123* 125* 131*  K 4.8 4.8 4.3 4.0 3.8  CL 89* 88* 92* 94* 95*  CO2 20* 23 22 22 24   GLUCOSE 106* 122* 91 83 89  BUN 40* 40* 37* 34* 44*  CREATININE 1.85* 1.77* 1.66* 1.54* 1.67*  CALCIUM 8.6* 8.5* 8.2* 8.3* 8.3*   Liver Function Tests: Recent Labs  Lab 08/21/22 1502 08/21/22 2133 08/22/22 0446  AST 50* 44* 45*  ALT 27 25 25   ALKPHOS 49 45 46  BILITOT 0.9 0.9 1.0  PROT 6.0* 5.6* 5.6*  ALBUMIN 3.1* 2.8* 3.0*   CBG: No results for input(s): "GLUCAP" in the last 168 hours.  Discharge time spent: greater than 30 minutes.  Signed: Enedina FinnerSona Jaspal Pultz, MD [facility] 08/24/2022

## 2022-08-24 NOTE — Progress Notes (Signed)
Pt BP was 107/56. MD notified. Per MD hold amlodipine and given losartan.

## 2022-08-24 NOTE — Progress Notes (Signed)
Pt discharged with belongings bag, oxygen tank, oxygen extension cord, and walker. Discharge instructions provided to pt and his wife, no questions or concerns voiced at this time. Transport provided via wife.

## 2022-08-24 NOTE — Plan of Care (Signed)

## 2022-08-24 NOTE — Progress Notes (Signed)
SATURATION QUALIFICATIONS:   Patient Saturations on Room Air at Rest = 92%  Patient Saturations on Room Air while Ambulating = 87%  Patient Saturations on 2 Liters of oxygen while Ambulating = 94%  Please briefly explain why patient needs home oxygen: desaturation in oxygen when walking

## 2022-08-31 NOTE — Progress Notes (Unsigned)
   Patient ID: Caleb Khan, male    DOB: 11-02-1927, 89 y.o.   MRN: [id number]  HPI  Caleb Khan is a 89 y/o male with a history of  Echo 08/21/22 showed an EF of 65-70% along with moderately elevated PA pressure and mild Caleb.   LHC done 09/15/18 showed: Ost LAD to Prox LAD lesion is 40% stenosed. Mid LAD lesion is 25% stenosed. Ost 1st Diag lesion is 75% stenosed.  Conclusion Normal overall left ventricular function Moderate coronary disease in the proximal LAD of around 50% High-grade lesion in the medium-sized diagonal of about 75% bifurcation lesion Normal right coronary artery moderate in size Negative IFR of proximal LAD lesion 0.95 was measured  Admitted 08/21/22 due to lower extremity swelling, shortness of breath with exertion and possible urinary retention. IVF and IV lasix given.   He presents today for his initial visit with a chief complaint of   Review of Systems    Physical Exam  Assessment & Plan:  1: Chronic heart failure with preserved ejection fraction- - NYHA class  - BNP 08/21/22 was 477.3  2: HTN with CKD- - BP - saw PCP ([place]) 08/28/22 - saw nephrology ([place]) 08/03/22 - BMP 08/24/22 showed sodium 131, potassium 3.8, creatinine 1.67 & GFR 38  3: PAF- - saw cardiology Caleb Khan) 06/09/22  4: COPD- - saw pulmonology Caleb Khan) 08/12/22

## 2022-09-01 ENCOUNTER — Ambulatory Visit: Payer: Medicare PPO | Attending: Family | Admitting: Family

## 2022-09-01 ENCOUNTER — Encounter: Payer: Self-pay | Admitting: Family

## 2022-09-01 VITALS — BP 118/64 | HR 81 | Wt 152.0 lb

## 2022-09-01 DIAGNOSIS — M353 Polymyalgia rheumatica: Secondary | ICD-10-CM | POA: Diagnosis not present

## 2022-09-01 DIAGNOSIS — Z23 Encounter for immunization: Secondary | ICD-10-CM | POA: Diagnosis not present

## 2022-09-01 DIAGNOSIS — I10 Essential (primary) hypertension: Secondary | ICD-10-CM

## 2022-09-01 DIAGNOSIS — Z87891 Personal history of nicotine dependence: Secondary | ICD-10-CM | POA: Diagnosis not present

## 2022-09-01 DIAGNOSIS — I480 Paroxysmal atrial fibrillation: Secondary | ICD-10-CM | POA: Diagnosis not present

## 2022-09-01 DIAGNOSIS — I2510 Atherosclerotic heart disease of native coronary artery without angina pectoris: Secondary | ICD-10-CM | POA: Diagnosis not present

## 2022-09-01 DIAGNOSIS — I5032 Chronic diastolic (congestive) heart failure: Secondary | ICD-10-CM | POA: Diagnosis present

## 2022-09-01 DIAGNOSIS — N189 Chronic kidney disease, unspecified: Secondary | ICD-10-CM | POA: Diagnosis not present

## 2022-09-01 DIAGNOSIS — J449 Chronic obstructive pulmonary disease, unspecified: Secondary | ICD-10-CM

## 2022-09-01 DIAGNOSIS — I130 Hypertensive heart and chronic kidney disease with heart failure and stage 1 through stage 4 chronic kidney disease, or unspecified chronic kidney disease: Secondary | ICD-10-CM | POA: Diagnosis present

## 2022-09-01 NOTE — Patient Instructions (Signed)
Continue weighing daily and call for an overnight weight gain of 3 pounds or more or a weekly weight gain of more than 5 pounds. ? ? ?If you have voicemail, please make sure your mailbox is cleaned out so that we may leave a message and please make sure to listen to any voicemails.  ? ? ?

## 2022-09-02 ENCOUNTER — Encounter: Payer: Self-pay | Admitting: Family

## 2022-09-07 ENCOUNTER — Encounter: Payer: Self-pay | Admitting: Family

## 2022-09-10 ENCOUNTER — Other Ambulatory Visit
Admission: RE | Admit: 2022-09-10 | Discharge: 2022-09-10 | Disposition: A | Discharge status: Home / Self Care | Payer: Medicare PPO | Source: Ambulatory Visit | Attending: Internal Medicine | Admitting: Internal Medicine

## 2022-09-10 DIAGNOSIS — I25118 Atherosclerotic heart disease of native coronary artery with other forms of angina pectoris: Secondary | ICD-10-CM | POA: Diagnosis present

## 2022-09-10 DIAGNOSIS — N1832 Chronic kidney disease, stage 3b: Secondary | ICD-10-CM | POA: Diagnosis present

## 2022-09-10 DIAGNOSIS — R600 Localized edema: Secondary | ICD-10-CM | POA: Diagnosis present

## 2022-09-10 LAB — BRAIN NATRIURETIC PEPTIDE: B Natriuretic Peptide: 568.7 pg/mL — ABNORMAL HIGH (ref 0.0–100.0)

## 2022-09-30 ENCOUNTER — Encounter: Payer: Self-pay | Admitting: Family

## 2022-10-01 ENCOUNTER — Encounter: Payer: Self-pay | Admitting: Family

## 2022-10-13 ENCOUNTER — Encounter: Payer: Medicare PPO | Admitting: Family

## 2022-10-14 ENCOUNTER — Encounter: Payer: Self-pay | Admitting: Family

## 2022-10-14 ENCOUNTER — Ambulatory Visit: Payer: Medicare PPO | Attending: Family | Admitting: Family

## 2022-10-14 ENCOUNTER — Other Ambulatory Visit (HOSPITAL_COMMUNITY): Payer: Self-pay

## 2022-10-14 VITALS — BP 122/74 | HR 69 | Wt 146.1 lb

## 2022-10-14 DIAGNOSIS — J449 Chronic obstructive pulmonary disease, unspecified: Secondary | ICD-10-CM

## 2022-10-14 DIAGNOSIS — I480 Paroxysmal atrial fibrillation: Secondary | ICD-10-CM | POA: Diagnosis not present

## 2022-10-14 DIAGNOSIS — I130 Hypertensive heart and chronic kidney disease with heart failure and stage 1 through stage 4 chronic kidney disease, or unspecified chronic kidney disease: Secondary | ICD-10-CM | POA: Diagnosis present

## 2022-10-14 DIAGNOSIS — I10 Essential (primary) hypertension: Secondary | ICD-10-CM

## 2022-10-14 DIAGNOSIS — I5032 Chronic diastolic (congestive) heart failure: Secondary | ICD-10-CM | POA: Diagnosis not present

## 2022-10-14 DIAGNOSIS — M353 Polymyalgia rheumatica: Secondary | ICD-10-CM | POA: Diagnosis not present

## 2022-10-14 DIAGNOSIS — Z87891 Personal history of nicotine dependence: Secondary | ICD-10-CM | POA: Diagnosis not present

## 2022-10-14 DIAGNOSIS — I2510 Atherosclerotic heart disease of native coronary artery without angina pectoris: Secondary | ICD-10-CM | POA: Insufficient documentation

## 2022-10-14 DIAGNOSIS — Z79899 Other long term (current) drug therapy: Secondary | ICD-10-CM | POA: Diagnosis not present

## 2022-10-14 DIAGNOSIS — N189 Chronic kidney disease, unspecified: Secondary | ICD-10-CM | POA: Diagnosis not present

## 2022-10-14 MED ORDER — EMPAGLIFLOZIN 10 MG PO TABS: 10.0000 mg | ORAL_TABLET | Freq: Every day | ORAL | 5 refills | Status: DC

## 2022-10-14 NOTE — Progress Notes (Signed)
Patient ID: Caleb Khan, male    DOB: Mar 16, 1928, 89 y.o.   MRN: [id number]  HPI  Caleb Khan is a 89 y/o male with a history of PAF, CAD, HTN, CKD, anemia, COPD, polymyalgia rheumatica, spinal stenosis, previous tobacco use and chronic heart failure.   Echo 08/21/22 showed an EF of 65-70% along with moderately elevated PA pressure and mild Caleb.   LHC done 09/15/18 showed: Ost LAD to Prox LAD lesion is 40% stenosed. Mid LAD lesion is 25% stenosed. Ost 1st Diag lesion is 75% stenosed.  Conclusion Normal overall left ventricular function Moderate coronary disease in the proximal LAD of around 50% High-grade lesion in the medium-sized diagonal of about 75% bifurcation lesion Normal right coronary artery moderate in size Negative IFR of proximal LAD lesion 0.95 was measured  Admitted 08/21/22 due to lower extremity swelling, shortness of breath with exertion and possible urinary retention. IVF and IV lasix given.   He presents today for a HF follow-up visit with a chief complaint of moderate fatigue w/ minimal exertion. Chronic in nature. Has associated cough (very little), SOB (minimal), light-headedness (off balance), weakness and chronic difficulty sleeping. Denies abdominal distention, palpitations, pedal edema, chest pain or weight gain.   BP has stabilized since stopping amlodipine and losartan. No further edema in his legs  Past Medical History:  Diagnosis Date   Anemia    Anginal pain (HCC)    Arrhythmia    PAF   Arthritis    osteoarthritis   CHF (congestive heart failure) (HCC)    Chronic kidney disease    stage 3   COPD (chronic obstructive pulmonary disease) (HCC)    NO INHALERS   Coronary artery disease    Dyspnea    with exertion   Hypertension    Polymyalgia rheumatica ([place])    TAKES PREDNISONE   Spinal stenosis    Past Surgical History:  Procedure Laterality Date   ANTERIOR APPROACH HEMI HIP ARTHROPLASTY Right 07/09/2021   Procedure: ANTERIOR APPROACH HEMI  HIP ARTHROPLASTY;  Surgeon: Caleb Sheehan, MD;  Location: [facility];  Service: Orthopedics;  Laterality: Right;   CARDIAC CATHETERIZATION Left 05/22/2016   Procedure: Left Heart Cath and Coronary Angiography;  Surgeon: Caleb Kida, MD;  Location: [place] [facility];  Service: Cardiovascular;  Laterality: Left;   EYE SURGERY Bilateral    Cataract Extraction with IOL   GREEN LIGHT LASER TURP (TRANSURETHRAL RESECTION OF PROSTATE N/A 04/26/2018   Procedure: GREEN LIGHT LASER TURP (TRANSURETHRAL RESECTION OF PROSTATE;  Surgeon: Caleb Cowper, MD;  Location: [facility];  Service: Urology;  Laterality: N/A;   INTRAVASCULAR PRESSURE WIRE/FFR STUDY N/A 09/15/2018   Procedure: INTRAVASCULAR PRESSURE WIRE/FFR STUDY;  Surgeon: Caleb Kida, MD;  Location: [place] [facility];  Service: Cardiovascular;  Laterality: N/A;   JOINT REPLACEMENT Right 2004   Shoulder Replacement   JOINT REPLACEMENT Left 2007   Ankle Replacement   LEFT HEART CATH AND CORONARY ANGIOGRAPHY Left 09/15/2018   Procedure: LEFT HEART CATH AND CORONARY ANGIOGRAPHY;  Surgeon: Caleb Kida, MD;  Location: [place] [facility];  Service: Cardiovascular;  Laterality: Left;   PATELLAR TENDON REPAIR Left 07/15/2017   Procedure: PATELLA TENDON REPAIR;  Surgeon: Caleb Knows, MD;  Location: [facility];  Service: Orthopedics;  Laterality: Left;   QUADRICEPS TENDON REPAIR Left 07/15/2017   Procedure: REPAIR QUADRICEP TENDON;  Surgeon: Caleb Knows, MD;  Location: [facility];  Service: Orthopedics;  Laterality: Left;   TONSILLECTOMY  VASECTOMY     Family History  Problem Relation Age of Onset   Hypertension Mother    Social History   Tobacco Use   Smoking status: Former    Packs/day: 2.00    Years: 22.00    Additional pack years: 0.00    Total pack years: 44.00    Types: Cigarettes    Quit date: 05/22/1972    Years since quitting: 50.4   Smokeless tobacco: Never  Substance Use Topics   Alcohol use: Yes     Alcohol/week: 7.0 standard drinks of alcohol    Types: 7 Glasses of wine per week    Comment: daily   Allergies  Allergen Reactions   Benazepril Cough   Prior to Admission medications   Medication Sig Start Date End Date Taking? Authorizing Provider  amiodarone (PACERONE) 100 MG tablet Take 100 mg by mouth daily.   Yes [provider]  apixaban (ELIQUIS) 2.5 MG TABS tablet Take 2.5 mg by mouth 2 (two) times daily.   Yes [provider]  Calcium Carb-Cholecalciferol 500-400 MG-UNIT TABS Take 1 tablet by mouth daily.   Yes [provider]  chlorthalidone (HYGROTON) 25 MG tablet Take 12.5 mg by mouth every other day. 11/10/21  Yes [provider]  docusate sodium (COLACE) 100 MG capsule Take 100 mg by mouth every evening.   Yes [provider]  furosemide (LASIX) 20 MG tablet Take 1 tablet (20 mg total) by mouth daily. 08/24/22  Yes Caleb Mandes, MD  Multiple Vitamins-Minerals (MULTIVITAMIN PO) Take 1 tablet by mouth daily.   Yes [provider]  Multiple Vitamins-Minerals (PRESERVISION AREDS 2 PO) Take 1 capsule by mouth 2 (two) times daily.   Yes [provider]  predniSONE (DELTASONE) 5 MG tablet Take 5 mg by mouth daily.   Yes [provider]  psyllium (HYDROCIL/METAMUCIL) 95 % PACK Take 1 packet by mouth daily.   Yes [provider]  tamsulosin (FLOMAX) 0.4 MG CAPS capsule Take 0.4 mg by mouth daily.   Yes [provider]    Review of Systems  Constitutional:  Positive for fatigue (easily). Negative for appetite change.  HENT:  Positive for postnasal drip. Negative for congestion and sore throat.   Eyes: Negative.   Respiratory:  Positive for cough ("very little") and shortness of breath (minimal). Negative for chest tightness.   Cardiovascular:  Negative for chest pain, palpitations and leg swelling.  Gastrointestinal:  Negative for abdominal distention and abdominal pain.  Endocrine: Negative.    Genitourinary: Negative.   Musculoskeletal:  Negative for back pain and neck pain.  Skin: Negative.   Allergic/Immunologic: Negative.   Neurological:  Positive for weakness and light-headedness. Negative for dizziness.  Hematological:  Negative for adenopathy. Does not bruise/bleed easily.  Psychiatric/Behavioral:  Positive for sleep disturbance (wake up frequently d/t urination; sleeping with oxygen on 2L). Negative for dysphoric mood. The patient is not nervous/anxious.    Vitals:   10/14/22 0952  BP: 122/74  Pulse: 69  SpO2: 98%  Weight: 146 lb 2 oz (66.3 kg)   Wt Readings from Last 3 Encounters:  10/14/22 146 lb 2 oz (66.3 kg)  09/01/22 152 lb (68.9 kg)  08/24/22 158 lb 8.9 oz (71.9 kg)   Lab Results  Component Value Date   CREATININE 1.67 (H) 08/24/2022   CREATININE 1.54 (H) 08/22/2022   CREATININE 1.66 (H) 08/21/2022   Physical Exam Vitals and nursing note reviewed. Exam conducted with a chaperone present (wife).  Constitutional:  Appearance: Normal appearance.  HENT:     Head: Normocephalic and atraumatic.  Cardiovascular:     Rate and Rhythm: Normal rate and regular rhythm.  Pulmonary:     Effort: Pulmonary effort is normal. No respiratory distress.     Breath sounds: No wheezing, rhonchi or rales.  Abdominal:     General: There is no distension.     Palpations: Abdomen is soft.     Tenderness: There is no abdominal tenderness.  Musculoskeletal:        General: No tenderness.     Cervical back: Normal range of motion and neck supple.     Right lower leg: No edema.     Left lower leg: No edema.  Skin:    General: Skin is warm and dry.  Neurological:     General: No focal deficit present.     Mental Status: He is alert and oriented to person, place, and time.  Psychiatric:        Mood and Affect: Mood normal.        Behavior: Behavior normal.        Thought Content: Thought content normal.   Assessment & Plan:  1: Chronic heart failure with  preserved ejection fraction- - NYHA class III - euvolemic  - weighing daily; reminded to call for an overnight weight gain of > 2 pounds or a weekly weight gain of > 5 pounds - weight down 6 pounds from last visit here 6 weeks ago - echo 08/21/22: EF of 65-70% along with moderately elevated PA pressure and mild Caleb.  - LHC 09/15/18: Ost LAD to Prox LAD lesion is 40% stenosed. Mid LAD lesion is 25% stenosed. Ost 1st Diag lesion is 75% stenosed.  Conclusion Normal overall left ventricular function Moderate coronary disease in the proximal LAD of around 50% High-grade lesion in the medium-sized diagonal of about 75% bifurcation lesion Normal right coronary artery moderate in size Negative IFR of proximal LAD lesion 0.95 was measured - not adding salt and has been looking at food labels for sodium content - furosemide 20mg  daily - begin jardiance 10mg  daily and stop taking chlorthalidone - BMP next visit - drinks ~ 4 oz wine nightly  - BNP 08/21/22 was 477.3 - PharmD reconciled meds w/ patient  2: HTN with CKD- - BP 122/74 - no longer taking amlodipine/ losartan due to soft BP's at home - saw PCP Netty Starring) 08/28/22 - saw nephrology ([place]) 08/03/22 - BMP 09/10/22 showed sodium 127, potassium 4.7, creatinine 1.8 & GFR 35  3: PAF- - saw cardiology Clayborn Bigness) 09/10/22 - amiodarone 100mg  QD - apixaban 2.5mg  BID  4: COPD- - saw pulmonology [name]) 08/12/22   Return in 3 weeks, sooner if needed.

## 2022-10-14 NOTE — Progress Notes (Signed)
[facility] - PHARMACIST COUNSELING NOTE  Guideline-Directed Medical Therapy/Evidence Based Medicine  ACE/ARB/ARNI:  None. Not needed at this time. BP well controlled. Beta Blocker:  None. Hx of bradycardia. If further rate control is needed this is an option. Aldosterone Antagonist:  None. Not needed at this time. If further BP control is needed can start. Pt was educated about this medication and may be started in the future.  Diuretic: Furosemide 20 mg daily SGLT2i:  None. Pt would be a good candidate.   Adherence Assessment  Do you ever forget to take your medication? [] Yes [x] No  Do you ever skip doses due to side effects? [] Yes [x] No  Do you have trouble affording your medicines? [] Yes [x] No  Are you ever unable to pick up your medication due to transportation difficulties? [] Yes [x] No  Do you ever stop taking your medications because you don't believe they are helping? [] Yes [x] No  Do you check your weight daily? [x] Yes [] No   Adherence strategy: None identified at this visit.   Barriers to obtaining medications: None.   Vital signs: HR 69, BP 122/74, weight (pounds) 146 ECHO: Date 07/2022, EF 65-70, notes The left ventricle has no regional wall motion abnormalities. The left ventricular internal cavity size was mildly dilated.      Latest Ref Rng & Units 08/24/2022    2:21 AM 08/22/2022    4:46 AM 08/21/2022    9:33 PM  BMP  Glucose 70 - 99 mg/dL 89  83  91   BUN 8 - 23 mg/dL 44  34  37   Creatinine 0.61 - 1.24 mg/dL 1.67  1.54  1.66   Sodium 135 - 145 mmol/L 131  125  123   Potassium 3.5 - 5.1 mmol/L 3.8  4.0  4.3   Chloride 98 - 111 mmol/L 95  94  92   CO2 22 - 32 mmol/L 24  22  22    Calcium 8.9 - 10.3 mg/dL 8.3  8.3  8.2     Past Medical History:  Diagnosis Date   Anemia    Anginal pain (HCC)    Arrhythmia    PAF   Arthritis    osteoarthritis   CHF (congestive heart failure) (HCC)    Chronic kidney disease     stage 3   COPD (chronic obstructive pulmonary disease) (HCC)    NO INHALERS   Coronary artery disease    Dyspnea    with exertion   Hypertension    Polymyalgia rheumatica ([place])    TAKES PREDNISONE   Spinal stenosis     ASSESSMENT 89 year old male who presents to the HF clinic for follow up appointment. No complaints states during our visit. Patient states his weight is stable and is doing well. PMH: afib, CHF, HTN, CKD, COPD.   Recent ED Visit (past 6 months): Date - 08/21/2022, [facility] - Urinary retention and SOB  PLAN CHF/HTN:  Continue lasix 20 mg daily. Educated patient on MRA and SGLT2. Recommend starting jardiance 10 mg daily. Dorian Pod is non-form on insurance. Pt was given a free 30-day coupon.   Afib:  Continue amiodarone and apixaban.   CKD:  Recommend to start jardiance 10 mg daily      Time spent: 40 minutes  Oswald Hillock, Pharm.D. Clinical Pharmacist 10/14/2022 12:26 PM    Current Outpatient Medications:    amiodarone (PACERONE) 100 MG tablet, Take 100 mg by mouth daily., Disp: , Rfl:  apixaban (ELIQUIS) 2.5 MG TABS tablet, Take 2.5 mg by mouth 2 (two) times daily., Disp: , Rfl:    Calcium Carb-Cholecalciferol 500-400 MG-UNIT TABS, Take 1 tablet by mouth daily., Disp: , Rfl:    docusate sodium (COLACE) 100 MG capsule, Take 100 mg by mouth every evening., Disp: , Rfl:    empagliflozin (JARDIANCE) 10 MG TABS tablet, Take 1 tablet (10 mg total) by mouth daily before breakfast., Disp: 30 tablet, Rfl: 5   furosemide (LASIX) 20 MG tablet, Take 1 tablet (20 mg total) by mouth daily., Disp: 30 tablet, Rfl: 0   Multiple Vitamins-Minerals (MULTIVITAMIN PO), Take 1 tablet by mouth daily., Disp: , Rfl:    Multiple Vitamins-Minerals (PRESERVISION AREDS 2 PO), Take 1 capsule by mouth 2 (two) times daily., Disp: , Rfl:    predniSONE (DELTASONE) 5 MG tablet, Take 5 mg by mouth daily., Disp: , Rfl:    psyllium (HYDROCIL/METAMUCIL) 95 % PACK, Take 1 packet by mouth daily., Disp:  , Rfl:    tamsulosin (FLOMAX) 0.4 MG CAPS capsule, Take 0.4 mg by mouth daily., Disp: , Rfl:    COUNSELING POINTS/CLINICAL PEARLS    DRUGS TO CAUTION IN HEART FAILURE  Drug or Class Mechanism  Analgesics NSAIDs COX-2 inhibitors Glucocorticoids  Sodium and water retention, increased systemic vascular resistance, decreased response to diuretics   Diabetes Medications Metformin Thiazolidinediones Rosiglitazone (Avandia) Pioglitazone (Actos) DPP4 Inhibitors Saxagliptin (Onglyza) Sitagliptin (Januvia)   Lactic acidosis Possible calcium channel blockade   Unknown  Antiarrhythmics Class I  Flecainide Disopyramide Class III Sotalol Other Dronedarone  Negative inotrope, proarrhythmic   Proarrhythmic, beta blockade  Negative inotrope  Antihypertensives Alpha Blockers Doxazosin Calcium Channel Blockers Diltiazem Verapamil Nifedipine Central Alpha Adrenergics Moxonidine Peripheral Vasodilators Minoxidil  Increases renin and aldosterone  Negative inotrope    Possible sympathetic withdrawal  Unknown  Anti-infective Itraconazole Amphotericin B  Negative inotrope Unknown  Hematologic Anagrelide Cilostazol   Possible inhibition of PD IV Inhibition of PD III causing arrhythmias  Neurologic/Psychiatric Stimulants Anti-Seizure Drugs Carbamazepine Pregabalin Antidepressants Tricyclics Citalopram Parkinsons Bromocriptine Pergolide Pramipexole Antipsychotics Clozapine Antimigraine Ergotamine Methysergide Appetite suppressants Bipolar Lithium  Peripheral alpha and beta agonist activity  Negative inotrope and chronotrope Calcium channel blockade  Negative inotrope, proarrhythmic Dose-dependent QT prolongation  Excessive serotonin activity/valvular damage Excessive serotonin activity/valvular damage Unknown  IgE mediated hypersensitivy, calcium channel blockade  Excessive serotonin activity/valvular damage Excessive serotonin  activity/valvular damage Valvular damage  Direct myofibrillar degeneration, adrenergic stimulation  Antimalarials Chloroquine Hydroxychloroquine Intracellular inhibition of lysosomal enzymes  Urologic Agents Alpha Blockers Doxazosin Prazosin Tamsulosin Terazosin  Increased renin and aldosterone  Adapted from Page Carleene Overlie, et al. "Drugs That May Cause or Exacerbate Heart Failure: A Scientific Statement from the American Heart  Association." Circulation 2016; 134:e32-e69. DOI: 10.1161/CIR.0000000000000426   MEDICATION ADHERENCES TIPS AND STRATEGIES Taking medication as prescribed improves patient outcomes in heart failure (reduces hospitalizations, improves symptoms, increases survival) Side effects of medications can be managed by decreasing doses, switching agents, stopping drugs, or adding additional therapy. Please let someone in the [facility] know if you have having bothersome side effects so we can modify your regimen. Do not alter your medication regimen without talking to [place].  Medication reminders can help patients remember to take drugs on time. If you are missing or forgetting doses you can try linking behaviors, using pill boxes, or an electronic reminder like an alarm on your phone or an app. Some people can also get automated phone calls as medication reminders.

## 2022-10-14 NOTE — Patient Instructions (Signed)
Begin taking jardiance as 1 tablet every day.   When you start this medication, you will stop taking chlorthalidone.

## 2022-11-04 ENCOUNTER — Other Ambulatory Visit
Admission: RE | Admit: 2022-11-04 | Discharge: 2022-11-04 | Disposition: A | Discharge status: Home / Self Care | Payer: Medicare PPO | Source: Ambulatory Visit | Attending: Family | Admitting: Family

## 2022-11-04 ENCOUNTER — Encounter: Payer: Self-pay | Admitting: Family

## 2022-11-04 ENCOUNTER — Ambulatory Visit (HOSPITAL_BASED_OUTPATIENT_CLINIC_OR_DEPARTMENT_OTHER): Payer: Medicare PPO | Admitting: Family

## 2022-11-04 VITALS — BP 169/70 | HR 65 | Wt 144.6 lb

## 2022-11-04 DIAGNOSIS — I10 Essential (primary) hypertension: Secondary | ICD-10-CM | POA: Diagnosis not present

## 2022-11-04 DIAGNOSIS — I480 Paroxysmal atrial fibrillation: Secondary | ICD-10-CM

## 2022-11-04 DIAGNOSIS — I5032 Chronic diastolic (congestive) heart failure: Secondary | ICD-10-CM | POA: Diagnosis present

## 2022-11-04 DIAGNOSIS — J449 Chronic obstructive pulmonary disease, unspecified: Secondary | ICD-10-CM

## 2022-11-04 LAB — BASIC METABOLIC PANEL
Anion gap: 9 (ref 5–15)
BUN: 40 mg/dL — ABNORMAL HIGH (ref 8–23)
CO2: 24 mmol/L (ref 22–32)
Calcium: 8.6 mg/dL — ABNORMAL LOW (ref 8.9–10.3)
Chloride: 104 mmol/L (ref 98–111)
Creatinine, Ser: 2.11 mg/dL — ABNORMAL HIGH (ref 0.61–1.24)
GFR, Estimated: 29 mL/min — ABNORMAL LOW (ref >60)
Glucose, Bld: 82 mg/dL (ref 70–99)
Potassium: 4.7 mmol/L (ref 3.5–5.1)
Sodium: 137 mmol/L (ref 135–145)

## 2022-11-04 MED ORDER — EMPAGLIFLOZIN 10 MG PO TABS: 10.0000 mg | ORAL_TABLET | Freq: Every day | ORAL | 3 refills | Status: DC

## 2022-11-04 NOTE — Addendum Note (Signed)
Addended by: Clarisa Kindred A on: 11/04/2022 11:09 AM   Modules accepted: Orders

## 2022-11-04 NOTE — Progress Notes (Signed)
Patient ID: Caleb Khan, male    DOB: [birth year], 89 y.o.   MRN: [id number]  Primary cardiologist: Dorothyann Peng, MD (last seen 02/24) PCP: Marisue Ivan, MD (last seen 02/24)  HPI  Caleb Khan is a 89 y/o male with a history of PAF, CAD, HTN, CKD, anemia, COPD, polymyalgia rheumatica, spinal stenosis, previous tobacco use and chronic heart failure.   Echo 08/21/22: EF of 65-70% along with moderately elevated PA pressure and mild Caleb.   LHC 09/15/18: Ost LAD to Prox LAD lesion is 40% stenosed. Mid LAD lesion is 25% stenosed. Ost 1st Diag lesion is 75% stenosed.  Conclusion Normal overall left ventricular function Moderate coronary disease in the proximal LAD of around 50% High-grade lesion in the medium-sized diagonal of about 75% bifurcation lesion Normal right coronary artery moderate in size Negative IFR of proximal LAD lesion 0.95 was measured  Admitted 08/21/22 due to lower extremity swelling, shortness of breath with exertion and possible urinary retention. IVF and IV lasix given.   He presents today for a HF follow-up visit with a chief complaint of moderate fatigue with minimal exertion. Chronic in nature. Has associated cough, SOB and light-headedness with sudden position changes. Denies abdominal distention, palpitations, pedal edema, chest pain or weight gain.   Started on jardiance at last visit and no side effects that he's aware of.   Brought home weight/ BP log for review. Home BP much lower than BP in office today.   Past Medical History:  Diagnosis Date   Anemia    Anginal pain (HCC)    Arrhythmia    PAF   Arthritis    osteoarthritis   CHF (congestive heart failure) (HCC)    Chronic kidney disease    stage 3   COPD (chronic obstructive pulmonary disease) (HCC)    NO INHALERS   Coronary artery disease    Dyspnea    with exertion   Hypertension    Polymyalgia rheumatica (HCC)    TAKES PREDNISONE   Spinal stenosis    Past Surgical History:   Procedure Laterality Date   ANTERIOR APPROACH HEMI HIP ARTHROPLASTY Right 07/09/2021   Procedure: ANTERIOR APPROACH HEMI HIP ARTHROPLASTY;  Surgeon: Lyndle Herrlich, MD;  Location: [facility];  Service: Orthopedics;  Laterality: Right;   CARDIAC CATHETERIZATION Left 05/22/2016   Procedure: Left Heart Cath and Coronary Angiography;  Surgeon: Alwyn Pea, MD;  Location: [facility];  Service: Cardiovascular;  Laterality: Left;   CORONARY PRESSURE/FFR STUDY N/A 09/15/2018   Procedure: INTRAVASCULAR PRESSURE WIRE/FFR STUDY;  Surgeon: Alwyn Pea, MD;  Location: [facility];  Service: Cardiovascular;  Laterality: N/A;   EYE SURGERY Bilateral    Cataract Extraction with IOL   GREEN LIGHT LASER TURP (TRANSURETHRAL RESECTION OF PROSTATE N/A 04/26/2018   Procedure: GREEN LIGHT LASER TURP (TRANSURETHRAL RESECTION OF PROSTATE;  Surgeon: Orson Ape, MD;  Location: [facility];  Service: Urology;  Laterality: N/A;   JOINT REPLACEMENT Right 2004   Shoulder Replacement   JOINT REPLACEMENT Left 2007   Ankle Replacement   LEFT HEART CATH AND CORONARY ANGIOGRAPHY Left 09/15/2018   Procedure: LEFT HEART CATH AND CORONARY ANGIOGRAPHY;  Surgeon: Alwyn Pea, MD;  Location: [facility];  Service: Cardiovascular;  Laterality: Left;   PATELLAR TENDON REPAIR Left 07/15/2017   Procedure: PATELLA TENDON REPAIR;  Surgeon: Kennedy Bucker, MD;  Location: [facility];  Service: Orthopedics;  Laterality: Left;   QUADRICEPS TENDON REPAIR Left 07/15/2017   Procedure:  REPAIR QUADRICEP TENDON;  Surgeon: Kennedy Bucker, MD;  Location: [facility];  Service: Orthopedics;  Laterality: Left;   TONSILLECTOMY     VASECTOMY     Family History  Problem Relation Age of Onset   Hypertension Mother    Social History   Tobacco Use   Smoking status: Former    Packs/day: 2.00    Years: 22.00    Additional pack years: 0.00    Total pack years: 44.00    Types: Cigarettes    Quit date:  05/22/1972    Years since quitting: 50.4   Smokeless tobacco: Never  Substance Use Topics   Alcohol use: Yes    Alcohol/week: 7.0 standard drinks of alcohol    Types: 7 Glasses of wine per week    Comment: daily   Allergies  Allergen Reactions   Benazepril Cough   Prior to Admission medications   Medication Sig Start Date End Date Taking? Authorizing Provider  amiodarone (PACERONE) 100 MG tablet Take 100 mg by mouth daily.   Yes [provider]  apixaban (ELIQUIS) 2.5 MG TABS tablet Take 2.5 mg by mouth 2 (two) times daily.   Yes [provider]  Calcium Carb-Cholecalciferol 500-400 MG-UNIT TABS Take 1 tablet by mouth daily.   Yes [provider]  docusate sodium (COLACE) 100 MG capsule Take 100 mg by mouth every evening.   Yes [provider]  furosemide (LASIX) 20 MG tablet Take 1 tablet (20 mg total) by mouth daily. 08/24/22  Yes Enedina Finner, MD  Multiple Vitamins-Minerals (MULTIVITAMIN PO) Take 1 tablet by mouth daily.   Yes [provider]  Multiple Vitamins-Minerals (PRESERVISION AREDS 2 PO) Take 1 capsule by mouth 2 (two) times daily.   Yes [provider]  predniSONE (DELTASONE) 5 MG tablet Take 5 mg by mouth daily.   Yes [provider]  psyllium (HYDROCIL/METAMUCIL) 95 % PACK Take 1 packet by mouth daily.   Yes [provider]  tamsulosin (FLOMAX) 0.4 MG CAPS capsule Take 0.4 mg by mouth daily.   Yes [provider]  empagliflozin (JARDIANCE) 10 MG TABS tablet Take 1 tablet (10 mg total) by mouth daily before breakfast. 11/04/22   Delma Freeze, FNP   Review of Systems  Constitutional:  Positive for fatigue (easily). Negative for appetite change.  HENT:  Positive for postnasal drip. Negative for congestion and sore throat.   Eyes: Negative.   Respiratory:  Positive for cough ("very little") and shortness of breath (minimal). Negative for chest tightness.   Cardiovascular:  Negative for chest  pain, palpitations and leg swelling.  Gastrointestinal:  Negative for abdominal distention and abdominal pain.  Endocrine: Negative.   Genitourinary: Negative.   Musculoskeletal:  Negative for back pain and neck pain.  Skin: Negative.   Allergic/Immunologic: Negative.   Neurological:  Positive for weakness and light-headedness (with sudden position changes). Negative for dizziness.  Hematological:  Negative for adenopathy. Does not bruise/bleed easily.  Psychiatric/Behavioral:  Positive for sleep disturbance (wake up frequently d/t urination; sleeping with oxygen on 2L). Negative for dysphoric mood. The patient is not nervous/anxious.    Vitals:   11/04/22 0853  BP: (!) 169/70  Pulse: 65  SpO2: 97%  Weight: 144 lb 9.6 oz (65.6 kg)   Wt Readings from Last 3 Encounters:  11/04/22 144 lb 9.6 oz (65.6 kg)  10/14/22 146 lb 2 oz (66.3 kg)  09/01/22 152 lb (68.9 kg)   Lab Results  Component Value Date   CREATININE  1.67 (H) 08/24/2022   CREATININE 1.54 (H) 08/22/2022   CREATININE 1.66 (H) 08/21/2022   Physical Exam Vitals and nursing note reviewed. Exam conducted with a chaperone present (wife).  Constitutional:      Appearance: Normal appearance.  HENT:     Head: Normocephalic and atraumatic.  Cardiovascular:     Rate and Rhythm: Normal rate and regular rhythm.  Pulmonary:     Effort: Pulmonary effort is normal. No respiratory distress.     Breath sounds: No wheezing, rhonchi or rales.  Abdominal:     General: There is no distension.     Palpations: Abdomen is soft.     Tenderness: There is no abdominal tenderness.  Musculoskeletal:        General: No tenderness.     Cervical back: Normal range of motion and neck supple.     Right lower leg: No edema.     Left lower leg: No edema.  Skin:    General: Skin is warm and dry.  Neurological:     General: No focal deficit present.     Mental Status: He is alert and oriented to person, place, and time.  Psychiatric:         Mood and Affect: Mood normal.        Behavior: Behavior normal.        Thought Content: Thought content normal.   Assessment & Plan:  1: Ischemic heart failure with preserved ejection fraction- - NYHA class III - euvolemic  - weighing daily; reminded to call for an overnight weight gain of > 2 pounds or a weekly weight gain of > 5 pounds - weight down 2 pounds from last visit here 3 weeks ago - echo 08/21/22: EF of 65-70% along with moderately elevated PA pressure and mild Caleb.  - LHC 09/15/18: Ost LAD to Prox LAD lesion is 40% stenosed. Mid LAD lesion is 25% stenosed. Ost 1st Diag lesion is 75% stenosed.  Conclusion Normal overall left ventricular function Moderate coronary disease in the proximal LAD of around 50% High-grade lesion in the medium-sized diagonal of about 75% bifurcation lesion Normal right coronary artery moderate in size Negative IFR of proximal LAD lesion 0.95 was measured - not adding salt and has been looking at food labels for sodium content - continue furosemide 20mg  daily - continue jardiance 10mg  daily - BMP today - drinks ~ 4 oz wine nightly  - BNP 08/21/22 was 477.3  2: HTN with CKD- - BP 169/70 in office but 126/72 at home this morning - saw PCP (L[place]) 08/28/22 - saw nephrology (H[place]) 08/03/22 - BMP 09/10/22 showed sodium 127, potassium 4.7, creatinine 1.8 & GFR 35  3: PAF- - saw cardiology Juliann Pares([place]) 09/10/22 - continue amiodarone 100mg  QD - continue apixaban 2.5mg  BID  4: COPD- - saw pulmonology Meredeth Ide([place]) 08/12/22 - wearing oxygen at 2L at bedtime   Due to HF stability, will not make a return appt at this time. Advised him to follow closely with cardiology but that if anything changes in the future, he could call back to make another appt and he was comfortable with this plan.

## 2022-11-04 NOTE — Patient Instructions (Addendum)
I sent your jardiance to [facility] for you.    It was great seeing you today!   Call us in the future if you need [place] for anything.

## 2022-11-05 ENCOUNTER — Encounter: Payer: Self-pay | Admitting: Family

## 2022-11-17 ENCOUNTER — Encounter: Payer: Self-pay | Admitting: Family

## 2022-11-18 ENCOUNTER — Other Ambulatory Visit: Payer: Self-pay | Admitting: Family

## 2022-11-18 ENCOUNTER — Encounter: Payer: Self-pay | Admitting: Family

## 2022-11-18 ENCOUNTER — Other Ambulatory Visit
Admission: RE | Admit: 2022-11-18 | Discharge: 2022-11-18 | Disposition: A | Discharge status: Home / Self Care | Payer: Medicare PPO | Source: Ambulatory Visit | Attending: Family | Admitting: Family

## 2022-11-18 DIAGNOSIS — I5032 Chronic diastolic (congestive) heart failure: Secondary | ICD-10-CM

## 2022-11-18 LAB — BASIC METABOLIC PANEL
Anion gap: 7 (ref 5–15)
BUN: 38 mg/dL — ABNORMAL HIGH (ref 8–23)
CO2: 26 mmol/L (ref 22–32)
Calcium: 8.9 mg/dL (ref 8.9–10.3)
Chloride: 105 mmol/L (ref 98–111)
Creatinine, Ser: 2.07 mg/dL — ABNORMAL HIGH (ref 0.61–1.24)
GFR, Estimated: 29 mL/min — ABNORMAL LOW (ref >60)
Glucose, Bld: 102 mg/dL — ABNORMAL HIGH (ref 70–99)
Potassium: 4 mmol/L (ref 3.5–5.1)
Sodium: 138 mmol/L (ref 135–145)

## 2022-11-18 MED ORDER — EMPAGLIFLOZIN 10 MG PO TABS: 10.0000 mg | ORAL_TABLET | Freq: Every day | ORAL | 3 refills | Status: DC

## 2022-11-18 MED ORDER — LOSARTAN POTASSIUM 50 MG PO TABS: 50.0000 mg | ORAL_TABLET | Freq: Every day | ORAL | 3 refills | Status: AC

## 2022-11-18 NOTE — Progress Notes (Signed)
PCP resumed losartan  daily

## 2022-11-29 ENCOUNTER — Encounter: Payer: Self-pay | Admitting: Family

## 2022-11-30 ENCOUNTER — Other Ambulatory Visit: Payer: Self-pay

## 2022-11-30 MED ORDER — EMPAGLIFLOZIN 10 MG PO TABS: 10.0000 mg | ORAL_TABLET | Freq: Every day | ORAL | 3 refills | Status: AC

## 2022-12-09 ENCOUNTER — Encounter: Payer: Self-pay | Admitting: Family

## 2022-12-09 ENCOUNTER — Other Ambulatory Visit
Admission: RE | Admit: 2022-12-09 | Discharge: 2022-12-09 | Disposition: A | Discharge status: Home / Self Care | Payer: Medicare PPO | Attending: Family | Admitting: Family

## 2022-12-09 DIAGNOSIS — I5032 Chronic diastolic (congestive) heart failure: Secondary | ICD-10-CM | POA: Diagnosis present

## 2022-12-09 LAB — BASIC METABOLIC PANEL
Anion gap: 7 (ref 5–15)
BUN: 40 mg/dL — ABNORMAL HIGH (ref 8–23)
CO2: 26 mmol/L (ref 22–32)
Calcium: 8.8 mg/dL — ABNORMAL LOW (ref 8.9–10.3)
Chloride: 104 mmol/L (ref 98–111)
Creatinine, Ser: 1.86 mg/dL — ABNORMAL HIGH (ref 0.61–1.24)
GFR, Estimated: 34 mL/min — ABNORMAL LOW (ref >60)
Glucose, Bld: 92 mg/dL (ref 70–99)
Potassium: 4.1 mmol/L (ref 3.5–5.1)
Sodium: 137 mmol/L (ref 135–145)

## 2023-10-22 ENCOUNTER — Other Ambulatory Visit
Admission: RE | Admit: 2023-10-22 | Discharge: 2023-10-22 | Disposition: A | Discharge status: Home / Self Care | Attending: Rheumatology | Admitting: Rheumatology

## 2023-10-22 DIAGNOSIS — M150 Primary generalized (osteo)arthritis: Secondary | ICD-10-CM | POA: Insufficient documentation

## 2023-10-22 DIAGNOSIS — M353 Polymyalgia rheumatica: Secondary | ICD-10-CM | POA: Diagnosis present

## 2023-10-22 DIAGNOSIS — M25461 Effusion, right knee: Secondary | ICD-10-CM | POA: Diagnosis present

## 2023-10-22 LAB — SYNOVIAL CELL COUNT + DIFF, W/ CRYSTALS
Eosinophils-Synovial: 0 %
Lymphocytes-Synovial Fld: 13 %
Monocyte-Macrophage-Synovial Fluid: 71 %
Neutrophil, Synovial: 16 %
WBC, Synovial: 2088 /mm3 — ABNORMAL HIGH (ref 0–200)

## 2024-07-11 NOTE — Progress Notes (Signed)
 [facility] HEART FAILURE CLINIC NOTE  Referring clinician: Dr. Florencio  History of Present Illness: Caleb Khan is a 89 y.o. male with HFpEF. His medical history is also notable for non-obstructive coronary disease, atrial fibrillation, and PMR on prednisone  5mg . He is here for a routine follow-up visit.   He has shortness of breath that comes on quickly with minimal exertion, such as getting dressed, and he must rest because of fatigue. He is now on Jardiance . It initially caused pain in the back of his ankle so it was stopped, then restarted without recurrent ankle pain. Since resuming it he has not noted much of a change.  He exercises about 20-30 minutes daily, including walking a quarter mile and doing core and leg strengthening, but back problems limit his activity and leg strength.  He has lost weight from 157 to 132 pounds over the past few years, which he attributes to a low-sodium diet.Caleb Khan His wife is concerned but he reports good appetite and adequate intake, and he denies digestive or bowel problems while taking a stool softener.  He checks his blood pressure at home, which is typically 110-130 mmHg in the mornings. He notes higher readings in clinic, which he attributes to the walk from the parking lot.  Problem List  Date Reviewed: 04/07/2024        ICD-10-CM Priority Class Noted - Resolved Diagnosed   Anemia of chronic disease (Hgb 13.4 - 01/05/24) D63.8   07/16/2023 - Present    Psychophysiological insomnia F51.04   03/16/2023 - Present    Status post-operative repair of closed fracture of right hip (06/2021) - followed by Dr. Leora Z98.890, Z87.81   07/23/2021 - Present    History of atrial fibrillation (01/13/21) on Eliquis  - followed by Dr. Florencio Z86.79   01/28/2021 - Present    Leg weakness, bilateral R29.898   12/29/2018 - Present    CKD (chronic kidney disease) stage 3, (Cr 1.4, GFR 47 - 01/05/24) - followed by Dr. Dennise N18.30   11/15/2018 - Present    DNR (do not  resuscitate) Z66   05/10/2018 - Present    Spinal stenosis of lumbar region without neurogenic claudication M48.061   03/08/2018 - Present    COPD mixed type , unspecified (CMS-HCC) - followed by Dr. Theotis J44.9   12/28/2017 - Present    Benign prostatic hyperplasia with weak urinary stream N40.1, R39.12   12/28/2017 - Present    Medicare annual wellness visit, subsequent 07/16/23 Z00.00   06/15/2016 - Present    Initial Medicare wellness exam: 04/29/12;  Z87.898   01/14/2016 - Present    Polymyalgia rheumatica syndrome on prednisone  - followed by Dr. Tobie M35.3   Unknown - Present    Essential hypertension (Chronic) I10   Unknown - Present    Pure hypercholesterolemia (LDL 87 - 01/05/24) (Chronic) E78.00   Unknown - Present    CAD (coronary artery disease), native coronary artery - followed by Dr. Florencio (Chronic) I25.10   Unknown - Present    RESOLVED: Edema of lower extremity R60.0   12/14/2021 - 07/16/2023    RESOLVED: Fall W19.XXXA   08/13/2021 - 08/28/2021    RESOLVED: Femoral neck fracture (CMS/HHS-HCC) S72.009A   07/07/2021 - 08/28/2021    RESOLVED: Atrial fibrillation with RVR (CMS/HHS-HCC) I48.91   05/02/2021 - 08/28/2021    RESOLVED: Nephropathy induced by other drugs, medicaments and biological substances N14.19   06/29/2019 - 08/28/2021    RESOLVED: Current chronic use of systemic steroids Z79.52  12/29/2018 - 07/27/2019    RESOLVED: Screening for osteoporosis Z13.820   12/29/2018 - 07/27/2019    RESOLVED: Weight loss, non-intentional R63.4   12/29/2018 - 07/27/2019    Overview Signed 12/29/2018 10:27 AM by French Mering, MD  2o lbs past 6 Mo (DEC- 2019/June 2020)      RESOLVED: Abnormal TSH (TSH 3.7 - 05/29/19) - followed by Dr. Damian R79.89   11/15/2018 - 11/13/2020    RESOLVED: Abnormal results of thyroid  function studies R94.6   11/15/2018 - 08/28/2021    RESOLVED: Sepsis (CMS/HHS-HCC) A41.9   03/17/2018 - 11/15/2018    RESOLVED: CKD (chronic kidney disease) stage 3, (Cr 1.4, GFR 48 - 12/21/17)  N18.30   10/15/2016 - 05/10/2018    RESOLVED: Anemia of chronic disease (Hgb 12.7 - 06/24/22) D63.8   10/15/2016 - 01/06/2023    RESOLVED: Pulmonary emphysema (CMS/HHS-HCC) J43.9   06/11/2016 - 08/12/2022    RESOLVED: Vaccine counseling: TDAP vaccine administered on 06/04/17 &  11/27/10 (previous Td- 06/25/05); PNA vaccine administered in 2003? per pt; Zostavax- 2011; PCV-13: 09/12/15 (06/24/17) Z71.85   09/12/2015 - 11/13/2020    RESOLVED: Angina pectoris (CMS-HCC) I20.9   Unknown - 11/15/2018    Overview Signed 06/19/2014  9:41 AM by Valerio Cresencio BROCKS, CMA  HX       Current Outpatient Medications  Medication Sig Dispense Refill   amLODIPine  (NORVASC ) 5 MG tablet Take 1 tablet (5 mg total) by mouth once daily 90 tablet 3   apixaban  (ELIQUIS ) 2.5 mg tablet Take 1 tablet (2.5 mg total) by mouth every 12 (twelve) hours 180 tablet 3   calcium  citrate-vitamin D3 (CITRACAL+D) 315 mg-5 mcg (200 unit) tablet Take 1 tablet by mouth once daily     docusate (COLACE) 100 MG capsule Take 100 mg by mouth once daily     empagliflozin  (JARDIANCE ) 10 mg tablet Take 1 tablet (10 mg total) by mouth once daily 30 tablet 11   FUROsemide  (LASIX ) 20 MG tablet Take 0.5 tablets (10 mg total) by mouth once daily 50 tablet 1   losartan  (COZAAR ) 25 MG tablet TAKE 1 TABLET BY MOUTH ONCE  DAILY 90 tablet 3   multivitamin tablet Take 1 tablet by mouth once daily     nitroGLYcerin  (NITROSTAT ) 0.4 MG SL tablet Place 0.4 mg under the tongue every 5 (five) minutes as needed for Chest pain May take up to 3 doses.     predniSONE  (DELTASONE ) 5 MG tablet Take 1 tablet (5 mg total) by mouth once daily 90 tablet 1   psyllium husk (METAMUCIL ORAL) Take 2 tsp/30 mL by mouth once daily     rosuvastatin  (CRESTOR ) 5 MG tablet Take 1 tablet (5 mg total) by mouth at bedtime 100 tablet 1   tamsulosin  (FLOMAX ) 0.4 mg capsule TAKE 1 CAPSULE BY MOUTH DAILY  1/2 HOUR AFTER THE SAME MEAL  EACH DAY 100 capsule 2   vit  C,E-Zn-coppr-lutein -zeaxan (VIS[facility]) 250-90-40-2-5 mg Cap Take 1 capsule by mouth 2 (two) times daily AREDS 2     No current facility-administered medications for this visit.    Allergies  Allergen Reactions   Benazepril Other (See Comments)    Dry cough   Tramadol -Acetaminophen  Nausea and Headache   SOCIAL HISTORY: He grew up near [place] and previously was an art gallery manager.  REVIEW OF SYSTEMS: A 10-point review of systems is negative except as noted in  the HPI.   PHYSICAL EXAMINATION: BP (!) 152/62   Pulse 52   Ht 160  cm (5' 3)   Wt 61.5 kg (135 lb 9.6 oz)   SpO2 99%   BMI 24.02 kg/m  General Appearance:  Well appearing and in no acute distress during physical examination  HEENT:  JVP at the collarbone when upright   LUNGS:   Clear to auscultation with no rales or rhonchi  CARDIOVASCULAR:  Auscultation:             Normal rate but somewhat irregular              S1: Normal in intensity  S2: Normal in intensity with normal splitting of the second sound  Gallops: None  Murmurs:None   ABDOMEN:   Liver enlargement: No Pulsatile liver: No Ascites: None   PERIPHERAL: Edema: None The extremities feel warm and well-perfused.  SKIN: No lower extremity rashes or ulcers  NEUROLOGIC: Alert, interactive, and appropriate, grossly moving all 4 extremities   IMPRESSION: Mr. Martelle has HFpEF with an elevated RVSP by echo. He has good volume status today and adequate BP control (at home).  I think we can treat for HFpEF and see how he does. We discussed an MRA today but I would like to defer for the moment while he sorts out if he can tolerate Jardiance .  PLAN:   - check BNP tomorrow with routine labs  - continue Jardiance  and will likely trial eplerenone in the future  - return to the [facility] HF clinic in 6 months  Juliene Bile, MD Advanced Heart Failure Team

## 2024-08-22 ENCOUNTER — Emergency Department

## 2024-08-22 ENCOUNTER — Other Ambulatory Visit: Payer: Self-pay

## 2024-08-22 ENCOUNTER — Emergency Department
Admission: EM | Admit: 2024-08-22 | Discharge: 2024-08-22 | Disposition: A | Discharge status: Home / Self Care | Source: Skilled Nursing Facility

## 2024-08-22 DIAGNOSIS — W458XXA Other foreign body or object entering through skin, initial encounter: Secondary | ICD-10-CM | POA: Diagnosis not present

## 2024-08-22 DIAGNOSIS — Z7901 Long term (current) use of anticoagulants: Secondary | ICD-10-CM | POA: Insufficient documentation

## 2024-08-22 DIAGNOSIS — S91331A Puncture wound without foreign body, right foot, initial encounter: Secondary | ICD-10-CM | POA: Diagnosis present

## 2024-08-22 DIAGNOSIS — I509 Heart failure, unspecified: Secondary | ICD-10-CM | POA: Diagnosis not present

## 2024-08-22 DIAGNOSIS — I4891 Unspecified atrial fibrillation: Secondary | ICD-10-CM | POA: Diagnosis not present

## 2024-08-22 DIAGNOSIS — I2510 Atherosclerotic heart disease of native coronary artery without angina pectoris: Secondary | ICD-10-CM | POA: Diagnosis not present

## 2024-08-22 DIAGNOSIS — M79671 Pain in right foot: Secondary | ICD-10-CM

## 2024-08-22 DIAGNOSIS — T148XXA Other injury of unspecified body region, initial encounter: Secondary | ICD-10-CM

## 2024-08-22 MED ORDER — AMLODIPINE BESYLATE 5 MG PO TABS
5.0000 mg | ORAL_TABLET | Freq: Once | ORAL | Status: AC
  Administered 2024-08-22: 5 mg via ORAL
  Filled 2024-08-22: qty 1

## 2024-08-22 MED ORDER — TETANUS-DIPHTH-ACELL PERTUSSIS 5-2-15.5 LF-MCG/0.5 IM SUSP
0.5000 mL | Freq: Once | INTRAMUSCULAR | Status: DC
  Filled 2024-08-22: qty 0.5

## 2024-08-22 NOTE — ED Triage Notes (Signed)
 Pt comes from [facility] via [facility] [facility] [employer] [facility]. Pt stepped on unknown object on right foot which caused bleeding. Bleeding controlled by EMS arrival. EMS states pt was in a fib with rate 130-150s. Known hx of a fib and pt on eliquis .  EMS vitals BP 147/87 99% RA CBG 118 Received 500 cc of NS

## 2024-08-22 NOTE — ED Notes (Signed)
 Pt's foot soaked in NS bath and dried.

## 2024-08-22 NOTE — ED Provider Notes (Signed)
 "  [facility] Provider Note    Event Date/Time   First MD Initiated Contact with Patient 08/22/24 [phone]     (approximate)   History   Laceration   HPI  Caleb Khan is a 89 y.o. male with past medical history of CHF, nonobstructive CAD, A-fib on Eliquis , PMR on 5 mg of prednisone  daily who presents from independent living at [facility] with right foot pain and bleeding.  Patient woke up this morning and was walking around his unit and stepped on an unknown object and subsequently had pain and noticed a copious amount of bleeding.  EMS was called who brought him to our facility.  They did place a pressure dressing en route.  Patient has not taken any of his morning medications today including his home amlodipine .  His last tetanus shot was 2 years ago.  He last took his Eliquis  yesterday evening.  He denies any sensation changes and this is his only issue.  In particular he denies any chest pain shortness of breath palpitations abdominal pain changes in urinary or bowel habits        Physical Exam   Triage Vital Signs: ED Triage Vitals  Encounter Vitals Group     BP 08/22/24 0800 138/79     Girls Systolic BP Percentile --      Girls Diastolic BP Percentile --      Boys Systolic BP Percentile --      Boys Diastolic BP Percentile --      Pulse Rate 08/22/24 0800 81     Resp 08/22/24 0800 20     Temp --      Temp src --      SpO2 --      Weight 08/22/24 0803 150 lb (68 kg)     Height --      Head Circumference --      Peak Flow --      Pain Score 08/22/24 0803 1     Pain Loc --      Pain Education --      Exclude from Growth Chart --     Most recent vital signs: Vitals:   08/22/24 0821 08/22/24 0849  BP: 138/79   Pulse:    Resp:    Temp:  (!) 96.1 F (35.6 C)  SpO2:      Nursing Triage Note reviewed. Vital signs reviewed and patients oxygen saturation is normoxic  General: Patient is well nourished, well developed, awake and alert,  resting comfortably in no acute distress Head: Normocephalic and atraumatic Eyes: Normal inspection, extraocular muscles intact, no conjunctival pallor Ear, nose, throat: Normal external exam Neck: Normal range of motion Respiratory: Patient is in no respiratory distress, lungs CTAB Cardiovascular: Patient is borderline tachycardic, RR GI: Abd SNT with no guarding or rebound  Back: Normal inspection of the back with good strength and range of motion throughout all ext Extremities: pulses intact with good cap refills, no LE pitting edema or calf tenderness  Neuro: The patient is alert and oriented to person, place, and time, appropriately conversive, with 5/5 bilat UE/LE strength, no gross motor or sensory defects noted. Coordination appears to be adequate. Skin: Warm, dry, and intact Psych: normal mood and affect, no SI or HI  ED Results / Procedures / Treatments   Labs (all labs ordered are listed, but only abnormal results are displayed) Labs Reviewed - No data to display   EKG None  RADIOLOGY Xray right foot:  PROCEDURES:  Critical Care performed: No  .Laceration Repair  Date/Time: 08/22/2024 8:55 AM  Performed by: Nicholaus Rolland BRAVO, MD Authorized by: Nicholaus Rolland BRAVO, MD   Consent:    Consent given by:  Patient   Risks discussed:  Infection, need for additional repair, pain and poor cosmetic result Universal protocol:    Imaging studies available: yes     Immediately prior to procedure, a time out was called: yes     Patient identity confirmed:  Verbally with patient Anesthesia:    Anesthesia method:  None Laceration details:    Location:  Foot   Length (cm):  2   Depth (mm):  1 Exploration:    Contaminated: no   Treatment:    Area cleansed with:  Saline   Amount of cleaning:  Standard   Irrigation method:  Pressure wash   Debridement:  None   Undermining:  None Skin repair:    Repair method:  Steri-Strips   Number of Steri-Strips:   2 Approximation:    Approximation:  Loose Repair type:    Repair type:  Simple Post-procedure details:    Dressing:  Adhesive bandage   Procedure completion:  Tolerated well, no immediate complications    MEDICATIONS ORDERED IN ED: Medications  amLODipine  (NORVASC ) tablet 5 mg (5 mg Oral Given 08/22/24 0821)     IMPRESSION / MDM / ASSESSMENT AND PLAN / ED COURSE                                Differential diagnosis includes, but is not limited to, laceration/puncture wound, need for home medications, retained foreign object   This is a very nice 89 year old male who presents with right foot pain and bleeding after stepping at an unknown object.  He is neurovascularly intact in that extremity.  Area was examined which demonstrates a 2 cm linear laceration/puncture wound which is already hemostatic and well-approximated.  Area was cleansed with a copious amount of saline and a water  bath.  An x-ray was obtained to evaluate for possible metallic foreign object.  None was seen.  I placed a point-of-care ultrasound over the wound in 2 dimensions and did not see any retained foreign objects or dermis disruption.  Given that this was a puncture wound I did not recommend any suture repair but instead placed Steri-Strips loosely over the area.  He will monitor for signs of infection.  Of note patient was noted to be in A-fib which she is persistently in and follows with [facility] cardiology.  He did not take his amlodipine  and this was administered and he was subsequently rate controlled.  This is not the reason why he was here.  Patient will hold a one-time morning dose of his Eliquis  but will resume his Eliquis  this evening.  He will administer the rest of his medications when he returns home.  He feels comfortable with discharge at this time   Clinical Course as of 08/22/24 0859  Tue Aug 22, 2024  0840 I do not see any retained foreign bodies on my independent review and interpretation of the x-ray  [HD]  0851 DG Foot Complete Right Radiologist agrees [HD]    Clinical Course User Index [HD] Nicholaus Rolland BRAVO, MD   At time of discharge there is no evidence of acute life, limb, vision, or fertility threat. Patient has stable vital signs, pain is well controlled, patient is ambulatory and p.o. tolerant.  Discharge  instructions were completed using the EPIC system. I would refer you to those at this time. All warnings prescriptions follow-up etc. were discussed in detail with the patient. Patient indicates understanding and is agreeable with this plan. All questions answered.  Patient is made aware that they may return to the emergency department for any worsening or new condition or for any other emergency.   -- Risk: 3 This patient has a high risk of morbidity due to further diagnostic testing or treatment. Rationale: This patients evaluation and management involve a high risk of morbidity due to the potential severity of presenting symptoms, need for diagnostic testing, and/or initiation of treatment that may require close monitoring. The differential includes conditions with potential for significant deterioration or requiring escalation of care. Treatment decisions in the ED, including medication administration, procedural interventions, or disposition planning, reflect this level of risk. COPA: 5 The patient has the following acute or chronic illness/injury that poses a possible threat to life or bodily function: [X] : The patient has a potentially serious acute condition or an acute exacerbation of a chronic illness requiring urgent evaluation and management in the Emergency Department. The clinical presentation necessitates immediate consideration of life-threatening or function-threatening diagnoses, even if they are ultimately ruled out.   FINAL CLINICAL IMPRESSION(S) / ED DIAGNOSES   Final diagnoses:  Puncture wound  Right foot pain  Atrial fibrillation, unspecified type (HCC)      Rx / DC Orders   ED Discharge Orders     None        Note:  This document was prepared using Dragon voice recognition software and may include unintentional dictation errors.   Nicholaus Rolland BRAVO, MD 08/22/24 [phone]  "

## 2024-08-22 NOTE — Discharge Instructions (Signed)
 Try to stay off your foot for the next 6 hours as much as possible.  You might notice some oozing, this is expected.  Do not peel off your Steri-Strips these will fall off on their own.  Monitor for signs of infection.  Please hold your morning dose of Eliquis  but take it this evening.  You already received your amlodipine  this morning please take the rest of your medications as already scheduled -- RETURN PRECAUTIONS & AFTERCARE: (ENGLISH) RETURN PRECAUTIONS: Return immediately to the emergency department or see/call your doctor if you feel worse, weak or have changes in speech or vision, are short of breath, have fever, vomiting, pain, bleeding or dark stool, trouble urinating or any new issues. Return here or see/call your doctor if not improving as expected for your suspected condition. FOLLOW-UP CARE: Call your doctor and/or any doctors we referred you to for more advice and to make an appointment. Do this today, tomorrow or after the weekend. Some doctors only take PPO insurance so if you have HMO insurance you may want to contact your HMO or your regular doctor for referral to a specialist within your plan. Either way tell the doctor's office that it was a referral from the emergency department so you get the soonest possible appointment.  YOUR TEST RESULTS: Take result reports of any blood or urine tests, imaging tests and EKG's to your doctor and any referral doctor. Have any abnormal tests repeated. Your doctor or a referral doctor can let you know when this should be done. Also make sure your doctor contacts this hospital to get any test results that are not currently available such as cultures or special tests for infection and final imaging reports, which are often not available at the time you leave the ER but which may list additional important findings that are not documented on the preliminary report. BLOOD PRESSURE: If your blood pressure was greater than 120/80 have your blood pressure  rechecked within 1 to 2 weeks. MEDICATION SIDE EFFECTS: Do not drive, walk, bike, take the bus, etc. if you have received or are being prescribed any sedating medications such as those for pain or anxiety or certain antihistamines like Benadryl. If you have been give one of these here get a taxi home or have a friend drive you home. Ask your pharmacist to counsel you on potential side effects of any new medication
